# Patient Record
Sex: Male | Born: 1935 | Race: White | Hispanic: No | Marital: Married | Smoking: Former smoker
Health system: Southern US, Community
[De-identification: ages and names within clinical notes are randomized; demographics above are authoritative.]

## PROBLEM LIST (undated history)

## (undated) DIAGNOSIS — I10 Essential (primary) hypertension: Secondary | ICD-10-CM

## (undated) DIAGNOSIS — J449 Chronic obstructive pulmonary disease, unspecified: Secondary | ICD-10-CM

## (undated) DIAGNOSIS — G6181 Chronic inflammatory demyelinating polyneuritis: Secondary | ICD-10-CM

## (undated) DIAGNOSIS — N189 Chronic kidney disease, unspecified: Secondary | ICD-10-CM

## (undated) DIAGNOSIS — K219 Gastro-esophageal reflux disease without esophagitis: Secondary | ICD-10-CM

## (undated) DIAGNOSIS — N200 Calculus of kidney: Secondary | ICD-10-CM

## (undated) DIAGNOSIS — N400 Enlarged prostate without lower urinary tract symptoms: Secondary | ICD-10-CM

## (undated) DIAGNOSIS — J189 Pneumonia, unspecified organism: Secondary | ICD-10-CM

## (undated) DIAGNOSIS — M1990 Unspecified osteoarthritis, unspecified site: Secondary | ICD-10-CM

## (undated) DIAGNOSIS — Z992 Dependence on renal dialysis: Secondary | ICD-10-CM

## (undated) DIAGNOSIS — C801 Malignant (primary) neoplasm, unspecified: Secondary | ICD-10-CM

## (undated) HISTORY — PX: JOINT REPLACEMENT: SHX530

## (undated) HISTORY — PX: ANKLE FUSION: SHX881

## (undated) HISTORY — DX: Chronic kidney disease, unspecified: N18.9

## (undated) HISTORY — DX: Malignant (primary) neoplasm, unspecified: C80.1

## (undated) HISTORY — PX: HIP ARTHROPLASTY: SHX981

## (undated) HISTORY — DX: Unspecified osteoarthritis, unspecified site: M19.90

## (undated) HISTORY — DX: Dependence on renal dialysis: Z99.2

## (undated) HISTORY — DX: Gastro-esophageal reflux disease without esophagitis: K21.9

## (undated) HISTORY — DX: Chronic inflammatory demyelinating polyneuritis: G61.81

## (undated) HISTORY — DX: Chronic obstructive pulmonary disease, unspecified: J44.9

## (undated) HISTORY — DX: Essential (primary) hypertension: I10

## (undated) HISTORY — PX: CHOLECYSTECTOMY: SHX55

## (undated) HISTORY — DX: Benign prostatic hyperplasia without lower urinary tract symptoms: N40.0

---

## 1999-05-11 DIAGNOSIS — C801 Malignant (primary) neoplasm, unspecified: Secondary | ICD-10-CM

## 1999-05-11 HISTORY — PX: NEPHRECTOMY: SHX65

## 1999-05-11 HISTORY — DX: Malignant (primary) neoplasm, unspecified: C80.1

## 2000-06-01 ENCOUNTER — Ambulatory Visit: Admission: RE | Admit: 2000-06-01 | Discharge: 2000-06-01 | Payer: Self-pay | Admitting: Orthopaedic Surgery

## 2000-06-01 ENCOUNTER — Encounter: Payer: Self-pay | Admitting: Orthopaedic Surgery

## 2000-09-12 ENCOUNTER — Encounter (HOSPITAL_COMMUNITY): Admission: RE | Admit: 2000-09-12 | Discharge: 2000-10-12 | Payer: Self-pay | Admitting: Oncology

## 2000-09-12 ENCOUNTER — Encounter: Admission: RE | Admit: 2000-09-12 | Discharge: 2000-09-12 | Payer: Self-pay | Admitting: Oncology

## 2000-10-18 ENCOUNTER — Encounter (HOSPITAL_COMMUNITY): Admission: RE | Admit: 2000-10-18 | Discharge: 2000-11-17 | Payer: Self-pay | Admitting: Oncology

## 2000-10-18 ENCOUNTER — Encounter (HOSPITAL_COMMUNITY): Payer: Self-pay | Admitting: Oncology

## 2000-10-18 ENCOUNTER — Encounter: Admission: RE | Admit: 2000-10-18 | Discharge: 2000-10-18 | Payer: Self-pay | Admitting: Oncology

## 2001-02-27 ENCOUNTER — Encounter: Admission: RE | Admit: 2001-02-27 | Discharge: 2001-02-27 | Payer: Self-pay | Admitting: Oncology

## 2001-05-10 HISTORY — PX: LUNG REMOVAL, PARTIAL: SHX233

## 2001-06-22 ENCOUNTER — Ambulatory Visit (HOSPITAL_COMMUNITY): Admission: RE | Admit: 2001-06-22 | Discharge: 2001-06-22 | Payer: Self-pay | Admitting: Family Medicine

## 2001-06-22 ENCOUNTER — Encounter: Payer: Self-pay | Admitting: Family Medicine

## 2001-06-30 ENCOUNTER — Encounter: Admission: RE | Admit: 2001-06-30 | Discharge: 2001-06-30 | Payer: Self-pay | Admitting: Oncology

## 2001-06-30 ENCOUNTER — Encounter (HOSPITAL_COMMUNITY): Admission: RE | Admit: 2001-06-30 | Discharge: 2001-07-30 | Payer: Self-pay | Admitting: Oncology

## 2001-07-13 ENCOUNTER — Encounter (HOSPITAL_COMMUNITY): Payer: Self-pay | Admitting: Oncology

## 2001-08-04 ENCOUNTER — Encounter: Payer: Self-pay | Admitting: Thoracic Surgery

## 2001-08-04 ENCOUNTER — Inpatient Hospital Stay (HOSPITAL_COMMUNITY): Admission: RE | Admit: 2001-08-04 | Discharge: 2001-08-13 | Payer: Self-pay | Admitting: Thoracic Surgery

## 2001-08-04 ENCOUNTER — Encounter (INDEPENDENT_AMBULATORY_CARE_PROVIDER_SITE_OTHER): Payer: Self-pay | Admitting: *Deleted

## 2001-08-05 ENCOUNTER — Encounter: Payer: Self-pay | Admitting: Thoracic Surgery

## 2001-08-06 ENCOUNTER — Encounter: Payer: Self-pay | Admitting: Thoracic Surgery

## 2001-08-07 ENCOUNTER — Encounter: Payer: Self-pay | Admitting: Thoracic Surgery

## 2001-08-08 ENCOUNTER — Encounter: Payer: Self-pay | Admitting: Thoracic Surgery

## 2001-08-09 ENCOUNTER — Encounter: Payer: Self-pay | Admitting: Thoracic Surgery

## 2001-08-10 ENCOUNTER — Encounter: Payer: Self-pay | Admitting: Thoracic Surgery

## 2001-08-11 ENCOUNTER — Encounter: Payer: Self-pay | Admitting: Thoracic Surgery

## 2001-08-12 ENCOUNTER — Encounter: Payer: Self-pay | Admitting: Thoracic Surgery

## 2001-08-22 ENCOUNTER — Encounter: Admission: RE | Admit: 2001-08-22 | Discharge: 2001-08-22 | Payer: Self-pay | Admitting: Thoracic Surgery

## 2001-08-22 ENCOUNTER — Encounter: Payer: Self-pay | Admitting: Thoracic Surgery

## 2001-09-13 ENCOUNTER — Encounter: Admission: RE | Admit: 2001-09-13 | Discharge: 2001-09-13 | Payer: Self-pay | Admitting: Thoracic Surgery

## 2001-09-13 ENCOUNTER — Encounter: Payer: Self-pay | Admitting: Thoracic Surgery

## 2001-10-20 ENCOUNTER — Encounter: Payer: Self-pay | Admitting: Urology

## 2001-10-20 ENCOUNTER — Ambulatory Visit (HOSPITAL_COMMUNITY): Admission: RE | Admit: 2001-10-20 | Discharge: 2001-10-20 | Payer: Self-pay | Admitting: Urology

## 2001-11-14 ENCOUNTER — Encounter: Admission: RE | Admit: 2001-11-14 | Discharge: 2001-11-14 | Payer: Self-pay | Admitting: Thoracic Surgery

## 2001-11-14 ENCOUNTER — Encounter: Payer: Self-pay | Admitting: Thoracic Surgery

## 2001-11-22 ENCOUNTER — Inpatient Hospital Stay (HOSPITAL_COMMUNITY): Admission: EM | Admit: 2001-11-22 | Discharge: 2001-11-26 | Payer: Self-pay | Admitting: Emergency Medicine

## 2001-11-22 ENCOUNTER — Encounter: Payer: Self-pay | Admitting: Emergency Medicine

## 2001-11-24 ENCOUNTER — Encounter (INDEPENDENT_AMBULATORY_CARE_PROVIDER_SITE_OTHER): Payer: Self-pay | Admitting: *Deleted

## 2001-11-24 ENCOUNTER — Encounter: Payer: Self-pay | Admitting: Internal Medicine

## 2001-11-26 ENCOUNTER — Encounter: Payer: Self-pay | Admitting: Thoracic Surgery

## 2001-12-05 ENCOUNTER — Encounter: Admission: RE | Admit: 2001-12-05 | Discharge: 2001-12-05 | Payer: Self-pay | Admitting: Thoracic Surgery

## 2001-12-05 ENCOUNTER — Encounter: Payer: Self-pay | Admitting: Thoracic Surgery

## 2001-12-19 ENCOUNTER — Encounter: Admission: RE | Admit: 2001-12-19 | Discharge: 2001-12-19 | Payer: Self-pay | Admitting: Oncology

## 2001-12-19 ENCOUNTER — Encounter (HOSPITAL_COMMUNITY): Admission: RE | Admit: 2001-12-19 | Discharge: 2002-01-18 | Payer: Self-pay | Admitting: Oncology

## 2001-12-23 ENCOUNTER — Emergency Department (HOSPITAL_COMMUNITY): Admission: EM | Admit: 2001-12-23 | Discharge: 2001-12-23 | Payer: Self-pay | Admitting: Emergency Medicine

## 2001-12-23 ENCOUNTER — Encounter: Payer: Self-pay | Admitting: Emergency Medicine

## 2001-12-23 ENCOUNTER — Inpatient Hospital Stay (HOSPITAL_COMMUNITY): Admission: EM | Admit: 2001-12-23 | Discharge: 2001-12-28 | Payer: Self-pay | Admitting: *Deleted

## 2002-01-26 ENCOUNTER — Encounter: Admission: RE | Admit: 2002-01-26 | Discharge: 2002-01-26 | Payer: Self-pay | Admitting: Thoracic Surgery

## 2002-01-26 ENCOUNTER — Encounter: Payer: Self-pay | Admitting: Thoracic Surgery

## 2002-04-12 ENCOUNTER — Encounter: Admission: RE | Admit: 2002-04-12 | Discharge: 2002-04-12 | Payer: Self-pay | Admitting: Thoracic Surgery

## 2002-04-12 ENCOUNTER — Encounter: Payer: Self-pay | Admitting: Thoracic Surgery

## 2002-06-20 ENCOUNTER — Encounter (HOSPITAL_COMMUNITY): Admission: RE | Admit: 2002-06-20 | Discharge: 2002-07-20 | Payer: Self-pay | Admitting: Oncology

## 2002-06-20 ENCOUNTER — Encounter: Admission: RE | Admit: 2002-06-20 | Discharge: 2002-06-20 | Payer: Self-pay | Admitting: Oncology

## 2002-07-23 ENCOUNTER — Encounter (HOSPITAL_COMMUNITY): Payer: Self-pay | Admitting: Oncology

## 2002-07-23 ENCOUNTER — Encounter: Admission: RE | Admit: 2002-07-23 | Discharge: 2002-07-23 | Payer: Self-pay | Admitting: Oncology

## 2002-07-23 ENCOUNTER — Encounter (HOSPITAL_COMMUNITY): Admission: RE | Admit: 2002-07-23 | Discharge: 2002-08-22 | Payer: Self-pay | Admitting: Oncology

## 2002-08-10 ENCOUNTER — Encounter: Payer: Self-pay | Admitting: Thoracic Surgery

## 2002-08-10 ENCOUNTER — Encounter: Admission: RE | Admit: 2002-08-10 | Discharge: 2002-08-10 | Payer: Self-pay | Admitting: Thoracic Surgery

## 2002-11-09 ENCOUNTER — Encounter: Payer: Self-pay | Admitting: Thoracic Surgery

## 2002-11-09 ENCOUNTER — Encounter: Admission: RE | Admit: 2002-11-09 | Discharge: 2002-11-09 | Payer: Self-pay | Admitting: Thoracic Surgery

## 2003-01-09 ENCOUNTER — Encounter (HOSPITAL_COMMUNITY): Admission: RE | Admit: 2003-01-09 | Discharge: 2003-02-07 | Payer: Self-pay | Admitting: Oncology

## 2003-01-09 ENCOUNTER — Encounter: Admission: RE | Admit: 2003-01-09 | Discharge: 2003-01-09 | Payer: Self-pay | Admitting: Oncology

## 2003-05-11 HISTORY — PX: COLONOSCOPY: SHX174

## 2003-05-15 ENCOUNTER — Encounter: Admission: RE | Admit: 2003-05-15 | Discharge: 2003-05-15 | Payer: Self-pay | Admitting: Thoracic Surgery

## 2003-07-10 ENCOUNTER — Encounter (HOSPITAL_COMMUNITY): Admission: RE | Admit: 2003-07-10 | Discharge: 2003-08-09 | Payer: Self-pay | Admitting: Oncology

## 2003-07-10 ENCOUNTER — Encounter: Admission: RE | Admit: 2003-07-10 | Discharge: 2003-07-10 | Payer: Self-pay | Admitting: Oncology

## 2003-08-06 ENCOUNTER — Ambulatory Visit (HOSPITAL_COMMUNITY): Admission: RE | Admit: 2003-08-06 | Discharge: 2003-08-06 | Payer: Self-pay | Admitting: Internal Medicine

## 2003-09-17 ENCOUNTER — Encounter: Admission: RE | Admit: 2003-09-17 | Discharge: 2003-09-17 | Payer: Self-pay | Admitting: Thoracic Surgery

## 2003-12-19 ENCOUNTER — Encounter: Admission: RE | Admit: 2003-12-19 | Discharge: 2003-12-19 | Payer: Self-pay | Admitting: Thoracic Surgery

## 2004-06-04 ENCOUNTER — Ambulatory Visit (HOSPITAL_COMMUNITY): Admission: RE | Admit: 2004-06-04 | Discharge: 2004-06-04 | Payer: Self-pay | Admitting: Urology

## 2004-06-16 ENCOUNTER — Encounter: Admission: RE | Admit: 2004-06-16 | Discharge: 2004-06-16 | Payer: Self-pay | Admitting: Thoracic Surgery

## 2004-11-21 ENCOUNTER — Emergency Department (HOSPITAL_COMMUNITY): Admission: EM | Admit: 2004-11-21 | Discharge: 2004-11-21 | Payer: Self-pay | Admitting: Emergency Medicine

## 2004-12-15 ENCOUNTER — Encounter: Admission: RE | Admit: 2004-12-15 | Discharge: 2004-12-15 | Payer: Self-pay | Admitting: Thoracic Surgery

## 2004-12-29 ENCOUNTER — Ambulatory Visit (HOSPITAL_COMMUNITY): Payer: Self-pay | Admitting: Oncology

## 2004-12-29 ENCOUNTER — Encounter: Admission: RE | Admit: 2004-12-29 | Discharge: 2004-12-29 | Payer: Self-pay | Admitting: Oncology

## 2005-01-01 ENCOUNTER — Ambulatory Visit (HOSPITAL_COMMUNITY): Admission: RE | Admit: 2005-01-01 | Discharge: 2005-01-01 | Payer: Self-pay | Admitting: Family Medicine

## 2005-03-22 ENCOUNTER — Ambulatory Visit (HOSPITAL_COMMUNITY): Admission: RE | Admit: 2005-03-22 | Discharge: 2005-03-22 | Payer: Self-pay | Admitting: Urology

## 2005-07-07 ENCOUNTER — Encounter: Admission: RE | Admit: 2005-07-07 | Discharge: 2005-07-07 | Payer: Self-pay | Admitting: Thoracic Surgery

## 2005-10-28 ENCOUNTER — Ambulatory Visit (HOSPITAL_COMMUNITY): Admission: RE | Admit: 2005-10-28 | Discharge: 2005-10-28 | Payer: Self-pay | Admitting: Family Medicine

## 2005-12-29 ENCOUNTER — Encounter: Admission: RE | Admit: 2005-12-29 | Discharge: 2005-12-29 | Payer: Self-pay | Admitting: Oncology

## 2005-12-29 ENCOUNTER — Ambulatory Visit (HOSPITAL_COMMUNITY): Payer: Self-pay | Admitting: Oncology

## 2006-01-05 ENCOUNTER — Encounter: Admission: RE | Admit: 2006-01-05 | Discharge: 2006-01-05 | Payer: Self-pay | Admitting: Thoracic Surgery

## 2006-06-29 ENCOUNTER — Encounter (HOSPITAL_COMMUNITY): Admission: RE | Admit: 2006-06-29 | Discharge: 2006-07-29 | Payer: Self-pay | Admitting: Oncology

## 2006-07-19 ENCOUNTER — Ambulatory Visit: Payer: Self-pay | Admitting: Thoracic Surgery

## 2006-07-19 ENCOUNTER — Encounter: Admission: RE | Admit: 2006-07-19 | Discharge: 2006-07-19 | Payer: Self-pay | Admitting: Thoracic Surgery

## 2006-12-28 ENCOUNTER — Ambulatory Visit (HOSPITAL_COMMUNITY): Payer: Self-pay | Admitting: Oncology

## 2007-02-16 ENCOUNTER — Encounter (HOSPITAL_COMMUNITY): Admission: RE | Admit: 2007-02-16 | Discharge: 2007-03-18 | Payer: Self-pay | Admitting: Oncology

## 2007-07-18 ENCOUNTER — Ambulatory Visit (HOSPITAL_COMMUNITY): Admission: RE | Admit: 2007-07-18 | Discharge: 2007-07-18 | Payer: Self-pay | Admitting: Family Medicine

## 2008-02-23 ENCOUNTER — Ambulatory Visit (HOSPITAL_COMMUNITY): Payer: Self-pay | Admitting: Oncology

## 2008-04-15 ENCOUNTER — Ambulatory Visit (HOSPITAL_COMMUNITY): Payer: Self-pay | Admitting: Oncology

## 2008-04-15 ENCOUNTER — Encounter (HOSPITAL_COMMUNITY): Admission: RE | Admit: 2008-04-15 | Discharge: 2008-05-07 | Payer: Self-pay | Admitting: Oncology

## 2008-07-04 ENCOUNTER — Encounter: Payer: Self-pay | Admitting: Urgent Care

## 2008-11-16 ENCOUNTER — Emergency Department (HOSPITAL_COMMUNITY): Admission: EM | Admit: 2008-11-16 | Discharge: 2008-11-16 | Payer: Self-pay | Admitting: Emergency Medicine

## 2009-01-06 ENCOUNTER — Inpatient Hospital Stay (HOSPITAL_COMMUNITY): Admission: RE | Admit: 2009-01-06 | Discharge: 2009-01-09 | Payer: Self-pay | Admitting: Orthopedic Surgery

## 2009-01-28 ENCOUNTER — Encounter (HOSPITAL_COMMUNITY): Admission: RE | Admit: 2009-01-28 | Discharge: 2009-02-06 | Payer: Self-pay | Admitting: Orthopedic Surgery

## 2009-02-11 ENCOUNTER — Encounter (HOSPITAL_COMMUNITY): Admission: RE | Admit: 2009-02-11 | Discharge: 2009-03-13 | Payer: Self-pay | Admitting: Orthopedic Surgery

## 2009-03-14 ENCOUNTER — Encounter (HOSPITAL_COMMUNITY): Admission: RE | Admit: 2009-03-14 | Discharge: 2009-04-13 | Payer: Self-pay | Admitting: Orthopedic Surgery

## 2009-04-14 ENCOUNTER — Ambulatory Visit (HOSPITAL_COMMUNITY): Payer: Self-pay | Admitting: Oncology

## 2009-09-16 ENCOUNTER — Encounter (HOSPITAL_COMMUNITY): Admission: RE | Admit: 2009-09-16 | Discharge: 2009-10-16 | Payer: Self-pay | Admitting: Oncology

## 2009-09-16 ENCOUNTER — Ambulatory Visit (HOSPITAL_COMMUNITY): Payer: Self-pay | Admitting: Internal Medicine

## 2010-03-23 ENCOUNTER — Ambulatory Visit (HOSPITAL_COMMUNITY): Payer: Self-pay | Admitting: Oncology

## 2010-03-23 ENCOUNTER — Encounter (HOSPITAL_COMMUNITY)
Admission: RE | Admit: 2010-03-23 | Discharge: 2010-04-22 | Payer: Self-pay | Source: Home / Self Care | Attending: Oncology | Admitting: Oncology

## 2010-05-29 ENCOUNTER — Other Ambulatory Visit (HOSPITAL_COMMUNITY): Payer: Self-pay | Admitting: Oncology

## 2010-05-29 DIAGNOSIS — C3490 Malignant neoplasm of unspecified part of unspecified bronchus or lung: Secondary | ICD-10-CM

## 2010-05-30 ENCOUNTER — Encounter: Payer: Self-pay | Admitting: Thoracic Surgery

## 2010-07-21 LAB — PROTEIN ELECTROPHORESIS, SERUM
Gamma Globulin: 16.1 % (ref 11.1–18.8)
M-Spike, %: NOT DETECTED g/dL
Total Protein ELP: 6.4 g/dL (ref 6.0–8.3)

## 2010-07-21 LAB — IMMUNOFIXATION ELECTROPHORESIS
IgA: 250 mg/dL (ref 68–378)
IgG (Immunoglobin G), Serum: 1150 mg/dL (ref 694–1618)
Total Protein ELP: 6.6 g/dL (ref 6.0–8.3)

## 2010-07-21 LAB — PROTEIN, URINE, 24 HOUR
Collection Interval-UPROT: 24 hours
Urine Total Volume-UPROT: 1550 mL

## 2010-07-21 LAB — IMMUNOFIXATION, URINE

## 2010-07-27 LAB — UIFE/LIGHT CHAINS/TP QN, 24-HR UR
Albumin, U: DETECTED
Alpha 1, Urine: DETECTED — AB
Alpha 2, Urine: DETECTED — AB
Beta, Urine: DETECTED — AB
Free Lambda Lt Chains,Ur: 3.29 mg/dL — ABNORMAL HIGH (ref 0.08–1.01)
Total Protein, Urine: 80 mg/dL

## 2010-07-28 LAB — BETA 2 MICROGLOBULIN, SERUM: Beta-2 Microglobulin: 4.87 mg/L — ABNORMAL HIGH (ref 1.01–1.73)

## 2010-07-28 LAB — PROTEIN ELECTROPH W RFLX QUANT IMMUNOGLOBULINS
Albumin ELP: 59 % (ref 55.8–66.1)
Beta 2: 4.4 % (ref 3.2–6.5)
Beta Globulin: 6.5 % (ref 4.7–7.2)
Gamma Globulin: 15.2 % (ref 11.1–18.8)

## 2010-07-28 LAB — COMPREHENSIVE METABOLIC PANEL
AST: 17 U/L (ref 0–37)
Albumin: 3.6 g/dL (ref 3.5–5.2)
BUN: 38 mg/dL — ABNORMAL HIGH (ref 6–23)
Creatinine, Ser: 2.61 mg/dL — ABNORMAL HIGH (ref 0.4–1.5)
GFR calc Af Amer: 29 mL/min — ABNORMAL LOW (ref >60)
Potassium: 4.2 mEq/L (ref 3.5–5.1)
Total Protein: 6.9 g/dL (ref 6.0–8.3)

## 2010-07-28 LAB — IMMUNOFIXATION ELECTROPHORESIS
IgA: 240 mg/dL (ref 68–378)
IgG (Immunoglobin G), Serum: 1060 mg/dL (ref 694–1618)

## 2010-07-28 LAB — IGG, IGA, IGM
IgA: 236 mg/dL (ref 68–378)
IgA: 240 mg/dL (ref 68–378)
IgG (Immunoglobin G), Serum: 1090 mg/dL (ref 694–1618)
IgG (Immunoglobin G), Serum: 1110 mg/dL (ref 694–1618)
IgM, Serum: 85 mg/dL (ref 60–263)
IgM, Serum: 89 mg/dL (ref 60–263)

## 2010-07-28 LAB — IMMUNOFIXATION ADD-ON

## 2010-08-14 LAB — CBC
HCT: 24.4 % — ABNORMAL LOW (ref 39.0–52.0)
MCHC: 34.3 g/dL (ref 30.0–36.0)
MCHC: 34.6 g/dL (ref 30.0–36.0)
MCV: 91.5 fL (ref 78.0–100.0)
Platelets: 176 10*3/uL (ref 150–400)
Platelets: 177 10*3/uL (ref 150–400)
RBC: 2.71 MIL/uL — ABNORMAL LOW (ref 4.22–5.81)
WBC: 11 10*3/uL — ABNORMAL HIGH (ref 4.0–10.5)

## 2010-08-14 LAB — BASIC METABOLIC PANEL
BUN: 31 mg/dL — ABNORMAL HIGH (ref 6–23)
BUN: 32 mg/dL — ABNORMAL HIGH (ref 6–23)
Calcium: 8 mg/dL — ABNORMAL LOW (ref 8.4–10.5)
Creatinine, Ser: 2.77 mg/dL — ABNORMAL HIGH (ref 0.4–1.5)
Creatinine, Ser: 2.79 mg/dL — ABNORMAL HIGH (ref 0.4–1.5)
GFR calc Af Amer: 27 mL/min — ABNORMAL LOW (ref >60)
GFR calc Af Amer: 27 mL/min — ABNORMAL LOW (ref >60)
Potassium: 4.4 mEq/L (ref 3.5–5.1)
Sodium: 132 mEq/L — ABNORMAL LOW (ref 135–145)

## 2010-08-15 LAB — CROSSMATCH: Antibody Screen: NEGATIVE

## 2010-08-15 LAB — URINALYSIS, ROUTINE W REFLEX MICROSCOPIC
Bilirubin Urine: NEGATIVE
Glucose, UA: NEGATIVE mg/dL
Hgb urine dipstick: NEGATIVE
Ketones, ur: NEGATIVE mg/dL
Leukocytes, UA: NEGATIVE
Protein, ur: 100 mg/dL — AB
pH: 5 (ref 5.0–8.0)

## 2010-08-15 LAB — COMPREHENSIVE METABOLIC PANEL
AST: 27 U/L (ref 0–37)
Albumin: 3.8 g/dL (ref 3.5–5.2)
Alkaline Phosphatase: 72 U/L (ref 39–117)
CO2: 24 mEq/L (ref 19–32)
Chloride: 104 mEq/L (ref 96–112)
Creatinine, Ser: 2.31 mg/dL — ABNORMAL HIGH (ref 0.4–1.5)
GFR calc Af Amer: 33 mL/min — ABNORMAL LOW (ref >60)
GFR calc non Af Amer: 28 mL/min — ABNORMAL LOW (ref >60)
Potassium: 4.9 mEq/L (ref 3.5–5.1)
Total Bilirubin: 1 mg/dL (ref 0.3–1.2)

## 2010-08-15 LAB — URINE CULTURE
Colony Count: NO GROWTH
Culture: NO GROWTH

## 2010-08-15 LAB — URINE MICROSCOPIC-ADD ON

## 2010-08-15 LAB — DIFFERENTIAL
Basophils Absolute: 0.1 10*3/uL (ref 0.0–0.1)
Basophils Relative: 1 % (ref 0–1)
Eosinophils Absolute: 0.1 10*3/uL (ref 0.0–0.7)
Eosinophils Relative: 1 % (ref 0–5)
Lymphocytes Relative: 9 % — ABNORMAL LOW (ref 12–46)
Monocytes Absolute: 0.6 10*3/uL (ref 0.1–1.0)

## 2010-08-15 LAB — BASIC METABOLIC PANEL
Chloride: 104 mEq/L (ref 96–112)
GFR calc non Af Amer: 25 mL/min — ABNORMAL LOW (ref >60)
Glucose, Bld: 124 mg/dL — ABNORMAL HIGH (ref 70–99)
Potassium: 4.6 mEq/L (ref 3.5–5.1)
Sodium: 131 mEq/L — ABNORMAL LOW (ref 135–145)

## 2010-08-15 LAB — CBC
HCT: 28.6 % — ABNORMAL LOW (ref 39.0–52.0)
HCT: 38.8 % — ABNORMAL LOW (ref 39.0–52.0)
Hemoglobin: 9.7 g/dL — ABNORMAL LOW (ref 13.0–17.0)
MCV: 91.3 fL (ref 78.0–100.0)
MCV: 91.9 fL (ref 78.0–100.0)
RBC: 4.26 MIL/uL (ref 4.22–5.81)
WBC: 11.7 10*3/uL — ABNORMAL HIGH (ref 4.0–10.5)
WBC: 7.8 10*3/uL (ref 4.0–10.5)

## 2010-08-15 LAB — PROTIME-INR: Prothrombin Time: 13 seconds (ref 11.6–15.2)

## 2010-08-15 LAB — ABO/RH: ABO/RH(D): A NEG

## 2010-09-22 NOTE — Op Note (Signed)
[name], [name] NO.:  [id number]   MEDICAL RECORD NO.:  [id number]          PATIENT TYPE:  INP   LOCATION:  5032                         FACILITY:  [facility]   PHYSICIAN:  Estill Bamberg. Ronnie Derby, M.D. DATE OF BIRTH:  July 09, 1930   DATE OF PROCEDURE:  01/06/2009  DATE OF DISCHARGE:                               OPERATIVE REPORT   SURGEON:  Estill Bamberg. Ronnie Derby, MD   ASSISTANT:  1. Carlynn Spry, PA-C  2. Lowell Guitar. Mercie Eon.   ANESTHESIA:  General.   PREOPERATIVE DIAGNOSIS:  Left knee osteoarthritis.   POSTOPERATIVE DIAGNOSIS:  Left knee osteoarthritis.   PROCEDURE:  Left total knee arthroplasty.   INDICATIONS FOR PROCEDURE:  The patient is a 77 year old white male with  failure of conservative measures for osteoarthritis of the knee.  Informed consent was obtained.   DESCRIPTION OF PROCEDURE:  The patient was laid supine, administered  general anesthesia.  Foley catheter was placed.  The left leg was  prepped and draped in the usual sterile fashion.  A midline incision was  made with a #10 blade.  A new blade was used to make a median  parapatellar arthrotomy and performed a synovectomy.  I then everted the  patella, it measured 35-mm in width and 24-mm in thickness.  I reamed  down 9-mm, drilled through the lug holes with the prosthetic trial in  place, it recreated 25-mm thickness.  I removed the block and went into  flexion.  I then elevated the deep MCL off the medial crest of the  tibia.  I then cut the ACL and the PCL.  I then used extramedullary  alignment system on the tibia to make a perpendicular cut and 3 degrees  posterior slope.  I removed the cut surface of the bone and the guide.  I then made an intramedullary drill hole.  I used an intramedullary  guide to obtain a 6-degree valgus cut.  I then used a 4-in-1 cutting  block, measured to a size F, pinned it in place, made an anteroposterior  chamfer cut.  I then placed a lamina spreader in the knee  and removed  the ACL, PCL, medial and lateral menisci, and posterior condylar  osteophytes.  There was completely worn medial tibial plateau, I knew I  would have to cut a notch.  So, I have tried to obtain ligamentous  balance with a 10-mm spacer.  I then used the intramedullary guide to  cut a medial wedge.  I then fashioned a stubby stem on the revision  tibia with a medial wedge and then trialed that with the F femur and 10  insert, and I had a good flexion-extension gap balance, though a little  tight on the medial side.  I then released further medially, and I had  to go to a 14 insert and that solved the problem well.  I had excellent  flexion-extension gap balance.  I then removed the trial components,  copiously irrigated.  I cemented in the components removing all excess  cement and allowing the cement to harden in extension.  I then closed  the arthrotomy with figure-of-eight #1 Vicryl sutures, deep soft tissues  buried with 0 Vicryl sutures, and the subcuticular with 2-0 Vicryl  stitch.  I placed a Hemovac coming out superolaterally deep to the  arthrotomy, pain catheter coming out superomedial and superficial to the  arthrotomy.   COMPLICATIONS:  None.   DRAINS:  One Hemovac, one pain catheter.   ESTIMATED BLOOD LOSS:  300 mL.           ______________________________  Estill Bamberg. Ronnie Derby, M.D.     SDL/MEDQ  D:  01/07/2009  T:  01/08/2009  Job:  [id number]

## 2010-09-25 NOTE — Op Note (Signed)
NAME:  [name], [name]                          ACCOUNT NO.:  [id number]   MEDICAL RECORD NO.:  [id number]                   PATIENT TYPE:  AMB   LOCATION:  DAY                                  FACILITY:  [facility]   PHYSICIAN:  Hildred Laser, M.D.                 DATE OF BIRTH:  06-May-1931   DATE OF PROCEDURE:  08/06/2003  DATE OF DISCHARGE:                                 OPERATIVE REPORT   PROCEDURE:  Colonoscopy.   INDICATIONS FOR PROCEDURE:  [name] is a 77 year old Caucasian male who is  undergoing screening colonoscopy.  He does not have any GI symptoms.  Family  history is negative for colorectal carcinoma, but personal history is  significant for renal and primary lung carcinoma for which he underwent a  right nephrectomy and left pneumonectomy and remains in remission.  The  procedure and risks were reviewed with the patient, and informed consent was  obtained.   PREOPERATIVE MEDICATIONS:  Demerol 50 mg IV, Versed 3 mg IV.   FINDINGS:  The procedure was performed in the endoscopy suite.  The  patient's vital signs and O2 saturations were monitored during the procedure  and remained stable.  The patient was placed in the left lateral recumbent  position and rectal examination performed.  No abnormality noted on external  or digital exam.  The Olympus videoscope was placed into the rectum and  advanced into the region of the sigmoid colon and beyond.  The preparation  was satisfactory.  He had diverticula scattered throughout his colon, but  most of these were in the sigmoid and descending colon.  The scope was  passed to the cecum which was identified by the appendiceal orifice and  ileocecal valve.  Pictures were taken for the record.  As the scope was  withdrawn, the colonic mucosa was carefully examined.  There were two small  polyps, one at hepatic flexure which was ablated by cold biopsy.  The other  polyp was at the sigmoid colon and was also ablated by cold biopsy.   These  two polyps were submitted separately.  The rectal mucosa was normal.  The  scope was retroflexed to examine the anorectal junction, and moderate-size  hemorrhoids were noted below the dentate line.  The endoscope was  straightened and withdrawn.  The patient tolerated the procedure well.   FINAL DIAGNOSES:  1. Pancolonic diverticulosis.  2. Two small polyps which were ablated by cold biopsy, one from hepatic     flexure and another one from the proximal sigmoid colon.  3. External hemorrhoids.   RECOMMENDATIONS:  1. Standard instructions given.  2. High fiber diet.  3. Citrucel or equivalent, one tablespoon full daily.  4. I will be contacting the patient with the biopsy results and further     recommendations.      ___________________________________________  Hildred Laser, M.D.   NR/MEDQ  D:  08/06/2003  T:  08/06/2003  Job:  [id number]   cc:   Gaston Islam. Neijstrom, MD  [address]  [place]  [place] [address]  Fax: [phone]   Bonne Dolores, M.D.  [address] [place]  [place] [address]  Fax: [phone]

## 2010-09-25 NOTE — Discharge Summary (Signed)
[place]  Patient:    [name], [name] Visit Number: [id number] MRN: [id number]          Service Type: SUR Location: 3A A341 01 Attending Physician:  Glo Herring Dictated by:   Lestine Box, RNFA Admit Date:  11/22/2001 Discharge Date: 11/26/2001   CC:         [name]. [name], M.D.  Primus Bravo, M.D.   Discharge Summary  DATE OF BIRTH: 12/05/1930  ADMISSION DIAGNOSIS: Hemoptysis.  PAST MEDICAL HISTORY: 1. Left lung carcinoma, status post left lower lobe resection in March of 2003    by [name]. 2. Renal cell carcinoma of the right kidney, status post nephrectomy in April    of 2001. 3. Osteoarthritis of the knees and ankles. 4. History of inflammatory demyelinating polyneuropathy in 1992. 5. Benign prostatic hypertrophy. 6. Hypertension. 7. Gastroesophageal reflux disease. 8. History of nephrolithiasis of the left kidney.  PAST SURGICAL HISTORY: 1. Fusion of right ankle by Dr. Beckey Downing in October of 2002. 2. Right knee surgery with tendon repair in 1963.  ALLERGIES: PENICILLIN.  DISCHARGE DIAGNOSES: Hemoptysis probably related to bronchitis.  HISTORY OF PRESENT ILLNESS: [name] is a 77 year old Caucasian male who was recovering nicely from his lung surgery in March. He was seen at the [facility] ten days prior to admission and had a chest x-ray which showed only postoperative changes. He then had an episode of severe reflux and two days later, developed hemoptysis. He sought medical attention at [facility]. They admitted him and performed CT scan. The CT scan revealed a lesion which was suspicious for a lung abscess. He was transferred to [facility] for further evaluation and treatment.  HOSPITAL COURSE: On November 24, 2001 [name] was accepted in transfer from [facility] to [facility] to the care of [name]. [name]. On November 25, 2001 he underwent flexible  operative bronchoscopy. Findings of bronchoscopy were negative for any evidence of abscess or active bleeding. It was [name] impression that his hemoptysis was related to bronchitis. On November 26, 2001, hospital day two, [name] was feeling very well. His vital signs were stable and he was afebrile.  LABORATORY STUDIES: WBC 9.4, hemoglobin and hematocrit 14 and 42, platelets 250. Sodium 138, potassium 4.2, chloride 106, CO2 24, BUN 23, creatinine 2.1, glucose 101.  DIAGNOSTIC STUDIES: Chest x-ray on November 26, 2001 revealed postoperative changes of the left hemithorax as above. No active disease.  DISCHARGE DISPOSITION: [name] feels very well. Only complaint is some rib soreness from coughing. His lung sounds are clear to auscultation and percussion bilaterally.  His breathing is unlabored. [name] is ready for discharge to home. He will continue p.o. antibiotics for the next week.  DISCHARGE CONDITION: Improved.  DISCHARGE INSTRUCTIONS:  MEDICATIONS: 1. Tequin 400 mg one p.o. times seven days. 2. Prednisone 10 mg p.o. q.o.d. 3. Cardura 10 mg p.o. q.d. 4. Aciphex one p.o. q.d.  ACTIVITY: As tolerated.  DIET: Low fat, low salt.  FOLLOW-UP: With [name] in approximately one week. The office will call for a date and time for this appointment. He will be asked to get a chest x-ray one hour before this appointment. Dictated by:   Lestine Box, RNFA Attending Physician:  Glo Herring DD:  11/26/01 TD:  12/01/01 Job: [id number]

## 2010-09-25 NOTE — Discharge Summary (Signed)
[facility]  Patient:    [name], [name] Visit Number: [id number] MRN: [id number]          Service Type: MED Location: 3A A341 01 Attending Physician:  Shaune Pollack Dictated by:   Sinda Du, M.D. Admit Date:  11/22/2001                             Discharge Summary  I have discussed [name] with Dr. Orson Ape and subsequently with Dr. Arlyce Dice. [name] had an x-ray about 10 days ago which did not show changes similar to what we are seeing on the x-ray here and since he has recently had thoracic surgery, I discussed the situation with Dr. Baldemar Friday who did his surgery and Dr. Arlyce Dice agrees to take him in transfer for further evaluation. Dictated by:   Sinda Du, M.D. Attending Physician:  Shaune Pollack DD:  11/24/01 TD:  11/24/01 Job: [id number]

## 2010-09-25 NOTE — Procedures (Signed)
[place]  Patient:    [name], [name] Visit Number: [id number] MRN: [id number]          Service Type: MED Location: 2000 2006 01 Attending Physician:  Mollie Germany Dictated by:   D. Marlyn Corporal, M.D. Admit Date:  11/24/2001 Discharge Date: 11/26/2001                             Procedure Report  PREOPERATIVE DIAGNOSIS:  Hemoptysis, status post left upper lobectomy.  POSTOPERATIVE DIAGNOSIS:  Hemoptysis, status post left upper lobectomy.  OPERATION PERFORMED:  Fiberoptic bronchoscopy.  SURGEON:  D. Marlyn Corporal, M.D.  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  Did well until a couple of weeks ago when he had an episode of reflux and then developed some hemoptysis.  He was admitted.  A CT scan showed a medial questionable abscess.  He was afebrile. Because of his hemoptysis, he is brought to the operating room for fiberoptic bronchoscopy.  DESCRIPTION OF PROCEDURE:  After local anesthesia with Cetacaine, Xylocaine, and IV sedation, the fiberoptic bronchoscope was passed through the mouth, and scored through the midline.  The carina was in the midline.  The left main stem, left upper lobe, and left lower lobe orifices were normal.  On the right side, the right main stem, right upper lobe, and right lower lobe orifices were normal.  We examined on the left side with the bronchial stump of the left upper lobe was normal.  There was some mild bronchitis, but no evidence of any infection or tumor with this bronchoscopy.  The fiberoptic bronchoscope was removed after cultures and brushings for cytology were done.  The patient was returned to recovery in stable condition. Dictated by:   D. Marlyn Corporal, M.D. Attending Physician:  Mollie Germany DD:  11/25/01 TD:  11/29/01 Job: [id number]

## 2010-09-25 NOTE — Consult Note (Signed)
[facility]  Patient:    JORELL, GIBNEY Visit Number: [id number] MRN: [id number]          Service Type: MED Location: 2000 2006 01 Attending Physician:  Mollie Germany Dictated by:   Neil Crouch, P.A.-C. Proc. Date: 11/22/01 Admit Date:  11/24/2001 Discharge Date: 11/26/2001   CC:         Redmond School, M.D.  Hildred Laser, M.D.   Consultation Report  REFERRING PHYSICIAN:  Redmond School, M.D.  REASON FOR CONSULTATION:  "Reflux."  HISTORY OF PRESENT ILLNESS:  The patient is a pleasant 77 year old gentleman with past medical history significant for right renal cell carcinoma, status post right nephrectomy in April of 2001, left lung adenocarcinoma status post VATs with left upper lobectomy in March of 2003 by Dr. Pierre Bali, gastroesophageal reflux disease, inflammatory demyelinating polyneuropathy. He was admitted with diagnosis of cavitary lung mass with hemoptysis.  We were asked to see the patient with regards to reflux.  The patient tells me that he developed small volume hemoptysis on November 15, 2001.  This has progressively worsened and yesterday he coughed up a large amount of red blood mixed with clots.  Today, he continues to cough up some black material.  He was recently seen by Dr. Keturah Barre. Marlyn Corporal about a month ago and he tells me he had a clean bill of health.  He has noted some increase shortness of breath over the past couple of weeks.  He denies any chest pain, weight loss, fever, or chills.  He has a longstanding history of gastroesophageal reflux disease and has been on PPI therapy for the last two to three years.  He is on Aciphex 20 mg daily and rarely has any breakthrough heartburn symptoms.  He notes one evening last week, however, that he woke up with some heartburn and tells me that he "possibly refluxed blood as well as coughed up blood."  He denies any nausea, vomiting, abdominal pain, melena, rectal  bleeding, or weight loss.  He denies any NSAID use.  He was seen in Dr. Purcell Nails Fuscos office yesterday for this and it was arranged to have a chest CT scan done. According to his note, he apparently had a cavitary lung mass seen on CT.  At this time, I do not see a report of this CT scan of the chest in the chart.  LABORATORY DATA:  On admission, his hemoglobin was 14.8, hematocrit 42.3, WBC 8.3, with 88% neutrophils, 7% lymphocytes, platelets 265,000.  PT 12.5, INR 1, creatinine 1.9, BUN 21.  Today, his hemoglobin is 14.7, hematocrit 42.3.  MEDICATIONS: 1. Cardura 2 mg q.d. 2. Prednisone 10 mg q.o.d. 3. Aciphex 20 mg q.d.  CURRENT MEDICATIONS: 1. Cleocin 150 mg IV q.6h. 2. Levaquin 500 mg IV/PV q.d. 3. Aciphex 20 mg q.d.  ALLERGIES:  No known drug allergies.  PAST MEDICAL HISTORY:  History of right renal cell carcinoma, status post right nephrectomy in April of 2001.  Diagnosed with left lung lesion in March of 2003.  Underwent VATs with left upper lobectomy by Dr. Pierre Bali. It revealed adenocarcinoma according to his note.  History of osteoarthritis. History of inflammatory demyelinating polyneuropathy diagnosed in 1992. Benign prostatic hypertrophy.  Hypertension.  Gastroesophageal reflux disease. History of left kidney stone with hydronephrosis and acute renal failure, required stent placement.  He had a cholecystectomy at the time of right nephrectomy for cholecystitis.  Right ankle fusion by Dr. Beckey Downing at [facility] in October  2002.  Right knee surgery with tendon repair in 1963.  The patient has had previous colonoscopies by Dr. Hildred Laser. Results unavailable to me at this time.  FAMILY HISTORY:  No family history of colorectal cancer, stomach cancer, or chronic GI illnesses.  He had a cousin who died of lung cancer.  His father died of a MI at age 58.  SOCIAL HISTORY:  He is married and has no children.  He is retired.  He quit smoking in  1978 after smoking for 38 years.  No alcohol use.  REVIEW OF SYSTEMS:  Please see HPI for GI and pulmonary and general.  Also complains of occasional intermittent diarrhea.  PHYSICAL EXAMINATION:  VITAL SIGNS:  Current is 95.5 (temperature maximum 97.3), pulse 54, respirations 20, blood pressure 160/80.  Height 5 foot 7 inches.  Weight 180 pounds.  GENERAL:  A very pleasant well-developed, well-nourished Caucasian male in no acute distress.  SKIN:  Warm and dry.  No jaundice.  HEENT:  Conjunctivae are pink.  Sclerae are nonicteric.  Oropharyngeal mucosa are moist and pink.  No lesions, erythema, or exudate.  NECK:  No lymphadenopathy, thyromegaly, or carotid bruits.  CHEST:  Lungs reveal diffuse occasional rhonchi.  Good air movement.  CARDIOVASCULAR:  Regular rate and rhythm.  Normal S1 and S2.  No murmurs, rubs, or gallops.  ABDOMEN:  Positive bowel sounds.  Protuberant but symmetrical.  Soft and nontender.  No organomegaly or masses appreciated.  EXTREMITIES:  No cyanosis or edema.  LABORATORY DATA:  As mentioned in HPI.  In addition, sodium 137, potassium 4, BUN 20, creatinine 1.9, glucose 110, amylase 85, lipase 39.  H. pylori pending.  PTT 26.  IMPRESSION:  The patient is a pleasant 77 year old gentleman with past medical history significant for left lung adenocarcinoma, status post video-assisted thoracoscopy with left upper lobectomy by Dr. Pierre Bali in March 2003. He presents now with hemoptysis and reported cavitary lung lesion by CT which was done yesterday.  At this time, the patient does not appear to have any acute GI disease.  He tells me that he has well controlled gastroesophageal reflux disease on Aciphex.  Otherwise, his gastrointestinal review of systems is negative.  I do not suspect that we are dealing with a hematemesis.  RECOMMENDATIONS:  1. Further evaluation of lung lesion as per medicine, pulmonary service. 2. Continue PPI therapy. 3.  If he develops hematemesis or gross melena, would consider EGD at    that point.  DISPOSITION:  I would like to thank Dr. Redmond School for allowing [place] to take part in the care of this patient. Dictated by:   Neil Crouch, P.A.-C. Attending Physician:  Mollie Germany DD:  11/23/01 TD:  11/26/01 Job: [id number]
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
## 2010-09-25 NOTE — H&P (Signed)
[place]  Patient:    [name], [name] Visit Number: [id number] MRN: [id number]          Service Type: REC Location: [facility] Attending Physician:  Burnett Kanaris Dictated by:   Vernard Gambles, P.A. Admit Date:  06/30/2001 Discharge Date: 07/30/2001                           History and Physical  DATE OF BIRTH: 1931/02/25  CHIEF COMPLAINT: Left lung lesion.  HISTORY OF PRESENT ILLNESS: This is a 77 year old Caucasian male, who is referred by Dr. Tressie Stalker for evaluation of a left lung lesion.  He was having a routine CT scan for follow-up of renal cell carcinoma.  The CT of the chest revealed a left lower lobe lesion on July 13, 2001.  The patient is asymptomatic and was referred for surgical consultation.  He denies any cough or sputum production, any fever, chills, or unexplained weight loss.  He does have a history of gastroesophageal reflux disease.  He denies any shortness of breath or dyspnea on exertion.  He denies any paroxysmal nocturnal dyspnea, angina, arrhythmias, or chest discomfort.  He denies any peripheral edema, hemoptysis, symptoms of TIA or CVA, denies any amaurosis fugax, any history of pneumonia or history of pulmonary embolus or deep vein thrombosis.  He just got over a recent bout of bronchitis in February 2003 and now he is okay.  PAST MEDICAL HISTORY:  1. History of renal cell carcinoma of the right kidney, status post     nephrectomy in April 2001.  2. Osteoarthritis of the knees and ankles.  3. History of inflammatory demyelinating polyneuropathy in 1992.  4. Benign prostatic hypertrophy.  5. Hypertension.  6. Gastroesophageal reflux disease.  7. History of nephrolithiasis, left kidney.  PAST SURGICAL HISTORY:  1. Right nephrectomy with cholecystectomy and April 2001.  2. Fusion of right ankle by Dr. Beckey Downing at [facility] in October 2002.  3. Right knee surgery with tendon repair in  1963.  MEDICATIONS:  1. Prednisone 10 mg p.o. every other day.  2. Doxazosin 2 mg p.o. q.d.  3. Aciphex 20 mg p.o. every other day.  ALLERGIES: NKDA.  REVIEW OF SYSTEMS: Please see HPI for significant positives.  Otherwise, the patient denies any history of diabetes mellitus.  He did have a history of kidney trouble after his kidney stone of the left kidney.  Otherwise please see HPI.  FAMILY HISTORY: Mother is deceased at 34 from "old age."  Father is deceased at 21 from myocardial infarction.  Sister is alive with diet continued diabetes mellitus.  One brother deceased from motor vehicle accident.  SOCIAL HISTORY: The patient is married, has no children.  He is retired from [employer] in [place], [place].  Denies any alcohol use. Quit smoking in 1978 after smoking a pack per day for 38 years.  PHYSICAL EXAMINATION:  VITAL SIGNS: Blood pressure 138/86, pulse 88, respirations 18.  GENERAL: This is a 77 year old Caucasian male who is in no acute distress, alert and oriented x3.  HEENT: Head normocephalic, atraumatic.  PERRLA.  EOMI.  Without cataracts, glaucoma, or macular degeneration.  He does wear dentures.  NECK: Supple without JVD, bruits, or lymphadenopathy.  CHEST: Symmetrical on inspiration without wheezes, rhonchi, rales, or lymphadenopathy.  HEART: Regular rate and rhythm without murmurs, rubs, or gallops.  ABDOMEN: Soft, nontender.  Bowel sounds x4 quadrants without masses or bruits. Right  nephrectomy well-healed incision noted on his abdomen.  GU/RECTAL: Deferred.  EXTREMITIES: Without clubbing, cyanosis, or edema or ulcerations.  The right knee demonstrates a well-healed incision and the right ankle demonstrates a well-healed incision.  Temperature warm bilaterally.  He has 2+ carotid, femoral, popliteal, and pedal pulses bilaterally.  NEUROLOGIC: No focal deficits.  His gait is steady.  He walks with a cane. Deep tendon reflexes are  2+ and muscle strength is 2+ bilaterally.  ASSESSMENT: Left lung lesion.  PLAN: The patient is to undergo left VATS/thoracotomy/wedge resection and possible lobectomy at [facility] on August 04, 2001.  Dr. Arlyce Dice has seen and evaluated the patient prior to this admission and has explained the risks and benefits and the procedure, and the patient has agreed to continue.  Dictated by:   Vernard Gambles, P.A. Attending Physician:  Burnett Kanaris DD:  08/01/01 TD:  08/02/01 Job: [id number]
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
## 2010-09-25 NOTE — Discharge Summary (Signed)
. [facility]  Patient:    [name], [name] Visit Number: [id number] MRN: [id number]          Service Type: SUR Location: [id number] Attending Physician:  Mollie Germany Dictated by:   Lestine Box, RNFA Admit Date:  08/04/2001 Discharge Date: 08/13/2001   CC:         Everardo All, M.D.  Bonne Dolores, M.D.   Discharge Summary  DATE OF BIRTH: 02-Nov-1930.  ADMISSION DIAGNOSIS: Left upper lobe lung nodule.  PAST MEDICAL HISTORY: 1. Renal cell carcinoma, status post right nephrectomy, April 2001.    Cholecystectomy at this time also. 2. Demyelinating polyneuropathy, diagnosed in 1992. 3. History of left nephrolithiasis. 4. Osteoarthritis of the knees and ankles. 5. Benign prosthetic hypertrophy. 6. Hypertension. 7. Gastroesophageal reflux disease. 8. Status post right ankle fusion in October 2002 and right knee surgery    tendon repair in 1963.  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 After his nephrectomy, he developed left nephrolithiasis which left him with some renal insufficiency with a baseline creatinine of 2.2. During follow-up by Dr. Tressie Stalker, he was found to have a left upper lobe lung lesion. This was initially followed by CT scan but was found to be enlarging.  Dr. Tressie Stalker referred [name] to Dr. Arlyce Dice and he was evaluated in the [facility] office on August 01, 2001. After examination of the patient and review of the records including review of CT scans, Dr. Arlyce Dice recommended surgical resection of this lesion. The procedure was then discussed with the patient and he agreed to proceed.  PREOPERATIVE PULMONARY FUNCTION TESTS: SVC 3.67, FEV1 2.35.  HOSPITAL COURSE: On August 04, 2001 [name] was electively admitted to [facility] in the care of D. Marlyn Corporal, M.D. He underwent the following  surgical procedure. Left video assisted thoracoscopy, left upper lobectomy with multiple node biopsies.  Intraoperative frozen section pathology returned as adenocarcinoma. He tolerated this procedure well and was transferred in stable condition to the PACU. [name] has remained hemodynamically stable since surgery and his postoperatively course is remarkable for a persistent small air leak. He was switched from wall suction to pneumostat on August 09, 2001. His chest tube was able to be discontinued on August 11, 2001. Chest x-ray on the morning of August 12, 2001 was acceptable.  FINAL PATHOLOGY: Was returned as well to moderately differentiated adenocarcinoma, bronchial alveolar type. His lymph nodes were negative for tumor or metastatic changes.  DISPOSITION: [name] is making very good progress and recovering from his surgery on the morning of August 13, 2001. His vital signs are stable. He is afebrile. His labs were within normal limits. His lungs are clear. His appetite is improving as is his ambulation. His incisions are healing well and his pain is adequately controlled. He is ready for discharge today.  LABORATORY: On August 13, 2001 revealed potassium of 4.6, sodium 134, BUN 33, creatinine 1.9.  DISCHARGE CONDITION:  Improved.  ACTIVITY: He has been asked to continue his breathing exercises and daily walks. He is to refrain from driving and no heavy lifting right now.  DIET: Low fat, low salt.  WOUND CARE: He may shower at home. He has been asked to look at his incision daily. He is to call Dr. Rosaland Lao office if his incisions are red, hot, swollen, draining, or if he has a fever  over 101 degrees.  DISCHARGE MEDICATIONS: Tylox 1-2 p.o. q.4-6h. p.r.n. pain. He has been instructed to resume his home medications of prednisone 10 mg p.o. q.o.d; Doxazosin 2 mg p.o. q.d.; Aciphex 20 mg p.o. q.d.  FOLLOW-UP: He will have an appointment to see Dr. Arlyce Dice in the CVTS office  in approximately one week. The office will call with a date and time for that appointment. He will be asked to get a chest x-ray approximately one hour before that appointment. Dictated by:   Lestine Box, RNFA Attending Physician:  Mollie Germany DD:  08/13/01 TD:  08/14/01 Job: [id number]

## 2010-09-25 NOTE — Discharge Summary (Signed)
   NAME:  [name], [name]                          ACCOUNT NO.:  [id number]   MEDICAL RECORD NO.:  [id number]                   PATIENT TYPE:  INP   LOCATION:  2006                                 FACILITY:  [facility]   PHYSICIAN:  Sherrilee Gilles. Gerarda Fraction, M.D.             DATE OF BIRTH:  1930/10/21   DATE OF ADMISSION:  11/22/2001  DATE OF DISCHARGE:  11/26/2001                                 DISCHARGE SUMMARY   DISCHARGE DIAGNOSES:  1. Hemoptysis.  2. History of renal malignancy.  3. Bronchogenic malignancy.  4. Hypertension.   DISCHARGE MEDICATIONS:  1. Levaquin 500 mg p.o. q.d.  2. Aciphex 20 mg p.o. q.d.  3. Cleocin 300 mg q.i.d.  4. INH 300 mg p.o. q.d.  5. Rifampin 600 mg p.o. q.d.  6. Ethambutol 1200 mg p.o. q.d.  7. Pyrazinamide 500 mg t.i.d.  8. Pyridoxine 50 mg p.o. q.d.  9. Cardura 2 mg p.o. q.d.   SUMMARY:  The patient was admitted with hemoptysis, found to have a lung  lesion that was of concern for active tuberculosis versus a bronchogenic  carcinoma.  Pulmonary and GI consultants were consulted.  He was recently  seen by Dr. Arlyce Dice about a month prior to his admission and given a clean  bill of health status post lung adenocarcinoma status post VATS with left  upper lobectomy resection.  In any case, his acid fast studies were  unrevealing.  He was admitted to isolation.  A discussion ensued between  pulmonary and cardiothoracic surgery and the patient was agreed to be seen  in transfer from the hospital by Dr. Arlyce Dice.                                               Sherrilee Gilles. Gerarda Fraction, M.D.    LJF/MEDQ  D:  01/04/2002  T:  01/05/2002  Job:  [id number]
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
## 2010-09-25 NOTE — Op Note (Signed)
[place]  Patient:    [name], [name] Visit Number: [id number] MRN: [id number]          Service Type: SUR Location: MICU 2112 01 Attending Physician:  Mollie Germany Dictated by:   D. Marlyn Corporal, M.D. Proc. Date: 08/04/01 Admit Date:  08/04/2001   CC:         Gaston Islam. Tressie Stalker, M.D.   Operative Report  PREOPERATIVE DIAGNOSIS: Left upper lobe lesion.  POSTOPERATIVE DIAGNOSIS: Adenocarcinoma of left upper lobe.  OPERATION/PROCEDURE: Left video assisted thoracoscopy with left upper lobectomy.  SURGEON: D. Marlyn Corporal, M.D.  ANESTHESIA: General.  INDICATIONS FOR PROCEDURE: This patient had a previous nephrectomy for renal cell cancer and was followed and found to have a left upper lobe lesion that was slightly increased in size.  It was a highly irregular lesion.  He is brought to the operating room because of the possibility this was lung cancer.  DESCRIPTION OF PROCEDURE: He was turned to the left lateral thoracotomy position and prepped and draped in the usual sterile manner.  A dual lumen tube was inserted.  Two trocar sites were inserted in the anterior and posterior axillary line at the seventh intercostal space.  Two trocars were inserted and a 30 degree scope inserted.  Exploration was carried out and the lesions could be seen in the lingular segment of the left upper lobe, and there was another lesion in the apical segment of the left upper lobe.  A 4 cm incision was made over the fifth intercostal space and dissection was carried down, partially dividing latissimus and reflecting serratus anteriorly, and then in the fifth intercostal space a small TPA was placed.  Dissection was started in the fissure, dissecting down the fissure to expose the lingular branches.  These were doubly ligated with 2-0 silks, clipped and divided, then using the TLC-75 and the EZ-45 stapler the lingula was resected.  Frozen section  revealed adenocarcinoma.  The soft tissue margins were very close, so it was decided to convert to lobectomy.  The posterolateral thoracotomy incision was enlarged 3 cm more and the latissimus was completely divided and serratus reflected anteriorly.  Two TPAs were placed at right angles.  The rest of the major fissure was divided with the TLC-75, exposing a small anterior branch.  This was doubly ligated with 2-0 silk, clipped and divided, and the apical posterior branch was dissected out and stapled with the Autosuture stapler.  Then the superior pulmonary vein branch was dissected out and stapled with the Autosuture stapler.  There were some 10L and 11L nodes which were dissected free from the bronchus and the bronchus was stapled with the TL-30 stapler and divided distally.  The left upper lobe was removed. FocalSeal was applied to the staple lines and the inferior pulmonary ligament was taken down to free up the left lower lobe.  The inferior pulmonary ligament was taken down with electrocautery.  Two chest tubes were placed through the trocar sites and tied in place with 0 silk.  The chest was closed with three pericostals of #1 Vicryl in the muscle layer, 2-0 Vicryl in the subcutaneous tissue, and 3-0 Vicryl as a subcuticular stitch.  The patient was returned to the recovery room in stable condition.  Dictated by:   D. Marlyn Corporal, M.D. Attending Physician:  Mollie Germany DD:  08/04/01 TD:  08/05/01 Job: [id number]

## 2010-09-25 NOTE — H&P (Signed)
[facility]  Patient:    [name], [name] Visit Number: [id number] MRN: [id number]          Service Type: MED Location: 3A [id number] 01 Attending Physician:  Shaune Pollack Dictated by:   Redmond School, M.D. Admit Date:  11/22/2001                           History and Physical  CHIEF COMPLAINT:  Hemoptysis.  HISTORY OF PRESENT ILLNESS:  The patient began having several days November 15, 2001 onset of scant hemoptysis associated with nonpurulent sputum which progressively got worse until the day of office visitation when he was coughing up quarter sized clots.  He denies shortness of breath or chest pain. He has had no fever, rigors, or chills.  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  Maintained on Cardura for hypertension and had marked reflux symptomatology, apparently improved on Aciphex prescribed by Dr. Laural Golden.  He has had prostatic hypertrophy, hiatal hernia.  He is status post lung carcinoma resection March 2003 in [place].  He is also status post right nephrectomy for a renal cell carcinoma in September 01, 1999.  SOCIAL HISTORY:  He discontinued cigarettes in the 1970s.  He denies any alcohol or other use.  FAMILY HISTORY:  Noncontributory.  REVIEW OF SYSTEMS:  As under HPI.  PHYSICAL EXAMINATION  SKIN:  Unremarkable.  NECK:  No JVD or adenopathy.  Supple.  CHEST:  Scattered rhonchi with diminished breath sounds bilaterally.  CARDIAC:  Regular rate and rhythm without murmur, rub, or gallop.  ABDOMEN:  Soft.  No organomegaly or masses.  EXTREMITIES:  Without clubbing, cyanosis, edema.  NEUROLOGIC:  Nonfocal.  LABORATORIES:  Chest x-ray was deferred in lieu of doing a CT of the chest to evaluate source of his hemoptysis in light of his recent lung resection.  A cavitary perihilar left lesion was noted.  The remainder of his laboratories were unrevealing.  ASSESSMENT AND PLAN:  The patient is admitted for  intravenous antibiotics and presumed lung abscess treatment, especially in light of his recent complaints of reflux.  Cavitary carcinoma in less likely in light of the fact that he just had a resection and had an apparently unremarkable chest x-ray about a week ago, according to the patient.  This is not available for review at present.  An acid-fast bacillus must also be considered and cultures will be obtained as well as sputum stains for same in addition to precautionary respiratory isolation.  He is currently stable.  Consultation will be sought with gastrointestinal as well as pulmonary medicine. Dictated by:   Redmond School, M.D. Attending Physician:  Shaune Pollack DD:  11/22/01 TD:  11/24/01 Job: [id number]

## 2010-09-25 NOTE — Consult Note (Signed)
[facility]  Patient:    [name], [name] Visit Number: [id number] MRN: [id number]          Service Type: MED Location: 2000 2006 01 Attending Physician:  Mollie Germany Dictated by:   Sinda Du, M.D. Proc. Date: 11/22/01 Admit Date:  11/24/2001 Discharge Date: 11/26/2001                            Consultation Report  REFERRING PHYSICIAN:  Redmond School, M.D.  REASON FOR CONSULTATION:  Abnormal chest x-ray.  HISTORY OF PRESENT ILLNESS:  This is a 77 year old who had been followed with a left lung lesion.  He apparently had a renal cell carcinoma, ended up with a CT of the chest which showed an abnormal area in his left chest.  He underwent a VATs lobectomy by Dr. Pierre Bali in March of this year.  Since then, he came to the emergency room after having hemoptysis.  He had a CT scan done, which showed a lesion which is cavitary in the left lung now. He denies any particular cough.  Denies any fever or chills and denies any weight loss.  He has multiple risk factors for tuberculosis, however, including his renal carcinoma, the renal cell carcinoma, episode of being on prednisone on multiple occasions.  He does not know of any exposure to TB.  He does also have, however, bouts of what are clearly reflux with regurgitation of gastric contents into his mouth.  He said he woke up several times "choking."  PAST MEDICAL HISTORY:  Positive for the lung resection.  History of a renal cell carcinoma with nephrectomy in April of 2001.  He had a chronic inflammatory demyelinating polyneuropathy, BPH, hypertension, GERD.  He has been on prednisone, Cardura, Aciphex.  FAMILY HISTORY:  His father died in his 7s of a heart attack.  Mother died in her 77s of multiple problems and there is diabetes mellitus in the family.  SOCIAL HISTORY:  He has about a 40-pack-year smoking history and does not use any alcohol.  PHYSICAL  EXAMINATION:  GENERAL:  Well-developed, well-nourished male who is not in any acute distress now.  HEENT:  Pupils are reactive to light and accommodation.  Nose and throat are clear.  Mucus membranes are moist.  CHEST:  Minimal rhonchi.  He does have a chest surgical scar.  HEART:  Regular.  ABDOMEN:  Soft.  EXTREMITIES:  No edema.  ASSESSMENT:  He has an abnormal chest x-ray with a cavitary lesion. Certainly, this is going to need to be treated for tuberculosis and have tuberculosis ruled out.  PLAN:  My plan then is to try to get sputum for TB as mentioned.  I am going to go ahead and start him on four-drug therapy while we are awaiting sputums. I agree with the use of Cleocin.  My inclination is that this is actually a lung abscess rather than TB, however.  Thanks for allowing me to see him with you. Dictated by:   Sinda Du, M.D. Attending Physician:  Mollie Germany DD:  11/23/01 TD:  11/26/01 Job: [id number]

## 2010-10-16 ENCOUNTER — Other Ambulatory Visit (HOSPITAL_COMMUNITY): Payer: Self-pay | Admitting: Oncology

## 2010-10-16 ENCOUNTER — Encounter (HOSPITAL_COMMUNITY): Payer: Medicare Other | Attending: Oncology

## 2010-10-16 DIAGNOSIS — G6181 Chronic inflammatory demyelinating polyneuritis: Secondary | ICD-10-CM | POA: Insufficient documentation

## 2010-10-16 DIAGNOSIS — Z09 Encounter for follow-up examination after completed treatment for conditions other than malignant neoplasm: Secondary | ICD-10-CM | POA: Insufficient documentation

## 2010-10-16 DIAGNOSIS — C649 Malignant neoplasm of unspecified kidney, except renal pelvis: Secondary | ICD-10-CM

## 2010-10-16 DIAGNOSIS — I10 Essential (primary) hypertension: Secondary | ICD-10-CM | POA: Insufficient documentation

## 2010-10-16 DIAGNOSIS — Z85528 Personal history of other malignant neoplasm of kidney: Secondary | ICD-10-CM | POA: Insufficient documentation

## 2010-10-16 DIAGNOSIS — Z79899 Other long term (current) drug therapy: Secondary | ICD-10-CM | POA: Insufficient documentation

## 2010-10-16 DIAGNOSIS — Z85118 Personal history of other malignant neoplasm of bronchus and lung: Secondary | ICD-10-CM | POA: Insufficient documentation

## 2010-10-16 DIAGNOSIS — D472 Monoclonal gammopathy: Secondary | ICD-10-CM | POA: Insufficient documentation

## 2010-10-16 DIAGNOSIS — C3490 Malignant neoplasm of unspecified part of unspecified bronchus or lung: Secondary | ICD-10-CM

## 2010-10-16 LAB — DIFFERENTIAL
Basophils Relative: 0 % (ref 0–1)
Eosinophils Absolute: 0.1 10*3/uL (ref 0.0–0.7)
Eosinophils Relative: 2 % (ref 0–5)
Lymphs Abs: 1 10*3/uL (ref 0.7–4.0)
Monocytes Absolute: 0.6 10*3/uL (ref 0.1–1.0)
Monocytes Relative: 9 % (ref 3–12)
Neutro Abs: 5.5 10*3/uL (ref 1.7–7.7)

## 2010-10-16 LAB — COMPREHENSIVE METABOLIC PANEL
ALT: 11 U/L (ref 0–53)
Albumin: 3.5 g/dL (ref 3.5–5.2)
Alkaline Phosphatase: 95 U/L (ref 39–117)
BUN: 48 mg/dL — ABNORMAL HIGH (ref 6–23)
Chloride: 105 mEq/L (ref 96–112)
GFR calc Af Amer: 23 mL/min — ABNORMAL LOW (ref >60)
Glucose, Bld: 96 mg/dL (ref 70–99)
Potassium: 4.9 mEq/L (ref 3.5–5.1)
Sodium: 137 mEq/L (ref 135–145)
Total Bilirubin: 0.2 mg/dL — ABNORMAL LOW (ref 0.3–1.2)

## 2010-10-16 LAB — CBC
HCT: 34.6 % — ABNORMAL LOW (ref 39.0–52.0)
Hemoglobin: 11.6 g/dL — ABNORMAL LOW (ref 13.0–17.0)
MCH: 30.2 pg (ref 26.0–34.0)
MCHC: 33.5 g/dL (ref 30.0–36.0)
RDW: 13.8 % (ref 11.5–15.5)

## 2010-10-16 LAB — PROTEIN, URINE, 24 HOUR
Protein, 24H Urine: 1751 mg/d — ABNORMAL HIGH (ref 50–100)
Urine Total Volume-UPROT: 1700 mL

## 2010-10-19 LAB — KAPPA/LAMBDA LIGHT CHAINS: Lambda free light chains: 4.5 mg/dL — ABNORMAL HIGH (ref 0.57–2.63)

## 2010-10-20 LAB — MULTIPLE MYELOMA PANEL, SERUM
Albumin ELP: 56 % (ref 55.8–66.1)
Beta 2: 5.2 % (ref 3.2–6.5)
Beta Globulin: 6.1 % (ref 4.7–7.2)
Gamma Globulin: 16.7 % (ref 11.1–18.8)
IgM, Serum: 86 mg/dL (ref 41–251)
M-Spike, %: NOT DETECTED g/dL

## 2010-10-21 ENCOUNTER — Ambulatory Visit (HOSPITAL_COMMUNITY)
Admission: RE | Admit: 2010-10-21 | Discharge: 2010-10-21 | Disposition: A | Discharge status: Home / Self Care | Payer: Medicare Other | Source: Ambulatory Visit | Attending: Oncology | Admitting: Oncology

## 2010-10-21 DIAGNOSIS — Z902 Acquired absence of lung [part of]: Secondary | ICD-10-CM | POA: Insufficient documentation

## 2010-10-21 DIAGNOSIS — C3490 Malignant neoplasm of unspecified part of unspecified bronchus or lung: Secondary | ICD-10-CM | POA: Insufficient documentation

## 2010-10-27 ENCOUNTER — Other Ambulatory Visit (HOSPITAL_COMMUNITY): Payer: Self-pay | Admitting: Oncology

## 2010-10-27 ENCOUNTER — Encounter (HOSPITAL_COMMUNITY): Payer: Medicare Other | Admitting: Oncology

## 2010-10-27 DIAGNOSIS — C3490 Malignant neoplasm of unspecified part of unspecified bronchus or lung: Secondary | ICD-10-CM

## 2010-10-27 DIAGNOSIS — C649 Malignant neoplasm of unspecified kidney, except renal pelvis: Secondary | ICD-10-CM

## 2010-10-27 DIAGNOSIS — D472 Monoclonal gammopathy: Secondary | ICD-10-CM

## 2011-02-11 LAB — COMPREHENSIVE METABOLIC PANEL
ALT: 22 U/L (ref 0–53)
Alkaline Phosphatase: 67 U/L (ref 39–117)
BUN: 35 mg/dL — ABNORMAL HIGH (ref 6–23)
CO2: 25 mEq/L (ref 19–32)
Calcium: 8.8 mg/dL (ref 8.4–10.5)
GFR calc non Af Amer: 25 mL/min — ABNORMAL LOW (ref >60)
Glucose, Bld: 134 mg/dL — ABNORMAL HIGH (ref 70–99)
Sodium: 137 mEq/L (ref 135–145)

## 2011-02-11 LAB — CBC
HCT: 40.2 % (ref 39.0–52.0)
Hemoglobin: 13.6 g/dL (ref 13.0–17.0)
MCHC: 33.8 g/dL (ref 30.0–36.0)
RBC: 4.38 MIL/uL (ref 4.22–5.81)

## 2011-02-11 LAB — DIFFERENTIAL
Basophils Relative: 1 % (ref 0–1)
Eosinophils Absolute: 0.1 10*3/uL (ref 0.0–0.7)
Lymphs Abs: 0.6 10*3/uL — ABNORMAL LOW (ref 0.7–4.0)
Neutrophils Relative %: 83 % — ABNORMAL HIGH (ref 43–77)

## 2011-03-25 ENCOUNTER — Other Ambulatory Visit: Payer: Self-pay

## 2011-03-25 DIAGNOSIS — N184 Chronic kidney disease, stage 4 (severe): Secondary | ICD-10-CM

## 2011-03-25 DIAGNOSIS — Z01810 Encounter for preprocedural cardiovascular examination: Secondary | ICD-10-CM

## 2011-03-26 ENCOUNTER — Encounter: Payer: Self-pay | Admitting: Vascular Surgery

## 2011-04-06 ENCOUNTER — Encounter: Payer: Self-pay | Admitting: Vascular Surgery

## 2011-04-07 ENCOUNTER — Ambulatory Visit (INDEPENDENT_AMBULATORY_CARE_PROVIDER_SITE_OTHER): Payer: Medicare Other | Admitting: Vascular Surgery

## 2011-04-07 ENCOUNTER — Encounter: Payer: Self-pay | Admitting: Vascular Surgery

## 2011-04-07 VITALS — BP 156/83 | HR 76 | Resp 16 | Ht 67.0 in | Wt 168.0 lb

## 2011-04-07 DIAGNOSIS — N184 Chronic kidney disease, stage 4 (severe): Secondary | ICD-10-CM

## 2011-04-07 DIAGNOSIS — G6181 Chronic inflammatory demyelinating polyneuritis: Secondary | ICD-10-CM

## 2011-04-07 DIAGNOSIS — Z01810 Encounter for preprocedural cardiovascular examination: Secondary | ICD-10-CM | POA: Insufficient documentation

## 2011-04-07 HISTORY — DX: Chronic inflammatory demyelinating polyneuritis: G61.81

## 2011-04-07 NOTE — Progress Notes (Signed)
Bilateral UE vein mapping performed @ [facility] 04/07/2011

## 2011-04-07 NOTE — Progress Notes (Signed)
[facility] [place]  Patient name: [name] MRN: [id number] DOB: 1930/07/18 Sex: male  CC: Evaluate for hemodialysis access. Deferred by Dr. Lowanda Foster.  HPI: [name] is a 77 y.o. 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 He also has a history of renal cancer and underwent a nephrectomy in 2001. Patient was referred for evaluation for hemodialysis access. Of note he is right-handed. He has had no recent uremic symptoms. Specifically he denies nausea, vomiting, fatigue, anorexia, palpitations, or shortness of breath.  Patient also has a history of lung cancer and is undergone previous partial lung resection and has some underlying COPD also.  Past Medical History  Diagnosis Date  . Cancer     lung  . Cancer     renal  . BPH (benign prostatic hypertrophy)   . Chronic kidney disease   . Hypertension   . GERD (gastroesophageal reflux disease)   . Arthritis   . COPD (chronic obstructive pulmonary disease)   . Chronic inflammatory demyelinating polyneuropathy 04/07/11    Dx in 1992    History reviewed. No pertinent family history.  SOCIAL HISTORY: History  Substance Use Topics  . Smoking status: Former Smoker    Quit date: 02/04/1977  . Smokeless tobacco: Not on file  . Alcohol Use: Not on file    No Known Allergies  Current Outpatient Prescriptions  Medication Sig Dispense Refill  . alfuzosin (UROXATRAL) 10 MG 24 hr tablet Take 10 mg by mouth daily.        [name] Kitchen amLODipine (NORVASC) 5 MG tablet Take 5 mg by mouth daily.        . calcitRIOL (ROCALTROL) 0.5 MCG capsule Take 0.5 mcg by mouth daily.        . cyclobenzaprine (FLEXERIL) 10 MG tablet Take 10 mg by mouth daily.        [name] Kitchen doxazosin (CARDURA) 4 MG tablet Take 4 mg by mouth at bedtime.        [name] Kitchen lisinopril (PRINIVIL,ZESTRIL) 10 MG tablet Take 10 mg by mouth daily.          REVIEW OF SYSTEMS: Valu.Nieves ] denotes positive finding; [  ] denotes negative  finding CARDIOVASCULAR:  [ ]  chest pain   [ ]  chest pressure   [ ]  palpitations   [ ]  orthopnea   Valu.Nieves ] dyspnea on exertion   [ ]  claudication   [ ]  rest pain   [ ]  DVT   [ ]  phlebitis PULMONARY:   [ ]  productive cough   [ ]  asthma   [ ]  wheezing NEUROLOGIC:   [ ]  weakness  [ ]  paresthesias  [ ]  aphasia  [ ]  amaurosis  [ ]  dizziness HEMATOLOGIC:   [ ]  bleeding problems   [ ]  clotting disorders MUSCULOSKELETAL:  [ ]  joint pain   [ ]  joint swelling [ ]  leg swelling GASTROINTESTINAL: [ ]   blood in stool  [ ]   hematemesis GENITOURINARY:  [ ]   dysuria  [ ]   hematuria PSYCHIATRIC:  [ ]  history of major depression INTEGUMENTARY:  [ ]  rashes  [ ]  ulcers CONSTITUTIONAL:  [ ]  fever   [ ]  chills  PHYSICAL EXAM: Filed Vitals:   04/07/11 1515  BP: 156/83  Pulse: 76  Resp: 16  Height: 5\' 7"  (1.702 m)  Weight: 168 lb (76.204 kg)  SpO2: 94%   Body mass index is 26.31 kg/(m^2). GENERAL: The patient is a well-nourished male,  in no acute distress. The vital signs are documented above. CARDIOVASCULAR: There is a regular rate and rhythm without significant murmur appreciated. Do not detect any carotid bruits. He has palpable femoral pulses and warm well-perfused feet. He has no significant lower extremity swelling. PULMONARY: There is good air exchange bilaterally without wheezing or rales. ABDOMEN: Soft and non-tender with normal pitched bowel sounds. Do not palpate an abdominal aortic aneurysm. MUSCULOSKELETAL: There are no major deformities or cyanosis. NEUROLOGIC: No focal weakness or paresthesias are detected. SKIN: There are no ulcers or rashes noted. PSYCHIATRIC: The patient has a normal affect. Extremity: In the left arm the cephalic vein. Into the into the basilic vein in the upper arm.  DATA:  I have independently interpreted his vein mapping area and this shows it in the left arm the cephalic vein is thrombosed above the antecubital level. However it appears that the cephalic vein empties  into the basilic system also on duplex. The forearm cephalic vein and upper arm basilic vein appear reasonable in size. On the right side the cephalic and basilic veins appear reasonable in size also.  Have also reviewed her records from Dr. Florentina Addison office. The patient recently had problems with hyperkalemia. His potassium was 6.0 on 03/15/2011. He is due for a follow up visit and follow up labs in the near future.  MEDICAL ISSUES: I have recommended placement of a left radiocephalic AV fistula. If this vein were not adequate he could potentially have a basilic vein transposition on the left.I have explained the indications for placement of an AV fistula or AV graft. I've explained that if at all possible we will place an AV fistula.  I have reviewed the risks of placement of an AV fistula including but not limited to: failure of the fistula to mature, need for subsequent interventions, and thrombosis. In addition I have reviewed the potential complications of placement of an AV graft. These risks include, but are not limited to, graft thrombosis, graft infection, wound healing problems, bleeding, arm swelling, and steal syndrome. All the patient's questions were answered and they are agreeable to proceed with surgery.  The patient would like to keep his appointment with Dr. Lowanda Foster in the near future before scheduling surgery. He can then simply call us to schedule his surgery.  [place] [facility] [place] Office: [phone]

## 2011-04-14 ENCOUNTER — Telehealth: Payer: Self-pay | Admitting: *Deleted

## 2011-04-14 NOTE — Procedures (Unsigned)
CEPHALIC VEIN MAPPING  INDICATION:  Chronic kidney disease, stage IV.  HISTORY: Hypertension.  EXAM: The right cephalic vein is compressible.  Diameter measurements range from 0.17 to 0.50 cm.  The right basilic vein is compressible.  Diameter measurements range from 0.52 to 1.1 cm.  The left cephalic vein is not compressible.  Diameter measurements range from 0.19 to 0.44 cm in the compressible segments.  The left basilic vein is compressible.  Diameter measurements range from 0.60 to 1.10 cm.  See attached worksheet for all measurements.  IMPRESSION: 1. Patent right cephalic vein with diameter measurements as described     above. 2. Patent bilateral basilic veins with diameter measurements as     described above. 3. Occlusive thrombus present involving the left mid upper arm to     antecubital fossa segment of the cephalic vein. 4. The remainder of the left cephalic vein appears patent with     diameters as noted on the worksheet.  ___________________________________________ Judeth Cornfield. Scot Dock, M.D.  SH/MEDQ  D:  04/07/2011  T:  04/07/2011  Job:  [id number]

## 2011-04-14 NOTE — Telephone Encounter (Signed)
Spoke with pt to schedule Left AVF for Dr Scot Dock. He said he is seeing Dr Hinda Lenis on 04/30/11 and would schedule surgery after that is he needed to

## 2011-05-12 ENCOUNTER — Other Ambulatory Visit: Payer: Self-pay | Admitting: *Deleted

## 2011-05-18 ENCOUNTER — Encounter (HOSPITAL_COMMUNITY): Payer: Self-pay | Admitting: Pharmacy Technician

## 2011-05-25 ENCOUNTER — Other Ambulatory Visit: Payer: Self-pay

## 2011-05-25 ENCOUNTER — Encounter (HOSPITAL_COMMUNITY): Payer: Self-pay

## 2011-05-25 ENCOUNTER — Encounter (HOSPITAL_COMMUNITY): Payer: Self-pay | Admitting: Pharmacy Technician

## 2011-05-25 ENCOUNTER — Encounter (HOSPITAL_COMMUNITY)
Admission: RE | Admit: 2011-05-25 | Discharge: 2011-05-25 | Disposition: A | Discharge status: Home / Self Care | Payer: Medicare Other | Source: Ambulatory Visit | Attending: Vascular Surgery | Admitting: Vascular Surgery

## 2011-05-25 ENCOUNTER — Ambulatory Visit (HOSPITAL_COMMUNITY)
Admission: RE | Admit: 2011-05-25 | Discharge: 2011-05-25 | Disposition: A | Discharge status: Home / Self Care | Payer: Medicare Other | Source: Ambulatory Visit | Attending: Anesthesiology | Admitting: Anesthesiology

## 2011-05-25 DIAGNOSIS — Z01810 Encounter for preprocedural cardiovascular examination: Secondary | ICD-10-CM | POA: Insufficient documentation

## 2011-05-25 DIAGNOSIS — Z01812 Encounter for preprocedural laboratory examination: Secondary | ICD-10-CM | POA: Insufficient documentation

## 2011-05-25 DIAGNOSIS — Z01818 Encounter for other preprocedural examination: Secondary | ICD-10-CM | POA: Insufficient documentation

## 2011-05-25 LAB — BASIC METABOLIC PANEL
BUN: 48 mg/dL — ABNORMAL HIGH (ref 6–23)
Chloride: 105 mEq/L (ref 96–112)
Creatinine, Ser: 3.1 mg/dL — ABNORMAL HIGH (ref 0.50–1.35)
GFR calc non Af Amer: 18 mL/min — ABNORMAL LOW (ref >90)
Glucose, Bld: 95 mg/dL (ref 70–99)
Sodium: 141 mEq/L (ref 135–145)

## 2011-05-25 LAB — SURGICAL PCR SCREEN
MRSA, PCR: NEGATIVE
Staphylococcus aureus: NEGATIVE

## 2011-05-25 LAB — CBC
Hemoglobin: 11 g/dL — ABNORMAL LOW (ref 13.0–17.0)
MCH: 29.7 pg (ref 26.0–34.0)
MCHC: 32.5 g/dL (ref 30.0–36.0)
RDW: 13.6 % (ref 11.5–15.5)

## 2011-05-25 NOTE — Pre-Procedure Instructions (Signed)
[place]  05/25/2011   Your procedure is scheduled on:  06/01/2011  Report to [place] at 7:30 AM.  Call this number if you have problems the morning of surgery: [phone]   Remember:   Do not eat food:After Midnight.  May have clear liquids: up to 4 Hours before arrival.  Clear liquids include soda, tea, black coffee, apple or grape juice, broth.  Take these medicines the morning of surgery with A SIP OF WATER: amlodipine    Do not wear jewelry, make-up or nail polish.  Do not wear lotions, powders, or perfumes. You may wear deodorant.  Do not shave 48 hours prior to surgery.  Do not bring valuables to the hospital.  Contacts, dentures or bridgework may not be worn into surgery.  Leave suitcase in the car. After surgery it may be brought to your room.  For patients admitted to the hospital, checkout time is 11:00 AM the day of discharge.   Patients discharged the day of surgery will not be allowed to drive home.  Name and phone number of your driver: /w wife  Special Instructions: CHG Shower Use Special Wash: 1/2 bottle night before surgery and 1/2 bottle morning of surgery.   Please read over the following fact sheets that you were given: Pain Booklet, Coughing and Deep Breathing, MRSA Information and Surgical Site Infection Prevention

## 2011-05-31 MED ORDER — DEXTROSE 5 % IV SOLN
1.5000 g | INTRAVENOUS | Status: AC
  Administered 2011-06-01: 1.5 g via INTRAVENOUS
  Filled 2011-05-31: qty 1.5

## 2011-06-01 ENCOUNTER — Encounter (HOSPITAL_COMMUNITY): Payer: Self-pay | Admitting: *Deleted

## 2011-06-01 ENCOUNTER — Ambulatory Visit (HOSPITAL_COMMUNITY): Payer: Medicare Other | Admitting: *Deleted

## 2011-06-01 ENCOUNTER — Other Ambulatory Visit: Payer: Self-pay | Admitting: *Deleted

## 2011-06-01 ENCOUNTER — Encounter (HOSPITAL_COMMUNITY)
Admission: RE | Disposition: A | Discharge status: Home / Self Care | Payer: Self-pay | Source: Ambulatory Visit | Attending: Vascular Surgery

## 2011-06-01 ENCOUNTER — Ambulatory Visit (HOSPITAL_COMMUNITY)
Admission: RE | Admit: 2011-06-01 | Discharge: 2011-06-01 | Disposition: A | Discharge status: Home / Self Care | Payer: Medicare Other | Source: Ambulatory Visit | Attending: Vascular Surgery | Admitting: Vascular Surgery

## 2011-06-01 DIAGNOSIS — I129 Hypertensive chronic kidney disease with stage 1 through stage 4 chronic kidney disease, or unspecified chronic kidney disease: Secondary | ICD-10-CM | POA: Insufficient documentation

## 2011-06-01 DIAGNOSIS — N186 End stage renal disease: Secondary | ICD-10-CM

## 2011-06-01 DIAGNOSIS — N189 Chronic kidney disease, unspecified: Secondary | ICD-10-CM | POA: Insufficient documentation

## 2011-06-01 DIAGNOSIS — Z905 Acquired absence of kidney: Secondary | ICD-10-CM | POA: Insufficient documentation

## 2011-06-01 DIAGNOSIS — J4489 Other specified chronic obstructive pulmonary disease: Secondary | ICD-10-CM | POA: Insufficient documentation

## 2011-06-01 DIAGNOSIS — Z8553 Personal history of malignant neoplasm of renal pelvis: Secondary | ICD-10-CM | POA: Insufficient documentation

## 2011-06-01 DIAGNOSIS — K219 Gastro-esophageal reflux disease without esophagitis: Secondary | ICD-10-CM | POA: Insufficient documentation

## 2011-06-01 DIAGNOSIS — J449 Chronic obstructive pulmonary disease, unspecified: Secondary | ICD-10-CM | POA: Insufficient documentation

## 2011-06-01 DIAGNOSIS — N184 Chronic kidney disease, stage 4 (severe): Secondary | ICD-10-CM

## 2011-06-01 HISTORY — PX: AV FISTULA PLACEMENT: SHX1204

## 2011-06-01 LAB — POCT I-STAT 4, (NA,K, GLUC, HGB,HCT)
Glucose, Bld: 111 mg/dL — ABNORMAL HIGH (ref 70–99)
HCT: 35 % — ABNORMAL LOW (ref 39.0–52.0)
Potassium: 4.2 mEq/L (ref 3.5–5.1)
Sodium: 141 mEq/L (ref 135–145)

## 2011-06-01 SURGERY — ARTERIOVENOUS (AV) FISTULA CREATION
Anesthesia: Monitor Anesthesia Care | Site: Arm Lower | Laterality: Left | Wound class: Clean

## 2011-06-01 MED ORDER — LIDOCAINE HCL (PF) 1 % IJ SOLN
INTRAMUSCULAR | Status: DC | PRN
  Administered 2011-06-01: 30 mL

## 2011-06-01 MED ORDER — HEPARIN SODIUM (PORCINE) 5000 UNIT/ML IJ SOLN
INTRAMUSCULAR | Status: DC | PRN
  Administered 2011-06-01: 08:00:00

## 2011-06-01 MED ORDER — ONDANSETRON HCL 4 MG/2ML IJ SOLN: 4.0000 mg | Freq: Once | INTRAMUSCULAR | Status: DC | PRN

## 2011-06-01 MED ORDER — PROTAMINE SULFATE 10 MG/ML IV SOLN
INTRAVENOUS | Status: DC | PRN
  Administered 2011-06-01 (×2): 15 mg via INTRAVENOUS

## 2011-06-01 MED ORDER — 0.9 % SODIUM CHLORIDE (POUR BTL) OPTIME
TOPICAL | Status: DC | PRN
  Administered 2011-06-01: 1000 mL

## 2011-06-01 MED ORDER — MORPHINE SULFATE 2 MG/ML IJ SOLN: 0.0500 mg/kg | INTRAMUSCULAR | Status: DC | PRN

## 2011-06-01 MED ORDER — SODIUM CHLORIDE 0.9 % IV SOLN: INTRAVENOUS | Status: DC

## 2011-06-01 MED ORDER — HYDROMORPHONE HCL PF 1 MG/ML IJ SOLN: 0.2500 mg | INTRAMUSCULAR | Status: DC | PRN

## 2011-06-01 MED ORDER — FENTANYL CITRATE 0.05 MG/ML IJ SOLN
INTRAMUSCULAR | Status: DC | PRN
  Administered 2011-06-01 (×8): 25 ug via INTRAVENOUS

## 2011-06-01 MED ORDER — PROPOFOL 10 MG/ML IV EMUL
INTRAVENOUS | Status: DC | PRN
  Administered 2011-06-01: 50 ug/kg/min via INTRAVENOUS

## 2011-06-01 MED ORDER — SODIUM CHLORIDE 0.9 % IV SOLN
INTRAVENOUS | Status: DC | PRN
  Administered 2011-06-01: 08:00:00 via INTRAVENOUS

## 2011-06-01 MED ORDER — HEPARIN SODIUM (PORCINE) 1000 UNIT/ML IJ SOLN
INTRAMUSCULAR | Status: DC | PRN
  Administered 2011-06-01: 6000 [IU] via INTRAVENOUS

## 2011-06-01 MED ORDER — OXYCODONE-ACETAMINOPHEN 5-325 MG PO TABS: 1.0000 | ORAL_TABLET | ORAL | Status: AC | PRN

## 2011-06-01 MED ORDER — MIDAZOLAM HCL 5 MG/5ML IJ SOLN
INTRAMUSCULAR | Status: DC | PRN
  Administered 2011-06-01: 1 mg via INTRAVENOUS

## 2011-06-01 MED ORDER — MEPERIDINE HCL 25 MG/ML IJ SOLN: 6.2500 mg | INTRAMUSCULAR | Status: DC | PRN

## 2011-06-01 SURGICAL SUPPLY — 35 items
ADH SKN CLS APL DERMABOND .7 (GAUZE/BANDAGES/DRESSINGS) ×1
CANISTER SUCTION 2500CC (MISCELLANEOUS) ×2 IMPLANT
CLIP TI MEDIUM 6 (CLIP) ×2 IMPLANT
CLIP TI WIDE RED SMALL 6 (CLIP) ×3 IMPLANT
CLOTH BEACON ORANGE TIMEOUT ST (SAFETY) ×2 IMPLANT
COVER PROBE W GEL 5X96 (DRAPES) ×2 IMPLANT
COVER SURGICAL LIGHT HANDLE (MISCELLANEOUS) ×4 IMPLANT
DECANTER SPIKE VIAL GLASS SM (MISCELLANEOUS) ×2 IMPLANT
DERMABOND ADVANCED (GAUZE/BANDAGES/DRESSINGS) ×1
DERMABOND ADVANCED .7 DNX12 (GAUZE/BANDAGES/DRESSINGS) ×1 IMPLANT
DRAIN PENROSE 1/2X12 LTX STRL (WOUND CARE) IMPLANT
ELECT REM PT RETURN 9FT ADLT (ELECTROSURGICAL) ×2
ELECTRODE REM PT RTRN 9FT ADLT (ELECTROSURGICAL) ×1 IMPLANT
GLOVE BIO SURGEON STRL SZ7.5 (GLOVE) ×2 IMPLANT
GLOVE BIOGEL PI IND STRL 7.0 (GLOVE) IMPLANT
GLOVE BIOGEL PI IND STRL 7.5 (GLOVE) ×1 IMPLANT
GLOVE BIOGEL PI INDICATOR 7.0 (GLOVE) ×1
GLOVE BIOGEL PI INDICATOR 7.5 (GLOVE) ×1
GOWN PREVENTION PLUS XLARGE (GOWN DISPOSABLE) ×2 IMPLANT
GOWN STRL NON-REIN LRG LVL3 (GOWN DISPOSABLE) ×4 IMPLANT
KIT BASIN OR (CUSTOM PROCEDURE TRAY) ×2 IMPLANT
KIT ROOM TURNOVER OR (KITS) ×2 IMPLANT
NS IRRIG 1000ML POUR BTL (IV SOLUTION) ×2 IMPLANT
PACK CV ACCESS (CUSTOM PROCEDURE TRAY) ×2 IMPLANT
PAD ARMBOARD 7.5X6 YLW CONV (MISCELLANEOUS) ×4 IMPLANT
SPONGE GAUZE 4X4 12PLY (GAUZE/BANDAGES/DRESSINGS) ×2 IMPLANT
SPONGE SURGIFOAM ABS GEL 100 (HEMOSTASIS) IMPLANT
SUT PROLENE 6 0 BV (SUTURE) ×2 IMPLANT
SUT VIC AB 3-0 SH 27 (SUTURE) ×2
SUT VIC AB 3-0 SH 27X BRD (SUTURE) ×1 IMPLANT
SUT VICRYL 4-0 PS2 18IN ABS (SUTURE) ×2 IMPLANT
TOWEL OR 17X24 6PK STRL BLUE (TOWEL DISPOSABLE) ×2 IMPLANT
TOWEL OR 17X26 10 PK STRL BLUE (TOWEL DISPOSABLE) ×2 IMPLANT
UNDERPAD 30X30 INCONTINENT (UNDERPADS AND DIAPERS) ×2 IMPLANT
WATER STERILE IRR 1000ML POUR (IV SOLUTION) ×2 IMPLANT

## 2011-06-01 NOTE — Anesthesia Postprocedure Evaluation (Signed)
  Anesthesia Post-op Note  Patient: [name]  Procedure(s) Performed:  ARTERIOVENOUS (AV) FISTULA CREATION - Creation of Left Arteriovenous fistula  Patient Location: PACU  Anesthesia Type: MAC  Level of Consciousness: awake, alert  and oriented  Airway and Oxygen Therapy: Patient Spontanous Breathing  Post-op Pain: mild  Post-op Assessment: Post-op Vital signs reviewed, Patient's Cardiovascular Status Stable, Respiratory Function Stable, Patent Airway, No signs of Nausea or vomiting and Pain level controlled  Post-op Vital Signs: Reviewed and stable  Complications: No apparent anesthesia complications

## 2011-06-01 NOTE — Preoperative (Signed)
Beta Blockers   Reason not to administer Beta Blockers:Not Applicable 

## 2011-06-01 NOTE — H&P (Signed)
Patient name: [name] MRN: [id number] DOB: Jan 14, 1931 Sex: male  CC: Evaluate for hemodialysis access. Deferred by Dr. Lowanda Foster.  HPI:  [name] is a 78 y.o. 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 He also has a history of renal cancer and underwent a nephrectomy in 2001. Patient was referred for evaluation for hemodialysis access. Of note he is right-handed. He has had no recent uremic symptoms. Specifically he denies nausea, vomiting, fatigue, anorexia, palpitations, or shortness of breath.  Patient also has a history of lung cancer and is undergone previous partial lung resection and has some underlying COPD also.  Past Medical History   Diagnosis  Date   .  Cancer      lung   .  Cancer      renal   .  BPH (benign prostatic hypertrophy)    .  Chronic kidney disease    .  Hypertension    .  GERD (gastroesophageal reflux disease)    .  Arthritis    .  COPD (chronic obstructive pulmonary disease)    .  Chronic inflammatory demyelinating polyneuropathy  04/07/11     Dx in 1992    History reviewed. No pertinent family history.  SOCIAL HISTORY:  History   Substance Use Topics   .  Smoking status:  Former Smoker     Quit date:  02/04/1977   .  Smokeless tobacco:  Not on file   .  Alcohol Use:  Not on file    No Known Allergies  Current Outpatient Prescriptions   Medication  Sig  Dispense  Refill   .  alfuzosin (UROXATRAL) 10 MG 24 hr tablet  Take 10 mg by mouth daily.     [name] Kitchen  amLODipine (NORVASC) 5 MG tablet  Take 5 mg by mouth daily.     .  calcitRIOL (ROCALTROL) 0.5 MCG capsule  Take 0.5 mcg by mouth daily.     .  cyclobenzaprine (FLEXERIL) 10 MG tablet  Take 10 mg by mouth daily.     [name] Kitchen  doxazosin (CARDURA) 4 MG tablet  Take 4 mg by mouth at bedtime.     [name] Kitchen  lisinopril (PRINIVIL,ZESTRIL) 10 MG tablet  Take 10 mg by mouth daily.      REVIEW OF SYSTEMS: Valu.Nieves ] denotes positive finding; [ ]  denotes negative finding    CARDIOVASCULAR: [ ]  chest pain [ ]  chest pressure [ ]  palpitations [ ]  orthopnea  Valu.Nieves ] dyspnea on exertion [ ]  claudication [ ]  rest pain [ ]  DVT [ ]  phlebitis  PULMONARY: [ ]  productive cough [ ]  asthma [ ]  wheezing  NEUROLOGIC: [ ]  weakness [ ]  paresthesias [ ]  aphasia [ ]  amaurosis [ ]  dizziness  HEMATOLOGIC: [ ]  bleeding problems [ ]  clotting disorders  MUSCULOSKELETAL: [ ]  joint pain [ ]  joint swelling [ ]  leg swelling  GASTROINTESTINAL: [ ]  blood in stool [ ]  hematemesis  GENITOURINARY: [ ]  dysuria [ ]  hematuria  PSYCHIATRIC: [ ]  history of major depression  INTEGUMENTARY: [ ]  rashes [ ]  ulcers  CONSTITUTIONAL: [ ]  fever [ ]  chills  PHYSICAL EXAM:  Filed Vitals:    04/07/11 1515   BP:  156/83   Pulse:  76   Resp:  16   Height:  5\' 7"  (1.702 m)   Weight:  168 lb (76.204 kg)   SpO2:  94%    Body mass index is 26.31 kg/(m^2).  GENERAL: The patient is a well-nourished male, in no acute distress. The vital signs are documented above.  CARDIOVASCULAR: There is a regular rate and rhythm without significant murmur appreciated. Do not detect any carotid bruits. He has palpable femoral pulses and warm well-perfused feet. He has no significant lower extremity swelling.  PULMONARY: There is good air exchange bilaterally without wheezing or rales.  ABDOMEN: Soft and non-tender with normal pitched bowel sounds. Do not palpate an abdominal aortic aneurysm.  MUSCULOSKELETAL: There are no major deformities or cyanosis.  NEUROLOGIC: No focal weakness or paresthesias are detected.  SKIN: There are no ulcers or rashes noted.  PSYCHIATRIC: The patient has a normal affect.  Extremity: In the left arm the cephalic vein. Into the into the basilic vein in the upper arm.   DATA:  I have independently interpreted his vein mapping area and this shows it in the left arm the cephalic vein is thrombosed above the antecubital level. However it appears that the cephalic vein empties into the basilic system  also on duplex. The forearm cephalic vein and upper arm basilic vein appear reasonable in size. On the right side the cephalic and basilic veins appear reasonable in size also.  Have also reviewed her records from Dr. Florentina Addison office. The patient recently had problems with hyperkalemia. His potassium was 6.0 on 03/15/2011. He is due for a follow up visit and follow up labs in the near future.   MEDICAL ISSUES:   I have recommended placement of a left radiocephalic AV fistula. If this vein were not adequate he could potentially have a basilic vein transposition on the left.I have explained the indications for placement of an AV fistula or AV graft. I've explained that if at all possible we will place an AV fistula. I have reviewed the risks of placement of an AV fistula including but not limited to: failure of the fistula to mature, need for subsequent interventions, and thrombosis. In addition I have reviewed the potential complications of placement of an AV graft. These risks include, but are not limited to, graft thrombosis, graft infection, wound healing problems, bleeding, arm swelling, and steal syndrome. All the patient's questions were answered and they are agreeable to proceed with surgery.  The patient would like to keep his appointment with Dr. Lowanda Foster in the near future before scheduling surgery. He can then simply call us to schedule his surgery.   [place]  [facility] [place]  Office: [phone]

## 2011-06-01 NOTE — Op Note (Signed)
NAME: [name]   MRN: [id number] DOB: [birth year]    DATE OF OPERATION: 06/01/2011  PREOP DIAGNOSIS: chronic kidney disease  POSTOP DIAGNOSIS: same  PROCEDURE: left radial cephalic AV fistula  SURGEON: Judeth Cornfield. Scot Dock, MD, FACS  ASSIST: none  ANESTHESIA: local with sedation   EBL: minimal  INDICATIONS: [name] is a 78 y.o. male who is not yet on dialysis. We were asked to place an access.  FINDINGS: the radial artery had plaque but was patent. The vein was approximately 3-1/2 mm in size.  TECHNIQUE: Patient was brought to the operating room and sedated by anesthesia. The left upper extremity was prepped and draped in the usual sterile fashion. An incision was made at the wrist encompassing this cephalic vein. The vein was dissected free and ligated distally. It irrigated out with heparinized saline it was about a 3.5 mm vein. The artery was dissected free beneath the fascia. It was thickened but patent. Given the disease within the artery elected to make a long arteriotomy. Patient was not a candidate for a brachiocephalic AV fistula as the upper arm cephalic vein was occluded and the forearm cephalic vein emptied into the basilic system. The patient was heparinized. The radial artery was clamped proximally and distally and a longitudinal arteriotomy was made. The vein was spatulated and sewn end to side to the artery using continuous 6-0 Prolene suture. At the completion there was a good thrill in the fistula and a radial and ulnar signal with the Doppler. The heparin was partially reversed with protamine. The wounds closed the deep layer 3-0 Vicryl and the skin closed with 4-0 Vicryl. Dermabond was applied. The patient tolerated the procedure well and was transferred to the recovery room in stable condition. All needle and sponge counts were correct.  Deitra Mayo, MD, FACS [facility]  DATE OF DICTATION:   06/01/2011

## 2011-06-01 NOTE — Transfer of Care (Signed)
Immediate Anesthesia Transfer of Care Note  Patient: [name]  Procedure(s) Performed:  ARTERIOVENOUS (AV) FISTULA CREATION - Creation of Left Arteriovenous fistula  Patient Location: PACU  Anesthesia Type: MAC  Level of Consciousness: awake, oriented and patient cooperative  Airway & Oxygen Therapy: Patient Spontanous Breathing and Patient connected to nasal cannula oxygen  Post-op Assessment: Report given to PACU RN and Post -op Vital signs reviewed and stable  Post vital signs: Reviewed  Complications: No apparent anesthesia complications

## 2011-06-01 NOTE — Anesthesia Preprocedure Evaluation (Signed)
Anesthesia Evaluation  Patient identified by MRN, date of birth, ID band Patient awake    Reviewed: Allergy & Precautions, H&P , NPO status , Patient's Chart, lab work & pertinent test results  Airway Mallampati: I TM Distance: >3 FB Neck ROM: Full    Dental No notable dental hx. (+) Partial Upper and Dental Advisory Given   Pulmonary  clear to auscultation        Cardiovascular Regular     Neuro/Psych    GI/Hepatic   Endo/Other    Renal/GU Obstructive uropathy. Stage IV renal failure.     Musculoskeletal   Abdominal   Peds  Hematology   Anesthesia Other Findings   Reproductive/Obstetrics                           Anesthesia Physical Anesthesia Plan  ASA: III  Anesthesia Plan: MAC   Post-op Pain Management:    Induction: Intravenous  Airway Management Planned: Simple Face Mask  Additional Equipment:   Intra-op Plan:   Post-operative Plan:   Informed Consent: I have reviewed the patients History and Physical, chart, labs and discussed the procedure including the risks, benefits and alternatives for the proposed anesthesia with the patient or authorized representative who has indicated his/her understanding and acceptance.   Dental advisory given  Plan Discussed with: CRNA and Surgeon  Anesthesia Plan Comments:         Anesthesia Quick Evaluation

## 2011-06-02 ENCOUNTER — Encounter (HOSPITAL_COMMUNITY): Payer: Self-pay | Admitting: Vascular Surgery

## 2011-07-13 ENCOUNTER — Encounter: Payer: Self-pay | Admitting: Vascular Surgery

## 2011-07-14 ENCOUNTER — Ambulatory Visit: Payer: Medicare Other | Admitting: Vascular Surgery

## 2011-07-14 ENCOUNTER — Encounter (INDEPENDENT_AMBULATORY_CARE_PROVIDER_SITE_OTHER): Payer: Medicare Other | Admitting: *Deleted

## 2011-07-14 ENCOUNTER — Encounter: Payer: Self-pay | Admitting: Physician Assistant

## 2011-07-14 ENCOUNTER — Ambulatory Visit (INDEPENDENT_AMBULATORY_CARE_PROVIDER_SITE_OTHER): Payer: Medicare Other | Admitting: Physician Assistant

## 2011-07-14 VITALS — BP 167/74 | HR 85 | Resp 20 | Ht 67.0 in | Wt 174.0 lb

## 2011-07-14 DIAGNOSIS — T82898A Other specified complication of vascular prosthetic devices, implants and grafts, initial encounter: Secondary | ICD-10-CM

## 2011-07-14 DIAGNOSIS — N186 End stage renal disease: Secondary | ICD-10-CM

## 2011-07-14 NOTE — Progress Notes (Signed)
[facility] [place] Postoperative Visit hemodialysis access   Date of Surgery:  06/01/11 Surgeon: Scot Dock HD:  no HD:  Days:    CC: f/u for left radiocephalic AVF placement  HPI:  This is a 78 y.o. male who returns today s/p  left radiocephalic AVF placement By Dr. Scot Dock on 06/01/11.  He denies any steal symptoms with the exception of occasional numbness in his fingers if he puts his hand a certain way.  PHYSICAL EXAMINATION:  Filed Vitals:   07/14/11 1607  BP: 167/74  Pulse: 85  Resp: 20     Incision is well healed Hand grip is equal bilaterally and sensation in digits is intact; There is  Thrill; there is bruit. The graft/fistula is  Palpable but not maturing.  Pulse:  1+ radial pulse left  Duplex of AVF 07/14/2011  1.  Arterial inflow disease 50-75% stenosis 2.  Retro radial flow distal to anastomosis 3.  Mid proximal FA venous irregularity:  Web like thrombus: 4.  Elevated velocities in distal FA at valve/anatomically narrow segment   ASSESSMENT/PLAN:  [name] is a 78 y.o. year old male who presents s/p  left radiocephalic AVF placement -Dr. Scot Dock also evaluated the pt.  He has a 6.4 cm area of gross irregularity proximal to the incision. -Dr. Scot Dock wants to do a fisutlogram in 4 weeks to evaluate fistula. -pt is not yet on HD.   [name] Gong, PA-C [facility] [phone]  Clinic MD:   Scot Dock

## 2011-07-19 DIAGNOSIS — I10 Essential (primary) hypertension: Secondary | ICD-10-CM | POA: Diagnosis not present

## 2011-07-19 DIAGNOSIS — R809 Proteinuria, unspecified: Secondary | ICD-10-CM | POA: Diagnosis not present

## 2011-07-19 DIAGNOSIS — E559 Vitamin D deficiency, unspecified: Secondary | ICD-10-CM | POA: Diagnosis not present

## 2011-07-19 DIAGNOSIS — Z79899 Other long term (current) drug therapy: Secondary | ICD-10-CM | POA: Diagnosis not present

## 2011-07-19 DIAGNOSIS — D649 Anemia, unspecified: Secondary | ICD-10-CM | POA: Diagnosis not present

## 2011-07-19 DIAGNOSIS — N189 Chronic kidney disease, unspecified: Secondary | ICD-10-CM | POA: Diagnosis not present

## 2011-07-23 ENCOUNTER — Other Ambulatory Visit: Payer: Self-pay

## 2011-07-28 NOTE — Procedures (Unsigned)
VASCULAR LAB EXAM  INDICATION:  Followup left radiocephalic fistula placed [id number].  HISTORY: Diabetes: Cardiac: Hypertension:  EXAM:  The left radiocephalic fistula was examined and found patent. There is an arterial stenosis proximal to the anastomosis with velocities of 425 cm/sec and a velocity ratio of 2.7 which corresponds to approximately 50% to 75% stenosis.  The radial artery distal to the anastomosis is retrograde.  In the prox to mid forearm there is a 6.4 cm area of gross irregularity which appears to be a web-like thrombus just distal to a valve remnant which also has elevated velocities at 415 cm/sec.  In the proximal segment of the vein near the wrist, there are elevated velocities which appear to be due to anatomic narrowing.  Velocities are 543 cm/sec with a diameter of 0.21 cm.  The remainder of the fistula appears unremarkable, however, the cephalic vein does appear to empty into the basilic system.  The cephalic vein in the upper arm presents with normal venous flow and venous outflow for the fistula appears to be the basilic vein into the brachial artery.  IMPRESSION:  Patent left radiocephalic fistula which empties into the basilic system.  An arterial stenosis proximal to the anastomosis approximately 50% to 75%.  Venous irregularity in the prox to mid forearm which may be web-like thrombus.  Elevated velocities observed at an anatomically narrowed segment as described above.  ___________________________________________ Judeth Cornfield. Scot Dock, M.D.  LT/MEDQ  D:  07/15/2011  T:  07/15/2011  Job:  [id number]

## 2011-07-30 ENCOUNTER — Encounter (HOSPITAL_COMMUNITY): Payer: Self-pay | Admitting: Pharmacy Technician

## 2011-08-16 ENCOUNTER — Ambulatory Visit (HOSPITAL_COMMUNITY)
Admission: RE | Admit: 2011-08-16 | Discharge: 2011-08-16 | Disposition: A | Discharge status: Home / Self Care | Payer: Medicare Other | Source: Ambulatory Visit | Attending: Vascular Surgery | Admitting: Vascular Surgery

## 2011-08-16 ENCOUNTER — Encounter (HOSPITAL_COMMUNITY)
Admission: RE | Disposition: A | Discharge status: Home / Self Care | Payer: Self-pay | Source: Ambulatory Visit | Attending: Vascular Surgery

## 2011-08-16 DIAGNOSIS — G6181 Chronic inflammatory demyelinating polyneuritis: Secondary | ICD-10-CM | POA: Insufficient documentation

## 2011-08-16 DIAGNOSIS — Z79899 Other long term (current) drug therapy: Secondary | ICD-10-CM | POA: Insufficient documentation

## 2011-08-16 DIAGNOSIS — J4489 Other specified chronic obstructive pulmonary disease: Secondary | ICD-10-CM | POA: Insufficient documentation

## 2011-08-16 DIAGNOSIS — T82598A Other mechanical complication of other cardiac and vascular devices and implants, initial encounter: Secondary | ICD-10-CM | POA: Insufficient documentation

## 2011-08-16 DIAGNOSIS — N184 Chronic kidney disease, stage 4 (severe): Secondary | ICD-10-CM | POA: Insufficient documentation

## 2011-08-16 DIAGNOSIS — Z905 Acquired absence of kidney: Secondary | ICD-10-CM | POA: Insufficient documentation

## 2011-08-16 DIAGNOSIS — K219 Gastro-esophageal reflux disease without esophagitis: Secondary | ICD-10-CM | POA: Insufficient documentation

## 2011-08-16 DIAGNOSIS — I129 Hypertensive chronic kidney disease with stage 1 through stage 4 chronic kidney disease, or unspecified chronic kidney disease: Secondary | ICD-10-CM | POA: Insufficient documentation

## 2011-08-16 DIAGNOSIS — N186 End stage renal disease: Secondary | ICD-10-CM

## 2011-08-16 DIAGNOSIS — Y832 Surgical operation with anastomosis, bypass or graft as the cause of abnormal reaction of the patient, or of later complication, without mention of misadventure at the time of the procedure: Secondary | ICD-10-CM | POA: Insufficient documentation

## 2011-08-16 DIAGNOSIS — Z87891 Personal history of nicotine dependence: Secondary | ICD-10-CM | POA: Insufficient documentation

## 2011-08-16 DIAGNOSIS — J449 Chronic obstructive pulmonary disease, unspecified: Secondary | ICD-10-CM | POA: Insufficient documentation

## 2011-08-16 DIAGNOSIS — Z85528 Personal history of other malignant neoplasm of kidney: Secondary | ICD-10-CM | POA: Insufficient documentation

## 2011-08-16 DIAGNOSIS — M129 Arthropathy, unspecified: Secondary | ICD-10-CM | POA: Insufficient documentation

## 2011-08-16 DIAGNOSIS — T82898A Other specified complication of vascular prosthetic devices, implants and grafts, initial encounter: Secondary | ICD-10-CM

## 2011-08-16 DIAGNOSIS — Z85118 Personal history of other malignant neoplasm of bronchus and lung: Secondary | ICD-10-CM | POA: Insufficient documentation

## 2011-08-16 HISTORY — PX: OTHER SURGICAL HISTORY: SHX169

## 2011-08-16 HISTORY — PX: FISTULOGRAM: SHX5832

## 2011-08-16 SURGERY — FISTULOGRAM
Anesthesia: LOCAL

## 2011-08-16 MED ORDER — LIDOCAINE HCL (PF) 1 % IJ SOLN
INTRAMUSCULAR | Status: AC
  Filled 2011-08-16: qty 30

## 2011-08-16 MED ORDER — HEPARIN (PORCINE) IN NACL 2-0.9 UNIT/ML-% IJ SOLN
INTRAMUSCULAR | Status: AC
  Filled 2011-08-16: qty 1000

## 2011-08-16 MED ORDER — SODIUM CHLORIDE 0.9 % IJ SOLN: 3.0000 mL | INTRAMUSCULAR | Status: DC | PRN

## 2011-08-16 NOTE — Discharge Instructions (Signed)
AV Fistula Care After Refer to this sheet in the next few weeks. These instructions provide you with information on caring for yourself after your procedure. Your caregiver may also give you more specific instructions. Your treatment has been planned according to current medical practices, but problems sometimes occur. Call your caregiver if you have any problems or questions after your procedure. HOME CARE INSTRUCTIONS   Do not drive a car or take public transportation alone.   Do not drink alcohol.   Only take medicine that has been prescribed by your caregiver.   Do not sign important papers or make important decisions.   Have a responsible person with you.   Ask your caregiver to show you how to check your access at home for a vibration (called a "thrill") or for a sound (called a "bruit" pronounced brew-ee).   Your vein will need time to enlarge and mature so needles can be inserted for dialysis. Follow your caregiver's instructions about what you need to do to make this happen.   Keep dressings clean and dry.   Keep the arm elevated above your heart. Use a pillow.   Rest.   Use the arm as usual for all activities.   Have the stitches or tape closures removed in 10 to 14 days, or as directed by your caregiver.   Do not sleep or lie on the area of the fistula or that arm. This may decrease or stop the blood flow through your fistula.   Do not allow blood pressures to be taken on this arm.   Do not allow blood drawing to be done from the graft.   Do not wear tight clothing around the access site or on the arm.   Avoid lifting heavy objects with the arm that has the fistula.   Do not use creams or lotions over the access site.  SEEK MEDICAL CARE IF:   You have a fever.   You have swelling around the fistula that gets worse, or you have new pain.   You have unusual bleeding at the fistula site or from any other area.   You have pus or other drainage at the fistula  site.   You have skin redness or red streaking on the skin around, above, or below the fistula site.   Your access site feels warm.   You have any flu-like symptoms.  SEEK IMMEDIATE MEDICAL CARE IF:   You have pain, numbness, or an unusual pale skin on the hand or on the side of your fistula.   You have dizziness or weakness that you have not had before.   You have shortness of breath.   You have chest pain.   Your fistula disconnects or breaks, and there is bleeding that cannot be easily controlled.  Call for local emergency medical help. Do not try to drive yourself to the hospital. MAKE SURE YOU  Understand these instructions.   Will watch your condition.   Will get help right away if you are not doing well or get worse.  Document Released: 04/26/2005 Document Revised: 04/15/2011 Document Reviewed: 10/14/2010 [employer] Patient Information 2012 [employer]. 

## 2011-08-16 NOTE — Progress Notes (Signed)
D/C instructions given, pt and family verbalized understanding.

## 2011-08-16 NOTE — Op Note (Signed)
NAME: [name]   MRN: [id number] DOB: [birth year]    DATE OF OPERATION: 08/16/2011  PREOP DIAGNOSIS: Chronic kidney disease  POSTOP DIAGNOSIS: Same  PROCEDURE:  1. Ultrasound-guided access to left radial cephalic AV 2. fistulogram left radial cephalic AV fistula  SURGEON: Judeth Cornfield. Scot Dock, MD, FACS  ANESTHESIA: local   EBL: minimal  INDICATIONS: [name] is a 78 y.o. male who had a left radial cephalic fistula placed in January of this year. He is not yet on dialysis. Duplex showed 2 areas of stenosis within his graft. He is brought in for a fistulogram.  TECHNIQUE: The patient was brought to the [facility]  Lab and the left upper extremity was prepped and draped in the usual sterile fashion. Under ultrasound guidance, after the skin was anesthetized, the left radial cephalic fistula was cannulated and a guidewire introduced under fluoroscopic control. The micropuncture sheath was introduced over the wire. A fistulogram was then obtained using half-strength contrast. A blood pressure cuff was inflated to suprasystolic pressure and a fistulogram was repeated to allow reflux and demonstrate the arterial anastomosis.  FINDINGS: There are 4 competing branches noted in his left forearm AV fistula. There is an area of irregularity in the proximal fistula over a length of approximately 2-1/2 cm. There is an additional area of irregularity in the fistula just below the antecubital level over a distance of approximately 4 cm. There were no problems verified the arterial anastomosis.  Deitra Mayo, MD, FACS [facility]  DATE OF DICTATION:   08/16/2011

## 2011-08-16 NOTE — H&P (Signed)
[facility]   Patient name: [name]  MRN: [id number]  DOB: 1930-07-31    CC: poorly maturing left radiocephalic AV fistula HPI:  [name] is a 77 y.o. 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 He also has a history of renal cancer and underwent a nephrectomy in 2001. Patient was referred for evaluation for hemodialysis access. He had a left radiocephalic AV fistula placed in January. A follow up duplex scan suggested some narrowing in the midportion of his fistula. He is brought in for a fistulogram.  He has had no recent uremic symptoms. Specifically he denies nausea, vomiting, fatigue, anorexia, palpitations, or shortness of breath.   Patient also has a history of lung cancer and is undergone previous partial lung resection and has some underlying COPD also.   Past Medical History   Diagnosis  Date   .  Cancer      lung   .  Cancer      renal   .  BPH (benign prostatic hypertrophy)    .  Chronic kidney disease    .  Hypertension    .  GERD (gastroesophageal reflux disease)    .  Arthritis    .  COPD (chronic obstructive pulmonary disease)    .  Chronic inflammatory demyelinating polyneuropathy  04/07/11     Dx in 1992    History reviewed. No pertinent family history.   SOCIAL HISTORY:  History   Substance Use Topics   .  Smoking status:  Former Smoker     Quit date:  02/04/1977   .  Smokeless tobacco:  Not on file   .  Alcohol Use:  Not on file    No Known Allergies  Current Outpatient Prescriptions   Medication  Sig  Dispense  Refill   .  alfuzosin (UROXATRAL) 10 MG 24 hr tablet  Take 10 mg by mouth daily.     [name] Kitchen  amLODipine (NORVASC) 5 MG tablet  Take 5 mg by mouth daily.     .  calcitRIOL (ROCALTROL) 0.5 MCG capsule  Take 0.5 mcg by mouth daily.     .  cyclobenzaprine (FLEXERIL) 10 MG tablet  Take 10 mg by mouth daily.     [name] Kitchen  doxazosin (CARDURA) 4 MG tablet  Take 4 mg by mouth at  bedtime.     [name] Kitchen  lisinopril (PRINIVIL,ZESTRIL) 10 MG tablet  Take 10 mg by mouth daily.      REVIEW OF SYSTEMS: Valu.Nieves ] denotes positive finding; [ ]  denotes negative finding  CARDIOVASCULAR: [ ]  chest pain [ ]  chest pressure [ ]  palpitations [ ]  orthopnea  Valu.Nieves ] dyspnea on exertion [ ]  claudication [ ]  rest pain [ ]  DVT [ ]  phlebitis  PULMONARY: [ ]  productive cough [ ]  asthma [ ]  wheezing  NEUROLOGIC: [ ]  weakness [ ]  paresthesias [ ]  aphasia [ ]  amaurosis [ ]  dizziness  HEMATOLOGIC: [ ]  bleeding problems [ ]  clotting disorders  MUSCULOSKELETAL: [ ]  joint pain [ ]  joint swelling [ ]  leg swelling  GASTROINTESTINAL: [ ]  blood in stool [ ]  hematemesis  GENITOURINARY: [ ]  dysuria [ ]  hematuria  PSYCHIATRIC: [ ]  history of major depression  INTEGUMENTARY: [ ]  rashes [ ]  ulcers  CONSTITUTIONAL: [ ]  fever [ ]  chills  PHYSICAL EXAM:  Filed Vitals:    04/07/11 1515   BP:  156/83   Pulse:  76  Resp:  16   Height:  5\' 7"  (1.702 m)   Weight:  168 lb (76.204 kg)   SpO2:  94%    Body mass index is 26.31 kg/(m^2).  GENERAL: The patient is a well-nourished male, in no acute distress. The vital signs are documented above.  CARDIOVASCULAR: There is a regular rate and rhythm without significant murmur appreciated. Do not detect any carotid bruits. He has palpable femoral pulses and warm well-perfused feet. He has no significant lower extremity swelling.  PULMONARY: There is good air exchange bilaterally without wheezing or rales.  ABDOMEN: Soft and non-tender with normal pitched bowel sounds. Do not palpate an abdominal aortic aneurysm.  MUSCULOSKELETAL: There are no major deformities or cyanosis.  NEUROLOGIC: No focal weakness or paresthesias are detected.  SKIN: There are no ulcers or rashes noted.  PSYCHIATRIC: The patient has a normal affect.  Extremity: His left forearm AV fistula has a good thrill. Incision is healed per   DATA:  I reviewed his duplex of his AV fistula which shows a segment of  narrowing in the midportion the fistula and also some increased velocities proximally.  MEDICAL ISSUES:  This patient is not yet on dialysis. He presents for a fistulogram. There are 2 areas of narrowing one proximally and a more significant one in the midportion of his fistula. I do not think that it would not be technically possible to to access the fistula just above the anastomosis and still have enough working room to address both areas of stenosis. This reason of cannulate below the more significant area of stenosis and try to address this if it appears to be amenable to angioplasty. We will use minimal diet is not yet on dialysis.  [place]  [facility] [place]  Office: [phone]

## 2011-08-17 LAB — POCT I-STAT, CHEM 8
Creatinine, Ser: 3.4 mg/dL — ABNORMAL HIGH (ref 0.50–1.35)
Glucose, Bld: 110 mg/dL — ABNORMAL HIGH (ref 70–99)
HCT: 35 % — ABNORMAL LOW (ref 39.0–52.0)
Hemoglobin: 11.9 g/dL — ABNORMAL LOW (ref 13.0–17.0)
Sodium: 141 mEq/L (ref 135–145)
TCO2: 21 mmol/L (ref 0–100)

## 2011-09-15 ENCOUNTER — Ambulatory Visit (INDEPENDENT_AMBULATORY_CARE_PROVIDER_SITE_OTHER): Payer: Medicare Other | Admitting: Vascular Surgery

## 2011-09-15 ENCOUNTER — Encounter: Payer: Self-pay | Admitting: Vascular Surgery

## 2011-09-15 VITALS — BP 162/78 | HR 62 | Resp 18 | Ht 67.0 in | Wt 171.3 lb

## 2011-09-15 DIAGNOSIS — N186 End stage renal disease: Secondary | ICD-10-CM

## 2011-09-15 NOTE — Progress Notes (Signed)
[facility]  Patient name: [name] MRN: [id number] DOB: 1930-06-27 Sex: male  REASON FOR VISIT: follow up of left radial cephalic AV fistula.  HPI: [name] is a 78 y.o. male who had a left radial cephalic AV fistula placed on 06/01/11. He was noted to have a 3.5 mm cephalic vein. He underwent a fistulogram on for 813 which showed 4 competing branches in the left forearm AV fistula. It was also some irregularity in the proximal fistula over a length of approximately 2-1/2 cm. There was also some irregularity in the fistula below the antecubital level. We have discussed potentially ligating these branches however he was hesitant to consider further surgery. He comes in for a routine follow up visit. His had no specific complaints referrable to the fistula.   REVIEW OF SYSTEMS: Valu.Nieves ] denotes positive finding; [  ] denotes negative finding  CARDIOVASCULAR:  [ ]  chest pain   [ ]  dyspnea on exertion    CONSTITUTIONAL:  [ ]  fever   [ ]  chills  PHYSICAL EXAM: Filed Vitals:   09/15/11 1327  BP: 162/78  Pulse: 62  Resp: 18  Height: 5\' 7"  (1.702 m)  Weight: 171 lb 4.8 oz (77.701 kg)   Body mass index is 26.83 kg/(m^2). GENERAL: The patient is a well-nourished male, in no acute distress. The vital signs are documented above. CARDIOVASCULAR: There is a regular rate and rhythm  PULMONARY: There is good air exchange bilaterally without wheezing or rales. The fistula has a good thrill proximally. It is more difficult to follow in the upper forearm. There are prominent branches noted.  MEDICAL ISSUES: I have discussed with him the option of ligating the competing branches in potentially patching the area of narrowing in the proximal fistula. Again he is quite hesitant to consider further surgery. He has a follow up appointment with his nephrologist next month and would like to determine how his kidney function is before proceeding with any further surgery. If he  changes his mind or if his kidney function is worsening I think the best option to try to salvage the forearm fistula would be to ligate the competing branches and patch the proximal fistula. The other option would be to perform a basilic vein transposition on the left. The forearm cephalic vein empties into the basilic system on the left.  He is 36 and understandably is not anxious for any further surgery although ultimately I think he will require further work to try to salvage his radial cephalic fistula.  [place] [facility] [place] Beeper: [phone]

## 2011-09-30 DIAGNOSIS — R809 Proteinuria, unspecified: Secondary | ICD-10-CM | POA: Diagnosis not present

## 2011-09-30 DIAGNOSIS — E559 Vitamin D deficiency, unspecified: Secondary | ICD-10-CM | POA: Diagnosis not present

## 2011-09-30 DIAGNOSIS — Z79899 Other long term (current) drug therapy: Secondary | ICD-10-CM | POA: Diagnosis not present

## 2011-09-30 DIAGNOSIS — I10 Essential (primary) hypertension: Secondary | ICD-10-CM | POA: Diagnosis not present

## 2011-09-30 DIAGNOSIS — N189 Chronic kidney disease, unspecified: Secondary | ICD-10-CM | POA: Diagnosis not present

## 2011-10-19 ENCOUNTER — Encounter (HOSPITAL_COMMUNITY): Payer: Medicare Other | Attending: Oncology

## 2011-10-19 ENCOUNTER — Other Ambulatory Visit (HOSPITAL_COMMUNITY): Payer: Self-pay

## 2011-10-19 DIAGNOSIS — Z09 Encounter for follow-up examination after completed treatment for conditions other than malignant neoplasm: Secondary | ICD-10-CM | POA: Insufficient documentation

## 2011-10-19 DIAGNOSIS — D472 Monoclonal gammopathy: Secondary | ICD-10-CM | POA: Insufficient documentation

## 2011-10-19 DIAGNOSIS — G6181 Chronic inflammatory demyelinating polyneuritis: Secondary | ICD-10-CM | POA: Insufficient documentation

## 2011-10-19 DIAGNOSIS — Z85528 Personal history of other malignant neoplasm of kidney: Secondary | ICD-10-CM | POA: Insufficient documentation

## 2011-10-19 DIAGNOSIS — Z85118 Personal history of other malignant neoplasm of bronchus and lung: Secondary | ICD-10-CM | POA: Insufficient documentation

## 2011-10-19 DIAGNOSIS — C649 Malignant neoplasm of unspecified kidney, except renal pelvis: Secondary | ICD-10-CM

## 2011-10-19 DIAGNOSIS — N049 Nephrotic syndrome with unspecified morphologic changes: Secondary | ICD-10-CM | POA: Insufficient documentation

## 2011-10-19 LAB — DIFFERENTIAL
Basophils Relative: 0 % (ref 0–1)
Eosinophils Absolute: 0.1 10*3/uL (ref 0.0–0.7)
Neutrophils Relative %: 78 % — ABNORMAL HIGH (ref 43–77)

## 2011-10-19 LAB — COMPREHENSIVE METABOLIC PANEL
AST: 14 U/L (ref 0–37)
Albumin: 3.3 g/dL — ABNORMAL LOW (ref 3.5–5.2)
CO2: 24 mEq/L (ref 19–32)
Calcium: 10 mg/dL (ref 8.4–10.5)
Creatinine, Ser: 3.85 mg/dL — ABNORMAL HIGH (ref 0.50–1.35)
GFR calc non Af Amer: 14 mL/min — ABNORMAL LOW (ref >90)

## 2011-10-19 LAB — CBC
MCH: 30.1 pg (ref 26.0–34.0)
MCHC: 33.2 g/dL (ref 30.0–36.0)
Platelets: 211 10*3/uL (ref 150–400)
RBC: 3.76 MIL/uL — ABNORMAL LOW (ref 4.22–5.81)

## 2011-10-19 LAB — PROTEIN, URINE, 24 HOUR: Collection Interval-UPROT: 24 hours

## 2011-10-19 LAB — CREATININE CLEARANCE, URINE, 24 HOUR
Creatinine, 24H Ur: 1362 mg/d (ref 800–2000)
Urine Total Volume-CRCL: 1650 mL

## 2011-10-19 NOTE — Progress Notes (Signed)
Lab draw

## 2011-10-20 LAB — KAPPA/LAMBDA LIGHT CHAINS
Kappa free light chain: 17.6 mg/dL — ABNORMAL HIGH (ref 0.33–1.94)
Kappa, lambda light chain ratio: 2.48 — ABNORMAL HIGH (ref 0.26–1.65)
Lambda free light chains: 7.11 mg/dL — ABNORMAL HIGH (ref 0.57–2.63)

## 2011-10-21 LAB — UIFE/LIGHT CHAINS/TP QN, 24-HR UR
Alpha 1, Urine: DETECTED — AB
Free Kappa Lt Chains,Ur: 35.2 mg/dL — ABNORMAL HIGH (ref 0.14–2.42)
Free Kappa/Lambda Ratio: 10.6 ratio — ABNORMAL HIGH (ref 2.04–10.37)
Free Lambda Excretion/Day: 54.78 mg/d
Free Lambda Lt Chains,Ur: 3.32 mg/dL — ABNORMAL HIGH (ref 0.02–0.67)
Free Lt Chn Excr Rate: 580.8 mg/d
Total Protein, Urine: 133.1 mg/dL
Volume, Urine: 1650 mL

## 2011-10-21 LAB — IMMUNOFIXATION ELECTROPHORESIS
IgA: 280 mg/dL (ref 68–379)
IgG (Immunoglobin G), Serum: 1080 mg/dL (ref 650–1600)

## 2011-10-21 LAB — PROTEIN ELECTROPHORESIS, SERUM
Albumin ELP: 56 % (ref 55.8–66.1)
Total Protein ELP: 6.2 g/dL (ref 6.0–8.3)

## 2011-10-22 ENCOUNTER — Ambulatory Visit (HOSPITAL_COMMUNITY)
Admission: RE | Admit: 2011-10-22 | Discharge: 2011-10-22 | Disposition: A | Discharge status: Home / Self Care | Payer: Medicare Other | Source: Ambulatory Visit | Attending: Oncology | Admitting: Oncology

## 2011-10-22 DIAGNOSIS — C3490 Malignant neoplasm of unspecified part of unspecified bronchus or lung: Secondary | ICD-10-CM | POA: Insufficient documentation

## 2011-10-26 ENCOUNTER — Encounter (HOSPITAL_BASED_OUTPATIENT_CLINIC_OR_DEPARTMENT_OTHER): Payer: Medicare Other | Admitting: Oncology

## 2011-10-26 ENCOUNTER — Encounter (HOSPITAL_COMMUNITY): Payer: Self-pay | Admitting: Oncology

## 2011-10-26 VITALS — BP 156/78 | HR 73 | Temp 98.0°F | Ht 67.0 in | Wt 169.0 lb

## 2011-10-26 DIAGNOSIS — J449 Chronic obstructive pulmonary disease, unspecified: Secondary | ICD-10-CM

## 2011-10-26 DIAGNOSIS — C649 Malignant neoplasm of unspecified kidney, except renal pelvis: Secondary | ICD-10-CM

## 2011-10-26 DIAGNOSIS — N189 Chronic kidney disease, unspecified: Secondary | ICD-10-CM

## 2011-10-26 DIAGNOSIS — C3410 Malignant neoplasm of upper lobe, unspecified bronchus or lung: Secondary | ICD-10-CM

## 2011-10-26 DIAGNOSIS — C3490 Malignant neoplasm of unspecified part of unspecified bronchus or lung: Secondary | ICD-10-CM

## 2011-10-26 NOTE — Progress Notes (Signed)
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  Problem #2 COPD secondary to long-standing smoking history  Problem #3 renal cell carcinoma (T2 N0) status post right nephrectomy on 08/31/1999 by Dr. [place] Pink thus far without recurrent disease  Problem #4 renal failure with nephrotic syndrome seeing Dr. Lowanda Foster  Problem #5 minimal MGUS with no evidence of M spike at this time nor does he have Bence-Jones proteinuria.  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  Problem #7 right hand tendon issues with inability to extend his fourth and fifth fingers. He has been told this is due to tendon rupture by his hand surgeon Dr. Wray Kearns today for followup. We went over the results of his laboratory work and a CT scan. There is no evidence recurrence of his lung cancer nor is there a new cancer and his protein studies are very stable.  His physical exam shows stable vital signs definite weakness of the right hand with poor no extension of the fourth and fifth fingers. He has no adenopathy in the cervical supraclavicular infraclavicular axillary or inguinal areas. His abdomen is soft and nontender without organomegaly his lungs are clear but with diminished breath sounds. Heart shows a regular rhythm and rate without obvious murmur or gallop today.  I will see him back in one year with a CT scan of the chest without contrast. I will try check his immunoglobulins either in 12 months a 24 months.

## 2011-10-26 NOTE — Patient Instructions (Signed)
[facility]  Discharge Instructions  RECOMMENDATIONS MADE BY THE CONSULTANT AND ANY TEST RESULTS WILL BE SENT TO YOUR REFERRING DOCTOR.   Return to clinic in 1 year for CT Scan of the chest and the to see the doctor shortly after that.   I acknowledge that I have been informed and understand all the instructions given to me and received a copy. I do not have any more questions at this time, but understand that I may call the Specialty [facility] at [facility] at [phone] during business hours should I have any further questions or need assistance in obtaining follow-up care.    __________________________________________  _____________  __________ Signature of Patient or Authorized Representative            Date                   Time    __________________________________________ Nurse's Signature

## 2011-11-15 ENCOUNTER — Encounter: Payer: Self-pay | Admitting: Oncology

## 2011-11-30 ENCOUNTER — Other Ambulatory Visit (HOSPITAL_COMMUNITY): Payer: Self-pay | Admitting: Urology

## 2011-11-30 DIAGNOSIS — N19 Unspecified kidney failure: Secondary | ICD-10-CM

## 2011-11-30 DIAGNOSIS — N400 Enlarged prostate without lower urinary tract symptoms: Secondary | ICD-10-CM

## 2011-12-06 ENCOUNTER — Ambulatory Visit (HOSPITAL_COMMUNITY)
Admission: RE | Admit: 2011-12-06 | Discharge: 2011-12-06 | Disposition: A | Discharge status: Home / Self Care | Payer: Medicare Other | Source: Ambulatory Visit | Attending: Urology | Admitting: Urology

## 2011-12-06 DIAGNOSIS — J984 Other disorders of lung: Secondary | ICD-10-CM | POA: Insufficient documentation

## 2011-12-06 DIAGNOSIS — Z905 Acquired absence of kidney: Secondary | ICD-10-CM | POA: Insufficient documentation

## 2011-12-06 DIAGNOSIS — N200 Calculus of kidney: Secondary | ICD-10-CM | POA: Insufficient documentation

## 2011-12-06 DIAGNOSIS — K5730 Diverticulosis of large intestine without perforation or abscess without bleeding: Secondary | ICD-10-CM | POA: Insufficient documentation

## 2011-12-06 DIAGNOSIS — N19 Unspecified kidney failure: Secondary | ICD-10-CM

## 2011-12-06 DIAGNOSIS — N400 Enlarged prostate without lower urinary tract symptoms: Secondary | ICD-10-CM | POA: Insufficient documentation

## 2011-12-27 DIAGNOSIS — Z79899 Other long term (current) drug therapy: Secondary | ICD-10-CM | POA: Diagnosis not present

## 2011-12-27 DIAGNOSIS — D649 Anemia, unspecified: Secondary | ICD-10-CM | POA: Diagnosis not present

## 2011-12-27 DIAGNOSIS — N184 Chronic kidney disease, stage 4 (severe): Secondary | ICD-10-CM | POA: Diagnosis not present

## 2011-12-27 DIAGNOSIS — E559 Vitamin D deficiency, unspecified: Secondary | ICD-10-CM | POA: Diagnosis not present

## 2012-03-27 DIAGNOSIS — D649 Anemia, unspecified: Secondary | ICD-10-CM | POA: Diagnosis not present

## 2012-03-27 DIAGNOSIS — R809 Proteinuria, unspecified: Secondary | ICD-10-CM | POA: Diagnosis not present

## 2012-03-27 DIAGNOSIS — Z79899 Other long term (current) drug therapy: Secondary | ICD-10-CM | POA: Diagnosis not present

## 2012-03-27 DIAGNOSIS — E559 Vitamin D deficiency, unspecified: Secondary | ICD-10-CM | POA: Diagnosis not present

## 2012-04-13 ENCOUNTER — Other Ambulatory Visit: Payer: Self-pay | Admitting: *Deleted

## 2012-04-13 DIAGNOSIS — T82598A Other mechanical complication of other cardiac and vascular devices and implants, initial encounter: Secondary | ICD-10-CM

## 2012-04-18 ENCOUNTER — Encounter: Payer: Self-pay | Admitting: Vascular Surgery

## 2012-04-19 ENCOUNTER — Ambulatory Visit (INDEPENDENT_AMBULATORY_CARE_PROVIDER_SITE_OTHER): Payer: Medicare Other | Admitting: Vascular Surgery

## 2012-04-19 ENCOUNTER — Encounter: Payer: Self-pay | Admitting: Vascular Surgery

## 2012-04-19 ENCOUNTER — Encounter (INDEPENDENT_AMBULATORY_CARE_PROVIDER_SITE_OTHER): Payer: Medicare Other | Admitting: *Deleted

## 2012-04-19 VITALS — BP 173/74 | HR 40 | Resp 16 | Ht 67.0 in | Wt 176.0 lb

## 2012-04-19 DIAGNOSIS — T82898A Other specified complication of vascular prosthetic devices, implants and grafts, initial encounter: Secondary | ICD-10-CM

## 2012-04-19 DIAGNOSIS — T82598A Other mechanical complication of other cardiac and vascular devices and implants, initial encounter: Secondary | ICD-10-CM

## 2012-04-19 DIAGNOSIS — N186 End stage renal disease: Secondary | ICD-10-CM

## 2012-04-19 NOTE — Progress Notes (Signed)
[facility] of [place]  Patient name: [name] MRN: [id number] DOB: August 19, 1930 Sex: male  REASON FOR VISIT: follow up of left radiocephalic AV fistula.  HPI: [name] is a 78 y.o. male who had a left radiocephalic AV fistula placed on 06/01/11. He had a fistulogram on 08/16/11. This demonstrated 4 competing branches in the fistula and also some irregularity in the proximal fistula over a length of 2 half centimeters. It was also some narrowing below the antecubital level. We have discussed possible surgical revision however he was reluctant to proceed. Comes in for follow up visit. He is not yet on dialysis. He has had some generalized fatigue but no other uremic symptoms.  REVIEW OF SYSTEMS: Valu.Nieves ] denotes positive finding; [  ] denotes negative finding  CARDIOVASCULAR:  [ ]  chest pain   [ ]  dyspnea on exertion    CONSTITUTIONAL:  [ ]  fever   [ ]  chills  PHYSICAL EXAM: Filed Vitals:   04/19/12 1640  BP: 173/74  Pulse: 40  Resp: 16  Height: 5\' 7"  (1.702 m)  Weight: 176 lb (79.833 kg)  SpO2: 98%   Body mass index is 27.57 kg/(m^2). GENERAL: The patient is a well-nourished male, in no acute distress. The vital signs are documented above. CARDIOVASCULAR: There is a regular rate and rhythm  PULMONARY: There is good air exchange bilaterally without wheezing or rales. The fistula has a good thrill proximally but then becomes pulsatile it appears to develop progressive narrowing below the antecubital level.  Duplex of his fistula today shows area in the vein proximal to the antecubital level with some possible partially occlusive thrombus. 3 competing branches are noted.  The arterial anastomosis appears patent.  MEDICAL ISSUES: I have recommended that we proceed with a fistulogram in order to evaluate him for possible venoplasty of progressive stenosis within the fistula below the antecubital level. If his branches have enlarged would also have to consider ligation  of competing branches. If the fistula could not be salvaged this would have to be converted to a basilic vein transposition. The forearm cephalic vein empties into the basilic system on the left. We will make further recommendations pending the results of his fistulogram which is scheduled for 12/31/2011.  [place] [facility] [place] Beeper: [phone]

## 2012-04-28 ENCOUNTER — Encounter (HOSPITAL_COMMUNITY): Payer: Self-pay | Admitting: Pharmacy Technician

## 2012-05-01 ENCOUNTER — Encounter (HOSPITAL_COMMUNITY): Payer: Self-pay

## 2012-05-08 ENCOUNTER — Other Ambulatory Visit: Payer: Self-pay

## 2012-05-15 ENCOUNTER — Ambulatory Visit (HOSPITAL_COMMUNITY)
Admission: RE | Admit: 2012-05-15 | Discharge: 2012-05-15 | Disposition: A | Discharge status: Home / Self Care | Payer: Medicare Other | Source: Ambulatory Visit | Attending: Vascular Surgery | Admitting: Vascular Surgery

## 2012-05-15 ENCOUNTER — Telehealth: Payer: Self-pay | Admitting: Vascular Surgery

## 2012-05-15 ENCOUNTER — Encounter (HOSPITAL_COMMUNITY)
Admission: RE | Disposition: A | Discharge status: Home / Self Care | Payer: Self-pay | Source: Ambulatory Visit | Attending: Vascular Surgery

## 2012-05-15 DIAGNOSIS — Y832 Surgical operation with anastomosis, bypass or graft as the cause of abnormal reaction of the patient, or of later complication, without mention of misadventure at the time of the procedure: Secondary | ICD-10-CM | POA: Insufficient documentation

## 2012-05-15 DIAGNOSIS — T82598A Other mechanical complication of other cardiac and vascular devices and implants, initial encounter: Secondary | ICD-10-CM | POA: Insufficient documentation

## 2012-05-15 DIAGNOSIS — N186 End stage renal disease: Secondary | ICD-10-CM | POA: Insufficient documentation

## 2012-05-15 DIAGNOSIS — T82898A Other specified complication of vascular prosthetic devices, implants and grafts, initial encounter: Secondary | ICD-10-CM

## 2012-05-15 DIAGNOSIS — I871 Compression of vein: Secondary | ICD-10-CM | POA: Insufficient documentation

## 2012-05-15 DIAGNOSIS — J449 Chronic obstructive pulmonary disease, unspecified: Secondary | ICD-10-CM | POA: Insufficient documentation

## 2012-05-15 DIAGNOSIS — J4489 Other specified chronic obstructive pulmonary disease: Secondary | ICD-10-CM | POA: Insufficient documentation

## 2012-05-15 DIAGNOSIS — I120 Hypertensive chronic kidney disease with stage 5 chronic kidney disease or end stage renal disease: Secondary | ICD-10-CM | POA: Insufficient documentation

## 2012-05-15 HISTORY — PX: SHUNTOGRAM: SHX5491

## 2012-05-15 LAB — POCT I-STAT, CHEM 8
Creatinine, Ser: 4.2 mg/dL — ABNORMAL HIGH (ref 0.50–1.35)
Glucose, Bld: 110 mg/dL — ABNORMAL HIGH (ref 70–99)
Hemoglobin: 11.2 g/dL — ABNORMAL LOW (ref 13.0–17.0)
Potassium: 4.9 mEq/L (ref 3.5–5.1)

## 2012-05-15 SURGERY — ASSESSMENT, SHUNT FUNCTION, WITH CONTRAST RADIOGRAPHIC STUDY
Anesthesia: LOCAL

## 2012-05-15 MED ORDER — SODIUM CHLORIDE 0.9 % IJ SOLN: 3.0000 mL | INTRAMUSCULAR | Status: DC | PRN

## 2012-05-15 MED ORDER — LIDOCAINE HCL (PF) 1 % IJ SOLN
INTRAMUSCULAR | Status: AC
  Filled 2012-05-15: qty 30

## 2012-05-15 MED ORDER — HEPARIN (PORCINE) IN NACL 2-0.9 UNIT/ML-% IJ SOLN
INTRAMUSCULAR | Status: AC
  Filled 2012-05-15: qty 500

## 2012-05-15 NOTE — Op Note (Addendum)
OPERATIVE NOTE   PROCEDURE: 1. left radiocephalic arteriovenous fistula cannulation under ultrasound guidance 2. left arm fistulogram  PRE-OPERATIVE DIAGNOSIS: Malfunctioning left arteriovenous fistula  POST-OPERATIVE DIAGNOSIS: same as above   SURGEON: Adele Barthel, MD  ANESTHESIA: local  ESTIMATED BLOOD LOSS: 5 cc  FINDING(S): Multiple >90% stenoses in mid-segment of left radiocephalic arteriovenous fistula  No upper arm cephalic vein visualized.  Few collateral veins drain into brachial vein. Primary drainage of left radiocephalic arteriovenous fistula via brachial vein system Short basilic vein with entry into brachial vein Patent brachial, axillary, and subclavian vein Poor visualization of left innominate vein without evidence of chest or neck collateral filling  SPECIMEN(S):  None  CONTRAST: 50 cc  INDICATIONS: [name] is a 79 y.o. male who  presents with malfunctioning left radiocephalic arteriovenous fistula.  The patient is scheduled for left arm fistulogram.  The patient is aware the risks include but are not limited to: bleeding, infection, thrombosis of the cannulated access, and possible anaphylactic reaction to the contrast.  The patient is aware of the risks of the procedure and elects to proceed forward.  DESCRIPTION: After full informed written consent was obtained, the patient was brought back to the angiography suite and placed supine upon the angiography table.  The patient was connected to monitoring equipment.  The left forearm was prepped and draped in the standard fashion for a left arm fistulogram.  Under ultrasound guidance, the left radiocephalic arteriovenous fistula was cannulated with a micropuncture needle.  The microwire was advanced into the fistula and the needle was exchanged for the a microsheath, which was lodged 2 cm into the access.  The wire was removed and the sheath was connected to the IV extension tubing.  Hand injections were  completed to image the access from the forearm up to the axilla.  The central venous structures were also imaged by hand injections.  Based on the images, this patient will need: possible ligation of left radiocephalic arteriovenous fistula and placement of left basilic vein transposition.  A 4-0 Monocryl purse-string suture was sewn around the sheath.  The sheath was removed while tying down the suture.  A sterile bandage was applied to the puncture site.  COMPLICATIONS: small hematoma at cannulation site  CONDITION: stable  Adele Barthel, MD [facility] [place] Office: [phone] Pager: [phone]  05/15/2012 12:41 PM

## 2012-05-15 NOTE — Telephone Encounter (Addendum)
Message copied by Lujean Amel on Mon May 15, 2012  2:10 PM ------      Message from: Alfonso Patten      Created: Mon May 15, 2012  1:41 PM                   ----- Message -----         From: Conrad Fromberg, MD         Sent: 05/15/2012  12:46 PM           To: Patrici Ranks, Alfonso Patten, RN            [name]      [id number]      1930-08-12            PROCEDURE:      1. left radiocephalic arteriovenous fistula cannulation under ultrasound guidance      2.  left arm fistulogram            Follow-up: 2-4 weeks w/ Dr. Scot Dock  I scheduled an appt for the above pt on 06/07/12 at 11:30am w/ [facility]. I left a message for the pt and also mailed a letter.awt

## 2012-05-15 NOTE — H&P (Signed)
[facility] [place]  Brief History and Physical  History of Present Illness  [name] is a 79 y.o. male who presents with chief complaint: malfunctioning left radiocephalic arteriovenous fistula .  The patient presents today for repeat L arm fistulogram, possible intervention.    Past Medical History  Diagnosis Date  . Cancer     lung  . Cancer     renal  . BPH (benign prostatic hypertrophy)   . GERD (gastroesophageal reflux disease)   . Hypertension     stress test done - many years ago  . Arthritis     ankle , hip, knees , low back   . 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 /w prednisone x 17 yrs. has been d/c for 3 yrs.   [name] Kitchen COPD (chronic obstructive pulmonary disease)     told that he has "a bit of emphysema"  . Chronic kidney disease     ESRD    Past Surgical History  Procedure Date  . Nephrectomy 2001  . Lung removal, partial     partial  . Ankle fusion     R ankle  . Cholecystectomy   . Joint replacement     L knee- 2010- [facility], L hip partial replacement   . Av fistula placement 06/01/2011    Procedure: ARTERIOVENOUS (AV) FISTULA CREATION;  Surgeon: Angelia Mould, MD;  Location: [facility] OR;  Service: Vascular;  Laterality: Left;  Creation of Left Arteriovenous fistula  . Fistulogram left radial cephalic av fistula 0000000      Dr. Scot Dock    History   Social History  . Marital Status: Married    Spouse Name: N/A    Number of Children: N/A  . Years of Education: N/A   Occupational History  . Not on file.   Social History Main Topics  . Smoking status: Former Smoker -- 35 years    Types: Cigarettes    Quit date: 02/04/1977  . Smokeless tobacco: Current User  . Alcohol Use: Yes     Comment: beer- x2/ mth  . Drug Use: No  . Sexually Active: Not on file   Other Topics Concern  . Not on file   Social History Narrative  . No narrative on file    Family History  Problem  Relation Age of Onset  . Anesthesia problems Neg Hx   . Hypotension Neg Hx   . Malignant hyperthermia Neg Hx   . Pseudochol deficiency Neg Hx   . Heart disease Mother   . Heart disease Father     No current facility-administered medications on file prior to encounter.   Current Outpatient Prescriptions on File Prior to Encounter  Medication Sig Dispense Refill  . amLODipine (NORVASC) 5 MG tablet Take 5 mg by mouth daily.        . calcitRIOL (ROCALTROL) 0.5 MCG capsule Take 0.5 mcg by mouth every Monday, Wednesday, and Friday.       . Cholecalciferol (VITAMIN D PO) Take 1 capsule by mouth daily.      . cyclobenzaprine (FLEXERIL) 10 MG tablet Take 10 mg by mouth daily as needed. For nasal drainage.      . doxazosin (CARDURA) 4 MG tablet Take 4 mg by mouth at bedtime.        . metoprolol succinate (TOPROL-XL) 25 MG 24 hr tablet Take 25 mg by mouth daily.        No Known Allergies  Review  of Systems: As listed above, otherwise negative.  Physical Examination  Filed Vitals:   05/15/12 0953 05/15/12 0956 05/15/12 0957  BP:   158/80  Pulse:   74  Temp:  98.1 F (36.7 C)   TempSrc:   Oral  Resp:   18  Height: 5\' 7"  (1.702 m)    Weight: 170 lb (77.111 kg)    SpO2:   99%    General: A&O x 3, WDWN  Pulmonary: Sym exp, good air movt, CTAB, no rales, rhonchi, & wheezing  Cardiac: RRR, Nl S1, S2, no Murmurs, rubs or gallops  Gastrointestinal: soft, NTND, -G/R, - HSM, - masses, - CVAT B  Musculoskeletal: M/S 5/5 throughout , Extremities without ischemic changes , pulsatile left radiocephalic arteriovenous fistula , palpable thrill   Laboratory See iStat  Medical Decision Making  [name] is a 79 y.o. male who presents with: malfunctioning left radiocephalic arteriovenous fistula     The patient is scheduled for: left arm fistulogram, possible intervention. I discussed with the patient the nature of angiographic procedures, especially the limited patencies of any  endovascular intervention.  The patient is aware of that the risks of an angiographic procedure include but are not limited to: bleeding, infection, access site complications, renal failure, embolization, rupture of vessel, dissection, possible need for emergent surgical intervention, possible need for surgical procedures to treat the patient's pathology, and stroke and death.    The patient is aware of the risks and agrees to proceed.  Adele Barthel, MD [facility] [place] Office: [phone] Pager: [phone]  05/15/2012, 11:36 AM

## 2012-06-06 ENCOUNTER — Encounter: Payer: Self-pay | Admitting: Vascular Surgery

## 2012-06-07 ENCOUNTER — Ambulatory Visit: Payer: Medicare Other | Admitting: Vascular Surgery

## 2012-06-12 DIAGNOSIS — Z79899 Other long term (current) drug therapy: Secondary | ICD-10-CM | POA: Diagnosis not present

## 2012-06-12 DIAGNOSIS — I10 Essential (primary) hypertension: Secondary | ICD-10-CM | POA: Diagnosis not present

## 2012-06-12 DIAGNOSIS — E559 Vitamin D deficiency, unspecified: Secondary | ICD-10-CM | POA: Diagnosis not present

## 2012-06-12 DIAGNOSIS — N400 Enlarged prostate without lower urinary tract symptoms: Secondary | ICD-10-CM | POA: Diagnosis not present

## 2012-06-12 DIAGNOSIS — D649 Anemia, unspecified: Secondary | ICD-10-CM | POA: Diagnosis not present

## 2012-06-12 DIAGNOSIS — N189 Chronic kidney disease, unspecified: Secondary | ICD-10-CM | POA: Diagnosis not present

## 2012-06-12 DIAGNOSIS — R809 Proteinuria, unspecified: Secondary | ICD-10-CM | POA: Diagnosis not present

## 2012-06-27 ENCOUNTER — Encounter: Payer: Self-pay | Admitting: Vascular Surgery

## 2012-06-28 ENCOUNTER — Other Ambulatory Visit: Payer: Self-pay

## 2012-06-28 ENCOUNTER — Ambulatory Visit (INDEPENDENT_AMBULATORY_CARE_PROVIDER_SITE_OTHER): Payer: Medicare Other | Admitting: Vascular Surgery

## 2012-06-28 ENCOUNTER — Encounter: Payer: Self-pay | Admitting: Vascular Surgery

## 2012-06-28 ENCOUNTER — Encounter (HOSPITAL_COMMUNITY): Payer: Self-pay | Admitting: Pharmacist

## 2012-06-28 VITALS — BP 166/72 | HR 42 | Ht 67.0 in | Wt 169.7 lb

## 2012-06-28 DIAGNOSIS — N186 End stage renal disease: Secondary | ICD-10-CM

## 2012-06-28 NOTE — Assessment & Plan Note (Signed)
His left radiocephalic fistula has diffuse narrowing and cannot be salvaged. Dr. Bridgett Larsson and I both agree that the next best option would be a basilic vein transposition on the left. He is agreeable to proceed with surgery and has requested that Dr. Bridgett Larsson do his surgery. We will schedule this at the earliest convenience.

## 2012-06-28 NOTE — Progress Notes (Signed)
[facility] [place]  Patient name: [name] MRN: [id number] DOB: Jun 12, 1930 Sex: male  REASON FOR VISIT: follow up after fistulogram.  HPI: [name] is a 79 y.o. male who had a left radiocephalic fistula placed on 06/01/11. He is not yet on dialysis. Postoperative duplex showed 2 areas of stenosis within the fistula and he underwent a fistulogram on 08/16/11. At that time he was noted to have 4 competing branches in his left forearm fistula and an area of stenosis over the proximal 2-1/2 cm of the fistula. There was also irregularity below the antecubital level over a distance of approximately 4 cm. There were no problems with the arterial anastomosis. I have discussed revision of his fistula however at that time he was not agreeable to proceed with surgery. I also explained that the other option would be to proceed with placement of a basilic vein transposition on the left. He was seen again in December of 2013 with a poorly maturing fistula. He was agreeable to proceed with a fistulogram which was performed by Dr. Adele Barthel on 05/15/2012. He had multiple areas of stenosis in the fistula was not a candidate for venoplasty. It was felt that he would likely require a basilic vein transposition on the left based on his fistulogram results. He comes in today to discuss those results. He has had no pain or paresthesias in the left arm. He is not yet on dialysis.  Past Medical History  Diagnosis Date  . Cancer     lung  . Cancer     renal  . BPH (benign prostatic hypertrophy)   . GERD (gastroesophageal reflux disease)   . Hypertension     stress test done - many years ago  . Arthritis     ankle , hip, knees , low back   . 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 /w prednisone x 17 yrs. has been d/c for 3 yrs.   [name] Kitchen COPD (chronic obstructive pulmonary disease)     told that he has "a bit of emphysema"  . Chronic kidney disease      ESRD    Family History  Problem Relation Age of Onset  . Anesthesia problems Neg Hx   . Hypotension Neg Hx   . Malignant hyperthermia Neg Hx   . Pseudochol deficiency Neg Hx   . Heart disease Mother   . Heart disease Father     SOCIAL HISTORY: History  Substance Use Topics  . Smoking status: Former Smoker -- 35 years    Types: Cigarettes    Quit date: 02/04/1977  . Smokeless tobacco: Current User    Types: Chew  . Alcohol Use: Yes     Comment: beer- x2/ mth    No Known Allergies  Current Outpatient Prescriptions  Medication Sig Dispense Refill  . alfuzosin (UROXATRAL) 10 MG 24 hr tablet Take 10 mg by mouth daily.      [name] Kitchen amLODipine (NORVASC) 5 MG tablet Take 5 mg by mouth daily.        . calcitRIOL (ROCALTROL) 0.5 MCG capsule Take 0.5 mcg by mouth every Monday, Wednesday, and Friday.       . Cholecalciferol (VITAMIN D PO) Take 1 capsule by mouth daily.      . cyclobenzaprine (FLEXERIL) 10 MG tablet Take 10 mg by mouth daily as needed. For nasal drainage.      . doxazosin (CARDURA) 4 MG tablet Take 4 mg by mouth  at bedtime.        . metoprolol succinate (TOPROL-XL) 25 MG 24 hr tablet Take 25 mg by mouth daily.      . sodium bicarbonate 325 MG tablet Take 650 mg by mouth 3 (three) times daily.       No current facility-administered medications for this visit.    REVIEW OF SYSTEMS: Valu.Nieves ] denotes positive finding; [  ] denotes negative finding  CARDIOVASCULAR:  [ ]  chest pain   [ ]  chest pressure   [ ]  palpitations   [ ]  orthopnea   [ ]  dyspnea on exertion   [ ]  claudication   [ ]  rest pain   [ ]  DVT   [ ]  phlebitis PULMONARY:   [ ]  productive cough   [ ]  asthma   [ ]  wheezing NEUROLOGIC:   [ ]  weakness  [ ]  paresthesias  [ ]  aphasia  [ ]  amaurosis  [ ]  dizziness HEMATOLOGIC:   [ ]  bleeding problems   [ ]  clotting disorders MUSCULOSKELETAL:  [ ]  joint pain   [ ]  joint swelling [ ]  leg swelling GASTROINTESTINAL: [ ]   blood in stool  [ ]   hematemesis GENITOURINARY:  [ ]    dysuria  [ ]   hematuria PSYCHIATRIC:  [ ]  history of major depression INTEGUMENTARY:  [ ]  rashes  [ ]  ulcers CONSTITUTIONAL:  [ ]  fever   [ ]  chills  PHYSICAL EXAM: Filed Vitals:   06/28/12 1059  BP: 166/72  Pulse: 42  Height: 5\' 7"  (1.702 m)  Weight: 169 lb 11.2 oz (76.975 kg)  SpO2: 99%   Body mass index is 26.57 kg/(m^2). GENERAL: The patient is a well-nourished male, in no acute distress. The vital signs are documented above. CARDIOVASCULAR: There is a regular rate and rhythm.  PULMONARY: There is good air exchange bilaterally without wheezing or rales. NEUROLOGIC: No focal weakness or paresthesias are detected. SKIN: There are no ulcers or rashes noted. PSYCHIATRIC: The patient has a normal affect. His left forearm fistula has a weak thrill area is pulsatile proximally. The cephalic vein empties into the basilic system.  DATA:  I have reviewed his fistulogram from 05/15/2012. This shows multiple areas of stenosis within his fistula with diffuse disease. The cephalic vein empties into the basilic system on the left. The basilic vein was fairly short and empties into the brachial vein.  MEDICAL ISSUES:  End stage renal disease His left radiocephalic fistula has diffuse narrowing and cannot be salvaged. Dr. Bridgett Larsson and I both agree that the next best option would be a basilic vein transposition on the left. He is agreeable to proceed with surgery and has requested that Dr. Bridgett Larsson do his surgery. We will schedule this at the earliest convenience.   [place] [facility] [place] Beeper: [phone]

## 2012-07-04 MED ORDER — DEXTROSE 5 % IV SOLN
1.5000 g | INTRAVENOUS | Status: DC
  Filled 2012-07-04: qty 1.5

## 2012-07-04 NOTE — Progress Notes (Signed)
Pt said that he is not having surgery in AM, he has a cold.  I called the hospital and the Dr's office  I notified Dr Scot Dock and OR.

## 2012-07-05 ENCOUNTER — Ambulatory Visit (HOSPITAL_COMMUNITY): Admission: RE | Admit: 2012-07-05 | Payer: Medicare Other | Source: Ambulatory Visit | Admitting: Vascular Surgery

## 2012-07-05 ENCOUNTER — Encounter (HOSPITAL_COMMUNITY): Admission: RE | Payer: Self-pay | Source: Ambulatory Visit

## 2012-07-05 SURGERY — TRANSPOSITION, VEIN, BASILIC
Anesthesia: General | Site: Arm Upper | Laterality: Left

## 2012-07-07 ENCOUNTER — Other Ambulatory Visit: Payer: Self-pay | Admitting: *Deleted

## 2012-07-10 ENCOUNTER — Encounter (HOSPITAL_COMMUNITY): Payer: Self-pay | Admitting: *Deleted

## 2012-07-12 ENCOUNTER — Encounter (HOSPITAL_COMMUNITY): Payer: Self-pay | Admitting: *Deleted

## 2012-07-12 ENCOUNTER — Telehealth: Payer: Self-pay | Admitting: Vascular Surgery

## 2012-07-12 ENCOUNTER — Ambulatory Visit (HOSPITAL_COMMUNITY): Payer: Medicare Other | Admitting: Anesthesiology

## 2012-07-12 ENCOUNTER — Ambulatory Visit (HOSPITAL_COMMUNITY)
Admission: RE | Admit: 2012-07-12 | Discharge: 2012-07-12 | Disposition: A | Discharge status: Home / Self Care | Payer: Medicare Other | Source: Ambulatory Visit | Attending: Vascular Surgery | Admitting: Vascular Surgery

## 2012-07-12 ENCOUNTER — Ambulatory Visit (HOSPITAL_COMMUNITY): Payer: Medicare Other

## 2012-07-12 ENCOUNTER — Encounter (HOSPITAL_COMMUNITY): Payer: Self-pay | Admitting: Anesthesiology

## 2012-07-12 ENCOUNTER — Encounter (HOSPITAL_COMMUNITY)
Admission: RE | Disposition: A | Discharge status: Home / Self Care | Payer: Self-pay | Source: Ambulatory Visit | Attending: Vascular Surgery

## 2012-07-12 DIAGNOSIS — I871 Compression of vein: Secondary | ICD-10-CM | POA: Insufficient documentation

## 2012-07-12 DIAGNOSIS — Z79899 Other long term (current) drug therapy: Secondary | ICD-10-CM | POA: Insufficient documentation

## 2012-07-12 DIAGNOSIS — N186 End stage renal disease: Secondary | ICD-10-CM | POA: Insufficient documentation

## 2012-07-12 DIAGNOSIS — Z85528 Personal history of other malignant neoplasm of kidney: Secondary | ICD-10-CM | POA: Insufficient documentation

## 2012-07-12 DIAGNOSIS — T82898A Other specified complication of vascular prosthetic devices, implants and grafts, initial encounter: Secondary | ICD-10-CM | POA: Insufficient documentation

## 2012-07-12 DIAGNOSIS — G6181 Chronic inflammatory demyelinating polyneuritis: Secondary | ICD-10-CM | POA: Insufficient documentation

## 2012-07-12 DIAGNOSIS — G709 Myoneural disorder, unspecified: Secondary | ICD-10-CM | POA: Insufficient documentation

## 2012-07-12 DIAGNOSIS — F17200 Nicotine dependence, unspecified, uncomplicated: Secondary | ICD-10-CM | POA: Insufficient documentation

## 2012-07-12 DIAGNOSIS — Y832 Surgical operation with anastomosis, bypass or graft as the cause of abnormal reaction of the patient, or of later complication, without mention of misadventure at the time of the procedure: Secondary | ICD-10-CM | POA: Insufficient documentation

## 2012-07-12 DIAGNOSIS — K219 Gastro-esophageal reflux disease without esophagitis: Secondary | ICD-10-CM | POA: Insufficient documentation

## 2012-07-12 DIAGNOSIS — N185 Chronic kidney disease, stage 5: Secondary | ICD-10-CM

## 2012-07-12 DIAGNOSIS — I120 Hypertensive chronic kidney disease with stage 5 chronic kidney disease or end stage renal disease: Secondary | ICD-10-CM | POA: Insufficient documentation

## 2012-07-12 DIAGNOSIS — J449 Chronic obstructive pulmonary disease, unspecified: Secondary | ICD-10-CM | POA: Insufficient documentation

## 2012-07-12 DIAGNOSIS — Z85118 Personal history of other malignant neoplasm of bronchus and lung: Secondary | ICD-10-CM | POA: Insufficient documentation

## 2012-07-12 DIAGNOSIS — J4489 Other specified chronic obstructive pulmonary disease: Secondary | ICD-10-CM | POA: Insufficient documentation

## 2012-07-12 DIAGNOSIS — N400 Enlarged prostate without lower urinary tract symptoms: Secondary | ICD-10-CM | POA: Insufficient documentation

## 2012-07-12 HISTORY — PX: AV FISTULA PLACEMENT: SHX1204

## 2012-07-12 HISTORY — DX: Calculus of kidney: N20.0

## 2012-07-12 LAB — POCT I-STAT 4, (NA,K, GLUC, HGB,HCT)
Glucose, Bld: 103 mg/dL — ABNORMAL HIGH (ref 70–99)
HCT: 34 % — ABNORMAL LOW (ref 39.0–52.0)
Potassium: 4.4 mEq/L (ref 3.5–5.1)

## 2012-07-12 LAB — SURGICAL PCR SCREEN
MRSA, PCR: NEGATIVE
Staphylococcus aureus: NEGATIVE

## 2012-07-12 SURGERY — ARTERIOVENOUS (AV) FISTULA CREATION
Anesthesia: Monitor Anesthesia Care | Site: Arm Lower | Laterality: Left | Wound class: Clean

## 2012-07-12 MED ORDER — MUPIROCIN 2 % EX OINT
TOPICAL_OINTMENT | Freq: Two times a day (BID) | CUTANEOUS | Status: DC
  Filled 2012-07-12 (×2): qty 22

## 2012-07-12 MED ORDER — SODIUM CHLORIDE 0.9 % IR SOLN
Status: DC | PRN
  Administered 2012-07-12: 12:00:00

## 2012-07-12 MED ORDER — SODIUM CHLORIDE 0.9 % IV SOLN
INTRAVENOUS | Status: DC | PRN
  Administered 2012-07-12: 11:00:00 via INTRAVENOUS

## 2012-07-12 MED ORDER — SODIUM CHLORIDE 0.9 % IV SOLN
INTRAVENOUS | Status: DC
  Administered 2012-07-12: 10:00:00 via INTRAVENOUS

## 2012-07-12 MED ORDER — PROPOFOL 10 MG/ML IV BOLUS
INTRAVENOUS | Status: DC | PRN
  Administered 2012-07-12 (×2): 8 mg via INTRAVENOUS
  Administered 2012-07-12: 20 mg via INTRAVENOUS

## 2012-07-12 MED ORDER — PROMETHAZINE HCL 25 MG/ML IJ SOLN: 6.2500 mg | INTRAMUSCULAR | Status: DC | PRN

## 2012-07-12 MED ORDER — LIDOCAINE-EPINEPHRINE (PF) 1 %-1:200000 IJ SOLN
INTRAMUSCULAR | Status: DC | PRN
  Administered 2012-07-12: 4 mL

## 2012-07-12 MED ORDER — OXYCODONE HCL 5 MG/5ML PO SOLN: 5.0000 mg | Freq: Once | ORAL | Status: DC | PRN

## 2012-07-12 MED ORDER — PROPOFOL INFUSION 10 MG/ML OPTIME
INTRAVENOUS | Status: DC | PRN
  Administered 2012-07-12: 100 ug/kg/min via INTRAVENOUS

## 2012-07-12 MED ORDER — FENTANYL CITRATE 0.05 MG/ML IJ SOLN
INTRAMUSCULAR | Status: DC | PRN
  Administered 2012-07-12: 100 ug via INTRAVENOUS

## 2012-07-12 MED ORDER — LIDOCAINE-EPINEPHRINE (PF) 1 %-1:200000 IJ SOLN
INTRAMUSCULAR | Status: AC
  Filled 2012-07-12: qty 10

## 2012-07-12 MED ORDER — OXYCODONE HCL 5 MG PO TABS: 5.0000 mg | ORAL_TABLET | ORAL | Status: DC | PRN

## 2012-07-12 MED ORDER — CEFAZOLIN SODIUM-DEXTROSE 2-3 GM-% IV SOLR
INTRAVENOUS | Status: AC
  Administered 2012-07-12: 2 g via INTRAVENOUS
  Filled 2012-07-12: qty 50

## 2012-07-12 MED ORDER — OXYCODONE HCL 5 MG PO TABS: 5.0000 mg | ORAL_TABLET | Freq: Once | ORAL | Status: DC | PRN

## 2012-07-12 MED ORDER — EPHEDRINE SULFATE 50 MG/ML IJ SOLN
INTRAMUSCULAR | Status: DC | PRN
  Administered 2012-07-12: 10 mg via INTRAVENOUS

## 2012-07-12 MED ORDER — 0.9 % SODIUM CHLORIDE (POUR BTL) OPTIME
TOPICAL | Status: DC | PRN
  Administered 2012-07-12: 1000 mL

## 2012-07-12 MED ORDER — HYDROMORPHONE HCL PF 1 MG/ML IJ SOLN: 0.2500 mg | INTRAMUSCULAR | Status: DC | PRN

## 2012-07-12 MED ORDER — BUPIVACAINE HCL (PF) 0.5 % IJ SOLN
INTRAMUSCULAR | Status: DC | PRN
  Administered 2012-07-12: 4 mL

## 2012-07-12 MED ORDER — BUPIVACAINE HCL (PF) 0.5 % IJ SOLN
INTRAMUSCULAR | Status: AC
  Filled 2012-07-12: qty 30

## 2012-07-12 MED ORDER — SODIUM CHLORIDE 0.9 % IV SOLN: INTRAVENOUS | Status: DC

## 2012-07-12 SURGICAL SUPPLY — 43 items
ADH SKN CLS APL DERMABOND .7 (GAUZE/BANDAGES/DRESSINGS) ×2
ADH SKN CLS LQ APL DERMABOND (GAUZE/BANDAGES/DRESSINGS) ×2
CANISTER SUCTION 2500CC (MISCELLANEOUS) ×3 IMPLANT
CLIP TI MEDIUM 24 (CLIP) ×3 IMPLANT
CLIP TI WIDE RED SMALL 24 (CLIP) ×3 IMPLANT
CLOTH BEACON ORANGE TIMEOUT ST (SAFETY) ×3 IMPLANT
CORDS BIPOLAR (ELECTRODE) IMPLANT
COVER PROBE W GEL 5X96 (DRAPES) IMPLANT
COVER SURGICAL LIGHT HANDLE (MISCELLANEOUS) ×3 IMPLANT
DECANTER SPIKE VIAL GLASS SM (MISCELLANEOUS) ×3 IMPLANT
DERMABOND ADHESIVE PROPEN (GAUZE/BANDAGES/DRESSINGS) ×1
DERMABOND ADVANCED (GAUZE/BANDAGES/DRESSINGS) ×1
DERMABOND ADVANCED .7 DNX12 (GAUZE/BANDAGES/DRESSINGS) ×2 IMPLANT
DERMABOND ADVANCED .7 DNX6 (GAUZE/BANDAGES/DRESSINGS) ×1 IMPLANT
ELECT REM PT RETURN 9FT ADLT (ELECTROSURGICAL) ×3
ELECTRODE REM PT RTRN 9FT ADLT (ELECTROSURGICAL) ×2 IMPLANT
GLOVE BIO SURGEON STRL SZ7 (GLOVE) ×3 IMPLANT
GLOVE BIOGEL PI IND STRL 6.5 (GLOVE) ×1 IMPLANT
GLOVE BIOGEL PI IND STRL 7.5 (GLOVE) ×2 IMPLANT
GLOVE BIOGEL PI INDICATOR 6.5 (GLOVE) ×1
GLOVE BIOGEL PI INDICATOR 7.5 (GLOVE) ×1
GLOVE ECLIPSE 6.5 STRL STRAW (GLOVE) ×2 IMPLANT
GLOVE SS BIOGEL STRL SZ 7.5 (GLOVE) ×1 IMPLANT
GLOVE SS N UNI LF 7.0 STRL (GLOVE) ×2 IMPLANT
GLOVE SUPERSENSE BIOGEL SZ 7.5 (GLOVE) ×1
GOWN STRL NON-REIN LRG LVL3 (GOWN DISPOSABLE) ×6 IMPLANT
GOWN STRL REIN XL XLG (GOWN DISPOSABLE) ×2 IMPLANT
KIT BASIN OR (CUSTOM PROCEDURE TRAY) ×3 IMPLANT
KIT ROOM TURNOVER OR (KITS) ×3 IMPLANT
NS IRRIG 1000ML POUR BTL (IV SOLUTION) ×3 IMPLANT
PACK CV ACCESS (CUSTOM PROCEDURE TRAY) ×3 IMPLANT
PAD ARMBOARD 7.5X6 YLW CONV (MISCELLANEOUS) ×6 IMPLANT
SPONGE SURGIFOAM ABS GEL 100 (HEMOSTASIS) IMPLANT
SUT MNCRL AB 4-0 PS2 18 (SUTURE) ×3 IMPLANT
SUT PROLENE 6 0 BV (SUTURE) IMPLANT
SUT PROLENE 7 0 BV 1 (SUTURE) ×3 IMPLANT
SUT SILK 2 0 SH (SUTURE) ×3 IMPLANT
SUT VIC AB 3-0 SH 27 (SUTURE) ×3
SUT VIC AB 3-0 SH 27X BRD (SUTURE) ×2 IMPLANT
TOWEL OR 17X24 6PK STRL BLUE (TOWEL DISPOSABLE) ×3 IMPLANT
TOWEL OR 17X26 10 PK STRL BLUE (TOWEL DISPOSABLE) ×3 IMPLANT
UNDERPAD 30X30 INCONTINENT (UNDERPADS AND DIAPERS) ×3 IMPLANT
WATER STERILE IRR 1000ML POUR (IV SOLUTION) ×3 IMPLANT

## 2012-07-12 NOTE — Transfer of Care (Addendum)
Immediate Anesthesia Transfer of Care Note  Patient: [name]  Procedure(s) Performed: Procedure(s): ARTERIOVENOUS (AV) FISTULA CREATION (Left)  Patient Location: PACU  Anesthesia Type:General  Level of Consciousness: awake, alert  and oriented  Airway & Oxygen Therapy: Patient Spontanous Breathing and Patient connected to face mask oxygen  Post-op Assessment: Report given to PACU RN, Post -op Vital signs reviewed and stable and Patient moving all extremities  Post vital signs: Reviewed and stable  Complications: No apparent anesthesia complications

## 2012-07-12 NOTE — Anesthesia Postprocedure Evaluation (Deleted)
Anesthesia Post Note  Patient: [name]  Procedure(s) Performed: Procedure(s) (LRB): BASCILIC VEIN TRANSPOSITION (Left)  Anesthesia type: MAC  Patient location: PACU  Post pain: Pain level controlled  Post assessment: Patient's Cardiovascular Status Stable  Last Vitals:  Filed Vitals:   07/12/12 0805  BP: 168/82  Pulse: 57  Temp: 36.1 C  Resp: 18    Post vital signs: Reviewed and stable  Level of consciousness: sedated  Complications: No apparent anesthesia complications

## 2012-07-12 NOTE — Telephone Encounter (Addendum)
Message copied by Lujean Amel on Wed Jul 12, 2012  3:39 PM ------      Message from: Alfonso Patten      Created: Wed Jul 12, 2012  1:23 PM                   ----- Message -----         From: Conrad [place], MD         Sent: 07/12/2012  12:27 PM           To: Patrici Ranks, Alfonso Patten, RN            [name]      [id number]      February 13, 1931            PROCEDURE:      left first stage basilic vein transposition (brachiobasilic arteriovenous fistula) placement            Asst: Gerri Lins, [facility]             Follow-up: 4-6 weeks ------ I scheduled an appt for the above pt on 08/18/12 at 11:15am w/ [facility]. I left a message for the pt [id number] and I also mailed an appt letter/awt

## 2012-07-12 NOTE — Preoperative (Signed)
Beta Blockers   Reason not to administer Beta Blockers:Not Applicable 

## 2012-07-12 NOTE — Op Note (Signed)
OPERATIVE NOTE   PROCEDURE: 1. left first stage basilic vein transposition (brachiobasilic arteriovenous fistula) placement  PRE-OPERATIVE DIAGNOSIS: chronic kidney disease stage V   POST-OPERATIVE DIAGNOSIS: same as above   SURGEON: Adele Barthel, MD  ASSISTANT(S): Gerri Lins, PAC   ANESTHESIA: local and MAC  ESTIMATED BLOOD LOSS: 30 cc  FINDING(S): 1. Palpable thrill and dopplerable radial signal at end of case  SPECIMEN(S):  none  INDICATIONS:   [name] is a 79 y.o. male who presents with chronic kidney disease stage V.  The patient previous left radiocephalic arteriovenous fistula had thrombosed and on venogram it appeared he was a good candidate for a stage basilic vein transposition.  The patient is scheduled for left first stage basilic vein transposition.  The patient is aware the risks include but are not limited to: bleeding, infection, steal syndrome, nerve damage, ischemic monomelic neuropathy, failure to mature, and need for additional procedures.  The patient is aware of the risks of the procedure and elects to proceed forward.  DESCRIPTION: After full informed written consent was obtained from the patient, the patient was brought back to the operating room and placed supine upon the operating table.  Prior to induction, the patient received IV antibiotics.   After obtaining adequate anesthesia, the patient was then prepped and draped in the standard fashion for a left arm access procedure.  I turned my attention first to identifying the patient's basilic vein and brachial artery.  Using SonoSite guidance, the location of these vessels were marked out on the skin.   At this point, I injected local anesthetic to obtain a field block of the antecubitum.  In total, I injected about 5 mL of a 1:1 mixture of 0.5% Marcaine without epinephrine and 1% lidocaine with epinephrine.  I made an oblique incision at the level of the antecubitum and dissected through the  subcutaneous tissue and fascia to gain exposure of the brachial artery.  This was noted to be 4-5 mm in diameter externally.  This was dissected out proximally and distally and controlled with vessel loops .  I then dissected out the basilic vein.  This was noted to be 4-5 mm in diameter externally.  The distal segment of the vein was ligated with a  2-0 silk, and the vein was transected.  The proximal segment was interrogated with serial dilators.  The vein accepted up to a 5 mm dilator without any difficulty.  I then instilled the heparinized saline into the vein and clamped it.  At this point, I reset my exposure of the brachial artery and placed the artery under tension proximally and distally.  I made an arteriotomy with a #11 blade, and then I extended the arteriotomy with a Potts scissor.  I injected heparinized saline proximal and distal to this arteriotomy.  The vein was then sewn to the artery in an end-to-side configuration with a running stitch of 7-0 Prolene.  Prior to completing this anastomosis, I allowed the vein and artery to backbleed.  There was no evidence of clot from any vessels.  I completed the anastomosis in the usual fashion and then released all vessel loops and clamps.  There was a palpable thrill in the venous outflow, and there was a dopplerable radial pulse.  At this point, I irrigated out the surgical wound.  There was no further active bleeding.  The subcutaneous tissue was reapproximated with a running stitch of 3-0 Vicryl.  The skin was then reapproximated with a running subcuticular stitch  of 4-0 Vicryl.  The skin was then cleaned, dried, and reinforced with Dermabond.  The patient tolerated this procedure well.   COMPLICATIONS: none  CONDITION: stable  Adele Barthel, MD [facility] [place] Office: [phone] Pager: [phone]  07/12/2012, 12:25 PM

## 2012-07-12 NOTE — H&P (View-Only) (Signed)
[facility] [place]  Patient name: [name] MRN: [id number] DOB: 1931-02-24 Sex: male  REASON FOR VISIT: follow up after fistulogram.  HPI: [name] is a 79 y.o. male who had a left radiocephalic fistula placed on 06/01/11. He is not yet on dialysis. Postoperative duplex showed 2 areas of stenosis within the fistula and he underwent a fistulogram on 08/16/11. At that time he was noted to have 4 competing branches in his left forearm fistula and an area of stenosis over the proximal 2-1/2 cm of the fistula. There was also irregularity below the antecubital level over a distance of approximately 4 cm. There were no problems with the arterial anastomosis. I have discussed revision of his fistula however at that time he was not agreeable to proceed with surgery. I also explained that the other option would be to proceed with placement of a basilic vein transposition on the left. He was seen again in December of 2013 with a poorly maturing fistula. He was agreeable to proceed with a fistulogram which was performed by Dr. Adele Barthel on 05/15/2012. He had multiple areas of stenosis in the fistula was not a candidate for venoplasty. It was felt that he would likely require a basilic vein transposition on the left based on his fistulogram results. He comes in today to discuss those results. He has had no pain or paresthesias in the left arm. He is not yet on dialysis.  Past Medical History  Diagnosis Date  . Cancer     lung  . Cancer     renal  . BPH (benign prostatic hypertrophy)   . GERD (gastroesophageal reflux disease)   . Hypertension     stress test done - many years ago  . Arthritis     ankle , hip, knees , low back   . 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 /w prednisone x 17 yrs. has been d/c for 3 yrs.   [name] Kitchen COPD (chronic obstructive pulmonary disease)     told that he has "a bit of emphysema"  . Chronic kidney disease      ESRD    Family History  Problem Relation Age of Onset  . Anesthesia problems Neg Hx   . Hypotension Neg Hx   . Malignant hyperthermia Neg Hx   . Pseudochol deficiency Neg Hx   . Heart disease Mother   . Heart disease Father     SOCIAL HISTORY: History  Substance Use Topics  . Smoking status: Former Smoker -- 35 years    Types: Cigarettes    Quit date: 02/04/1977  . Smokeless tobacco: Current User    Types: Chew  . Alcohol Use: Yes     Comment: beer- x2/ mth    No Known Allergies  Current Outpatient Prescriptions  Medication Sig Dispense Refill  . alfuzosin (UROXATRAL) 10 MG 24 hr tablet Take 10 mg by mouth daily.      [name] Kitchen amLODipine (NORVASC) 5 MG tablet Take 5 mg by mouth daily.        . calcitRIOL (ROCALTROL) 0.5 MCG capsule Take 0.5 mcg by mouth every Monday, Wednesday, and Friday.       . Cholecalciferol (VITAMIN D PO) Take 1 capsule by mouth daily.      . cyclobenzaprine (FLEXERIL) 10 MG tablet Take 10 mg by mouth daily as needed. For nasal drainage.      . doxazosin (CARDURA) 4 MG tablet Take 4 mg by mouth  at bedtime.        . metoprolol succinate (TOPROL-XL) 25 MG 24 hr tablet Take 25 mg by mouth daily.      . sodium bicarbonate 325 MG tablet Take 650 mg by mouth 3 (three) times daily.       No current facility-administered medications for this visit.    REVIEW OF SYSTEMS: Valu.Nieves ] denotes positive finding; [  ] denotes negative finding  CARDIOVASCULAR:  [ ]  chest pain   [ ]  chest pressure   [ ]  palpitations   [ ]  orthopnea   [ ]  dyspnea on exertion   [ ]  claudication   [ ]  rest pain   [ ]  DVT   [ ]  phlebitis PULMONARY:   [ ]  productive cough   [ ]  asthma   [ ]  wheezing NEUROLOGIC:   [ ]  weakness  [ ]  paresthesias  [ ]  aphasia  [ ]  amaurosis  [ ]  dizziness HEMATOLOGIC:   [ ]  bleeding problems   [ ]  clotting disorders MUSCULOSKELETAL:  [ ]  joint pain   [ ]  joint swelling [ ]  leg swelling GASTROINTESTINAL: [ ]   blood in stool  [ ]   hematemesis GENITOURINARY:  [ ]    dysuria  [ ]   hematuria PSYCHIATRIC:  [ ]  history of major depression INTEGUMENTARY:  [ ]  rashes  [ ]  ulcers CONSTITUTIONAL:  [ ]  fever   [ ]  chills  PHYSICAL EXAM: Filed Vitals:   06/28/12 1059  BP: 166/72  Pulse: 42  Height: 5\' 7"  (1.702 m)  Weight: 169 lb 11.2 oz (76.975 kg)  SpO2: 99%   Body mass index is 26.57 kg/(m^2). GENERAL: The patient is a well-nourished male, in no acute distress. The vital signs are documented above. CARDIOVASCULAR: There is a regular rate and rhythm.  PULMONARY: There is good air exchange bilaterally without wheezing or rales. NEUROLOGIC: No focal weakness or paresthesias are detected. SKIN: There are no ulcers or rashes noted. PSYCHIATRIC: The patient has a normal affect. His left forearm fistula has a weak thrill area is pulsatile proximally. The cephalic vein empties into the basilic system.  DATA:  I have reviewed his fistulogram from 05/15/2012. This shows multiple areas of stenosis within his fistula with diffuse disease. The cephalic vein empties into the basilic system on the left. The basilic vein was fairly short and empties into the brachial vein.  MEDICAL ISSUES:  End stage renal disease His left radiocephalic fistula has diffuse narrowing and cannot be salvaged. Dr. Bridgett Larsson and I both agree that the next best option would be a basilic vein transposition on the left. He is agreeable to proceed with surgery and has requested that Dr. Bridgett Larsson do his surgery. We will schedule this at the earliest convenience.   [place] [facility] [place] Beeper: [phone]

## 2012-07-12 NOTE — Interval H&P Note (Signed)
[facility] [place]  History and Physical Update  The patient was interviewed and re-examined.  The patient's previous History and Physical has been reviewed and is unchanged from Dr. Nicole Cella consult on: 06/28/12.  There is no change in the plan of care: ligation of left radiocephalic arteriovenous fistula and placement of basilic vein transposition.  Adele Barthel, MD [facility] [place] Office: [phone] Pager: [phone]  07/12/2012, 8:16 AM

## 2012-07-12 NOTE — Anesthesia Preprocedure Evaluation (Addendum)
Anesthesia Evaluation  Patient identified by MRN, date of birth, ID band Patient awake    Reviewed: Allergy & Precautions, H&P , NPO status , Patient's Chart, lab work & pertinent test results  History of Anesthesia Complications Negative for: history of anesthetic complications  Airway Mallampati: II TM Distance: >3 FB Neck ROM: Full    Dental  (+) Poor Dentition and Dental Advisory Given   Pulmonary COPD COPD inhaler,    Pulmonary exam normal       Cardiovascular hypertension,     Neuro/Psych  Neuromuscular disease    GI/Hepatic Neg liver ROS, GERD-  Medicated,  Endo/Other  negative endocrine ROS  Renal/GU ESRFRenal disease     Musculoskeletal   Abdominal   Peds  Hematology   Anesthesia Other Findings   Reproductive/Obstetrics                          Anesthesia Physical Anesthesia Plan  ASA: III  Anesthesia Plan: MAC   Post-op Pain Management:    Induction: Intravenous  Airway Management Planned: Simple Face Mask  Additional Equipment:   Intra-op Plan:   Post-operative Plan:   Informed Consent: I have reviewed the patients History and Physical, chart, labs and discussed the procedure including the risks, benefits and alternatives for the proposed anesthesia with the patient or authorized representative who has indicated his/her understanding and acceptance.   Dental advisory given  Plan Discussed with: CRNA, Anesthesiologist and Surgeon  Anesthesia Plan Comments:        Anesthesia Quick Evaluation

## 2012-07-13 ENCOUNTER — Encounter (HOSPITAL_COMMUNITY): Payer: Self-pay | Admitting: Vascular Surgery

## 2012-07-13 NOTE — Anesthesia Postprocedure Evaluation (Signed)
Anesthesia Post Note  Patient: [name]  Procedure(s) Performed: Procedure(s) (LRB): ARTERIOVENOUS (AV) FISTULA CREATION (Left)  Anesthesia type: MAC  Patient location: PACU  Post pain: Pain level controlled  Post assessment: Patient's Cardiovascular Status Stable  Last Vitals:  Filed Vitals:   07/12/12 1323  BP: 136/71  Pulse: 61  Temp: 36.3 C  Resp: 16    Post vital signs: Reviewed and stable  Level of consciousness: sedated  Complications: No apparent anesthesia complications

## 2012-07-27 DIAGNOSIS — Z79899 Other long term (current) drug therapy: Secondary | ICD-10-CM | POA: Diagnosis not present

## 2012-07-27 DIAGNOSIS — R809 Proteinuria, unspecified: Secondary | ICD-10-CM | POA: Diagnosis not present

## 2012-07-27 DIAGNOSIS — E559 Vitamin D deficiency, unspecified: Secondary | ICD-10-CM | POA: Diagnosis not present

## 2012-07-27 DIAGNOSIS — N184 Chronic kidney disease, stage 4 (severe): Secondary | ICD-10-CM | POA: Diagnosis not present

## 2012-07-27 DIAGNOSIS — D649 Anemia, unspecified: Secondary | ICD-10-CM | POA: Diagnosis not present

## 2012-08-17 ENCOUNTER — Encounter: Payer: Self-pay | Admitting: Vascular Surgery

## 2012-08-18 ENCOUNTER — Ambulatory Visit (INDEPENDENT_AMBULATORY_CARE_PROVIDER_SITE_OTHER): Payer: Medicare Other | Admitting: Vascular Surgery

## 2012-08-18 ENCOUNTER — Other Ambulatory Visit: Payer: Self-pay

## 2012-08-18 ENCOUNTER — Encounter: Payer: Self-pay | Admitting: Vascular Surgery

## 2012-08-18 VITALS — BP 158/79 | HR 54 | Resp 16 | Ht 67.0 in | Wt 169.0 lb

## 2012-08-18 DIAGNOSIS — N186 End stage renal disease: Secondary | ICD-10-CM

## 2012-08-18 DIAGNOSIS — Z48812 Encounter for surgical aftercare following surgery on the circulatory system: Secondary | ICD-10-CM

## 2012-08-18 HISTORY — PX: AV FISTULA PLACEMENT: SHX1204

## 2012-08-18 NOTE — Progress Notes (Signed)
[facility] [place]  Postoperative Access Visit  History of Present Illness  [name] is a 79 y.o. year old male who presents for postoperative follow-up for: L 1st BVT (Date: 07/12/12).  The patient's wounds are healed.  The patient notes mild steal symptoms.  The patient is able to complete their activities of daily living.  The patient's current symptoms are: mild finger numbness.  These sx are already improving.  Physical Examination  Filed Vitals:   08/18/12 1138  BP: 158/79  Pulse: 54  Resp: 16   LUE: Incision is healed, skin feels warm, hand grip is 5/5, sensation in digits is intact, palpable thrill, bruit can be auscultated   Medical Decision Making  [name] is a 79 y.o. year old male who presents s/p L 1st BVT.  The patient will follow up in one month for formal access duplex on the L 1st BVT to determine if its ready for transposition.  Thank you for allowing [place] to participate in this patient's care.  Adele Barthel, MD [facility] [place] Office: [phone] Pager: [phone]

## 2012-08-24 ENCOUNTER — Encounter (HOSPITAL_COMMUNITY): Payer: Self-pay

## 2012-08-24 ENCOUNTER — Encounter (HOSPITAL_COMMUNITY)
Admission: RE | Admit: 2012-08-24 | Discharge: 2012-08-24 | Disposition: A | Discharge status: Home / Self Care | Payer: Medicare Other | Source: Ambulatory Visit | Attending: Vascular Surgery | Admitting: Vascular Surgery

## 2012-08-24 LAB — SURGICAL PCR SCREEN
MRSA, PCR: NEGATIVE
Staphylococcus aureus: NEGATIVE

## 2012-08-24 NOTE — Progress Notes (Signed)
Ekg,cxr 3/14 in epic

## 2012-08-24 NOTE — Pre-Procedure Instructions (Addendum)
[name]  08/24/2012   Your procedure is scheduled on: 08/28/12  Report to [facility] at 530 AM.  Call this number if you have problems the morning of surgery: [phone]   Remember:   Do not eat food or drink liquids after midnight.   Take these medicines the morning of surgery with A SIP OF WATER: pain med,  amlodipine, doxazosin, metoprolol   Do not wear jewelry, make-up or nail polish.  Do not wear lotions, powders, or perfumes. You may wear deodorant.  Do not shave 48 hours prior to surgery. Men may shave face and neck.  Do not bring valuables to the hospital.  Contacts, dentures or bridgework may not be worn into surgery.  Leave suitcase in the car. After surgery it may be brought to your room.  For patients admitted to the hospital, checkout time is 11:00 AM the day of  discharge.   Patients discharged the day of surgery will not be allowed to drive  home.  Name and phone number of your driver:   Special Instructions: Shower using CHG 2 nights before surgery and the night before surgery.  If you shower the day of surgery use CHG.  Use special wash - you have one bottle of CHG for all showers.  You should use approximately 1/3 of the bottle for each shower.   Please read over the following fact sheets that you were given: Pain Booklet, Coughing and Deep Breathing, MRSA Information and Surgical Site Infection Prevention

## 2012-08-27 MED ORDER — DEXTROSE 5 % IV SOLN
1.5000 g | INTRAVENOUS | Status: AC
  Administered 2012-08-28: 1.5 g via INTRAVENOUS
  Filled 2012-08-27: qty 1.5

## 2012-08-28 ENCOUNTER — Ambulatory Visit (HOSPITAL_COMMUNITY)
Admission: RE | Admit: 2012-08-28 | Discharge: 2012-08-28 | Disposition: A | Discharge status: Home / Self Care | Payer: Medicare Other | Source: Ambulatory Visit | Attending: Vascular Surgery | Admitting: Vascular Surgery

## 2012-08-28 ENCOUNTER — Encounter (HOSPITAL_COMMUNITY)
Admission: RE | Disposition: A | Discharge status: Home / Self Care | Payer: Self-pay | Source: Ambulatory Visit | Attending: Vascular Surgery

## 2012-08-28 ENCOUNTER — Encounter: Payer: Self-pay | Admitting: Vascular Surgery

## 2012-08-28 ENCOUNTER — Ambulatory Visit (HOSPITAL_COMMUNITY): Payer: Medicare Other | Admitting: Certified Registered"

## 2012-08-28 ENCOUNTER — Telehealth: Payer: Self-pay | Admitting: Vascular Surgery

## 2012-08-28 ENCOUNTER — Encounter (HOSPITAL_COMMUNITY): Payer: Self-pay | Admitting: Certified Registered"

## 2012-08-28 ENCOUNTER — Encounter (HOSPITAL_COMMUNITY): Payer: Self-pay | Admitting: *Deleted

## 2012-08-28 DIAGNOSIS — Y832 Surgical operation with anastomosis, bypass or graft as the cause of abnormal reaction of the patient, or of later complication, without mention of misadventure at the time of the procedure: Secondary | ICD-10-CM | POA: Insufficient documentation

## 2012-08-28 DIAGNOSIS — J449 Chronic obstructive pulmonary disease, unspecified: Secondary | ICD-10-CM | POA: Insufficient documentation

## 2012-08-28 DIAGNOSIS — R209 Unspecified disturbances of skin sensation: Secondary | ICD-10-CM | POA: Insufficient documentation

## 2012-08-28 DIAGNOSIS — J4489 Other specified chronic obstructive pulmonary disease: Secondary | ICD-10-CM | POA: Insufficient documentation

## 2012-08-28 DIAGNOSIS — Z902 Acquired absence of lung [part of]: Secondary | ICD-10-CM | POA: Insufficient documentation

## 2012-08-28 DIAGNOSIS — N184 Chronic kidney disease, stage 4 (severe): Secondary | ICD-10-CM

## 2012-08-28 DIAGNOSIS — Z85528 Personal history of other malignant neoplasm of kidney: Secondary | ICD-10-CM | POA: Insufficient documentation

## 2012-08-28 DIAGNOSIS — N186 End stage renal disease: Secondary | ICD-10-CM | POA: Insufficient documentation

## 2012-08-28 DIAGNOSIS — I120 Hypertensive chronic kidney disease with stage 5 chronic kidney disease or end stage renal disease: Secondary | ICD-10-CM | POA: Insufficient documentation

## 2012-08-28 DIAGNOSIS — N185 Chronic kidney disease, stage 5: Secondary | ICD-10-CM

## 2012-08-28 DIAGNOSIS — Z85118 Personal history of other malignant neoplasm of bronchus and lung: Secondary | ICD-10-CM | POA: Insufficient documentation

## 2012-08-28 DIAGNOSIS — Z905 Acquired absence of kidney: Secondary | ICD-10-CM | POA: Insufficient documentation

## 2012-08-28 DIAGNOSIS — Z79899 Other long term (current) drug therapy: Secondary | ICD-10-CM | POA: Insufficient documentation

## 2012-08-28 DIAGNOSIS — G6181 Chronic inflammatory demyelinating polyneuritis: Secondary | ICD-10-CM | POA: Insufficient documentation

## 2012-08-28 DIAGNOSIS — T82898A Other specified complication of vascular prosthetic devices, implants and grafts, initial encounter: Secondary | ICD-10-CM | POA: Insufficient documentation

## 2012-08-28 DIAGNOSIS — K219 Gastro-esophageal reflux disease without esophagitis: Secondary | ICD-10-CM | POA: Insufficient documentation

## 2012-08-28 DIAGNOSIS — Z87891 Personal history of nicotine dependence: Secondary | ICD-10-CM | POA: Insufficient documentation

## 2012-08-28 DIAGNOSIS — N400 Enlarged prostate without lower urinary tract symptoms: Secondary | ICD-10-CM | POA: Insufficient documentation

## 2012-08-28 HISTORY — PX: BASCILIC VEIN TRANSPOSITION: SHX5742

## 2012-08-28 LAB — POCT I-STAT 4, (NA,K, GLUC, HGB,HCT)
HCT: 28 % — ABNORMAL LOW (ref 39.0–52.0)
Hemoglobin: 9.5 g/dL — ABNORMAL LOW (ref 13.0–17.0)
Potassium: 4.9 mEq/L (ref 3.5–5.1)
Sodium: 139 mEq/L (ref 135–145)

## 2012-08-28 SURGERY — TRANSPOSITION, VEIN, BASILIC
Anesthesia: General | Site: Arm Upper | Laterality: Left | Wound class: Clean

## 2012-08-28 MED ORDER — OXYCODONE HCL 5 MG/5ML PO SOLN: 5.0000 mg | Freq: Once | ORAL | Status: DC | PRN

## 2012-08-28 MED ORDER — GLYCOPYRROLATE 0.2 MG/ML IJ SOLN
INTRAMUSCULAR | Status: DC | PRN
  Administered 2012-08-28: 0.2 mg via INTRAVENOUS

## 2012-08-28 MED ORDER — 0.9 % SODIUM CHLORIDE (POUR BTL) OPTIME
TOPICAL | Status: DC | PRN
  Administered 2012-08-28: 1000 mL

## 2012-08-28 MED ORDER — THROMBIN 20000 UNITS EX SOLR
OROMUCOSAL | Status: DC | PRN
  Administered 2012-08-28: 09:00:00 via TOPICAL

## 2012-08-28 MED ORDER — SODIUM CHLORIDE 0.9 % IR SOLN
Status: DC | PRN
  Administered 2012-08-28: 07:00:00

## 2012-08-28 MED ORDER — ONDANSETRON HCL 4 MG/2ML IJ SOLN: 4.0000 mg | Freq: Once | INTRAMUSCULAR | Status: DC | PRN

## 2012-08-28 MED ORDER — SODIUM CHLORIDE 0.9 % IV SOLN
INTRAVENOUS | Status: DC | PRN
  Administered 2012-08-28 (×2): via INTRAVENOUS

## 2012-08-28 MED ORDER — SODIUM CHLORIDE 0.9 % IV SOLN: INTRAVENOUS | Status: DC

## 2012-08-28 MED ORDER — FENTANYL CITRATE 0.05 MG/ML IJ SOLN
INTRAMUSCULAR | Status: DC | PRN
  Administered 2012-08-28 (×2): 25 ug via INTRAVENOUS
  Administered 2012-08-28: 50 ug via INTRAVENOUS
  Administered 2012-08-28 (×4): 25 ug via INTRAVENOUS

## 2012-08-28 MED ORDER — THROMBIN 20000 UNITS EX SOLR
CUTANEOUS | Status: AC
  Filled 2012-08-28: qty 20000

## 2012-08-28 MED ORDER — LIDOCAINE HCL (CARDIAC) 20 MG/ML IV SOLN
INTRAVENOUS | Status: DC | PRN
  Administered 2012-08-28: 80 mg via INTRAVENOUS

## 2012-08-28 MED ORDER — PROPOFOL 10 MG/ML IV BOLUS
INTRAVENOUS | Status: DC | PRN
  Administered 2012-08-28: 130 mg via INTRAVENOUS

## 2012-08-28 MED ORDER — PHENYLEPHRINE HCL 10 MG/ML IJ SOLN
10.0000 mg | INTRAVENOUS | Status: DC | PRN
  Administered 2012-08-28: 09:00:00 via INTRAVENOUS
  Administered 2012-08-28: 10 ug/min via INTRAVENOUS

## 2012-08-28 MED ORDER — MIDAZOLAM HCL 5 MG/5ML IJ SOLN
INTRAMUSCULAR | Status: DC | PRN
  Administered 2012-08-28 (×2): 1 mg via INTRAVENOUS

## 2012-08-28 MED ORDER — ONDANSETRON HCL 4 MG/2ML IJ SOLN
INTRAMUSCULAR | Status: DC | PRN
  Administered 2012-08-28: 4 mg via INTRAVENOUS

## 2012-08-28 MED ORDER — OXYCODONE HCL 5 MG PO TABS: 5.0000 mg | ORAL_TABLET | ORAL | Status: DC | PRN

## 2012-08-28 MED ORDER — EPHEDRINE SULFATE 50 MG/ML IJ SOLN
INTRAMUSCULAR | Status: DC | PRN
  Administered 2012-08-28 (×5): 5 mg via INTRAVENOUS

## 2012-08-28 MED ORDER — HEPARIN SODIUM (PORCINE) 1000 UNIT/ML IJ SOLN
INTRAMUSCULAR | Status: DC | PRN
  Administered 2012-08-28: 3000 [IU] via INTRAVENOUS

## 2012-08-28 MED ORDER — OXYCODONE HCL 5 MG PO TABS: 5.0000 mg | ORAL_TABLET | Freq: Once | ORAL | Status: DC | PRN

## 2012-08-28 MED ORDER — HYDROMORPHONE HCL PF 1 MG/ML IJ SOLN: 0.2500 mg | INTRAMUSCULAR | Status: DC | PRN

## 2012-08-28 SURGICAL SUPPLY — 40 items
ADH SKN CLS APL DERMABOND .7 (GAUZE/BANDAGES/DRESSINGS) ×2
CANISTER SUCTION 2500CC (MISCELLANEOUS) ×2 IMPLANT
CLIP TI MEDIUM 24 (CLIP) ×2 IMPLANT
CLIP TI WIDE RED SMALL 24 (CLIP) ×2 IMPLANT
CLOTH BEACON ORANGE TIMEOUT ST (SAFETY) ×2 IMPLANT
CORDS BIPOLAR (ELECTRODE) IMPLANT
COVER PROBE W GEL 5X96 (DRAPES) ×1 IMPLANT
COVER SURGICAL LIGHT HANDLE (MISCELLANEOUS) ×2 IMPLANT
DECANTER SPIKE VIAL GLASS SM (MISCELLANEOUS) ×1 IMPLANT
DERMABOND ADVANCED (GAUZE/BANDAGES/DRESSINGS) ×2
DERMABOND ADVANCED .7 DNX12 (GAUZE/BANDAGES/DRESSINGS) ×1 IMPLANT
ELECT REM PT RETURN 9FT ADLT (ELECTROSURGICAL) ×2
ELECTRODE REM PT RTRN 9FT ADLT (ELECTROSURGICAL) ×1 IMPLANT
GLOVE BIO SURGEON STRL SZ7 (GLOVE) ×2 IMPLANT
GLOVE BIOGEL PI IND STRL 6.5 (GLOVE) IMPLANT
GLOVE BIOGEL PI IND STRL 7.0 (GLOVE) IMPLANT
GLOVE BIOGEL PI IND STRL 7.5 (GLOVE) ×1 IMPLANT
GLOVE BIOGEL PI INDICATOR 6.5 (GLOVE) ×2
GLOVE BIOGEL PI INDICATOR 7.0 (GLOVE) ×2
GLOVE BIOGEL PI INDICATOR 7.5 (GLOVE) ×1
GLOVE ECLIPSE 7.0 STRL STRAW (GLOVE) ×1 IMPLANT
GLOVE SURG SS PI 6.5 STRL IVOR (GLOVE) ×1 IMPLANT
GLOVE SURG SS PI 7.0 STRL IVOR (GLOVE) ×1 IMPLANT
GOWN STRL NON-REIN LRG LVL3 (GOWN DISPOSABLE) ×6 IMPLANT
KIT BASIN OR (CUSTOM PROCEDURE TRAY) ×2 IMPLANT
KIT ROOM TURNOVER OR (KITS) ×2 IMPLANT
NS IRRIG 1000ML POUR BTL (IV SOLUTION) ×2 IMPLANT
PACK CV ACCESS (CUSTOM PROCEDURE TRAY) ×2 IMPLANT
PAD ARMBOARD 7.5X6 YLW CONV (MISCELLANEOUS) ×4 IMPLANT
SPONGE SURGIFOAM ABS GEL 100 (HEMOSTASIS) IMPLANT
SUT MNCRL AB 4-0 PS2 18 (SUTURE) ×4 IMPLANT
SUT PROLENE 6 0 BV (SUTURE) ×1 IMPLANT
SUT PROLENE 7 0 BV 1 (SUTURE) ×2 IMPLANT
SUT SILK 2 0 SH (SUTURE) ×2 IMPLANT
SUT VIC AB 3-0 SH 27 (SUTURE) ×6
SUT VIC AB 3-0 SH 27X BRD (SUTURE) ×1 IMPLANT
TOWEL OR 17X24 6PK STRL BLUE (TOWEL DISPOSABLE) ×2 IMPLANT
TOWEL OR 17X26 10 PK STRL BLUE (TOWEL DISPOSABLE) ×2 IMPLANT
UNDERPAD 30X30 INCONTINENT (UNDERPADS AND DIAPERS) ×2 IMPLANT
WATER STERILE IRR 1000ML POUR (IV SOLUTION) ×2 IMPLANT

## 2012-08-28 NOTE — Anesthesia Preprocedure Evaluation (Signed)
Anesthesia Evaluation  Patient identified by MRN, date of birth, ID band Patient awake    Reviewed: Allergy & Precautions, H&P , NPO status , Patient's Chart, lab work & pertinent test results, reviewed documented beta blocker date and time   Airway Mallampati: I TM Distance: >3 FB Neck ROM: Full    Dental  (+) Dental Advisory Given and Poor Dentition   Pulmonary  breath sounds clear to auscultation        Cardiovascular hypertension, Pt. on medications and Pt. on home beta blockers Rhythm:Regular Rate:Normal     Neuro/Psych    GI/Hepatic   Endo/Other    Renal/GU ESRF and Renal InsufficiencyRenal disease     Musculoskeletal   Abdominal   Peds  Hematology   Anesthesia Other Findings   Reproductive/Obstetrics                           Anesthesia Physical Anesthesia Plan  ASA: III  Anesthesia Plan: General   Post-op Pain Management:    Induction: Intravenous  Airway Management Planned: LMA  Additional Equipment:   Intra-op Plan:   Post-operative Plan: Extubation in OR  Informed Consent: I have reviewed the patients History and Physical, chart, labs and discussed the procedure including the risks, benefits and alternatives for the proposed anesthesia with the patient or authorized representative who has indicated his/her understanding and acceptance.   Dental advisory given  Plan Discussed with: CRNA, Anesthesiologist and Surgeon  Anesthesia Plan Comments:         Anesthesia Quick Evaluation

## 2012-08-28 NOTE — Telephone Encounter (Addendum)
Message copied by Gena Fray on Mon Aug 28, 2012  4:27 PM ------      Message from: Denman George      Created: Mon Aug 28, 2012  1:48 PM                   ----- Message -----         From: Conrad Franklin, MD         Sent: 08/28/2012  10:16 AM           To: Patrici Ranks, Alfonso Patten, RN            [name]      [id number]      [birth year]            FPROCEDURE:      left second stage basilic vein transposition (brachiobasilic arteriovenous fistula) placement with revision of anastomosis            Follow-up: 4 weeks             ------   08/28/12: spoke with patient to notify of appt, dpm

## 2012-08-28 NOTE — Anesthesia Postprocedure Evaluation (Signed)
  Anesthesia Post-op Note  Patient: [name]  Procedure(s) Performed: Procedure(s) with comments: [place] (Left) - left 2nd stage basilic vein transposition  Patient Location: PACU  Anesthesia Type:General  Level of Consciousness: awake, alert  and oriented  Airway and Oxygen Therapy: Patient Spontanous Breathing and Patient connected to nasal cannula oxygen  Post-op Pain: mild  Post-op Assessment: Post-op Vital signs reviewed  Post-op Vital Signs: Reviewed  Complications: No apparent anesthesia complications

## 2012-08-28 NOTE — Op Note (Signed)
OPERATIVE NOTE   PROCEDURE: left second stage basilic vein transposition (brachiobasilic arteriovenous fistula) placement with revision of anastomosis  PRE-OPERATIVE DIAGNOSIS: successful left first stage basilic vein transposition , chronic kidney disease stage V   POST-OPERATIVE DIAGNOSIS: same as above   SURGEON: Adele Barthel, MD  ANESTHESIA: general  ESTIMATED BLOOD LOSS: 50 cc  FINDING(S): 1.  Prior to new anastomosis: <50% radial signal augment with fistula compression 2.  Palpable thrill and faintly palpable radial pulse  SPECIMEN(S):  none  INDICATIONS:   [name] is a 79 y.o. male who presents with chronic kidney disease stage V.  I previously placed a left first stage basilic vein transposition.   The patient has had some steal symptoms, so we discussed revising the anastomosis during the second stage procedure.   The patient is scheduled for left second stage basilic vein transposition.  The patient is aware the risks include but are not limited to: bleeding, infection, steal syndrome, nerve damage, ischemic monomelic neuropathy, failure to mature, and need for additional procedures.  The patient is aware of the risks of the procedure and elects to proceed forward.  DESCRIPTION: After full informed written consent was obtained from the patient, the patient was brought back to the operating room and placed supine upon the operating table.  Prior to induction, the patient received IV antibiotics.   After obtaining adequate anesthesia, the patient was then prepped and draped in the standard fashion for a left arm access procedure.  I turned my attention first to identifying the patient's brachiobasilic arteriovenous fistula.  Using SonoSite guidance, the location of this fistula was marked out on the skin.  I made an longitudinal incision over the fistula from its arterial anastomosis up to its axillary extent.  I carefully dissected the fistula away from its adjacent nerves.   Eventually the entirety of this fistula was mobilized.  The median antecubital sensory nerve was wrapped around much of this fistula.  The fistula was > 6 mm throughout.  I dissected out the brachial artery just proximal to the previous anastomosis.  I had the fistula dissected out just adjacent to the anastomosis.  I clamped the fistula and checked the radial signal: <50% augmentation was noted.  There was a biphasic signal in the radial signal even without compression of the fistula, suggesting his steal symptoms are not related to the fistula.   I gave the patient 3000 units of Heparin intravenously to obtain a limited degree of anticoagulation.  I marked the orientation of the fistula with a marker and ligated the distal fistula adjacent to the anastomosis, leaving a vein patch on the brachial artery.  I transected the distal fistula to free up the fistula.  I dissected a subcutaneous tunnel over his biceps with a curve dissector.  The fistula was tied to the central cannula.  I delivered the fistula through the tunnel, maintaining orientation.  I released the fistula from the central cannula and removed the metal tunnel.  I placed the brachial artery under tension proximally and distally.  I made an arteriotomy and extended it proximally and distally, taking care to keep the arteriotomy only 3-3.5 mm.  I sewed the basilic vein to the brachial artery with a running stitch of 6-0 prolene in an end to side configuration, taking care to take out some the excess diameter.  Prior to completing the anastomosis, I backbled both ends of the artery.  There was no backbleeding from the vein due competent valves.  I  completed the anastomosis in the usual fashion.  The vessels loops were released and immediately there was a thrill.  There was a dopperable radial signal without augmentation immediately.  Later in the case, there was a weakly palpable radial pulse.    The deep subcutaneous tissue was inspected for  bleeding.  Bleeding was controlled with electrocautery and placement of large pieces of thrombin and gelfoam.  I washed out the surgical site after waiting a few minutes, and there was no further bleeding.  The deep subcutaneous tissue was reapproximated with mattress sutures of 3-0 Vicryl to eliminate some of the dead space.  The superficial subcutaneous tissue was then reapproximated along the incision line with a running stitch of 3-0 Vicryl.  The skin was then reapproximated with a running subcuticular of 4-0 Monocryl.  The skin was then cleaned, dried, and reinforced with Dermabond.  The patient tolerated this procedure well.   COMPLICATIONS: none  CONDITION: stable  Adele Barthel, MD [facility] [place] Office: [phone] Pager: [phone]  08/28/2012, 10:05 AM

## 2012-08-28 NOTE — Anesthesia Procedure Notes (Signed)
Procedure Name: LMA Insertion Date/Time: 08/28/2012 7:40 AM Performed by: [place] Pink Pre-anesthesia Checklist: Patient identified, Timeout performed, Emergency Drugs available, Suction available and Patient being monitored Patient Re-evaluated:Patient Re-evaluated prior to inductionOxygen Delivery Method: Circle system utilized Preoxygenation: Pre-oxygenation with 100% oxygen Intubation Type: IV induction LMA: LMA inserted LMA Size: 5.0 Number of attempts: 1 Placement Confirmation: positive ETCO2 and breath sounds checked- equal and bilateral Tube secured with: Tape Dental Injury: Teeth and Oropharynx as per pre-operative assessment

## 2012-08-28 NOTE — H&P (Signed)
[facility] [place]  Brief History and Physical  History of Present Illness  [name] is a 79 y.o. male who presents with chief complaint: numbness in left hand.  The patient presents today for L 2nd stage BVT.    Pt notes some progression in steal sx.  Patient is revision of anastomosis and transposition of fistula.  Past Medical History  Diagnosis Date  . BPH (benign prostatic hypertrophy)   . GERD (gastroesophageal reflux disease)   . Hypertension     stress test done - many years ago  . Arthritis     ankle , hip, knees , low back   . 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 /w prednisone x 17 yrs. has been d/c for 3 yrs.   [name] Kitchen COPD (chronic obstructive pulmonary disease)     told that he has "a bit of emphysema"  . Chronic kidney disease     ,not on hemo as of yet  . Cancer     lung  . Cancer 2001    renal  . Kidney calculus     Past Surgical History  Procedure Laterality Date  . Nephrectomy  2001    left  . Lung removal, partial      partial, right  . Ankle fusion      R ankle  . Cholecystectomy    . Joint replacement      L knee- 2010- [facility], L hip partial replacement   . Av fistula placement  06/01/2011    Procedure: ARTERIOVENOUS (AV) FISTULA CREATION;  Surgeon: Angelia Mould, MD;  [facility];  Service: Vascular;  Laterality: Left;  Creation of Left Arteriovenous fistula  . Fistulogram left radial cephalic av fistula  0000000      Dr. Scot Dock  . Hip arthroplasty Right     "partial"  . Av fistula placement Left 07/12/2012    Procedure: ARTERIOVENOUS (AV) FISTULA CREATION;  Surgeon: Conrad Halfway, MD;  Location: [facility];  Service: Vascular;  Laterality: Left;  . Av fistula placement Left 4.11.14    History   Social History  . Marital Status: Married    Spouse Name: N/A    Number of Children: N/A  . Years of Education: N/A   Occupational History  . Not on file.   Social  History Main Topics  . Smoking status: Former Smoker -- 35 years    Types: Cigarettes    Quit date: 02/04/1977  . Smokeless tobacco: Current User    Types: Chew  . Alcohol Use: No     Comment: beer- x2/ mth  . Drug Use: No  . Sexually Active: Not on file   Other Topics Concern  . Not on file   Social History Narrative  . No narrative on file    Family History  Problem Relation Age of Onset  . Anesthesia problems Neg Hx   . Hypotension Neg Hx   . Malignant hyperthermia Neg Hx   . Pseudochol deficiency Neg Hx   . Heart disease Mother   . Heart disease Father   . Heart attack Father     No current facility-administered medications on file prior to encounter.   Current Outpatient Prescriptions on File Prior to Encounter  Medication Sig Dispense Refill  . amLODipine (NORVASC) 5 MG tablet Take 5 mg by mouth daily.       . calcitRIOL (ROCALTROL) 0.5 MCG capsule Take 0.5 mcg by mouth every  Monday, Wednesday, and Friday.       . Cholecalciferol (VITAMIN D) 1000 UNITS capsule Take 1,000 Units by mouth daily.      [name] Kitchen doxazosin (CARDURA) 4 MG tablet Take 4 mg by mouth at bedtime.       . metoprolol succinate (TOPROL-XL) 25 MG 24 hr tablet Take 25 mg by mouth daily.      . sodium bicarbonate 650 MG tablet Take 1,300 mg by mouth 3 (three) times daily after meals.        No Known Allergies  Review of Systems: As listed above, otherwise negative.  Physical Examination  Filed Vitals:   08/28/12 0609  BP: 158/77  Pulse: 61  Temp: 98.2 F (36.8 C)  TempSrc: Oral  Resp: 18  SpO2: 98%    General: A&O x 3, WDWN  Pulmonary: Sym exp, good air movt, CTAB, no rales, rhonchi, & wheezing  Cardiac: RRR, Nl S1, S2, no Murmurs, rubs or gallops  Gastrointestinal: soft, NTND, -G/R, - HSM, - masses, - CVAT B  Musculoskeletal: M/S 5/5 throughout , Extremities without ischemic changes , hand grip intact L hand, grossly sensation intact  Laboratory See iStat  Medical Decision  Making  [name] is a 79 y.o. male who presents with: chronic kidney disease stage V .   The patient is scheduled for: L 2nd stage BVT, revision of anastomosis  Risk, benefits, and alternatives to access surgery were discussed.  The patient is aware the risks include but are not limited to: bleeding, infection, steal syndrome, nerve damage, ischemic monomelic neuropathy, failure to mature, and need for additional procedures.  The patient is aware that a long incision is utilized as part of the transposition process.    The patient is aware of the risks and agrees to proceed.  Adele Barthel, MD [facility] [place] Office: [phone] Pager: [phone]  08/28/2012, 7:20 AM

## 2012-08-28 NOTE — Preoperative (Signed)
Beta Blockers   Reason not to administer Beta Blockers:Not Applicable 

## 2012-08-28 NOTE — Transfer of Care (Signed)
Immediate Anesthesia Transfer of Care Note  Patient: [name]  Procedure(s) Performed: Procedure(s) with comments: [place] (Left) - left 2nd stage basilic vein transposition  Patient Location: PACU  Anesthesia Type:General  Level of Consciousness: awake and alert   Airway & Oxygen Therapy: Patient Spontanous Breathing and Patient connected to nasal cannula oxygen  Post-op Assessment: Report given to PACU RN and Post -op Vital signs reviewed and stable  Post vital signs: Reviewed and stable  Complications: No apparent anesthesia complications

## 2012-08-29 ENCOUNTER — Emergency Department (HOSPITAL_COMMUNITY)
Admission: EM | Admit: 2012-08-29 | Discharge: 2012-08-29 | Disposition: A | Discharge status: Home / Self Care | Payer: Medicare Other | Attending: Emergency Medicine | Admitting: Emergency Medicine

## 2012-08-29 ENCOUNTER — Encounter (HOSPITAL_COMMUNITY): Payer: Self-pay | Admitting: Vascular Surgery

## 2012-08-29 DIAGNOSIS — Z79899 Other long term (current) drug therapy: Secondary | ICD-10-CM | POA: Insufficient documentation

## 2012-08-29 DIAGNOSIS — IMO0002 Reserved for concepts with insufficient information to code with codable children: Secondary | ICD-10-CM | POA: Insufficient documentation

## 2012-08-29 DIAGNOSIS — Z85528 Personal history of other malignant neoplasm of kidney: Secondary | ICD-10-CM | POA: Insufficient documentation

## 2012-08-29 DIAGNOSIS — Y838 Other surgical procedures as the cause of abnormal reaction of the patient, or of later complication, without mention of misadventure at the time of the procedure: Secondary | ICD-10-CM | POA: Insufficient documentation

## 2012-08-29 DIAGNOSIS — J4489 Other specified chronic obstructive pulmonary disease: Secondary | ICD-10-CM | POA: Insufficient documentation

## 2012-08-29 DIAGNOSIS — T888XXA Other specified complications of surgical and medical care, not elsewhere classified, initial encounter: Secondary | ICD-10-CM

## 2012-08-29 DIAGNOSIS — Z8719 Personal history of other diseases of the digestive system: Secondary | ICD-10-CM | POA: Insufficient documentation

## 2012-08-29 DIAGNOSIS — N189 Chronic kidney disease, unspecified: Secondary | ICD-10-CM | POA: Insufficient documentation

## 2012-08-29 DIAGNOSIS — Z8739 Personal history of other diseases of the musculoskeletal system and connective tissue: Secondary | ICD-10-CM | POA: Insufficient documentation

## 2012-08-29 DIAGNOSIS — Z87891 Personal history of nicotine dependence: Secondary | ICD-10-CM | POA: Insufficient documentation

## 2012-08-29 DIAGNOSIS — I129 Hypertensive chronic kidney disease with stage 1 through stage 4 chronic kidney disease, or unspecified chronic kidney disease: Secondary | ICD-10-CM | POA: Insufficient documentation

## 2012-08-29 DIAGNOSIS — N400 Enlarged prostate without lower urinary tract symptoms: Secondary | ICD-10-CM | POA: Insufficient documentation

## 2012-08-29 DIAGNOSIS — Z85118 Personal history of other malignant neoplasm of bronchus and lung: Secondary | ICD-10-CM | POA: Insufficient documentation

## 2012-08-29 DIAGNOSIS — J449 Chronic obstructive pulmonary disease, unspecified: Secondary | ICD-10-CM | POA: Insufficient documentation

## 2012-08-29 DIAGNOSIS — Z87442 Personal history of urinary calculi: Secondary | ICD-10-CM | POA: Insufficient documentation

## 2012-08-29 DIAGNOSIS — Z8669 Personal history of other diseases of the nervous system and sense organs: Secondary | ICD-10-CM | POA: Insufficient documentation

## 2012-08-29 LAB — CBC WITH DIFFERENTIAL/PLATELET
Basophils Absolute: 0 10*3/uL (ref 0.0–0.1)
HCT: 27.6 % — ABNORMAL LOW (ref 39.0–52.0)
Lymphocytes Relative: 6 % — ABNORMAL LOW (ref 12–46)
Lymphs Abs: 0.6 10*3/uL — ABNORMAL LOW (ref 0.7–4.0)
Monocytes Absolute: 0.6 10*3/uL (ref 0.1–1.0)
Neutro Abs: 7.9 10*3/uL — ABNORMAL HIGH (ref 1.7–7.7)
RBC: 3.05 MIL/uL — ABNORMAL LOW (ref 4.22–5.81)
RDW: 14 % (ref 11.5–15.5)
WBC: 9.3 10*3/uL (ref 4.0–10.5)

## 2012-08-29 LAB — BASIC METABOLIC PANEL
CO2: 20 mEq/L (ref 19–32)
Chloride: 104 mEq/L (ref 96–112)
Creatinine, Ser: 5.29 mg/dL — ABNORMAL HIGH (ref 0.50–1.35)
Glucose, Bld: 119 mg/dL — ABNORMAL HIGH (ref 70–99)
Sodium: 136 mEq/L (ref 135–145)

## 2012-08-29 NOTE — ED Provider Notes (Signed)
History  This chart was scribed for [name] Rice, MD by Roe Coombs, ED Scribe. The patient was seen in room APA14/APA14. Patient's care was started at 1619.   CSN: [id number]  Arrival date & time 08/29/12  1554   First MD Initiated Contact with Patient 08/29/12 1619      Chief Complaint  Patient presents with  . Post-op Problem    fistula incision bleeding     Patient is a 79 y.o. male presenting with extremity pain. The history is provided by the patient. No language interpreter was used.  Extremity Pain This is a new problem. The current episode started less than 1 hour ago. The problem occurs constantly. The problem has not changed since onset.Pertinent negatives include no chest pain, no abdominal pain, no headaches and no shortness of breath. He has tried nothing for the symptoms.    HPI Comments: [name] is a 79 y.o. male who presents to the Emergency Department for a post-op problem after left second stage basilic vein transposition (brachiobasilic arteriovenous fistula) placement with revision of anastomosis. The procedure was peformed here yesterday by Dr. Bridgett Larsson and patient says that there were no complications during.. Patient states that his incision opened earlier today and there was bleeding from the site. Patient's wife states that there was bleeding for several minutes prior to EMS arrival. Wife says that the blood was bright red and there was a moderate amount. EMS wrapped the area on site. Patient denies dizziness, lightheadedness, abdominal pain, nausea, vomiting, diarrhea, chest pain, shortness of breath, fever, chills or any other symptoms. Patient has a medical history of chronic kidney disease, GERD, renal cancer, lung cancer, COPD and hypertension.    Past Medical History  Diagnosis Date  . BPH (benign prostatic hypertrophy)   . GERD (gastroesophageal reflux disease)   . Hypertension     stress test done - many years ago  . Arthritis     ankle , hip,  knees , low back   . 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 /w prednisone x 17 yrs. has been d/c for 3 yrs.   [name] Kitchen COPD (chronic obstructive pulmonary disease)     told that he has "a bit of emphysema"  . Chronic kidney disease     ,not on hemo as of yet  . Cancer     lung  . Cancer 2001    renal  . Kidney calculus     Past Surgical History  Procedure Laterality Date  . Nephrectomy  2001    left  . Lung removal, partial      partial, right  . Ankle fusion      R ankle  . Cholecystectomy    . Joint replacement      L knee- 2010- [facility], L hip partial replacement   . Av fistula placement  06/01/2011    Procedure: ARTERIOVENOUS (AV) FISTULA CREATION;  Surgeon: Angelia Mould, MD;  Location: [facility] OR;  Service: Vascular;  Laterality: Left;  Creation of Left Arteriovenous fistula  . Fistulogram left radial cephalic av fistula  0000000      Dr. Scot Dock  . Hip arthroplasty Right     "partial"  . Av fistula placement Left 07/12/2012    Procedure: ARTERIOVENOUS (AV) FISTULA CREATION;  Surgeon: Conrad [place], MD;  Location: [place];  Service: Vascular;  Laterality: Left;  . Av fistula placement Left 4.11.14  . Bascilic vein transposition Left 08/28/2012    Procedure:  [place];  Surgeon: Conrad Angels, MD;  Location: [place];  Service: Vascular;  Laterality: Left;  left 2nd stage basilic vein transposition    Family History  Problem Relation Age of Onset  . Anesthesia problems Neg Hx   . Hypotension Neg Hx   . Malignant hyperthermia Neg Hx   . Pseudochol deficiency Neg Hx   . Heart disease Mother   . Heart disease Father   . Heart attack Father     History  Substance Use Topics  . Smoking status: Former Smoker -- 35 years    Types: Cigarettes    Quit date: 02/04/1977  . Smokeless tobacco: Current User    Types: Chew  . Alcohol Use: No     Comment: beer- x2/ mth      Review of Systems  Constitutional: Negative  for fever and chills.  Respiratory: Negative for cough and shortness of breath.   Cardiovascular: Negative for chest pain.  Gastrointestinal: Negative for nausea, vomiting, abdominal pain and diarrhea.  Neurological: Negative for light-headedness and headaches.  All other systems reviewed and are negative.    Allergies  Review of patient's allergies indicates no known allergies.  Home Medications   Current Outpatient Rx  Name  Route  Sig  Dispense  Refill  . alfuzosin (UROXATRAL) 10 MG 24 hr tablet   Oral   Take 10 mg by mouth at bedtime.         [name] Kitchen amLODipine (NORVASC) 5 MG tablet   Oral   Take 5 mg by mouth every morning.          . calcitRIOL (ROCALTROL) 0.5 MCG capsule   Oral   Take 0.5 mcg by mouth every Monday, Wednesday, and Friday.          . Cholecalciferol (VITAMIN D) 1000 UNITS capsule   Oral   Take 1,000 Units by mouth every morning.          [name] Kitchen doxazosin (CARDURA) 4 MG tablet   Oral   Take 4 mg by mouth at bedtime.          . metoprolol succinate (TOPROL-XL) 25 MG 24 hr tablet   Oral   Take 25 mg by mouth every morning.          [name] Kitchen oxyCODONE (ROXICODONE) 5 MG immediate release tablet   Oral   Take 1 tablet (5 mg total) by mouth every 4 (four) hours as needed for pain.   30 tablet   0   . oxymetazoline (AFRIN) 0.05 % nasal spray   Nasal   Place 2 sprays into the nose daily as needed for congestion.         . sodium bicarbonate 650 MG tablet   Oral   Take 1,300 mg by mouth 3 (three) times daily after meals.           Triage Vitals: BP 145/66  Pulse 77  Temp(Src) 98.1 F (36.7 C) (Oral)  Resp 20  Ht 5\' 7"  (1.702 m)  Wt 170 lb (77.111 kg)  BMI 26.62 kg/m2  SpO2 98%  Physical Exam  Constitutional: He is oriented to person, place, and time. He appears well-developed and well-nourished.  HENT:  Head: Normocephalic and atraumatic.  Eyes: Conjunctivae and EOM are normal. Pupils are equal, round, and reactive to light.  Neck:  Normal range of motion. Neck supple.  Cardiovascular: Normal rate, regular rhythm and intact distal pulses.   Pulmonary/Chest: Effort normal and breath sounds normal.  Abdominal:  Soft. There is no tenderness. There is no rebound and no guarding.  Musculoskeletal:  Incision on the medial aspect of the left upper arm. No active bleeding. Sutures appear intact. Palpable thrill along the course. Diffuse swelling of the entire left upper extremity. Palpable distal radial pulses. Sensation fully intact. No evidence of infection.  Neurological: He is alert and oriented to person, place, and time.  Skin: Skin is warm and dry.    ED Course  Procedures (including critical care time) DIAGNOSTIC STUDIES: Oxygen Saturation is 98% on room air, normal, by my interpretation.    COORDINATION OF CARE: 4:38 PM- Patient presents with left upper extremity pain and bleeding from incision site after procedure yesterday. Sutures are intact and there is no active bleeding or evidence of infection. Will order CBC with diff and BMP. Patient and family informed of current plan for treatment and evaluation and agrees with plan at this time.   5:01 PM- Spoke with Dr. Oneida Alar regarding patient's case. Patient advised to go to Short Stay at [facility] tomorrow for followup. Also advised NPO tonight in the event that Dr. Bridgett Larsson has to do any additional procedures. Warned to return if there is any other bleeding prior to appointment tomorrow.   Labs Reviewed  CBC WITH DIFFERENTIAL - Abnormal; Notable for the following:    RBC 3.05 (*)    Hemoglobin 9.3 (*)    HCT 27.6 (*)    Neutrophils Relative 85 (*)    Neutro Abs 7.9 (*)    Lymphocytes Relative 6 (*)    Lymphs Abs 0.6 (*)    All other components within normal limits  BASIC METABOLIC PANEL - Abnormal; Notable for the following:    Glucose, Bld 119 (*)    BUN 60 (*)    Creatinine, Ser 5.29 (*)    Calcium 8.3 (*)    GFR calc non Af Amer 9 (*)    GFR calc Af Amer 11 (*)     All other components within normal limits    No results found.   1. Post-op bleeding, initial encounter       MDM  I personally performed the services described in this documentation, which was scribed in my presence. The recorded information has been reviewed and is accurate.    [name] Rice, MD 08/29/12 [phone]

## 2012-08-29 NOTE — ED Notes (Signed)
Per EMS pt is one day post-op from his fistula being moved off of a nerve in the left upper arm. EMS reports pts incision opened today and began bleeding. EMS wrapped sight. Minimal blood noted to bandage upon arrival. Distal pulses intact,CAP refill < 3 secs. pt able to feel touch in left upper extremity and full ROM distal to surgical site. V/s wnl. nad noted. Pt alert and oriented.

## 2012-08-29 NOTE — ED Provider Notes (Deleted)
History     CSN: [id number]  Arrival date & time 08/29/12  1554   First MD Initiated Contact with Patient 08/29/12 1619      Chief Complaint  Patient presents with  . Post-op Problem    fistula incision bleeding    (Consider location/radiation/quality/duration/timing/severity/associated sxs/prior treatment) HPI  Past Medical History  Diagnosis Date  . BPH (benign prostatic hypertrophy)   . GERD (gastroesophageal reflux disease)   . Hypertension     stress test done - many years ago  . Arthritis     ankle , hip, knees , low back   . 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 /w prednisone x 17 yrs. has been d/c for 3 yrs.   [name] Kitchen COPD (chronic obstructive pulmonary disease)     told that he has "a bit of emphysema"  . Chronic kidney disease     ,not on hemo as of yet  . Cancer     lung  . Cancer 2001    renal  . Kidney calculus     Past Surgical History  Procedure Laterality Date  . Nephrectomy  2001    left  . Lung removal, partial      partial, right  . Ankle fusion      R ankle  . Cholecystectomy    . Joint replacement      L knee- 2010- [facility], L hip partial replacement   . Av fistula placement  06/01/2011    Procedure: ARTERIOVENOUS (AV) FISTULA CREATION;  Surgeon: Angelia Mould, MD;  Location: [facility] OR;  Service: Vascular;  Laterality: Left;  Creation of Left Arteriovenous fistula  . Fistulogram left radial cephalic av fistula  0000000      Dr. Scot Dock  . Hip arthroplasty Right     "partial"  . Av fistula placement Left 07/12/2012    Procedure: ARTERIOVENOUS (AV) FISTULA CREATION;  Surgeon: Conrad [place], MD;  Location: [place];  Service: Vascular;  Laterality: Left;  . Av fistula placement Left 4.11.14  . Bascilic vein transposition Left 08/28/2012    Procedure: BASCILIC VEIN TRANSPOSITION;  Surgeon: Conrad [place], MD;  Location: [place];  Service: Vascular;  Laterality: Left;  left 2nd stage basilic vein transposition     Family History  Problem Relation Age of Onset  . Anesthesia problems Neg Hx   . Hypotension Neg Hx   . Malignant hyperthermia Neg Hx   . Pseudochol deficiency Neg Hx   . Heart disease Mother   . Heart disease Father   . Heart attack Father     History  Substance Use Topics  . Smoking status: Former Smoker -- 35 years    Types: Cigarettes    Quit date: 02/04/1977  . Smokeless tobacco: Current User    Types: Chew  . Alcohol Use: No     Comment: beer- x2/ mth      Review of Systems  Allergies  Review of patient's allergies indicates no known allergies.  Home Medications   Current Outpatient Rx  Name  Route  Sig  Dispense  Refill  . alfuzosin (UROXATRAL) 10 MG 24 hr tablet   Oral   Take 10 mg by mouth at bedtime.         [name] Kitchen amLODipine (NORVASC) 5 MG tablet   Oral   Take 5 mg by mouth every morning.          . calcitRIOL (ROCALTROL) 0.5 MCG capsule   Oral  Take 0.5 mcg by mouth every Monday, Wednesday, and Friday.          . Cholecalciferol (VITAMIN D) 1000 UNITS capsule   Oral   Take 1,000 Units by mouth every morning.          [name] Kitchen doxazosin (CARDURA) 4 MG tablet   Oral   Take 4 mg by mouth at bedtime.          . metoprolol succinate (TOPROL-XL) 25 MG 24 hr tablet   Oral   Take 25 mg by mouth every morning.          [name] Kitchen oxyCODONE (ROXICODONE) 5 MG immediate release tablet   Oral   Take 1 tablet (5 mg total) by mouth every 4 (four) hours as needed for pain.   30 tablet   0   . oxymetazoline (AFRIN) 0.05 % nasal spray   Nasal   Place 2 sprays into the nose daily as needed for congestion.         . sodium bicarbonate 650 MG tablet   Oral   Take 1,300 mg by mouth 3 (three) times daily after meals.           BP 140/69  Pulse 75  Temp(Src) 98.3 F (36.8 C) (Oral)  Resp 20  Ht 5\' 7"  (1.702 m)  Wt 170 lb (77.111 kg)  BMI 26.62 kg/m2  SpO2 96%  Physical Exam  ED Course  Procedures (including critical care time)  Labs  Reviewed  CBC WITH DIFFERENTIAL - Abnormal; Notable for the following:    RBC 3.05 (*)    Hemoglobin 9.3 (*)    HCT 27.6 (*)    Neutrophils Relative 85 (*)    Neutro Abs 7.9 (*)    Lymphocytes Relative 6 (*)    Lymphs Abs 0.6 (*)    All other components within normal limits  BASIC METABOLIC PANEL - Abnormal; Notable for the following:    Glucose, Bld 119 (*)    BUN 60 (*)    Creatinine, Ser 5.29 (*)    Calcium 8.3 (*)    GFR calc non Af Amer 9 (*)    GFR calc Af Amer 11 (*)    All other components within normal limits   No results found.   1. Post-op bleeding, initial encounter       MDM  I personally performed the services described in this documentation, which was scribed in my presence. The recorded information has been reviewed and is accurate.          [name], MD 08/29/12 [phone]

## 2012-08-30 ENCOUNTER — Telehealth: Payer: Self-pay | Admitting: Vascular Surgery

## 2012-08-30 NOTE — Telephone Encounter (Signed)
Patient had a bleeding event overnight.  Pt was seen at OSH.  By time pt was seen at the OSH, bleeding has stopped.  The patient was evaluated today.  On examination, mild venous oozing at skin edge.  Mild left hand swelling and mild upper arm swelling.  +bruit, +thrill, palpable radial pulses with hand grip 4-5/5, sensation intact.  Left arm incision intact and dry.  Incision cleaned with Chloraprep and redressed and ACE bandage applied.  Pt instructed to removed bandages in two days. He was instructed to call us if bleeding continues.

## 2012-09-01 ENCOUNTER — Encounter: Payer: Self-pay | Admitting: Vascular Surgery

## 2012-09-01 ENCOUNTER — Encounter (INDEPENDENT_AMBULATORY_CARE_PROVIDER_SITE_OTHER): Payer: Medicare Other | Admitting: *Deleted

## 2012-09-01 ENCOUNTER — Ambulatory Visit (INDEPENDENT_AMBULATORY_CARE_PROVIDER_SITE_OTHER): Payer: Medicare Other | Admitting: Vascular Surgery

## 2012-09-01 VITALS — BP 157/68 | HR 78 | Resp 18 | Ht 67.0 in | Wt 170.0 lb

## 2012-09-01 DIAGNOSIS — Z4931 Encounter for adequacy testing for hemodialysis: Secondary | ICD-10-CM | POA: Insufficient documentation

## 2012-09-01 DIAGNOSIS — T82898A Other specified complication of vascular prosthetic devices, implants and grafts, initial encounter: Secondary | ICD-10-CM

## 2012-09-01 NOTE — Progress Notes (Signed)
[facility] [place]  Postoperative Access Visit  History of Present Illness  [name] is a 79 y.o. year old male who presents for postoperative follow-up for: L 2nd BVT  (Date: 08/28/12).  Bleeding from L arm has decreased, only occasional ooze from incision.  Pt denies any steal sx.  Physical Examination  Filed Vitals:   09/01/12 1614  BP: 157/68  Pulse: 78  Resp: 18   LUE: Left arm is echymotic with some fullness without any tense hematoma in upper arm, palpable thrill in the arm, no TTP to light touch  Medical Decision Making  [name] is a 79 y.o. year old male who presents s/p L 2nd stage BVT complicated with likely hematoma.  I doubt any advantage to re-exploring this arm at this point.  Wound care instructions were given.  The patient will follow up for wound check this coming Thursday.  Adele Barthel, MD [facility] [place] Office: [phone] Pager: [phone]

## 2012-09-07 ENCOUNTER — Ambulatory Visit (INDEPENDENT_AMBULATORY_CARE_PROVIDER_SITE_OTHER): Payer: Medicare Other | Admitting: Vascular Surgery

## 2012-09-07 VITALS — BP 177/71 | HR 68 | Temp 98.3°F | Resp 18 | Ht 67.0 in | Wt 170.0 lb

## 2012-09-07 DIAGNOSIS — T82898A Other specified complication of vascular prosthetic devices, implants and grafts, initial encounter: Secondary | ICD-10-CM

## 2012-09-07 DIAGNOSIS — Z48812 Encounter for surgical aftercare following surgery on the circulatory system: Secondary | ICD-10-CM

## 2012-09-07 NOTE — Progress Notes (Signed)
[facility] [place]   Postoperative Access Visit   History of Present Illness  [name] is a 79 y.o. year old male who presents for postoperative follow-up for: L 2nd BVT (Date: 08/28/12). Bleeding from L arm has decreased, only occasional ooze from incision. Pt denies any steal sx. Swelling has improved in left arm.    Physical Examination  Filed Vitals:   09/07/12 1422  BP: 177/71  Pulse: 68  Temp: 98.3 F (36.8 C)  TempSrc: Oral  Resp: 18  Height: 5\' 7"  (1.702 m)  Weight: 170 lb (77.111 kg)  SpO2: 98%   LUE: Left arm has decreased echymosis, swelling is improved throughout, palpable thrill with bruit  Medical Decision Making  [name] is a 79 y.o. year old male who presents s/p L 2nd stage BVT complicated with likely hematoma.  Wound care instructions were reiterated.  Follow up in two weeks for wound check   Adele Barthel, MD  [facility] [place]  Office: [phone]  Pager: [phone]

## 2012-09-08 ENCOUNTER — Ambulatory Visit: Payer: Medicare Other

## 2012-09-09 ENCOUNTER — Telehealth: Payer: Self-pay | Admitting: Surgery

## 2012-09-11 ENCOUNTER — Encounter: Payer: Self-pay | Admitting: Surgery

## 2012-09-11 ENCOUNTER — Ambulatory Visit (INDEPENDENT_AMBULATORY_CARE_PROVIDER_SITE_OTHER): Payer: Medicare Other | Admitting: Surgery

## 2012-09-11 VITALS — BP 149/94 | HR 81 | Ht 67.0 in | Wt 168.2 lb

## 2012-09-11 DIAGNOSIS — N186 End stage renal disease: Secondary | ICD-10-CM

## 2012-09-11 DIAGNOSIS — T82898A Other specified complication of vascular prosthetic devices, implants and grafts, initial encounter: Secondary | ICD-10-CM

## 2012-09-11 DIAGNOSIS — Z48812 Encounter for surgical aftercare following surgery on the circulatory system: Secondary | ICD-10-CM

## 2012-09-11 NOTE — Progress Notes (Signed)
[facility] POST OPERATIVE OFFICE NOTE  CC:  F/u for surgery  HPI:  This is a 79 y.o. male who is s/p 2nd stage left brachiobasilic AVF placement with revision of anastomosis on 08/28/12 by Dr. Bridgett Larsson.  The pt had had some steal symptoms so he revised the anastomosis during the procedure.  He spoke with Dr. Trula Slade over the weekend for bleeding on Saturday.  Pressure was held and he has not had any significant bleeding since then.  No Known Allergies  Current Outpatient Prescriptions  Medication Sig Dispense Refill  . alfuzosin (UROXATRAL) 10 MG 24 hr tablet Take 10 mg by mouth at bedtime.      [name] Kitchen amLODipine (NORVASC) 5 MG tablet Take 5 mg by mouth every morning.       . calcitRIOL (ROCALTROL) 0.5 MCG capsule Take 0.5 mcg by mouth every Monday, Wednesday, and Friday.       . Cholecalciferol (VITAMIN D) 1000 UNITS capsule Take 1,000 Units by mouth every morning.       [name] Kitchen doxazosin (CARDURA) 4 MG tablet Take 4 mg by mouth at bedtime.       . metoprolol succinate (TOPROL-XL) 25 MG 24 hr tablet Take 25 mg by mouth every morning.       [name] Kitchen oxyCODONE (ROXICODONE) 5 MG immediate release tablet Take 1 tablet (5 mg total) by mouth every 4 (four) hours as needed for pain.  30 tablet  0  . oxymetazoline (AFRIN) 0.05 % nasal spray Place 2 sprays into the nose daily as needed for congestion.      . sodium bicarbonate 650 MG tablet Take 1,300 mg by mouth 3 (three) times daily after meals.       No current facility-administered medications for this visit.     ROS:  See HPI  Physical Exam:  Filed Vitals:   09/11/12 1508  BP: 149/94  Pulse: 81    Incision:  C/d/i-there is minimal bloody drainage from incision Extremities:  LUE does have edema throughout the entire arm.  There is a good thrill in the fistula.   A/P:  This is a 79 y.o. male here for concerns about bleeding from his left BVT that has happened on 2 occasions.  -there is no active bleeding at this time. -His left arm is  wrapped with ACE wrap from hand to shoulder today.  He and his family are instructed where to hold pressure should he experience more bleeding in the future. -will have pt come back on Friday to see Dr. Bridgett Larsson and also have an ultrasound to evaluate BVT.   Leontine Locket, PA-C [facility] [phone]  Clinic MD:  Pt seen and examined in conjunction with Dr. Trula Slade.  I agree with the above

## 2012-09-14 ENCOUNTER — Encounter: Payer: Self-pay | Admitting: Vascular Surgery

## 2012-09-14 DIAGNOSIS — N184 Chronic kidney disease, stage 4 (severe): Secondary | ICD-10-CM | POA: Diagnosis not present

## 2012-09-14 DIAGNOSIS — Z79899 Other long term (current) drug therapy: Secondary | ICD-10-CM | POA: Diagnosis not present

## 2012-09-14 DIAGNOSIS — D649 Anemia, unspecified: Secondary | ICD-10-CM | POA: Diagnosis not present

## 2012-09-14 DIAGNOSIS — R809 Proteinuria, unspecified: Secondary | ICD-10-CM | POA: Diagnosis not present

## 2012-09-14 DIAGNOSIS — E559 Vitamin D deficiency, unspecified: Secondary | ICD-10-CM | POA: Diagnosis not present

## 2012-09-15 ENCOUNTER — Ambulatory Visit: Payer: Medicare Other | Admitting: Vascular Surgery

## 2012-09-15 ENCOUNTER — Encounter: Payer: Self-pay | Admitting: Vascular Surgery

## 2012-09-15 ENCOUNTER — Encounter (INDEPENDENT_AMBULATORY_CARE_PROVIDER_SITE_OTHER): Payer: Medicare Other | Admitting: *Deleted

## 2012-09-15 ENCOUNTER — Ambulatory Visit (INDEPENDENT_AMBULATORY_CARE_PROVIDER_SITE_OTHER): Payer: Medicare Other | Admitting: Vascular Surgery

## 2012-09-15 VITALS — BP 150/86 | HR 77 | Resp 16 | Ht 67.0 in | Wt 167.0 lb

## 2012-09-15 DIAGNOSIS — N186 End stage renal disease: Secondary | ICD-10-CM

## 2012-09-15 DIAGNOSIS — M7989 Other specified soft tissue disorders: Secondary | ICD-10-CM | POA: Insufficient documentation

## 2012-09-15 DIAGNOSIS — T82898A Other specified complication of vascular prosthetic devices, implants and grafts, initial encounter: Secondary | ICD-10-CM

## 2012-09-15 DIAGNOSIS — Z48812 Encounter for surgical aftercare following surgery on the circulatory system: Secondary | ICD-10-CM

## 2012-09-15 DIAGNOSIS — T82598A Other mechanical complication of other cardiac and vascular devices and implants, initial encounter: Secondary | ICD-10-CM

## 2012-09-15 NOTE — Progress Notes (Signed)
[facility] [place]   Postoperative Access Visit   History of Present Illness  [name] is a 79 y.o. year old male who presents for postoperative follow-up for: L 2nd BVT (Date: 08/28/12). Pt is had another event in which the L arm drainaged significantly serosanguinous fluid.  Pt denies any steal sx. Swelling has improved in left arm.   Physical Examination  Filed Vitals:   09/15/12 0936  BP: 150/86  Pulse: 77  Resp: 16  Height: 5\' 7"  (1.702 m)  Weight: 167 lb (75.751 kg)  SpO2: 100%   LUE: Left arm has decreased echymosis, swelling is improved throughout, palpable thrill with bruit , ballotable fluid in the periantecubital region  Medical Decision Making  [name] is a 79 y.o. year old male who presents s/p L 2nd stage BVT complicated with likely hematoma.  The "bleeding" is likely decompression of Liquifed hematoma. We discussed continued wound care vs. return to OR for drainage of the hematoma. At this point, the patient elects to continue with wound care but agreed to return to OR if another "bleeding" episode developed. Follow up in 4 weeks for wound check  Adele Barthel, MD  [facility] [place]  Office: [phone]  Pager: [phone]

## 2012-09-22 ENCOUNTER — Ambulatory Visit: Payer: Medicare Other | Admitting: Vascular Surgery

## 2012-09-28 DIAGNOSIS — Z79899 Other long term (current) drug therapy: Secondary | ICD-10-CM | POA: Diagnosis not present

## 2012-09-28 DIAGNOSIS — D649 Anemia, unspecified: Secondary | ICD-10-CM | POA: Diagnosis not present

## 2012-09-28 DIAGNOSIS — N185 Chronic kidney disease, stage 5: Secondary | ICD-10-CM | POA: Diagnosis not present

## 2012-10-06 ENCOUNTER — Ambulatory Visit: Payer: Medicare Other | Admitting: Vascular Surgery

## 2012-10-09 ENCOUNTER — Encounter: Payer: Self-pay | Admitting: Surgery

## 2012-10-09 ENCOUNTER — Ambulatory Visit: Payer: Medicare Other | Admitting: Surgery

## 2012-10-09 ENCOUNTER — Ambulatory Visit (INDEPENDENT_AMBULATORY_CARE_PROVIDER_SITE_OTHER): Payer: Medicare Other | Admitting: Surgery

## 2012-10-09 VITALS — BP 162/71 | HR 68 | Temp 97.5°F | Resp 16 | Ht 67.0 in | Wt 168.0 lb

## 2012-10-09 DIAGNOSIS — N186 End stage renal disease: Secondary | ICD-10-CM

## 2012-10-09 DIAGNOSIS — Z5189 Encounter for other specified aftercare: Secondary | ICD-10-CM

## 2012-10-09 DIAGNOSIS — Z48812 Encounter for surgical aftercare following surgery on the circulatory system: Secondary | ICD-10-CM

## 2012-10-09 NOTE — Progress Notes (Signed)
I was asked to evaluate the patient's wound. There was concern of her erythema. All his incisions are intact. There is residual hematoma without evidence of infection. There is a good thrill within the fistula. I have recommended that he come back to see Dr. Bridgett Larsson in 2 weeks

## 2012-10-19 ENCOUNTER — Encounter: Payer: Self-pay | Admitting: Vascular Surgery

## 2012-10-19 DIAGNOSIS — D649 Anemia, unspecified: Secondary | ICD-10-CM | POA: Diagnosis not present

## 2012-10-19 DIAGNOSIS — Z79899 Other long term (current) drug therapy: Secondary | ICD-10-CM | POA: Diagnosis not present

## 2012-10-19 DIAGNOSIS — R809 Proteinuria, unspecified: Secondary | ICD-10-CM | POA: Diagnosis not present

## 2012-10-19 DIAGNOSIS — E559 Vitamin D deficiency, unspecified: Secondary | ICD-10-CM | POA: Diagnosis not present

## 2012-10-19 DIAGNOSIS — N185 Chronic kidney disease, stage 5: Secondary | ICD-10-CM | POA: Diagnosis not present

## 2012-10-20 ENCOUNTER — Encounter: Payer: Self-pay | Admitting: Vascular Surgery

## 2012-10-20 ENCOUNTER — Ambulatory Visit (INDEPENDENT_AMBULATORY_CARE_PROVIDER_SITE_OTHER): Payer: Medicare Other | Admitting: Vascular Surgery

## 2012-10-20 VITALS — BP 161/69 | HR 93 | Resp 18 | Ht 67.0 in | Wt 170.0 lb

## 2012-10-20 DIAGNOSIS — N186 End stage renal disease: Secondary | ICD-10-CM

## 2012-10-20 NOTE — Progress Notes (Signed)
[facility] [place]  Postoperative Access Visit  History of Present Illness  [name] is a 79 y.o. year old male who presents for postoperative follow-up for: L 2nd BVT (Date: 08/28/12).  The patient's wounds are healed.  The patient notes no steal symptoms.  The patient is able to complete their activities of daily living.  The patient's current symptoms are: none.  No further bleeding since last appointment  For VQI Use Only  PRE-ADM LIVING: Home  AMB STATUS: Ambulatory  Physical Examination Filed Vitals:   10/20/12 0908  BP: 161/69  Pulse: 93  Resp: 18    LUE: Incision is healed, skin feels warm, hand grip is 5/5, sensation in digits is intact, easily palpable thrill, bruit can be auscultated, vein >6 mm throughout, pulsatile segment distal to new arterial anastomosis on sonosite appears to be residual distal basilic vein attached to previous arterial anastomosis  Medical Decision Making  [name] is a 79 y.o. year old male who presents s/p L 2nd BVT.  The patient's access is ready for use.  It is no clear to me if the patient developed a pseudoaneurysm possibly due to infection at the prior anastomotic site or this is residual vein on the artery.  Regardless, I don't think immediately anything needs to be done.  I will have him follow in 3 months to monitor this pulsatile segment  Thank you for allowing [place] to participate in this patient's care.  Adele Barthel, MD [facility] [place] Office: [phone] Pager: [phone]  10/20/2012, 9:47 AM

## 2012-10-25 ENCOUNTER — Ambulatory Visit (HOSPITAL_COMMUNITY)
Admission: RE | Admit: 2012-10-25 | Discharge: 2012-10-25 | Disposition: A | Discharge status: Home / Self Care | Payer: Medicare Other | Source: Ambulatory Visit | Attending: Oncology | Admitting: Oncology

## 2012-10-25 DIAGNOSIS — Z09 Encounter for follow-up examination after completed treatment for conditions other than malignant neoplasm: Secondary | ICD-10-CM | POA: Insufficient documentation

## 2012-10-25 DIAGNOSIS — R918 Other nonspecific abnormal finding of lung field: Secondary | ICD-10-CM | POA: Insufficient documentation

## 2012-10-25 DIAGNOSIS — C3490 Malignant neoplasm of unspecified part of unspecified bronchus or lung: Secondary | ICD-10-CM | POA: Insufficient documentation

## 2012-10-27 ENCOUNTER — Encounter (HOSPITAL_COMMUNITY): Payer: Medicare Other | Attending: Physician Assistant

## 2012-10-27 ENCOUNTER — Encounter (HOSPITAL_COMMUNITY): Payer: Self-pay

## 2012-10-27 VITALS — BP 147/62 | HR 70 | Temp 97.4°F | Resp 16 | Wt 168.0 lb

## 2012-10-27 DIAGNOSIS — C3411 Malignant neoplasm of upper lobe, right bronchus or lung: Secondary | ICD-10-CM | POA: Insufficient documentation

## 2012-10-27 DIAGNOSIS — C3492 Malignant neoplasm of unspecified part of left bronchus or lung: Secondary | ICD-10-CM

## 2012-10-27 DIAGNOSIS — C3490 Malignant neoplasm of unspecified part of unspecified bronchus or lung: Secondary | ICD-10-CM

## 2012-10-27 NOTE — Progress Notes (Signed)
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   Problem #2 COPD secondary to long-standing smoking history. With baseline shortness of breath  Problem #3 renal cell carcinoma (T2 N0) status post right nephrectomy on 08/31/1999 by Dr. Maryland Pink thus far without recurrent disease  Problem #4 renal failure with nephrotic syndrome seeing Dr. Lowanda Foster. Patient to followup with Dr. Lowanda Foster next regarding starting dialysis. He is status post fistula placement in the left arm in anticipation of starting dialysis.  Problem #5 minimal MGUS with no evidence of M spike at this time nor does he have Bence-Jones proteinuria.  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  Problem #7 right hand tendon issues with inability to extend his fourth and fifth fingers. He has been told this is due to tendon rupture by his hand surgeon Dr. Fredna Dow  Mr. Suire presents for yearly followup. The results of his restaging/followup CT scan of the chest were reviewed with him and that the results revealed stable disease with no evidence of new or recurrent disease. Laboratory data from April when he was going through his fistula placement was also reviewed with the patient. He had some issues with nonfunctioning fistula and this had to be replaced and then he had issues with bleeding with the second of fistula. These issues have resolved. Dr. Bridgett Larsson is the surgeon who placed the second fistula. As stated above he is scheduled for followup visit with Dr. Lowanda Foster to discuss initiating dialysis treatment. The patient is apprehensive about initiating dialysis treatment at this time. He states that he feels well and is able to urinate well. He will discuss his concerns  with Dr. Lowanda Foster.   BP 147/62  Pulse 70  Temp(Src) 97.4 F (36.3 C)  Resp 16  Wt 168 lb (76.204 kg)  BMI 26.31 kg/m2  HEENT: Normocephalic atraumatic, no alopecia present, oral pharynx clear no evidence of thrush or mucositis Chest clear to auscultation, with slight decreased breath sound without wheezes rales or rhonchi Cardiovascular exam reveals regular rate and rhythm with a normal S1-S2 no appreciable murmurs gallops or rubs Abdomen-soft nontender nondistended, no appreciable organomegaly, bowel sounds present normally active in upper quadrants Lymphatics-no palpable adenopathy in the cervical, supraclavicular, infraclavicular , axillary or inguinal regions Extremities: Trace pitting edema, no point tenderness or palpable cords. Grip strength is 4 over 5 bilaterally Neuro exam: Nonfocal   Impression and plan 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 His CT shows no evidence of new or recurrent disease. He is to followup in one year with a repeat CT scan of the chest without contrast. Additionally he will be due for followup of his MGUS and we will check a CBC differential, C. met, LDH, quantitative immunoglobulin, beta 2 microglobulin, and serum light chains. We will order these studies to be done 1 week prior to the provider appointment such that all results are available for the provider to review with the patient on his return in one year.   The patient is encouraged to followup with his nephrologist, Dr. Lowanda Foster regarding his need for and concerns regarding dialysis and with his surgeon Dr. Bridgett Larsson regarding any issues with his fistula.  Wynetta Emery,  [facility] Hematology/Oncology

## 2012-10-27 NOTE — Patient Instructions (Addendum)
[place] Discharge Instructions  RECOMMENDATIONS MADE BY THE CONSULTANT AND ANY TEST RESULTS WILL BE SENT TO YOUR REFERRING PHYSICIAN.  EXAM FINDINGS BY THE PHYSICIAN TODAY AND SIGNS OR SYMPTOMS TO REPORT TO CLINIC OR PRIMARY PHYSICIAN: Exam and discussion by Awilda Metro, PA-C. Your scan was stable.  MEDICATIONS PRESCRIBED:  none  INSTRUCTIONS GIVEN AND DISCUSSED: Report increased shortness of breath, blood in your sputum, unusual cough, or other problems.  SPECIAL INSTRUCTIONS/FOLLOW-UP: To be seen in 1 year with blood work, CT scan of your chest and exam.  Thank you for choosing [facility] to provide your oncology and hematology care.  To afford each patient quality time with our providers, please arrive at least 15 minutes before your scheduled appointment time.  With your help, our goal is to use those 15 minutes to complete the necessary work-up to ensure our physicians have the information they need to help with your evaluation and healthcare recommendations.    Effective January 1st, 2014, we ask that you re-schedule your appointment with our physicians should you arrive 10 or more minutes late for your appointment.  We strive to give you quality time with our providers, and arriving late affects you and other patients whose appointments are after yours.    Again, thank you for choosing [facility].  Our hope is that these requests will decrease the amount of time that you wait before being seen by our physicians.       _____________________________________________________________  Should you have questions after your visit to [facility], please contact our office at [phone] between the hours of 8:30 a.m. and 5:00 p.m.  Voicemails left after 4:30 p.m. will not be returned until the following business day.  For prescription refill requests, have your pharmacy contact our office with your prescription refill  request.

## 2012-11-30 DIAGNOSIS — N185 Chronic kidney disease, stage 5: Secondary | ICD-10-CM | POA: Diagnosis not present

## 2012-11-30 DIAGNOSIS — R809 Proteinuria, unspecified: Secondary | ICD-10-CM | POA: Diagnosis not present

## 2012-11-30 DIAGNOSIS — D649 Anemia, unspecified: Secondary | ICD-10-CM | POA: Diagnosis not present

## 2012-11-30 DIAGNOSIS — I10 Essential (primary) hypertension: Secondary | ICD-10-CM | POA: Diagnosis not present

## 2012-11-30 DIAGNOSIS — Z79899 Other long term (current) drug therapy: Secondary | ICD-10-CM | POA: Diagnosis not present

## 2013-01-01 DIAGNOSIS — N189 Chronic kidney disease, unspecified: Secondary | ICD-10-CM | POA: Diagnosis not present

## 2013-01-01 DIAGNOSIS — Z79899 Other long term (current) drug therapy: Secondary | ICD-10-CM | POA: Diagnosis not present

## 2013-01-01 DIAGNOSIS — D649 Anemia, unspecified: Secondary | ICD-10-CM | POA: Diagnosis not present

## 2013-01-01 DIAGNOSIS — I10 Essential (primary) hypertension: Secondary | ICD-10-CM | POA: Diagnosis not present

## 2013-01-01 DIAGNOSIS — R809 Proteinuria, unspecified: Secondary | ICD-10-CM | POA: Diagnosis not present

## 2013-01-01 DIAGNOSIS — E559 Vitamin D deficiency, unspecified: Secondary | ICD-10-CM | POA: Diagnosis not present

## 2013-01-18 ENCOUNTER — Encounter: Payer: Self-pay | Admitting: Vascular Surgery

## 2013-01-19 ENCOUNTER — Encounter: Payer: Self-pay | Admitting: Vascular Surgery

## 2013-01-19 ENCOUNTER — Ambulatory Visit (INDEPENDENT_AMBULATORY_CARE_PROVIDER_SITE_OTHER): Payer: Medicare Other | Admitting: Vascular Surgery

## 2013-01-19 VITALS — BP 177/68 | HR 62 | Ht 67.0 in | Wt 168.0 lb

## 2013-01-19 DIAGNOSIS — N184 Chronic kidney disease, stage 4 (severe): Secondary | ICD-10-CM

## 2013-01-19 NOTE — Progress Notes (Signed)
[facility] [place]  Established Dialysis Access  History of Present Illness  [name] is a 79 y.o. (1930/05/29) male who presents for re-evaluation of L BVT.  The patient still has not start HD though he has been told he needs to.  He denies any complications since that last procedure, L 2nd BVT (Date: 08/28/12).  The pulsatile mass has not enlarged.    He has had no steal and can perform his ADL without any difficulty.  The patient's PMH, PSH, SH, FamHx, Med, and Allergies are unchanged from 10/20/12.  On ROS today: no uremia sx, still urinates  Physical Examination  Filed Vitals:   01/19/13 1324  BP: 177/68  Pulse: 62  Height: 5\' 7"  (1.702 m)  Weight: 168 lb (76.204 kg)  SpO2: 98%   Body mass index is 26.31 kg/(m^2).  General: A&O x 3, WD, elderly  Pulmonary: Sym exp, good air movt, CTAB, no rales, rhonchi, & wheezing  Cardiac: RRR, Nl S1, S2, no Murmurs, rubs or gallops  Vascular: Vessel Right Left  Radial Palpable Palpable  Ulnar Not Palpable Not Palpable  Brachial Palpable Palpable   Gastrointestinal: soft, NTND, -G/R, - HSM, - masses, - CVAT B  Musculoskeletal: M/S 5/5 throughout , Extremities without  ischemic changes ,  palpable thrill in access in L BVT, + strong bruit in access, firm mass with palpable pulse distal to BVT  Neurologic: Pain and light touch intact in extremities , Motor exam as listed above   Medical Decision Making  [name] is a 79 y.o. male who presents with nearing ESRD requiring hemodialysis.   This patient's L BVT is ready for use.  The previous residual vein segment attached to the brachial vein has likely thrombosed, so I doubt any intervention is needed.  The patient can follow up with [place] as needed.Adele Barthel, MD [facility] Office: [phone] Pager: [phone]  01/19/2013, 6:39 PM

## 2013-02-14 DIAGNOSIS — Z79899 Other long term (current) drug therapy: Secondary | ICD-10-CM | POA: Diagnosis not present

## 2013-02-14 DIAGNOSIS — D649 Anemia, unspecified: Secondary | ICD-10-CM | POA: Diagnosis not present

## 2013-02-14 DIAGNOSIS — N185 Chronic kidney disease, stage 5: Secondary | ICD-10-CM | POA: Diagnosis not present

## 2013-02-14 DIAGNOSIS — I10 Essential (primary) hypertension: Secondary | ICD-10-CM | POA: Diagnosis not present

## 2013-02-14 DIAGNOSIS — R809 Proteinuria, unspecified: Secondary | ICD-10-CM | POA: Diagnosis not present

## 2013-04-12 DIAGNOSIS — D649 Anemia, unspecified: Secondary | ICD-10-CM | POA: Diagnosis not present

## 2013-04-12 DIAGNOSIS — N185 Chronic kidney disease, stage 5: Secondary | ICD-10-CM | POA: Diagnosis not present

## 2013-04-12 DIAGNOSIS — Z79899 Other long term (current) drug therapy: Secondary | ICD-10-CM | POA: Diagnosis not present

## 2013-05-31 DIAGNOSIS — D631 Anemia in chronic kidney disease: Secondary | ICD-10-CM | POA: Diagnosis not present

## 2013-05-31 DIAGNOSIS — R809 Proteinuria, unspecified: Secondary | ICD-10-CM | POA: Diagnosis not present

## 2013-05-31 DIAGNOSIS — N185 Chronic kidney disease, stage 5: Secondary | ICD-10-CM | POA: Diagnosis not present

## 2013-05-31 DIAGNOSIS — Z5189 Encounter for other specified aftercare: Secondary | ICD-10-CM | POA: Diagnosis not present

## 2013-07-02 DIAGNOSIS — I10 Essential (primary) hypertension: Secondary | ICD-10-CM | POA: Diagnosis not present

## 2013-07-02 DIAGNOSIS — E1129 Type 2 diabetes mellitus with other diabetic kidney complication: Secondary | ICD-10-CM | POA: Diagnosis not present

## 2013-07-02 DIAGNOSIS — R809 Proteinuria, unspecified: Secondary | ICD-10-CM | POA: Diagnosis not present

## 2013-07-02 DIAGNOSIS — N185 Chronic kidney disease, stage 5: Secondary | ICD-10-CM | POA: Diagnosis not present

## 2013-07-02 DIAGNOSIS — Z79899 Other long term (current) drug therapy: Secondary | ICD-10-CM | POA: Diagnosis not present

## 2013-07-02 DIAGNOSIS — D649 Anemia, unspecified: Secondary | ICD-10-CM | POA: Diagnosis not present

## 2013-08-16 DIAGNOSIS — Z79899 Other long term (current) drug therapy: Secondary | ICD-10-CM | POA: Diagnosis not present

## 2013-08-16 DIAGNOSIS — E559 Vitamin D deficiency, unspecified: Secondary | ICD-10-CM | POA: Diagnosis not present

## 2013-08-16 DIAGNOSIS — N185 Chronic kidney disease, stage 5: Secondary | ICD-10-CM | POA: Diagnosis not present

## 2013-08-16 DIAGNOSIS — D649 Anemia, unspecified: Secondary | ICD-10-CM | POA: Diagnosis not present

## 2013-10-11 DIAGNOSIS — D631 Anemia in chronic kidney disease: Secondary | ICD-10-CM | POA: Diagnosis not present

## 2013-10-11 DIAGNOSIS — I10 Essential (primary) hypertension: Secondary | ICD-10-CM | POA: Diagnosis not present

## 2013-10-11 DIAGNOSIS — E559 Vitamin D deficiency, unspecified: Secondary | ICD-10-CM | POA: Diagnosis not present

## 2013-10-11 DIAGNOSIS — N185 Chronic kidney disease, stage 5: Secondary | ICD-10-CM | POA: Diagnosis not present

## 2013-10-11 DIAGNOSIS — Z79899 Other long term (current) drug therapy: Secondary | ICD-10-CM | POA: Diagnosis not present

## 2013-10-26 ENCOUNTER — Encounter (HOSPITAL_COMMUNITY): Payer: Medicare Other | Attending: Hematology and Oncology

## 2013-10-26 ENCOUNTER — Ambulatory Visit (HOSPITAL_COMMUNITY)
Admission: RE | Admit: 2013-10-26 | Discharge: 2013-10-26 | Disposition: A | Discharge status: Home / Self Care | Payer: Medicare Other | Source: Ambulatory Visit | Attending: Physician Assistant | Admitting: Physician Assistant

## 2013-10-26 DIAGNOSIS — J4489 Other specified chronic obstructive pulmonary disease: Secondary | ICD-10-CM | POA: Insufficient documentation

## 2013-10-26 DIAGNOSIS — J438 Other emphysema: Secondary | ICD-10-CM | POA: Insufficient documentation

## 2013-10-26 DIAGNOSIS — Z85118 Personal history of other malignant neoplasm of bronchus and lung: Secondary | ICD-10-CM | POA: Insufficient documentation

## 2013-10-26 DIAGNOSIS — C3492 Malignant neoplasm of unspecified part of left bronchus or lung: Secondary | ICD-10-CM

## 2013-10-26 DIAGNOSIS — J449 Chronic obstructive pulmonary disease, unspecified: Secondary | ICD-10-CM | POA: Insufficient documentation

## 2013-10-26 DIAGNOSIS — G622 Polyneuropathy due to other toxic agents: Secondary | ICD-10-CM

## 2013-10-26 DIAGNOSIS — Z85528 Personal history of other malignant neoplasm of kidney: Secondary | ICD-10-CM | POA: Insufficient documentation

## 2013-10-26 DIAGNOSIS — N186 End stage renal disease: Secondary | ICD-10-CM | POA: Diagnosis not present

## 2013-10-26 DIAGNOSIS — Z902 Acquired absence of lung [part of]: Secondary | ICD-10-CM | POA: Insufficient documentation

## 2013-10-26 DIAGNOSIS — Z905 Acquired absence of kidney: Secondary | ICD-10-CM | POA: Insufficient documentation

## 2013-10-26 DIAGNOSIS — G63 Polyneuropathy in diseases classified elsewhere: Secondary | ICD-10-CM

## 2013-10-26 DIAGNOSIS — K769 Liver disease, unspecified: Secondary | ICD-10-CM | POA: Insufficient documentation

## 2013-10-26 DIAGNOSIS — G6189 Other inflammatory polyneuropathies: Secondary | ICD-10-CM | POA: Insufficient documentation

## 2013-10-26 DIAGNOSIS — Z87891 Personal history of nicotine dependence: Secondary | ICD-10-CM | POA: Insufficient documentation

## 2013-10-26 DIAGNOSIS — D472 Monoclonal gammopathy: Secondary | ICD-10-CM | POA: Diagnosis not present

## 2013-10-26 DIAGNOSIS — Z992 Dependence on renal dialysis: Secondary | ICD-10-CM | POA: Diagnosis not present

## 2013-10-26 DIAGNOSIS — IMO0001 Reserved for inherently not codable concepts without codable children: Secondary | ICD-10-CM

## 2013-10-26 DIAGNOSIS — C3490 Malignant neoplasm of unspecified part of unspecified bronchus or lung: Secondary | ICD-10-CM

## 2013-10-26 NOTE — Progress Notes (Signed)
Labs drawn for multiple myeloma, kappa/lambda. Did not draw cbcd or cmp. They were drawn at his Dr.'s office.

## 2013-10-29 LAB — KAPPA/LAMBDA LIGHT CHAINS
KAPPA, LAMDA LIGHT CHAIN RATIO: 2.34 — AB (ref 0.26–1.65)
Kappa free light chain: 15.7 mg/dL — ABNORMAL HIGH (ref 0.33–1.94)
LAMDA FREE LIGHT CHAINS: 6.71 mg/dL — AB (ref 0.57–2.63)

## 2013-10-30 ENCOUNTER — Encounter (HOSPITAL_COMMUNITY): Payer: Self-pay

## 2013-10-30 ENCOUNTER — Encounter (HOSPITAL_BASED_OUTPATIENT_CLINIC_OR_DEPARTMENT_OTHER): Payer: Medicare Other

## 2013-10-30 VITALS — BP 119/63 | HR 71 | Temp 98.2°F | Resp 18 | Wt 161.4 lb

## 2013-10-30 DIAGNOSIS — G63 Polyneuropathy in diseases classified elsewhere: Secondary | ICD-10-CM

## 2013-10-30 DIAGNOSIS — R809 Proteinuria, unspecified: Secondary | ICD-10-CM

## 2013-10-30 DIAGNOSIS — C3410 Malignant neoplasm of upper lobe, unspecified bronchus or lung: Secondary | ICD-10-CM

## 2013-10-30 DIAGNOSIS — Z85528 Personal history of other malignant neoplasm of kidney: Secondary | ICD-10-CM

## 2013-10-30 DIAGNOSIS — D472 Monoclonal gammopathy: Secondary | ICD-10-CM

## 2013-10-30 DIAGNOSIS — C3492 Malignant neoplasm of unspecified part of left bronchus or lung: Secondary | ICD-10-CM

## 2013-10-30 DIAGNOSIS — Z992 Dependence on renal dialysis: Secondary | ICD-10-CM

## 2013-10-30 DIAGNOSIS — IMO0001 Reserved for inherently not codable concepts without codable children: Secondary | ICD-10-CM

## 2013-10-30 DIAGNOSIS — N189 Chronic kidney disease, unspecified: Secondary | ICD-10-CM

## 2013-10-30 LAB — MULTIPLE MYELOMA PANEL, SERUM
ALPHA-1-GLOBULIN: 5.9 % — AB (ref 2.9–4.9)
Albumin ELP: 57.1 % (ref 55.8–66.1)
Alpha-2-Globulin: 10.5 % (ref 7.1–11.8)
BETA 2: 4.5 % (ref 3.2–6.5)
Beta Globulin: 6.4 % (ref 4.7–7.2)
GAMMA GLOBULIN: 15.6 % (ref 11.1–18.8)
IgA: 241 mg/dL (ref 68–379)
IgG (Immunoglobin G), Serum: 1050 mg/dL (ref 650–1600)
IgM, Serum: 74 mg/dL (ref 41–251)
M-SPIKE, %: NOT DETECTED g/dL
Total Protein: 6 g/dL (ref 6.0–8.3)

## 2013-10-30 NOTE — Patient Instructions (Signed)
[facility] Discharge Instructions  RECOMMENDATIONS MADE BY THE CONSULTANT AND ANY TEST RESULTS WILL BE SENT TO YOUR REFERRING PHYSICIAN.  EXAM FINDINGS BY THE PHYSICIAN TODAY AND SIGNS OR SYMPTOMS TO REPORT TO CLINIC OR PRIMARY PHYSICIAN: Exam and findings as discussed by Dr. Barnet Glasgow.  Scans are stable.  Report cough, blood in sputum, unexplained weight loss, etc.  MEDICATIONS PRESCRIBED:  none  INSTRUCTIONS/FOLLOW-UP: Follow-up in 1 year with labs, CT of chest and office visit.  Thank you for choosing [facility] to provide your oncology and hematology care.  To afford each patient quality time with our providers, please arrive at least 15 minutes before your scheduled appointment time.  With your help, our goal is to use those 15 minutes to complete the necessary work-up to ensure our physicians have the information they need to help with your evaluation and healthcare recommendations.    Effective January 1st, 2014, we ask that you re-schedule your appointment with our physicians should you arrive 10 or more minutes late for your appointment.  We strive to give you quality time with our providers, and arriving late affects you and other patients whose appointments are after yours.    Again, thank you for choosing [facility].  Our hope is that these requests will decrease the amount of time that you wait before being seen by our physicians.       _____________________________________________________________  Should you have questions after your visit to [facility], please contact our office at [phone] between the hours of 8:30 a.m. and 4:30 p.m.  Voicemails left after 4:30 p.m. will not be returned until the following business day.  For prescription refill requests, have your pharmacy contact our office with your prescription refill request.    _______________________________________________________________  We hope  that we have given you very good care.  You may receive a patient satisfaction survey in the mail, please complete it and return it as soon as possible.  We value your feedback!

## 2013-10-30 NOTE — Progress Notes (Signed)
[facility]  OFFICE PROGRESS NOTE  Leonides Grills, MD [place] [address]  DIAGNOSIS: Non-small cell cancer of left lung - Plan: CBC with Differential, Comprehensive metabolic panel, CBC with Differential, Comprehensive metabolic panel  MGUS (monoclonal gammopathy of unknown significance) - Plan: CBC with Differential, Comprehensive metabolic panel, Multiple myeloma panel, serum, Kappa/lambda light chains, Lactate dehydrogenase  Neuropathy associated with monoclonal protein - Plan: Kappa/lambda light chains  History of kidney cancer - Plan: Comprehensive metabolic panel, Comprehensive metabolic panel, Lactate dehydrogenase  Chronic kidney disease (CKD), hemodialysis preferred modality - Plan: Comprehensive metabolic panel  No chief complaint on file.   CURRENT THERAPY: Watchful expectation  INTERVAL HISTORY: [name] 80 y.o. male returns for followup of stage I (T1, N0, M0) bronchoalveolar lung cancer, status post left upper lobectomy in March of 2003 being followed with CT scans in the setting of renal cell carcinoma diagnosed on 08/31/1999, currently with end-stage renal disease and nephrotic syndrome. He has been on prednisone for many years for inflammatory demyelinating polymyelo neuropathy. He was last seen here one year ago contemplating whether or not to start dialysis. He just began hemodialysis yesterday. There was significant difficulty accessing his fistula which has been in place for about a year and a half. The primary problem is pain. He has had no lower 70 swelling or redness, but has been developing uremic symptoms with anorexia and weakness. He denies any chest pain, abdominal pain, and still makes urine. He denies any fever, night sweats, cough, wheezing, hematuria, melena, hematochezia, epistaxis, or hemoptysis.   MEDICAL HISTORY: Past Medical History  Diagnosis Date  . BPH (benign prostatic  hypertrophy)   . GERD (gastroesophageal reflux disease)   . Hypertension     stress test done - many years ago  . Arthritis     ankle , hip, knees , low back   . 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 /w prednisone x 17 yrs. has been d/c for 3 yrs.   [name] Kitchen COPD (chronic obstructive pulmonary disease)     told that he has "a bit of emphysema"  . Chronic kidney disease     ,not on hemo as of yet  . Cancer     lung  . Cancer 2001    renal  . Kidney calculus     INTERIM HISTORY: has Chronic kidney disease, stage IV (severe); Pre-operative cardiovascular examination; End stage renal disease; Other complications due to renal dialysis device, implant, and graft; Aftercare following surgery of the circulatory system, NEC; Encounter for adequacy testing for hemodialysis; Swollen arm; Visit for wound check; and Non-small cell cancer of left lung on his problem list.    ALLERGIES:  has No Known Allergies.  MEDICATIONS: has a current medication list which includes the following prescription(s): alfuzosin, amlodipine, calcitriol, calcium acetate, vitamin d, doxazosin, fluticasone, metoprolol succinate, oxycodone, oxymetazoline, sodium bicarbonate, and tizanidine.  SURGICAL HISTORY:  Past Surgical History  Procedure Laterality Date  . Nephrectomy  2001    left  . Lung removal, partial      partial, right  . Ankle fusion      R ankle  . Cholecystectomy    . Joint replacement      L knee- 2010- [facility], L hip partial replacement   . Av fistula placement  06/01/2011    Procedure: ARTERIOVENOUS (AV) FISTULA CREATION;  Surgeon: Angelia Mould, MD;  Location: [facility];  Service: Vascular;  Laterality: Left;  Creation of Left Arteriovenous fistula  . Fistulogram left radial cephalic av fistula  [id number]      Dr. Scot Dock  . Hip arthroplasty Right     "partial"  . Av fistula placement Left 07/12/2012    Procedure: ARTERIOVENOUS (AV) FISTULA CREATION;   Surgeon: Conrad [place], MD;  Location: [facility];  Service: Vascular;  Laterality: Left;  . Av fistula placement Left 4.11.14  . Bascilic vein transposition Left 08/28/2012    Procedure: BASCILIC VEIN TRANSPOSITION;  Surgeon: Conrad Dubberly, MD;  Location: [facility];  Service: Vascular;  Laterality: Left;  left 2nd stage basilic vein transposition    FAMILY HISTORY: family history includes Heart attack in his father; Heart disease in his father and mother. There is no history of Anesthesia problems, Hypotension, Malignant hyperthermia, or Pseudochol deficiency.  SOCIAL HISTORY:  reports that he quit smoking about 36 years ago. His smoking use included Cigarettes. He smoked 0.00 packs per day for 35 years. His smokeless tobacco use includes Chew. He reports that he does not drink alcohol or use illicit drugs.  REVIEW OF SYSTEMS:  Other than that discussed above is noncontributory.  PHYSICAL EXAMINATION: ECOG PERFORMANCE STATUS: 1 - Symptomatic but completely ambulatory  There were no vitals taken for this visit.  GENERAL:alert, no distress and comfortable SKIN: skin color, texture, turgor are normal, no rashes or significant lesions EYES: PERLA; Conjunctiva are pink and non-injected, sclera clear SINUSES: No redness or tenderness over maxillary or ethmoid sinuses OROPHARYNX:no exudate, no erythema on lips, buccal mucosa, or tongue. NECK: supple, thyroid normal size, non-tender, without nodularity. No masses CHEST: Normal AP diameter with no breast masses. LYMPH:  no palpable lymphadenopathy in the cervical, axillary or inguinal LUNGS: clear to auscultation and percussion with normal breathing effort HEART: regular rate & rhythm and no murmurs. ABDOMEN:abdomen soft, non-tender and normal bowel sounds MUSCULOSKELETAL:no cyanosis of digits and no clubbing. Range of motion normal. Left upper extremity fistula in place above the elbow with good bruit. NEURO: alert & oriented x 3 with fluent speech, no  focal motor/sensory deficits   LABORATORY DATA: Lab on 10/26/2013  Component Date Value Ref Range Status  . Total Protein 10/26/2013 PENDING  6.0 - 8.3 g/dL Incomplete  . Albumin ELP 10/26/2013 PENDING  55.8 - 66.1 % Incomplete  . Alpha-1-Globulin 10/26/2013 PENDING  2.9 - 4.9 % Incomplete  . Alpha-2-Globulin 10/26/2013 PENDING  7.1 - 11.8 % Incomplete  . Beta Globulin 10/26/2013 PENDING  4.7 - 7.2 % Incomplete  . Beta 2 10/26/2013 PENDING  3.2 - 6.5 % Incomplete  . Gamma Globulin 10/26/2013 PENDING  11.1 - 18.8 % Incomplete  . M-Spike, % 10/26/2013 PENDING   Incomplete  . SPE Interp. 10/26/2013 PENDING   Incomplete  . Comment 10/26/2013 PENDING   Incomplete  . IgM (Immunoglobin M), Srm 10/26/2013 A  60 - 263 mg/dL Corrected  . IgG (Immunoglobin G), Serum 10/26/2013 PENDING  694 - 1618 mg/dL Incomplete  . IgA 10/26/2013 PENDING  68 - 378 mg/dL Incomplete  . IgM, Serum 10/26/2013 PENDING  60 - 263 mg/dL Incomplete  . Immunofix Electr Int 10/26/2013 PENDING   Incomplete  . Kappa free light chain 10/26/2013 15.70* 0.33 - 1.94 mg/dL Final  . Lamda free light chains 10/26/2013 6.71* 0.57 - 2.63 mg/dL Final  . Kappa, lamda light chain ratio 10/26/2013 2.34* 0.26 - 1.65 Final   Performed at [facility]: No new pathology.  Urinalysis    Component Value Date/Time   COLORURINE YELLOW 01/01/2009 1054   APPEARANCEUR CLEAR 01/01/2009 1054   LABSPEC 1.018 01/01/2009 1054   PHURINE 5.0 01/01/2009 1054   GLUCOSEU NEGATIVE 01/01/2009 1054   HGBUR NEGATIVE 01/01/2009 1054   BILIRUBINUR NEGATIVE 01/01/2009 1054   [place] 01/01/2009 1054   PROTEINUR 100* 01/01/2009 1054   UROBILINOGEN 0.2 01/01/2009 1054   NITRITE NEGATIVE 01/01/2009 1054   [place] 01/01/2009 1054    RADIOGRAPHIC STUDIES: Ct Chest Wo Contrast  10/26/2013   CLINICAL DATA:  History of right nephrectomy for malignancy; history of lung malignancy ; now with weakness for several weeks  EXAM:  CT CHEST WITHOUT CONTRAST  TECHNIQUE: Multidetector CT imaging of the chest was performed following the standard protocol without IV contrast.  COMPARISON:  CT scan of the chest of October 25, 2012  FINDINGS: There are emphysematous changes bilaterally. Stable postsurgical change in the left lower lobe is demonstrated. There is a stable 5 mm diameter nodule laterally in the right lower lobe. A stable posterior right lower lobe nodule measures 6 mm today and previously measured 5 mm likely due to changes in patient positioning. In the right upper lobe on image 16 of series 3 there is an approximately 3 mm diameter nodule which is stable allowing for differences in patient positioning. There is no interstitial nor alveolar infiltrate.  The cardiac chambers are normal in size. There are faint coronary artery calcifications. There is no mediastinal nor hilar lymphadenopathy. The thyroid gland and thoracic esophagus are normal. There is no pleural nor pericardial effusion. Within the upper abdomen there is a stable 9 mm calcified granuloma calcification in the right hepatic lobe. The adrenal glands are normal. The right kidney is surgically absent. The observed portions of the left kidney are stable.  The bony thorax exhibits no acute abnormality. Degenerative disc space narrowing is present at multiple levels.  IMPRESSION: 1. There is no acute pulmonary parenchymal or mediastinal lesion. There are emphysematous and postsurgical changes demonstrated. There is no evidence of metastatic disease. 2. There is no evidence of CHF. 3. The visualized portions of the upper abdomen exhibit no acute abnormalities.   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 Problem #2 COPD secondary to long-standing smoking history. With  baseline shortness of breath  Problem #3 renal cell carcinoma (T2 N0) status post right nephrectomy on 08/31/1999 by Dr. Maryland Pink thus far without recurrent disease  Problem #4 renal failure seeing Dr. Lowanda Foster, having just begun hemodialysis yesterday. Problem #5 minimal MGUS with no evidence of M spike at this time nor does he have Bence-Jones proteinuria.  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  Problem #7 right hand tendon issues, Dr.Kuzma following.   PLAN:  #1. Continue watchful expectation with repeat visit in one year along with CT scan of the chest without contrast, CBC, chem profile, LDH   All questions were answered. The patient knows to call the clinic with any problems, questions or concerns. We can certainly see the patient much sooner if necessary.   I spent 25 minutes counseling the patient face to face. The total time spent in the appointment was 30 minutes.    Doroteo Bradford, MD 10/30/2013  10:13 AM  DISCLAIMER:  This note was dictated with voice recognition software.  Similar sounding words can inadvertently be transcribed inaccurately and may not be corrected upon review.

## 2014-03-26 ENCOUNTER — Other Ambulatory Visit: Payer: Self-pay | Admitting: *Deleted

## 2014-03-26 DIAGNOSIS — T82510S Breakdown (mechanical) of surgically created arteriovenous fistula, sequela: Secondary | ICD-10-CM

## 2014-04-18 ENCOUNTER — Encounter (HOSPITAL_COMMUNITY): Payer: Self-pay | Admitting: Vascular Surgery

## 2014-04-22 ENCOUNTER — Encounter: Payer: Self-pay | Admitting: Vascular Surgery

## 2014-04-23 ENCOUNTER — Ambulatory Visit (INDEPENDENT_AMBULATORY_CARE_PROVIDER_SITE_OTHER): Payer: Medicare Other | Admitting: Vascular Surgery

## 2014-04-23 ENCOUNTER — Ambulatory Visit (HOSPITAL_COMMUNITY)
Admission: RE | Admit: 2014-04-23 | Discharge: 2014-04-23 | Disposition: A | Discharge status: Home / Self Care | Payer: Medicare Other | Source: Ambulatory Visit | Attending: Vascular Surgery | Admitting: Vascular Surgery

## 2014-04-23 ENCOUNTER — Encounter: Payer: Self-pay | Admitting: Vascular Surgery

## 2014-04-23 ENCOUNTER — Other Ambulatory Visit: Payer: Self-pay

## 2014-04-23 VITALS — BP 135/91 | HR 68 | Ht 67.0 in | Wt 168.0 lb

## 2014-04-23 DIAGNOSIS — T82510S Breakdown (mechanical) of surgically created arteriovenous fistula, sequela: Secondary | ICD-10-CM | POA: Insufficient documentation

## 2014-04-23 DIAGNOSIS — N186 End stage renal disease: Secondary | ICD-10-CM

## 2014-04-23 DIAGNOSIS — Y832 Surgical operation with anastomosis, bypass or graft as the cause of abnormal reaction of the patient, or of later complication, without mention of misadventure at the time of the procedure: Secondary | ICD-10-CM | POA: Diagnosis not present

## 2014-04-23 NOTE — Progress Notes (Signed)
Subjective:     Patient ID: [name], male   DOB: 1931-01-29, 80 y.o.   MRN: [id number]  HPI this 80 year old male was referred by Dr. Carson Myrtle for evaluation of left upper arm AV fistula which has been difficult to cannulate. Fistula was created by Dr. Bridgett Larsson in April 2014 and completed in June 2014. Patient was not on dialysis at that time. Denies any pain or numbness in the left hand. He states that on some occasions the fistula works very well but on other occasions it is difficult to access.  Past Medical History  Diagnosis Date  . BPH (benign prostatic hypertrophy)   . GERD (gastroesophageal reflux disease)   . Hypertension     stress test done - many years ago  . Arthritis     ankle , hip, knees , low back   . 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 /w prednisone x 17 yrs. has been d/c for 3 yrs.   [name] Kitchen COPD (chronic obstructive pulmonary disease)     told that he has "a bit of emphysema"  . Chronic kidney disease     ,not on hemo as of yet  . Cancer     lung  . Cancer 2001    renal  . Kidney calculus   . Dialysis patient     History  Substance Use Topics  . Smoking status: Former Smoker -- 35 years    Types: Cigarettes    Quit date: 02/04/1977  . Smokeless tobacco: Current User    Types: Chew  . Alcohol Use: No     Comment: beer- x2/ mth    Family History  Problem Relation Age of Onset  . Anesthesia problems Neg Hx   . Hypotension Neg Hx   . Malignant hyperthermia Neg Hx   . Pseudochol deficiency Neg Hx   . Heart disease Mother   . Heart disease Father   . Heart attack Father     No Known Allergies  Current outpatient prescriptions: alfuzosin (UROXATRAL) 10 MG 24 hr tablet, Take 10 mg by mouth at bedtime., Disp: , Rfl: ;  amLODipine (NORVASC) 5 MG tablet, Take 5 mg by mouth every morning. , Disp: , Rfl: ;  calcitRIOL (ROCALTROL) 0.5 MCG capsule, daily. , Disp: , Rfl: ;  calcium acetate (PHOSLO) 667 MG capsule, Take 1  capsule by mouth daily., Disp: , Rfl:  Cholecalciferol (VITAMIN D) 1000 UNITS capsule, Take 5,000 Units by mouth every morning. , Disp: , Rfl: ;  doxazosin (CARDURA) 4 MG tablet, Take 4 mg by mouth at bedtime. , Disp: , Rfl: ;  fluticasone (FLONASE) 50 MCG/ACT nasal spray, , Disp: , Rfl: ;  metoprolol succinate (TOPROL-XL) 25 MG 24 hr tablet, Take 25 mg by mouth every morning. , Disp: , Rfl:  oxyCODONE (ROXICODONE) 5 MG immediate release tablet, Take 1 tablet (5 mg total) by mouth every 4 (four) hours as needed for pain., Disp: 30 tablet, Rfl: 0;  oxymetazoline (AFRIN) 0.05 % nasal spray, Place 2 sprays into the nose daily as needed for congestion., Disp: , Rfl: ;  sodium bicarbonate 650 MG tablet, Take 1,300 mg by mouth 3 (three) times daily after meals., Disp: , Rfl: ;  tiZANidine (ZANAFLEX) 4 MG tablet, , Disp: , Rfl:   BP 135/91 mmHg  Pulse 68  Ht 5\' 7"  (1.702 m)  Wt 168 lb (76.204 kg)  BMI 26.31 kg/m2  SpO2 100%  Body mass index is 26.31 kg/(m^2).  Review of Systems denies chest pain, dyspnea on exertion, PND, orthopnea, hemoptysis     Objective:   Physical Exam BP 135/91 mmHg  Pulse 68  Ht 5\' 7"  (1.702 m)  Wt 168 lb (76.204 kg)  BMI 26.31 kg/m2  SpO2 100%  . Gen. well-developed well-nourished male no apparent stress alert and oriented 3 Lungs no rhonchi or wheezing Cardiovascular regular rhythm no murmurs Left upper extremity with 2+ radial pulse palpable and well-perfused left hand. Upper arm basilic vein transposition fistula has excellent pulse and palpable thrill. Incisions all healed nicely.  Today I ordered a fistulogram which I've reviewed and interpreted. There is some wall thickening and a few areas with some partially occluding thrombus possibly. All in all the fistula is widely patent.     Assessment:     End-stage renal disease with left basilic vein transposition fistula created by Dr. Vallarie Mare in April 2014. Now fistula difficult to cannulate on  certain occasions    Plan:     We'll schedule fistulogram left arm AV fistula by Dr. Bridgett Larsson on December 31. Possible intervention at that time if indicated. Patient ready to proceed.

## 2014-05-08 MED ORDER — SODIUM CHLORIDE 0.9 % IJ SOLN: 3.0000 mL | INTRAMUSCULAR | Status: DC | PRN

## 2014-05-09 ENCOUNTER — Telehealth: Payer: Self-pay | Admitting: Vascular Surgery

## 2014-05-09 ENCOUNTER — Encounter (HOSPITAL_COMMUNITY)
Admission: RE | Disposition: A | Discharge status: Home / Self Care | Payer: Self-pay | Source: Ambulatory Visit | Attending: Vascular Surgery

## 2014-05-09 ENCOUNTER — Ambulatory Visit (HOSPITAL_COMMUNITY)
Admission: RE | Admit: 2014-05-09 | Discharge: 2014-05-09 | Disposition: A | Discharge status: Home / Self Care | Payer: Medicare Other | Source: Ambulatory Visit | Attending: Vascular Surgery | Admitting: Vascular Surgery

## 2014-05-09 ENCOUNTER — Other Ambulatory Visit: Payer: Self-pay | Admitting: *Deleted

## 2014-05-09 ENCOUNTER — Encounter (HOSPITAL_COMMUNITY): Payer: Self-pay | Admitting: Vascular Surgery

## 2014-05-09 DIAGNOSIS — Z992 Dependence on renal dialysis: Secondary | ICD-10-CM | POA: Insufficient documentation

## 2014-05-09 DIAGNOSIS — N186 End stage renal disease: Secondary | ICD-10-CM | POA: Diagnosis not present

## 2014-05-09 DIAGNOSIS — Z87442 Personal history of urinary calculi: Secondary | ICD-10-CM | POA: Insufficient documentation

## 2014-05-09 DIAGNOSIS — M1389 Other specified arthritis, multiple sites: Secondary | ICD-10-CM | POA: Insufficient documentation

## 2014-05-09 DIAGNOSIS — I120 Hypertensive chronic kidney disease with stage 5 chronic kidney disease or end stage renal disease: Secondary | ICD-10-CM | POA: Diagnosis not present

## 2014-05-09 DIAGNOSIS — Z85528 Personal history of other malignant neoplasm of kidney: Secondary | ICD-10-CM | POA: Diagnosis not present

## 2014-05-09 DIAGNOSIS — J449 Chronic obstructive pulmonary disease, unspecified: Secondary | ICD-10-CM | POA: Insufficient documentation

## 2014-05-09 DIAGNOSIS — Y69 Unspecified misadventure during surgical and medical care: Secondary | ICD-10-CM | POA: Diagnosis not present

## 2014-05-09 DIAGNOSIS — G6181 Chronic inflammatory demyelinating polyneuritis: Secondary | ICD-10-CM | POA: Diagnosis not present

## 2014-05-09 DIAGNOSIS — I440 Atrioventricular block, first degree: Secondary | ICD-10-CM | POA: Insufficient documentation

## 2014-05-09 DIAGNOSIS — K219 Gastro-esophageal reflux disease without esophagitis: Secondary | ICD-10-CM | POA: Diagnosis not present

## 2014-05-09 DIAGNOSIS — T82318A Breakdown (mechanical) of other vascular grafts, initial encounter: Secondary | ICD-10-CM | POA: Insufficient documentation

## 2014-05-09 DIAGNOSIS — Z4931 Encounter for adequacy testing for hemodialysis: Secondary | ICD-10-CM

## 2014-05-09 DIAGNOSIS — N400 Enlarged prostate without lower urinary tract symptoms: Secondary | ICD-10-CM | POA: Insufficient documentation

## 2014-05-09 DIAGNOSIS — Z85118 Personal history of other malignant neoplasm of bronchus and lung: Secondary | ICD-10-CM | POA: Insufficient documentation

## 2014-05-09 DIAGNOSIS — Z87891 Personal history of nicotine dependence: Secondary | ICD-10-CM | POA: Insufficient documentation

## 2014-05-09 DIAGNOSIS — T82898A Other specified complication of vascular prosthetic devices, implants and grafts, initial encounter: Secondary | ICD-10-CM

## 2014-05-09 HISTORY — PX: SHUNTOGRAM: SHX5491

## 2014-05-09 LAB — POCT I-STAT, CHEM 8
BUN: 36 mg/dL — ABNORMAL HIGH (ref 6–23)
CALCIUM ION: 1.12 mmol/L — AB (ref 1.13–1.30)
CREATININE: 5.6 mg/dL — AB (ref 0.50–1.35)
Chloride: 100 mEq/L (ref 96–112)
GLUCOSE: 104 mg/dL — AB (ref 70–99)
HCT: 33 % — ABNORMAL LOW (ref 39.0–52.0)
HEMOGLOBIN: 11.2 g/dL — AB (ref 13.0–17.0)
Potassium: 3.9 mmol/L (ref 3.5–5.1)
Sodium: 139 mmol/L (ref 135–145)
TCO2: 24 mmol/L (ref 0–100)

## 2014-05-09 SURGERY — ASSESSMENT, SHUNT FUNCTION, WITH CONTRAST RADIOGRAPHIC STUDY
Anesthesia: LOCAL

## 2014-05-09 MED ORDER — HEPARIN (PORCINE) IN NACL 2-0.9 UNIT/ML-% IJ SOLN
INTRAMUSCULAR | Status: AC
  Filled 2014-05-09: qty 500

## 2014-05-09 MED ORDER — ACETAMINOPHEN 325 MG PO TABS
650.0000 mg | ORAL_TABLET | ORAL | Status: DC | PRN
Start: 2014-05-09 — End: 2014-05-09

## 2014-05-09 MED ORDER — SODIUM CHLORIDE 0.9 % IJ SOLN: 3.0000 mL | Freq: Two times a day (BID) | INTRAMUSCULAR | Status: DC

## 2014-05-09 MED ORDER — SODIUM CHLORIDE 0.9 % IV SOLN: 250.0000 mL | INTRAVENOUS | Status: DC | PRN

## 2014-05-09 MED ORDER — SODIUM CHLORIDE 0.9 % IJ SOLN: 3.0000 mL | INTRAMUSCULAR | Status: DC | PRN

## 2014-05-09 MED ORDER — LIDOCAINE HCL (PF) 1 % IJ SOLN
INTRAMUSCULAR | Status: AC
  Filled 2014-05-09: qty 30

## 2014-05-09 NOTE — Op Note (Signed)
    OPERATIVE NOTE   PROCEDURE: 1. left basilic vein transposition cannulation under ultrasound guidance 2. left arm fistulogram  PRE-OPERATIVE DIAGNOSIS: Difficult to cannulate left basilic vein transposition   POST-OPERATIVE DIAGNOSIS: same as above   SURGEON: Adele Barthel, MD  ANESTHESIA: local  ESTIMATED BLOOD LOSS: 5 cc  FINDING(S): 1. Widely patent fistula throughout including central venous system  SPECIMEN(S):  None  CONTRAST: 12 cc  INDICATIONS: [name] is a 80 y.o. male who  presents with malfunctioning left basilic vein transposition.  The patient is scheduled for left arm fistulogram.  The patient is aware the risks include but are not limited to: bleeding, infection, thrombosis of the cannulated access, and possible anaphylactic reaction to the contrast.  The patient is aware of the risks of the procedure and elects to proceed forward.  DESCRIPTION: After full informed written consent was obtained, the patient was brought back to the angiography suite and placed supine upon the angiography table.  The patient was connected to monitoring equipment.  The left upper arm was prepped and draped in the standard fashion for a left arm fistulogram.  Under ultrasound guidance, the left basilic vein transposition was cannulated with a micropuncture needle.  The microwire was advanced into the fistula and the needle was exchanged for the a microsheath, which was lodged 2 cm into the access.  The wire was removed and the sheath was connected to the IV extension tubing.  Hand injections were completed to image the access from the antecubitum up to the level of axilla.  The central venous structures were also imaged by hand injections.  Based on the images, this patient will need: no immediate intervention.  A 4-0 Monocryl purse-string suture was sewn around the sheath.  The sheath was removed while tying down the suture.  A sterile bandage was applied to the puncture site.   Evaluation of the depth of the fistula may need to be completed with an access duplex.  COMPLICATIONS: none  CONDITION: stable  Adele Barthel, MD [facility] Office: [phone] Pager: [phone]  05/09/2014 7:57 AM

## 2014-05-09 NOTE — Interval H&P Note (Signed)
[facility] [place]  History and Physical Update  The patient was interviewed and re-examined.  The patient's previous History and Physical has been reviewed and is unchanged from Dr. Evelena Leyden consult.  There is no change in the plan of care: L arm fistulogram, possible intervention.  Adele Barthel, MD [facility] [place] Office: [phone] Pager: [phone]  05/09/2014, 7:14 AM

## 2014-05-09 NOTE — H&P (View-Only) (Signed)
Subjective:     Patient ID: [name], male   DOB: 1931-03-01, 80 y.o.   MRN: [id number]  HPI this 80 year old male was referred by Dr. Carson Myrtle for evaluation of left upper arm AV fistula which has been difficult to cannulate. Fistula was created by Dr. Bridgett Larsson in April 2014 and completed in June 2014. Patient was not on dialysis at that time. Denies any pain or numbness in the left hand. He states that on some occasions the fistula works very well but on other occasions it is difficult to access.  Past Medical History  Diagnosis Date  . BPH (benign prostatic hypertrophy)   . GERD (gastroesophageal reflux disease)   . Hypertension     stress test done - many years ago  . Arthritis     ankle , hip, knees , low back   . 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 /w prednisone x 17 yrs. has been d/c for 3 yrs.   [name] Kitchen COPD (chronic obstructive pulmonary disease)     told that he has "a bit of emphysema"  . Chronic kidney disease     ,not on hemo as of yet  . Cancer     lung  . Cancer 2001    renal  . Kidney calculus   . Dialysis patient     History  Substance Use Topics  . Smoking status: Former Smoker -- 35 years    Types: Cigarettes    Quit date: 02/04/1977  . Smokeless tobacco: Current User    Types: Chew  . Alcohol Use: No     Comment: beer- x2/ mth    Family History  Problem Relation Age of Onset  . Anesthesia problems Neg Hx   . Hypotension Neg Hx   . Malignant hyperthermia Neg Hx   . Pseudochol deficiency Neg Hx   . Heart disease Mother   . Heart disease Father   . Heart attack Father     No Known Allergies  Current outpatient prescriptions: alfuzosin (UROXATRAL) 10 MG 24 hr tablet, Take 10 mg by mouth at bedtime., Disp: , Rfl: ;  amLODipine (NORVASC) 5 MG tablet, Take 5 mg by mouth every morning. , Disp: , Rfl: ;  calcitRIOL (ROCALTROL) 0.5 MCG capsule, daily. , Disp: , Rfl: ;  calcium acetate (PHOSLO) 667 MG capsule, Take 1  capsule by mouth daily., Disp: , Rfl:  Cholecalciferol (VITAMIN D) 1000 UNITS capsule, Take 5,000 Units by mouth every morning. , Disp: , Rfl: ;  doxazosin (CARDURA) 4 MG tablet, Take 4 mg by mouth at bedtime. , Disp: , Rfl: ;  fluticasone (FLONASE) 50 MCG/ACT nasal spray, , Disp: , Rfl: ;  metoprolol succinate (TOPROL-XL) 25 MG 24 hr tablet, Take 25 mg by mouth every morning. , Disp: , Rfl:  oxyCODONE (ROXICODONE) 5 MG immediate release tablet, Take 1 tablet (5 mg total) by mouth every 4 (four) hours as needed for pain., Disp: 30 tablet, Rfl: 0;  oxymetazoline (AFRIN) 0.05 % nasal spray, Place 2 sprays into the nose daily as needed for congestion., Disp: , Rfl: ;  sodium bicarbonate 650 MG tablet, Take 1,300 mg by mouth 3 (three) times daily after meals., Disp: , Rfl: ;  tiZANidine (ZANAFLEX) 4 MG tablet, , Disp: , Rfl:   BP 135/91 mmHg  Pulse 68  Ht 5\' 7"  (1.702 m)  Wt 168 lb (76.204 kg)  BMI 26.31 kg/m2  SpO2 100%  Body mass index is 26.31 kg/(m^2).  Review of Systems denies chest pain, dyspnea on exertion, PND, orthopnea, hemoptysis     Objective:   Physical Exam BP 135/91 mmHg  Pulse 68  Ht 5\' 7"  (1.702 m)  Wt 168 lb (76.204 kg)  BMI 26.31 kg/m2  SpO2 100%  . Gen. well-developed well-nourished male no apparent stress alert and oriented 3 Lungs no rhonchi or wheezing Cardiovascular regular rhythm no murmurs Left upper extremity with 2+ radial pulse palpable and well-perfused left hand. Upper arm basilic vein transposition fistula has excellent pulse and palpable thrill. Incisions all healed nicely.  Today I ordered a fistulogram which I've reviewed and interpreted. There is some wall thickening and a few areas with some partially occluding thrombus possibly. All in all the fistula is widely patent.     Assessment:     End-stage renal disease with left basilic vein transposition fistula created by Dr. Vallarie Mare in April 2014. Now fistula difficult to cannulate on  certain occasions    Plan:     We'll schedule fistulogram left arm AV fistula by Dr. Bridgett Larsson on December 31. Possible intervention at that time if indicated. Patient ready to proceed.

## 2014-05-09 NOTE — Telephone Encounter (Addendum)
-----   Message from Mena Goes, RN sent at 05/09/2014  9:50 AM EST ----- Regarding: Schedule   ----- Message -----    From: Conrad Vernon, MD    Sent: 05/09/2014   8:00 AM      To: [facility] [address]  TREYSHON BUCHANON [id number] [birth year]  Procedure: L BVT cannulation with ultrasound guidance, L arm fistulogram  Follow-up: 4 week  Orders(s) for follow-up: L access duplex (evaluate depth) - rsn: difficulty cannulating  notified patient's wife of fu appointment on 06-07-14 with dr. Bridgett Larsson

## 2014-05-09 NOTE — Discharge Instructions (Signed)
Fistulogram, Care After °Refer to this sheet in the next few weeks. These instructions provide you with information on caring for yourself after your procedure. Your health care provider may also give you more specific instructions. Your treatment has been planned according to current medical practices, but problems sometimes occur. Call your health care provider if you have any problems or questions after your procedure. °WHAT TO EXPECT AFTER THE PROCEDURE °After your procedure, it is typical to have the following: °· A small amount of discomfort in the area where the catheters were placed. °· A small amount of bruising around the fistula. °· Sleepiness and fatigue. °HOME CARE INSTRUCTIONS °· Rest at home for the day following your procedure. °· Do not drive or operate heavy machinery while taking pain medicine. °· Take medicines only as directed by your health care provider. °· Do not take baths, swim, or use a hot tub until your health care provider approves. You may shower 24 hours after the procedure or as directed by your health care provider. °· There are many different ways to close and cover an incision, including stitches, skin glue, and adhesive strips. Follow your health care provider's instructions on: °¨ Incision care. °¨ Bandage (dressing) changes and removal. °¨ Incision closure removal. °· Monitor your dialysis fistula carefully. °SEEK MEDICAL CARE IF: °· You have drainage, redness, swelling, or pain at your catheter site. °· You have a fever. °· You have chills. °SEEK IMMEDIATE MEDICAL CARE IF: °· You feel weak. °· You have trouble balancing. °· You have trouble moving your arms or legs. °· You have problems with your speech or vision. °· You can no longer feel a vibration or buzz when you put your fingers over your dialysis fistula. °· The limb that was used for the procedure: °¨ Swells. °¨ Is painful. °¨ Is cold. °¨ Is discolored, such as blue or pale white. °Document Released: 09/10/2013  Document Reviewed: 06/15/2013 °ExitCare® Patient Information ©2015 [employer]. This information is not intended to replace advice given to you by your health care provider. Make sure you discuss any questions you have with your health care provider. ° °

## 2014-05-10 DIAGNOSIS — N2581 Secondary hyperparathyroidism of renal origin: Secondary | ICD-10-CM | POA: Diagnosis not present

## 2014-05-10 DIAGNOSIS — Z992 Dependence on renal dialysis: Secondary | ICD-10-CM | POA: Diagnosis not present

## 2014-05-10 DIAGNOSIS — D631 Anemia in chronic kidney disease: Secondary | ICD-10-CM | POA: Diagnosis not present

## 2014-05-10 DIAGNOSIS — D509 Iron deficiency anemia, unspecified: Secondary | ICD-10-CM | POA: Diagnosis not present

## 2014-05-10 DIAGNOSIS — Z23 Encounter for immunization: Secondary | ICD-10-CM | POA: Diagnosis not present

## 2014-05-10 DIAGNOSIS — N186 End stage renal disease: Secondary | ICD-10-CM | POA: Diagnosis not present

## 2014-05-13 DIAGNOSIS — D631 Anemia in chronic kidney disease: Secondary | ICD-10-CM | POA: Diagnosis not present

## 2014-05-13 DIAGNOSIS — Z992 Dependence on renal dialysis: Secondary | ICD-10-CM | POA: Diagnosis not present

## 2014-05-13 DIAGNOSIS — N186 End stage renal disease: Secondary | ICD-10-CM | POA: Diagnosis not present

## 2014-05-13 DIAGNOSIS — Z23 Encounter for immunization: Secondary | ICD-10-CM | POA: Diagnosis not present

## 2014-05-13 DIAGNOSIS — N2581 Secondary hyperparathyroidism of renal origin: Secondary | ICD-10-CM | POA: Diagnosis not present

## 2014-05-13 DIAGNOSIS — D509 Iron deficiency anemia, unspecified: Secondary | ICD-10-CM | POA: Diagnosis not present

## 2014-05-15 DIAGNOSIS — D509 Iron deficiency anemia, unspecified: Secondary | ICD-10-CM | POA: Diagnosis not present

## 2014-05-15 DIAGNOSIS — D631 Anemia in chronic kidney disease: Secondary | ICD-10-CM | POA: Diagnosis not present

## 2014-05-15 DIAGNOSIS — Z23 Encounter for immunization: Secondary | ICD-10-CM | POA: Diagnosis not present

## 2014-05-15 DIAGNOSIS — N186 End stage renal disease: Secondary | ICD-10-CM | POA: Diagnosis not present

## 2014-05-15 DIAGNOSIS — Z992 Dependence on renal dialysis: Secondary | ICD-10-CM | POA: Diagnosis not present

## 2014-05-15 DIAGNOSIS — N2581 Secondary hyperparathyroidism of renal origin: Secondary | ICD-10-CM | POA: Diagnosis not present

## 2014-05-17 DIAGNOSIS — Z23 Encounter for immunization: Secondary | ICD-10-CM | POA: Diagnosis not present

## 2014-05-17 DIAGNOSIS — D631 Anemia in chronic kidney disease: Secondary | ICD-10-CM | POA: Diagnosis not present

## 2014-05-17 DIAGNOSIS — Z992 Dependence on renal dialysis: Secondary | ICD-10-CM | POA: Diagnosis not present

## 2014-05-17 DIAGNOSIS — D509 Iron deficiency anemia, unspecified: Secondary | ICD-10-CM | POA: Diagnosis not present

## 2014-05-17 DIAGNOSIS — N2581 Secondary hyperparathyroidism of renal origin: Secondary | ICD-10-CM | POA: Diagnosis not present

## 2014-05-17 DIAGNOSIS — N186 End stage renal disease: Secondary | ICD-10-CM | POA: Diagnosis not present

## 2014-05-20 DIAGNOSIS — N186 End stage renal disease: Secondary | ICD-10-CM | POA: Diagnosis not present

## 2014-05-20 DIAGNOSIS — Z23 Encounter for immunization: Secondary | ICD-10-CM | POA: Diagnosis not present

## 2014-05-20 DIAGNOSIS — N2581 Secondary hyperparathyroidism of renal origin: Secondary | ICD-10-CM | POA: Diagnosis not present

## 2014-05-20 DIAGNOSIS — Z992 Dependence on renal dialysis: Secondary | ICD-10-CM | POA: Diagnosis not present

## 2014-05-20 DIAGNOSIS — D509 Iron deficiency anemia, unspecified: Secondary | ICD-10-CM | POA: Diagnosis not present

## 2014-05-20 DIAGNOSIS — D631 Anemia in chronic kidney disease: Secondary | ICD-10-CM | POA: Diagnosis not present

## 2014-05-22 ENCOUNTER — Telehealth: Payer: Self-pay | Admitting: Vascular Surgery

## 2014-05-22 DIAGNOSIS — D509 Iron deficiency anemia, unspecified: Secondary | ICD-10-CM | POA: Diagnosis not present

## 2014-05-22 DIAGNOSIS — Z23 Encounter for immunization: Secondary | ICD-10-CM | POA: Diagnosis not present

## 2014-05-22 DIAGNOSIS — N2581 Secondary hyperparathyroidism of renal origin: Secondary | ICD-10-CM | POA: Diagnosis not present

## 2014-05-22 DIAGNOSIS — D631 Anemia in chronic kidney disease: Secondary | ICD-10-CM | POA: Diagnosis not present

## 2014-05-22 DIAGNOSIS — Z992 Dependence on renal dialysis: Secondary | ICD-10-CM | POA: Diagnosis not present

## 2014-05-22 DIAGNOSIS — N186 End stage renal disease: Secondary | ICD-10-CM | POA: Diagnosis not present

## 2014-05-22 NOTE — Telephone Encounter (Signed)
-----   Message from Merleen Nicely sent at 05/20/2014  1:06 PM EST ----- Regarding: needs to change app. Contact: [phone] Eulas needs to change his appointment. Please call.  Thanks, Ebony Hail

## 2014-05-22 NOTE — Telephone Encounter (Signed)
I attempted to return Mr Sweeden' call, however he was not in. His wife will let him know that we tried to call. dpm

## 2014-05-24 DIAGNOSIS — N2581 Secondary hyperparathyroidism of renal origin: Secondary | ICD-10-CM | POA: Diagnosis not present

## 2014-05-24 DIAGNOSIS — D631 Anemia in chronic kidney disease: Secondary | ICD-10-CM | POA: Diagnosis not present

## 2014-05-24 DIAGNOSIS — D509 Iron deficiency anemia, unspecified: Secondary | ICD-10-CM | POA: Diagnosis not present

## 2014-05-24 DIAGNOSIS — Z23 Encounter for immunization: Secondary | ICD-10-CM | POA: Diagnosis not present

## 2014-05-24 DIAGNOSIS — N186 End stage renal disease: Secondary | ICD-10-CM | POA: Diagnosis not present

## 2014-05-24 DIAGNOSIS — Z992 Dependence on renal dialysis: Secondary | ICD-10-CM | POA: Diagnosis not present

## 2014-05-27 DIAGNOSIS — D631 Anemia in chronic kidney disease: Secondary | ICD-10-CM | POA: Diagnosis not present

## 2014-05-27 DIAGNOSIS — N186 End stage renal disease: Secondary | ICD-10-CM | POA: Diagnosis not present

## 2014-05-27 DIAGNOSIS — Z992 Dependence on renal dialysis: Secondary | ICD-10-CM | POA: Diagnosis not present

## 2014-05-27 DIAGNOSIS — Z23 Encounter for immunization: Secondary | ICD-10-CM | POA: Diagnosis not present

## 2014-05-27 DIAGNOSIS — N2581 Secondary hyperparathyroidism of renal origin: Secondary | ICD-10-CM | POA: Diagnosis not present

## 2014-05-27 DIAGNOSIS — D509 Iron deficiency anemia, unspecified: Secondary | ICD-10-CM | POA: Diagnosis not present

## 2014-05-28 DIAGNOSIS — D508 Other iron deficiency anemias: Secondary | ICD-10-CM | POA: Diagnosis not present

## 2014-05-28 DIAGNOSIS — N185 Chronic kidney disease, stage 5: Secondary | ICD-10-CM | POA: Diagnosis not present

## 2014-05-28 DIAGNOSIS — G6181 Chronic inflammatory demyelinating polyneuritis: Secondary | ICD-10-CM | POA: Diagnosis not present

## 2014-05-28 DIAGNOSIS — D649 Anemia, unspecified: Secondary | ICD-10-CM | POA: Diagnosis not present

## 2014-05-29 DIAGNOSIS — D509 Iron deficiency anemia, unspecified: Secondary | ICD-10-CM | POA: Diagnosis not present

## 2014-05-29 DIAGNOSIS — Z992 Dependence on renal dialysis: Secondary | ICD-10-CM | POA: Diagnosis not present

## 2014-05-29 DIAGNOSIS — D631 Anemia in chronic kidney disease: Secondary | ICD-10-CM | POA: Diagnosis not present

## 2014-05-29 DIAGNOSIS — Z23 Encounter for immunization: Secondary | ICD-10-CM | POA: Diagnosis not present

## 2014-05-29 DIAGNOSIS — N2581 Secondary hyperparathyroidism of renal origin: Secondary | ICD-10-CM | POA: Diagnosis not present

## 2014-05-29 DIAGNOSIS — N186 End stage renal disease: Secondary | ICD-10-CM | POA: Diagnosis not present

## 2014-05-31 DIAGNOSIS — D509 Iron deficiency anemia, unspecified: Secondary | ICD-10-CM | POA: Diagnosis not present

## 2014-05-31 DIAGNOSIS — D631 Anemia in chronic kidney disease: Secondary | ICD-10-CM | POA: Diagnosis not present

## 2014-05-31 DIAGNOSIS — N2581 Secondary hyperparathyroidism of renal origin: Secondary | ICD-10-CM | POA: Diagnosis not present

## 2014-05-31 DIAGNOSIS — Z992 Dependence on renal dialysis: Secondary | ICD-10-CM | POA: Diagnosis not present

## 2014-05-31 DIAGNOSIS — Z23 Encounter for immunization: Secondary | ICD-10-CM | POA: Diagnosis not present

## 2014-05-31 DIAGNOSIS — N186 End stage renal disease: Secondary | ICD-10-CM | POA: Diagnosis not present

## 2014-06-03 DIAGNOSIS — Z992 Dependence on renal dialysis: Secondary | ICD-10-CM | POA: Diagnosis not present

## 2014-06-03 DIAGNOSIS — D631 Anemia in chronic kidney disease: Secondary | ICD-10-CM | POA: Diagnosis not present

## 2014-06-03 DIAGNOSIS — Z23 Encounter for immunization: Secondary | ICD-10-CM | POA: Diagnosis not present

## 2014-06-03 DIAGNOSIS — N2581 Secondary hyperparathyroidism of renal origin: Secondary | ICD-10-CM | POA: Diagnosis not present

## 2014-06-03 DIAGNOSIS — N186 End stage renal disease: Secondary | ICD-10-CM | POA: Diagnosis not present

## 2014-06-03 DIAGNOSIS — D509 Iron deficiency anemia, unspecified: Secondary | ICD-10-CM | POA: Diagnosis not present

## 2014-06-05 DIAGNOSIS — N186 End stage renal disease: Secondary | ICD-10-CM | POA: Diagnosis not present

## 2014-06-05 DIAGNOSIS — Z23 Encounter for immunization: Secondary | ICD-10-CM | POA: Diagnosis not present

## 2014-06-05 DIAGNOSIS — D509 Iron deficiency anemia, unspecified: Secondary | ICD-10-CM | POA: Diagnosis not present

## 2014-06-05 DIAGNOSIS — Z992 Dependence on renal dialysis: Secondary | ICD-10-CM | POA: Diagnosis not present

## 2014-06-05 DIAGNOSIS — N2581 Secondary hyperparathyroidism of renal origin: Secondary | ICD-10-CM | POA: Diagnosis not present

## 2014-06-05 DIAGNOSIS — D631 Anemia in chronic kidney disease: Secondary | ICD-10-CM | POA: Diagnosis not present

## 2014-06-07 ENCOUNTER — Ambulatory Visit: Payer: Medicare Other | Admitting: Vascular Surgery

## 2014-06-07 ENCOUNTER — Other Ambulatory Visit (HOSPITAL_COMMUNITY): Payer: Medicare Other

## 2014-06-07 DIAGNOSIS — D509 Iron deficiency anemia, unspecified: Secondary | ICD-10-CM | POA: Diagnosis not present

## 2014-06-07 DIAGNOSIS — Z23 Encounter for immunization: Secondary | ICD-10-CM | POA: Diagnosis not present

## 2014-06-07 DIAGNOSIS — D631 Anemia in chronic kidney disease: Secondary | ICD-10-CM | POA: Diagnosis not present

## 2014-06-07 DIAGNOSIS — N186 End stage renal disease: Secondary | ICD-10-CM | POA: Diagnosis not present

## 2014-06-07 DIAGNOSIS — N2581 Secondary hyperparathyroidism of renal origin: Secondary | ICD-10-CM | POA: Diagnosis not present

## 2014-06-07 DIAGNOSIS — Z992 Dependence on renal dialysis: Secondary | ICD-10-CM | POA: Diagnosis not present

## 2014-06-09 DIAGNOSIS — N186 End stage renal disease: Secondary | ICD-10-CM | POA: Diagnosis not present

## 2014-06-09 DIAGNOSIS — Z992 Dependence on renal dialysis: Secondary | ICD-10-CM | POA: Diagnosis not present

## 2014-06-10 DIAGNOSIS — Z23 Encounter for immunization: Secondary | ICD-10-CM | POA: Diagnosis not present

## 2014-06-10 DIAGNOSIS — N186 End stage renal disease: Secondary | ICD-10-CM | POA: Diagnosis not present

## 2014-06-10 DIAGNOSIS — N2581 Secondary hyperparathyroidism of renal origin: Secondary | ICD-10-CM | POA: Diagnosis not present

## 2014-06-10 DIAGNOSIS — Z992 Dependence on renal dialysis: Secondary | ICD-10-CM | POA: Diagnosis not present

## 2014-06-10 DIAGNOSIS — D509 Iron deficiency anemia, unspecified: Secondary | ICD-10-CM | POA: Diagnosis not present

## 2014-06-10 DIAGNOSIS — D631 Anemia in chronic kidney disease: Secondary | ICD-10-CM | POA: Diagnosis not present

## 2014-06-12 ENCOUNTER — Encounter: Payer: Self-pay | Admitting: Vascular Surgery

## 2014-06-12 DIAGNOSIS — N2581 Secondary hyperparathyroidism of renal origin: Secondary | ICD-10-CM | POA: Diagnosis not present

## 2014-06-12 DIAGNOSIS — D631 Anemia in chronic kidney disease: Secondary | ICD-10-CM | POA: Diagnosis not present

## 2014-06-12 DIAGNOSIS — N186 End stage renal disease: Secondary | ICD-10-CM | POA: Diagnosis not present

## 2014-06-12 DIAGNOSIS — Z23 Encounter for immunization: Secondary | ICD-10-CM | POA: Diagnosis not present

## 2014-06-12 DIAGNOSIS — Z992 Dependence on renal dialysis: Secondary | ICD-10-CM | POA: Diagnosis not present

## 2014-06-12 DIAGNOSIS — D509 Iron deficiency anemia, unspecified: Secondary | ICD-10-CM | POA: Diagnosis not present

## 2014-06-14 ENCOUNTER — Encounter: Payer: Self-pay | Admitting: Vascular Surgery

## 2014-06-14 ENCOUNTER — Ambulatory Visit (HOSPITAL_COMMUNITY)
Admission: RE | Admit: 2014-06-14 | Discharge: 2014-06-14 | Disposition: A | Discharge status: Home / Self Care | Payer: Medicare Other | Source: Ambulatory Visit | Attending: Vascular Surgery | Admitting: Vascular Surgery

## 2014-06-14 ENCOUNTER — Ambulatory Visit (INDEPENDENT_AMBULATORY_CARE_PROVIDER_SITE_OTHER): Payer: Medicare Other | Admitting: Vascular Surgery

## 2014-06-14 VITALS — BP 126/65 | HR 90 | Ht 67.0 in | Wt 167.5 lb

## 2014-06-14 DIAGNOSIS — D509 Iron deficiency anemia, unspecified: Secondary | ICD-10-CM | POA: Diagnosis not present

## 2014-06-14 DIAGNOSIS — N186 End stage renal disease: Secondary | ICD-10-CM

## 2014-06-14 DIAGNOSIS — Z4931 Encounter for adequacy testing for hemodialysis: Secondary | ICD-10-CM

## 2014-06-14 DIAGNOSIS — Z992 Dependence on renal dialysis: Secondary | ICD-10-CM | POA: Diagnosis not present

## 2014-06-14 DIAGNOSIS — N2581 Secondary hyperparathyroidism of renal origin: Secondary | ICD-10-CM | POA: Diagnosis not present

## 2014-06-14 DIAGNOSIS — Z23 Encounter for immunization: Secondary | ICD-10-CM | POA: Diagnosis not present

## 2014-06-14 DIAGNOSIS — D631 Anemia in chronic kidney disease: Secondary | ICD-10-CM | POA: Diagnosis not present

## 2014-06-14 NOTE — Progress Notes (Signed)
    Established Dialysis Access  History of Present Illness  [name] is a 81 y.o. (16-Oct-1930) male who presents for re-evaluation of his left basilic fistula.  He has been on HD since June 2015.  He states they are not having any trouble sticking the fistula at this time.  He has no signs of steal.    The patient's PMH, PSH, SH, FamHx, Med, and Allergies are unchanged from 05/09/2014 On ROS today: no fever, chills, N/V/D, no numbness or tingling in his arms or hands.  Physical Examination  Filed Vitals:   06/14/14 1416  BP: 126/65  Pulse: 90  Height: 5\' 7"  (1.702 m)  Weight: 167 lb 8 oz (75.978 kg)  SpO2: 100%   Body mass index is 26.23 kg/(m^2).  General: A&O x 3, WD  Pulmonary: Sym exp, good air movt, CTAB, no rales, rhonchi, & wheezing  Cardiac: RRR, Nl S1, S2, no Murmurs, rubs or gallops  Vascular: Vessel Right Left  Radial Palpable Palpable  Ulnar Palpable Palpable  Brachial Palpable Palpable    Musculoskeletal: M/S 5/5 throughout   Neurologic: Pain and light touch intact in extremities  Non-Invasive Vascular Imaging  left arm Access Duplex  (Date: 06/14/2014):   Diameters:  1.1-0.74 cm  Depth:  0.64-0.74 cm     [name] is a 81 y.o. male who presents for follow up Left BC fistula ESRD requiring hemodialysis.  At this time he is having no difficulties with dialysis.  The fistula is slightly deeper than we want, but they seem to be able to access it well.  He will f/u as needed in the future.  He was seen today in conjunction with Dr. Cloyde Reams, Helio Lack [facility] PA-C [facility] [place] Office: [phone]   06/14/2014, 2:33 PM  Addendum  I have independently interviewed and examined the patient, and I agree with the physician assistant's findings.  Access duplex demonstrates some segments of the fistula are >6 mm but reportedly there have been no further cannulation issues, so I don't think further superficialization  is necessary.  Available as needed.  Adele Barthel, MD [facility] [place] Office: [phone] Pager: [phone]  06/14/2014, 2:48 PM

## 2014-06-17 DIAGNOSIS — N2581 Secondary hyperparathyroidism of renal origin: Secondary | ICD-10-CM | POA: Diagnosis not present

## 2014-06-17 DIAGNOSIS — Z23 Encounter for immunization: Secondary | ICD-10-CM | POA: Diagnosis not present

## 2014-06-17 DIAGNOSIS — D631 Anemia in chronic kidney disease: Secondary | ICD-10-CM | POA: Diagnosis not present

## 2014-06-17 DIAGNOSIS — N186 End stage renal disease: Secondary | ICD-10-CM | POA: Diagnosis not present

## 2014-06-17 DIAGNOSIS — Z992 Dependence on renal dialysis: Secondary | ICD-10-CM | POA: Diagnosis not present

## 2014-06-17 DIAGNOSIS — D509 Iron deficiency anemia, unspecified: Secondary | ICD-10-CM | POA: Diagnosis not present

## 2014-06-19 DIAGNOSIS — N186 End stage renal disease: Secondary | ICD-10-CM | POA: Diagnosis not present

## 2014-06-19 DIAGNOSIS — D509 Iron deficiency anemia, unspecified: Secondary | ICD-10-CM | POA: Diagnosis not present

## 2014-06-19 DIAGNOSIS — Z992 Dependence on renal dialysis: Secondary | ICD-10-CM | POA: Diagnosis not present

## 2014-06-19 DIAGNOSIS — Z23 Encounter for immunization: Secondary | ICD-10-CM | POA: Diagnosis not present

## 2014-06-19 DIAGNOSIS — N2581 Secondary hyperparathyroidism of renal origin: Secondary | ICD-10-CM | POA: Diagnosis not present

## 2014-06-19 DIAGNOSIS — D631 Anemia in chronic kidney disease: Secondary | ICD-10-CM | POA: Diagnosis not present

## 2014-06-21 DIAGNOSIS — D509 Iron deficiency anemia, unspecified: Secondary | ICD-10-CM | POA: Diagnosis not present

## 2014-06-21 DIAGNOSIS — N2581 Secondary hyperparathyroidism of renal origin: Secondary | ICD-10-CM | POA: Diagnosis not present

## 2014-06-21 DIAGNOSIS — D631 Anemia in chronic kidney disease: Secondary | ICD-10-CM | POA: Diagnosis not present

## 2014-06-21 DIAGNOSIS — N186 End stage renal disease: Secondary | ICD-10-CM | POA: Diagnosis not present

## 2014-06-21 DIAGNOSIS — Z992 Dependence on renal dialysis: Secondary | ICD-10-CM | POA: Diagnosis not present

## 2014-06-21 DIAGNOSIS — Z23 Encounter for immunization: Secondary | ICD-10-CM | POA: Diagnosis not present

## 2014-06-24 DIAGNOSIS — Z23 Encounter for immunization: Secondary | ICD-10-CM | POA: Diagnosis not present

## 2014-06-24 DIAGNOSIS — N186 End stage renal disease: Secondary | ICD-10-CM | POA: Diagnosis not present

## 2014-06-24 DIAGNOSIS — D631 Anemia in chronic kidney disease: Secondary | ICD-10-CM | POA: Diagnosis not present

## 2014-06-24 DIAGNOSIS — Z992 Dependence on renal dialysis: Secondary | ICD-10-CM | POA: Diagnosis not present

## 2014-06-24 DIAGNOSIS — D509 Iron deficiency anemia, unspecified: Secondary | ICD-10-CM | POA: Diagnosis not present

## 2014-06-24 DIAGNOSIS — N2581 Secondary hyperparathyroidism of renal origin: Secondary | ICD-10-CM | POA: Diagnosis not present

## 2014-06-26 DIAGNOSIS — N186 End stage renal disease: Secondary | ICD-10-CM | POA: Diagnosis not present

## 2014-06-26 DIAGNOSIS — D509 Iron deficiency anemia, unspecified: Secondary | ICD-10-CM | POA: Diagnosis not present

## 2014-06-26 DIAGNOSIS — D631 Anemia in chronic kidney disease: Secondary | ICD-10-CM | POA: Diagnosis not present

## 2014-06-26 DIAGNOSIS — Z992 Dependence on renal dialysis: Secondary | ICD-10-CM | POA: Diagnosis not present

## 2014-06-26 DIAGNOSIS — N2581 Secondary hyperparathyroidism of renal origin: Secondary | ICD-10-CM | POA: Diagnosis not present

## 2014-06-26 DIAGNOSIS — Z23 Encounter for immunization: Secondary | ICD-10-CM | POA: Diagnosis not present

## 2014-06-28 DIAGNOSIS — D631 Anemia in chronic kidney disease: Secondary | ICD-10-CM | POA: Diagnosis not present

## 2014-06-28 DIAGNOSIS — Z992 Dependence on renal dialysis: Secondary | ICD-10-CM | POA: Diagnosis not present

## 2014-06-28 DIAGNOSIS — D509 Iron deficiency anemia, unspecified: Secondary | ICD-10-CM | POA: Diagnosis not present

## 2014-06-28 DIAGNOSIS — N186 End stage renal disease: Secondary | ICD-10-CM | POA: Diagnosis not present

## 2014-06-28 DIAGNOSIS — Z23 Encounter for immunization: Secondary | ICD-10-CM | POA: Diagnosis not present

## 2014-06-28 DIAGNOSIS — N2581 Secondary hyperparathyroidism of renal origin: Secondary | ICD-10-CM | POA: Diagnosis not present

## 2014-07-01 DIAGNOSIS — Z992 Dependence on renal dialysis: Secondary | ICD-10-CM | POA: Diagnosis not present

## 2014-07-01 DIAGNOSIS — D509 Iron deficiency anemia, unspecified: Secondary | ICD-10-CM | POA: Diagnosis not present

## 2014-07-01 DIAGNOSIS — D631 Anemia in chronic kidney disease: Secondary | ICD-10-CM | POA: Diagnosis not present

## 2014-07-01 DIAGNOSIS — N2581 Secondary hyperparathyroidism of renal origin: Secondary | ICD-10-CM | POA: Diagnosis not present

## 2014-07-01 DIAGNOSIS — Z23 Encounter for immunization: Secondary | ICD-10-CM | POA: Diagnosis not present

## 2014-07-01 DIAGNOSIS — N186 End stage renal disease: Secondary | ICD-10-CM | POA: Diagnosis not present

## 2014-07-03 DIAGNOSIS — N186 End stage renal disease: Secondary | ICD-10-CM | POA: Diagnosis not present

## 2014-07-03 DIAGNOSIS — N2581 Secondary hyperparathyroidism of renal origin: Secondary | ICD-10-CM | POA: Diagnosis not present

## 2014-07-03 DIAGNOSIS — D509 Iron deficiency anemia, unspecified: Secondary | ICD-10-CM | POA: Diagnosis not present

## 2014-07-03 DIAGNOSIS — Z23 Encounter for immunization: Secondary | ICD-10-CM | POA: Diagnosis not present

## 2014-07-03 DIAGNOSIS — D631 Anemia in chronic kidney disease: Secondary | ICD-10-CM | POA: Diagnosis not present

## 2014-07-03 DIAGNOSIS — Z992 Dependence on renal dialysis: Secondary | ICD-10-CM | POA: Diagnosis not present

## 2014-07-05 DIAGNOSIS — D509 Iron deficiency anemia, unspecified: Secondary | ICD-10-CM | POA: Diagnosis not present

## 2014-07-05 DIAGNOSIS — D631 Anemia in chronic kidney disease: Secondary | ICD-10-CM | POA: Diagnosis not present

## 2014-07-05 DIAGNOSIS — Z992 Dependence on renal dialysis: Secondary | ICD-10-CM | POA: Diagnosis not present

## 2014-07-05 DIAGNOSIS — N186 End stage renal disease: Secondary | ICD-10-CM | POA: Diagnosis not present

## 2014-07-05 DIAGNOSIS — N2581 Secondary hyperparathyroidism of renal origin: Secondary | ICD-10-CM | POA: Diagnosis not present

## 2014-07-05 DIAGNOSIS — Z23 Encounter for immunization: Secondary | ICD-10-CM | POA: Diagnosis not present

## 2014-07-08 DIAGNOSIS — N2581 Secondary hyperparathyroidism of renal origin: Secondary | ICD-10-CM | POA: Diagnosis not present

## 2014-07-08 DIAGNOSIS — D509 Iron deficiency anemia, unspecified: Secondary | ICD-10-CM | POA: Diagnosis not present

## 2014-07-08 DIAGNOSIS — Z23 Encounter for immunization: Secondary | ICD-10-CM | POA: Diagnosis not present

## 2014-07-08 DIAGNOSIS — N186 End stage renal disease: Secondary | ICD-10-CM | POA: Diagnosis not present

## 2014-07-08 DIAGNOSIS — Z992 Dependence on renal dialysis: Secondary | ICD-10-CM | POA: Diagnosis not present

## 2014-07-08 DIAGNOSIS — D631 Anemia in chronic kidney disease: Secondary | ICD-10-CM | POA: Diagnosis not present

## 2014-07-10 DIAGNOSIS — Z23 Encounter for immunization: Secondary | ICD-10-CM | POA: Diagnosis not present

## 2014-07-10 DIAGNOSIS — N186 End stage renal disease: Secondary | ICD-10-CM | POA: Diagnosis not present

## 2014-07-10 DIAGNOSIS — D509 Iron deficiency anemia, unspecified: Secondary | ICD-10-CM | POA: Diagnosis not present

## 2014-07-10 DIAGNOSIS — Z992 Dependence on renal dialysis: Secondary | ICD-10-CM | POA: Diagnosis not present

## 2014-07-10 DIAGNOSIS — N2581 Secondary hyperparathyroidism of renal origin: Secondary | ICD-10-CM | POA: Diagnosis not present

## 2014-07-12 DIAGNOSIS — N186 End stage renal disease: Secondary | ICD-10-CM | POA: Diagnosis not present

## 2014-07-12 DIAGNOSIS — N2581 Secondary hyperparathyroidism of renal origin: Secondary | ICD-10-CM | POA: Diagnosis not present

## 2014-07-12 DIAGNOSIS — D509 Iron deficiency anemia, unspecified: Secondary | ICD-10-CM | POA: Diagnosis not present

## 2014-07-12 DIAGNOSIS — Z23 Encounter for immunization: Secondary | ICD-10-CM | POA: Diagnosis not present

## 2014-07-12 DIAGNOSIS — Z992 Dependence on renal dialysis: Secondary | ICD-10-CM | POA: Diagnosis not present

## 2014-07-15 DIAGNOSIS — Z23 Encounter for immunization: Secondary | ICD-10-CM | POA: Diagnosis not present

## 2014-07-15 DIAGNOSIS — N186 End stage renal disease: Secondary | ICD-10-CM | POA: Diagnosis not present

## 2014-07-15 DIAGNOSIS — Z992 Dependence on renal dialysis: Secondary | ICD-10-CM | POA: Diagnosis not present

## 2014-07-15 DIAGNOSIS — H10502 Unspecified blepharoconjunctivitis, left eye: Secondary | ICD-10-CM | POA: Diagnosis not present

## 2014-07-15 DIAGNOSIS — D509 Iron deficiency anemia, unspecified: Secondary | ICD-10-CM | POA: Diagnosis not present

## 2014-07-15 DIAGNOSIS — N2581 Secondary hyperparathyroidism of renal origin: Secondary | ICD-10-CM | POA: Diagnosis not present

## 2014-07-17 DIAGNOSIS — Z23 Encounter for immunization: Secondary | ICD-10-CM | POA: Diagnosis not present

## 2014-07-17 DIAGNOSIS — N2581 Secondary hyperparathyroidism of renal origin: Secondary | ICD-10-CM | POA: Diagnosis not present

## 2014-07-17 DIAGNOSIS — N186 End stage renal disease: Secondary | ICD-10-CM | POA: Diagnosis not present

## 2014-07-17 DIAGNOSIS — D509 Iron deficiency anemia, unspecified: Secondary | ICD-10-CM | POA: Diagnosis not present

## 2014-07-17 DIAGNOSIS — H10502 Unspecified blepharoconjunctivitis, left eye: Secondary | ICD-10-CM | POA: Diagnosis not present

## 2014-07-17 DIAGNOSIS — Z992 Dependence on renal dialysis: Secondary | ICD-10-CM | POA: Diagnosis not present

## 2014-07-18 DIAGNOSIS — H11822 Conjunctivochalasis, left eye: Secondary | ICD-10-CM | POA: Diagnosis not present

## 2014-07-18 DIAGNOSIS — B301 Conjunctivitis due to adenovirus: Secondary | ICD-10-CM | POA: Diagnosis not present

## 2014-07-19 DIAGNOSIS — N186 End stage renal disease: Secondary | ICD-10-CM | POA: Diagnosis not present

## 2014-07-19 DIAGNOSIS — D509 Iron deficiency anemia, unspecified: Secondary | ICD-10-CM | POA: Diagnosis not present

## 2014-07-19 DIAGNOSIS — Z23 Encounter for immunization: Secondary | ICD-10-CM | POA: Diagnosis not present

## 2014-07-19 DIAGNOSIS — N2581 Secondary hyperparathyroidism of renal origin: Secondary | ICD-10-CM | POA: Diagnosis not present

## 2014-07-19 DIAGNOSIS — Z992 Dependence on renal dialysis: Secondary | ICD-10-CM | POA: Diagnosis not present

## 2014-07-22 DIAGNOSIS — N186 End stage renal disease: Secondary | ICD-10-CM | POA: Diagnosis not present

## 2014-07-22 DIAGNOSIS — Z992 Dependence on renal dialysis: Secondary | ICD-10-CM | POA: Diagnosis not present

## 2014-07-22 DIAGNOSIS — D509 Iron deficiency anemia, unspecified: Secondary | ICD-10-CM | POA: Diagnosis not present

## 2014-07-22 DIAGNOSIS — Z23 Encounter for immunization: Secondary | ICD-10-CM | POA: Diagnosis not present

## 2014-07-22 DIAGNOSIS — N2581 Secondary hyperparathyroidism of renal origin: Secondary | ICD-10-CM | POA: Diagnosis not present

## 2014-07-24 DIAGNOSIS — D509 Iron deficiency anemia, unspecified: Secondary | ICD-10-CM | POA: Diagnosis not present

## 2014-07-24 DIAGNOSIS — Z992 Dependence on renal dialysis: Secondary | ICD-10-CM | POA: Diagnosis not present

## 2014-07-24 DIAGNOSIS — Z23 Encounter for immunization: Secondary | ICD-10-CM | POA: Diagnosis not present

## 2014-07-24 DIAGNOSIS — N2581 Secondary hyperparathyroidism of renal origin: Secondary | ICD-10-CM | POA: Diagnosis not present

## 2014-07-24 DIAGNOSIS — N186 End stage renal disease: Secondary | ICD-10-CM | POA: Diagnosis not present

## 2014-07-26 DIAGNOSIS — Z23 Encounter for immunization: Secondary | ICD-10-CM | POA: Diagnosis not present

## 2014-07-26 DIAGNOSIS — Z992 Dependence on renal dialysis: Secondary | ICD-10-CM | POA: Diagnosis not present

## 2014-07-26 DIAGNOSIS — N186 End stage renal disease: Secondary | ICD-10-CM | POA: Diagnosis not present

## 2014-07-26 DIAGNOSIS — D509 Iron deficiency anemia, unspecified: Secondary | ICD-10-CM | POA: Diagnosis not present

## 2014-07-26 DIAGNOSIS — N2581 Secondary hyperparathyroidism of renal origin: Secondary | ICD-10-CM | POA: Diagnosis not present

## 2014-07-29 DIAGNOSIS — D509 Iron deficiency anemia, unspecified: Secondary | ICD-10-CM | POA: Diagnosis not present

## 2014-07-29 DIAGNOSIS — N2581 Secondary hyperparathyroidism of renal origin: Secondary | ICD-10-CM | POA: Diagnosis not present

## 2014-07-29 DIAGNOSIS — Z23 Encounter for immunization: Secondary | ICD-10-CM | POA: Diagnosis not present

## 2014-07-29 DIAGNOSIS — Z992 Dependence on renal dialysis: Secondary | ICD-10-CM | POA: Diagnosis not present

## 2014-07-29 DIAGNOSIS — N186 End stage renal disease: Secondary | ICD-10-CM | POA: Diagnosis not present

## 2014-07-31 DIAGNOSIS — N2581 Secondary hyperparathyroidism of renal origin: Secondary | ICD-10-CM | POA: Diagnosis not present

## 2014-07-31 DIAGNOSIS — N186 End stage renal disease: Secondary | ICD-10-CM | POA: Diagnosis not present

## 2014-07-31 DIAGNOSIS — Z992 Dependence on renal dialysis: Secondary | ICD-10-CM | POA: Diagnosis not present

## 2014-07-31 DIAGNOSIS — Z23 Encounter for immunization: Secondary | ICD-10-CM | POA: Diagnosis not present

## 2014-07-31 DIAGNOSIS — D509 Iron deficiency anemia, unspecified: Secondary | ICD-10-CM | POA: Diagnosis not present

## 2014-08-01 DIAGNOSIS — Z713 Dietary counseling and surveillance: Secondary | ICD-10-CM | POA: Diagnosis not present

## 2014-08-01 DIAGNOSIS — I10 Essential (primary) hypertension: Secondary | ICD-10-CM | POA: Diagnosis not present

## 2014-08-01 DIAGNOSIS — Z6825 Body mass index (BMI) 25.0-25.9, adult: Secondary | ICD-10-CM | POA: Diagnosis not present

## 2014-08-01 DIAGNOSIS — Z992 Dependence on renal dialysis: Secondary | ICD-10-CM | POA: Diagnosis not present

## 2014-08-01 DIAGNOSIS — E663 Overweight: Secondary | ICD-10-CM | POA: Diagnosis not present

## 2014-08-02 DIAGNOSIS — Z992 Dependence on renal dialysis: Secondary | ICD-10-CM | POA: Diagnosis not present

## 2014-08-02 DIAGNOSIS — Z23 Encounter for immunization: Secondary | ICD-10-CM | POA: Diagnosis not present

## 2014-08-02 DIAGNOSIS — N186 End stage renal disease: Secondary | ICD-10-CM | POA: Diagnosis not present

## 2014-08-02 DIAGNOSIS — N2581 Secondary hyperparathyroidism of renal origin: Secondary | ICD-10-CM | POA: Diagnosis not present

## 2014-08-02 DIAGNOSIS — D509 Iron deficiency anemia, unspecified: Secondary | ICD-10-CM | POA: Diagnosis not present

## 2014-08-05 DIAGNOSIS — D509 Iron deficiency anemia, unspecified: Secondary | ICD-10-CM | POA: Diagnosis not present

## 2014-08-05 DIAGNOSIS — N2581 Secondary hyperparathyroidism of renal origin: Secondary | ICD-10-CM | POA: Diagnosis not present

## 2014-08-05 DIAGNOSIS — Z23 Encounter for immunization: Secondary | ICD-10-CM | POA: Diagnosis not present

## 2014-08-05 DIAGNOSIS — N186 End stage renal disease: Secondary | ICD-10-CM | POA: Diagnosis not present

## 2014-08-05 DIAGNOSIS — Z992 Dependence on renal dialysis: Secondary | ICD-10-CM | POA: Diagnosis not present

## 2014-08-07 DIAGNOSIS — D509 Iron deficiency anemia, unspecified: Secondary | ICD-10-CM | POA: Diagnosis not present

## 2014-08-07 DIAGNOSIS — N2581 Secondary hyperparathyroidism of renal origin: Secondary | ICD-10-CM | POA: Diagnosis not present

## 2014-08-07 DIAGNOSIS — Z992 Dependence on renal dialysis: Secondary | ICD-10-CM | POA: Diagnosis not present

## 2014-08-07 DIAGNOSIS — Z23 Encounter for immunization: Secondary | ICD-10-CM | POA: Diagnosis not present

## 2014-08-07 DIAGNOSIS — N186 End stage renal disease: Secondary | ICD-10-CM | POA: Diagnosis not present

## 2014-08-08 DIAGNOSIS — Z992 Dependence on renal dialysis: Secondary | ICD-10-CM | POA: Diagnosis not present

## 2014-08-08 DIAGNOSIS — N186 End stage renal disease: Secondary | ICD-10-CM | POA: Diagnosis not present

## 2014-08-09 DIAGNOSIS — Z23 Encounter for immunization: Secondary | ICD-10-CM | POA: Diagnosis not present

## 2014-08-09 DIAGNOSIS — N186 End stage renal disease: Secondary | ICD-10-CM | POA: Diagnosis not present

## 2014-08-09 DIAGNOSIS — D509 Iron deficiency anemia, unspecified: Secondary | ICD-10-CM | POA: Diagnosis not present

## 2014-08-09 DIAGNOSIS — D631 Anemia in chronic kidney disease: Secondary | ICD-10-CM | POA: Diagnosis not present

## 2014-08-09 DIAGNOSIS — N2581 Secondary hyperparathyroidism of renal origin: Secondary | ICD-10-CM | POA: Diagnosis not present

## 2014-08-09 DIAGNOSIS — Z992 Dependence on renal dialysis: Secondary | ICD-10-CM | POA: Diagnosis not present

## 2014-08-12 DIAGNOSIS — N2581 Secondary hyperparathyroidism of renal origin: Secondary | ICD-10-CM | POA: Diagnosis not present

## 2014-08-12 DIAGNOSIS — Z992 Dependence on renal dialysis: Secondary | ICD-10-CM | POA: Diagnosis not present

## 2014-08-12 DIAGNOSIS — Z23 Encounter for immunization: Secondary | ICD-10-CM | POA: Diagnosis not present

## 2014-08-12 DIAGNOSIS — D509 Iron deficiency anemia, unspecified: Secondary | ICD-10-CM | POA: Diagnosis not present

## 2014-08-12 DIAGNOSIS — N186 End stage renal disease: Secondary | ICD-10-CM | POA: Diagnosis not present

## 2014-08-12 DIAGNOSIS — D631 Anemia in chronic kidney disease: Secondary | ICD-10-CM | POA: Diagnosis not present

## 2014-08-14 DIAGNOSIS — D631 Anemia in chronic kidney disease: Secondary | ICD-10-CM | POA: Diagnosis not present

## 2014-08-14 DIAGNOSIS — N186 End stage renal disease: Secondary | ICD-10-CM | POA: Diagnosis not present

## 2014-08-14 DIAGNOSIS — Z23 Encounter for immunization: Secondary | ICD-10-CM | POA: Diagnosis not present

## 2014-08-14 DIAGNOSIS — N2581 Secondary hyperparathyroidism of renal origin: Secondary | ICD-10-CM | POA: Diagnosis not present

## 2014-08-14 DIAGNOSIS — D509 Iron deficiency anemia, unspecified: Secondary | ICD-10-CM | POA: Diagnosis not present

## 2014-08-14 DIAGNOSIS — Z992 Dependence on renal dialysis: Secondary | ICD-10-CM | POA: Diagnosis not present

## 2014-08-15 DIAGNOSIS — D225 Melanocytic nevi of trunk: Secondary | ICD-10-CM | POA: Diagnosis not present

## 2014-08-15 DIAGNOSIS — L820 Inflamed seborrheic keratosis: Secondary | ICD-10-CM | POA: Diagnosis not present

## 2014-08-15 DIAGNOSIS — Z85828 Personal history of other malignant neoplasm of skin: Secondary | ICD-10-CM | POA: Diagnosis not present

## 2014-08-15 DIAGNOSIS — Z08 Encounter for follow-up examination after completed treatment for malignant neoplasm: Secondary | ICD-10-CM | POA: Diagnosis not present

## 2014-08-15 DIAGNOSIS — B079 Viral wart, unspecified: Secondary | ICD-10-CM | POA: Diagnosis not present

## 2014-08-16 DIAGNOSIS — Z992 Dependence on renal dialysis: Secondary | ICD-10-CM | POA: Diagnosis not present

## 2014-08-16 DIAGNOSIS — N186 End stage renal disease: Secondary | ICD-10-CM | POA: Diagnosis not present

## 2014-08-16 DIAGNOSIS — N2581 Secondary hyperparathyroidism of renal origin: Secondary | ICD-10-CM | POA: Diagnosis not present

## 2014-08-16 DIAGNOSIS — Z23 Encounter for immunization: Secondary | ICD-10-CM | POA: Diagnosis not present

## 2014-08-16 DIAGNOSIS — D509 Iron deficiency anemia, unspecified: Secondary | ICD-10-CM | POA: Diagnosis not present

## 2014-08-16 DIAGNOSIS — D631 Anemia in chronic kidney disease: Secondary | ICD-10-CM | POA: Diagnosis not present

## 2014-08-19 DIAGNOSIS — D631 Anemia in chronic kidney disease: Secondary | ICD-10-CM | POA: Diagnosis not present

## 2014-08-19 DIAGNOSIS — Z23 Encounter for immunization: Secondary | ICD-10-CM | POA: Diagnosis not present

## 2014-08-19 DIAGNOSIS — N186 End stage renal disease: Secondary | ICD-10-CM | POA: Diagnosis not present

## 2014-08-19 DIAGNOSIS — D509 Iron deficiency anemia, unspecified: Secondary | ICD-10-CM | POA: Diagnosis not present

## 2014-08-19 DIAGNOSIS — N2581 Secondary hyperparathyroidism of renal origin: Secondary | ICD-10-CM | POA: Diagnosis not present

## 2014-08-19 DIAGNOSIS — Z992 Dependence on renal dialysis: Secondary | ICD-10-CM | POA: Diagnosis not present

## 2014-08-21 DIAGNOSIS — D509 Iron deficiency anemia, unspecified: Secondary | ICD-10-CM | POA: Diagnosis not present

## 2014-08-21 DIAGNOSIS — D631 Anemia in chronic kidney disease: Secondary | ICD-10-CM | POA: Diagnosis not present

## 2014-08-21 DIAGNOSIS — N186 End stage renal disease: Secondary | ICD-10-CM | POA: Diagnosis not present

## 2014-08-21 DIAGNOSIS — N2581 Secondary hyperparathyroidism of renal origin: Secondary | ICD-10-CM | POA: Diagnosis not present

## 2014-08-21 DIAGNOSIS — Z992 Dependence on renal dialysis: Secondary | ICD-10-CM | POA: Diagnosis not present

## 2014-08-21 DIAGNOSIS — Z23 Encounter for immunization: Secondary | ICD-10-CM | POA: Diagnosis not present

## 2014-08-23 DIAGNOSIS — N2581 Secondary hyperparathyroidism of renal origin: Secondary | ICD-10-CM | POA: Diagnosis not present

## 2014-08-23 DIAGNOSIS — D631 Anemia in chronic kidney disease: Secondary | ICD-10-CM | POA: Diagnosis not present

## 2014-08-23 DIAGNOSIS — D509 Iron deficiency anemia, unspecified: Secondary | ICD-10-CM | POA: Diagnosis not present

## 2014-08-23 DIAGNOSIS — Z992 Dependence on renal dialysis: Secondary | ICD-10-CM | POA: Diagnosis not present

## 2014-08-23 DIAGNOSIS — Z23 Encounter for immunization: Secondary | ICD-10-CM | POA: Diagnosis not present

## 2014-08-23 DIAGNOSIS — N186 End stage renal disease: Secondary | ICD-10-CM | POA: Diagnosis not present

## 2014-08-26 DIAGNOSIS — Z992 Dependence on renal dialysis: Secondary | ICD-10-CM | POA: Diagnosis not present

## 2014-08-26 DIAGNOSIS — D631 Anemia in chronic kidney disease: Secondary | ICD-10-CM | POA: Diagnosis not present

## 2014-08-26 DIAGNOSIS — N186 End stage renal disease: Secondary | ICD-10-CM | POA: Diagnosis not present

## 2014-08-26 DIAGNOSIS — Z23 Encounter for immunization: Secondary | ICD-10-CM | POA: Diagnosis not present

## 2014-08-26 DIAGNOSIS — D509 Iron deficiency anemia, unspecified: Secondary | ICD-10-CM | POA: Diagnosis not present

## 2014-08-26 DIAGNOSIS — N2581 Secondary hyperparathyroidism of renal origin: Secondary | ICD-10-CM | POA: Diagnosis not present

## 2014-08-28 DIAGNOSIS — D631 Anemia in chronic kidney disease: Secondary | ICD-10-CM | POA: Diagnosis not present

## 2014-08-28 DIAGNOSIS — Z23 Encounter for immunization: Secondary | ICD-10-CM | POA: Diagnosis not present

## 2014-08-28 DIAGNOSIS — N2581 Secondary hyperparathyroidism of renal origin: Secondary | ICD-10-CM | POA: Diagnosis not present

## 2014-08-28 DIAGNOSIS — N186 End stage renal disease: Secondary | ICD-10-CM | POA: Diagnosis not present

## 2014-08-28 DIAGNOSIS — D509 Iron deficiency anemia, unspecified: Secondary | ICD-10-CM | POA: Diagnosis not present

## 2014-08-28 DIAGNOSIS — Z992 Dependence on renal dialysis: Secondary | ICD-10-CM | POA: Diagnosis not present

## 2014-08-30 DIAGNOSIS — D509 Iron deficiency anemia, unspecified: Secondary | ICD-10-CM | POA: Diagnosis not present

## 2014-08-30 DIAGNOSIS — N2581 Secondary hyperparathyroidism of renal origin: Secondary | ICD-10-CM | POA: Diagnosis not present

## 2014-08-30 DIAGNOSIS — N186 End stage renal disease: Secondary | ICD-10-CM | POA: Diagnosis not present

## 2014-08-30 DIAGNOSIS — Z23 Encounter for immunization: Secondary | ICD-10-CM | POA: Diagnosis not present

## 2014-08-30 DIAGNOSIS — Z992 Dependence on renal dialysis: Secondary | ICD-10-CM | POA: Diagnosis not present

## 2014-08-30 DIAGNOSIS — D631 Anemia in chronic kidney disease: Secondary | ICD-10-CM | POA: Diagnosis not present

## 2014-09-02 DIAGNOSIS — N186 End stage renal disease: Secondary | ICD-10-CM | POA: Diagnosis not present

## 2014-09-02 DIAGNOSIS — Z23 Encounter for immunization: Secondary | ICD-10-CM | POA: Diagnosis not present

## 2014-09-02 DIAGNOSIS — D509 Iron deficiency anemia, unspecified: Secondary | ICD-10-CM | POA: Diagnosis not present

## 2014-09-02 DIAGNOSIS — Z992 Dependence on renal dialysis: Secondary | ICD-10-CM | POA: Diagnosis not present

## 2014-09-02 DIAGNOSIS — D631 Anemia in chronic kidney disease: Secondary | ICD-10-CM | POA: Diagnosis not present

## 2014-09-02 DIAGNOSIS — N2581 Secondary hyperparathyroidism of renal origin: Secondary | ICD-10-CM | POA: Diagnosis not present

## 2014-09-04 DIAGNOSIS — N2581 Secondary hyperparathyroidism of renal origin: Secondary | ICD-10-CM | POA: Diagnosis not present

## 2014-09-04 DIAGNOSIS — D631 Anemia in chronic kidney disease: Secondary | ICD-10-CM | POA: Diagnosis not present

## 2014-09-04 DIAGNOSIS — Z992 Dependence on renal dialysis: Secondary | ICD-10-CM | POA: Diagnosis not present

## 2014-09-04 DIAGNOSIS — D509 Iron deficiency anemia, unspecified: Secondary | ICD-10-CM | POA: Diagnosis not present

## 2014-09-04 DIAGNOSIS — N186 End stage renal disease: Secondary | ICD-10-CM | POA: Diagnosis not present

## 2014-09-04 DIAGNOSIS — Z23 Encounter for immunization: Secondary | ICD-10-CM | POA: Diagnosis not present

## 2014-09-06 DIAGNOSIS — N186 End stage renal disease: Secondary | ICD-10-CM | POA: Diagnosis not present

## 2014-09-06 DIAGNOSIS — Z992 Dependence on renal dialysis: Secondary | ICD-10-CM | POA: Diagnosis not present

## 2014-09-06 DIAGNOSIS — D509 Iron deficiency anemia, unspecified: Secondary | ICD-10-CM | POA: Diagnosis not present

## 2014-09-06 DIAGNOSIS — D631 Anemia in chronic kidney disease: Secondary | ICD-10-CM | POA: Diagnosis not present

## 2014-09-06 DIAGNOSIS — Z23 Encounter for immunization: Secondary | ICD-10-CM | POA: Diagnosis not present

## 2014-09-06 DIAGNOSIS — N2581 Secondary hyperparathyroidism of renal origin: Secondary | ICD-10-CM | POA: Diagnosis not present

## 2014-09-07 DIAGNOSIS — Z992 Dependence on renal dialysis: Secondary | ICD-10-CM | POA: Diagnosis not present

## 2014-09-07 DIAGNOSIS — N186 End stage renal disease: Secondary | ICD-10-CM | POA: Diagnosis not present

## 2014-09-09 DIAGNOSIS — N186 End stage renal disease: Secondary | ICD-10-CM | POA: Diagnosis not present

## 2014-09-09 DIAGNOSIS — N2581 Secondary hyperparathyroidism of renal origin: Secondary | ICD-10-CM | POA: Diagnosis not present

## 2014-09-09 DIAGNOSIS — D631 Anemia in chronic kidney disease: Secondary | ICD-10-CM | POA: Diagnosis not present

## 2014-09-09 DIAGNOSIS — D509 Iron deficiency anemia, unspecified: Secondary | ICD-10-CM | POA: Diagnosis not present

## 2014-09-09 DIAGNOSIS — Z992 Dependence on renal dialysis: Secondary | ICD-10-CM | POA: Diagnosis not present

## 2014-09-11 DIAGNOSIS — Z992 Dependence on renal dialysis: Secondary | ICD-10-CM | POA: Diagnosis not present

## 2014-09-11 DIAGNOSIS — N2581 Secondary hyperparathyroidism of renal origin: Secondary | ICD-10-CM | POA: Diagnosis not present

## 2014-09-11 DIAGNOSIS — D509 Iron deficiency anemia, unspecified: Secondary | ICD-10-CM | POA: Diagnosis not present

## 2014-09-11 DIAGNOSIS — N186 End stage renal disease: Secondary | ICD-10-CM | POA: Diagnosis not present

## 2014-09-11 DIAGNOSIS — D631 Anemia in chronic kidney disease: Secondary | ICD-10-CM | POA: Diagnosis not present

## 2014-09-13 DIAGNOSIS — D509 Iron deficiency anemia, unspecified: Secondary | ICD-10-CM | POA: Diagnosis not present

## 2014-09-13 DIAGNOSIS — Z992 Dependence on renal dialysis: Secondary | ICD-10-CM | POA: Diagnosis not present

## 2014-09-13 DIAGNOSIS — N186 End stage renal disease: Secondary | ICD-10-CM | POA: Diagnosis not present

## 2014-09-13 DIAGNOSIS — D631 Anemia in chronic kidney disease: Secondary | ICD-10-CM | POA: Diagnosis not present

## 2014-09-13 DIAGNOSIS — N2581 Secondary hyperparathyroidism of renal origin: Secondary | ICD-10-CM | POA: Diagnosis not present

## 2014-09-16 DIAGNOSIS — N2581 Secondary hyperparathyroidism of renal origin: Secondary | ICD-10-CM | POA: Diagnosis not present

## 2014-09-16 DIAGNOSIS — D631 Anemia in chronic kidney disease: Secondary | ICD-10-CM | POA: Diagnosis not present

## 2014-09-16 DIAGNOSIS — N186 End stage renal disease: Secondary | ICD-10-CM | POA: Diagnosis not present

## 2014-09-16 DIAGNOSIS — D509 Iron deficiency anemia, unspecified: Secondary | ICD-10-CM | POA: Diagnosis not present

## 2014-09-16 DIAGNOSIS — Z992 Dependence on renal dialysis: Secondary | ICD-10-CM | POA: Diagnosis not present

## 2014-09-18 DIAGNOSIS — N186 End stage renal disease: Secondary | ICD-10-CM | POA: Diagnosis not present

## 2014-09-18 DIAGNOSIS — N2581 Secondary hyperparathyroidism of renal origin: Secondary | ICD-10-CM | POA: Diagnosis not present

## 2014-09-18 DIAGNOSIS — D631 Anemia in chronic kidney disease: Secondary | ICD-10-CM | POA: Diagnosis not present

## 2014-09-18 DIAGNOSIS — D509 Iron deficiency anemia, unspecified: Secondary | ICD-10-CM | POA: Diagnosis not present

## 2014-09-18 DIAGNOSIS — Z992 Dependence on renal dialysis: Secondary | ICD-10-CM | POA: Diagnosis not present

## 2014-09-20 DIAGNOSIS — N186 End stage renal disease: Secondary | ICD-10-CM | POA: Diagnosis not present

## 2014-09-20 DIAGNOSIS — Z992 Dependence on renal dialysis: Secondary | ICD-10-CM | POA: Diagnosis not present

## 2014-09-20 DIAGNOSIS — N2581 Secondary hyperparathyroidism of renal origin: Secondary | ICD-10-CM | POA: Diagnosis not present

## 2014-09-20 DIAGNOSIS — D509 Iron deficiency anemia, unspecified: Secondary | ICD-10-CM | POA: Diagnosis not present

## 2014-09-20 DIAGNOSIS — D631 Anemia in chronic kidney disease: Secondary | ICD-10-CM | POA: Diagnosis not present

## 2014-09-23 DIAGNOSIS — N186 End stage renal disease: Secondary | ICD-10-CM | POA: Diagnosis not present

## 2014-09-23 DIAGNOSIS — Z992 Dependence on renal dialysis: Secondary | ICD-10-CM | POA: Diagnosis not present

## 2014-09-23 DIAGNOSIS — N2581 Secondary hyperparathyroidism of renal origin: Secondary | ICD-10-CM | POA: Diagnosis not present

## 2014-09-23 DIAGNOSIS — D509 Iron deficiency anemia, unspecified: Secondary | ICD-10-CM | POA: Diagnosis not present

## 2014-09-23 DIAGNOSIS — D631 Anemia in chronic kidney disease: Secondary | ICD-10-CM | POA: Diagnosis not present

## 2014-09-25 DIAGNOSIS — Z992 Dependence on renal dialysis: Secondary | ICD-10-CM | POA: Diagnosis not present

## 2014-09-25 DIAGNOSIS — D509 Iron deficiency anemia, unspecified: Secondary | ICD-10-CM | POA: Diagnosis not present

## 2014-09-25 DIAGNOSIS — N2581 Secondary hyperparathyroidism of renal origin: Secondary | ICD-10-CM | POA: Diagnosis not present

## 2014-09-25 DIAGNOSIS — D631 Anemia in chronic kidney disease: Secondary | ICD-10-CM | POA: Diagnosis not present

## 2014-09-25 DIAGNOSIS — N186 End stage renal disease: Secondary | ICD-10-CM | POA: Diagnosis not present

## 2014-09-27 DIAGNOSIS — N2581 Secondary hyperparathyroidism of renal origin: Secondary | ICD-10-CM | POA: Diagnosis not present

## 2014-09-27 DIAGNOSIS — N186 End stage renal disease: Secondary | ICD-10-CM | POA: Diagnosis not present

## 2014-09-27 DIAGNOSIS — D509 Iron deficiency anemia, unspecified: Secondary | ICD-10-CM | POA: Diagnosis not present

## 2014-09-27 DIAGNOSIS — D631 Anemia in chronic kidney disease: Secondary | ICD-10-CM | POA: Diagnosis not present

## 2014-09-27 DIAGNOSIS — Z992 Dependence on renal dialysis: Secondary | ICD-10-CM | POA: Diagnosis not present

## 2014-09-30 DIAGNOSIS — D509 Iron deficiency anemia, unspecified: Secondary | ICD-10-CM | POA: Diagnosis not present

## 2014-09-30 DIAGNOSIS — N2581 Secondary hyperparathyroidism of renal origin: Secondary | ICD-10-CM | POA: Diagnosis not present

## 2014-09-30 DIAGNOSIS — N186 End stage renal disease: Secondary | ICD-10-CM | POA: Diagnosis not present

## 2014-09-30 DIAGNOSIS — Z992 Dependence on renal dialysis: Secondary | ICD-10-CM | POA: Diagnosis not present

## 2014-09-30 DIAGNOSIS — D631 Anemia in chronic kidney disease: Secondary | ICD-10-CM | POA: Diagnosis not present

## 2014-10-02 DIAGNOSIS — N2581 Secondary hyperparathyroidism of renal origin: Secondary | ICD-10-CM | POA: Diagnosis not present

## 2014-10-02 DIAGNOSIS — D631 Anemia in chronic kidney disease: Secondary | ICD-10-CM | POA: Diagnosis not present

## 2014-10-02 DIAGNOSIS — N186 End stage renal disease: Secondary | ICD-10-CM | POA: Diagnosis not present

## 2014-10-02 DIAGNOSIS — Z992 Dependence on renal dialysis: Secondary | ICD-10-CM | POA: Diagnosis not present

## 2014-10-02 DIAGNOSIS — D509 Iron deficiency anemia, unspecified: Secondary | ICD-10-CM | POA: Diagnosis not present

## 2014-10-04 DIAGNOSIS — Z992 Dependence on renal dialysis: Secondary | ICD-10-CM | POA: Diagnosis not present

## 2014-10-04 DIAGNOSIS — D631 Anemia in chronic kidney disease: Secondary | ICD-10-CM | POA: Diagnosis not present

## 2014-10-04 DIAGNOSIS — N2581 Secondary hyperparathyroidism of renal origin: Secondary | ICD-10-CM | POA: Diagnosis not present

## 2014-10-04 DIAGNOSIS — N186 End stage renal disease: Secondary | ICD-10-CM | POA: Diagnosis not present

## 2014-10-04 DIAGNOSIS — D509 Iron deficiency anemia, unspecified: Secondary | ICD-10-CM | POA: Diagnosis not present

## 2014-10-07 DIAGNOSIS — Z992 Dependence on renal dialysis: Secondary | ICD-10-CM | POA: Diagnosis not present

## 2014-10-07 DIAGNOSIS — D631 Anemia in chronic kidney disease: Secondary | ICD-10-CM | POA: Diagnosis not present

## 2014-10-07 DIAGNOSIS — D509 Iron deficiency anemia, unspecified: Secondary | ICD-10-CM | POA: Diagnosis not present

## 2014-10-07 DIAGNOSIS — N186 End stage renal disease: Secondary | ICD-10-CM | POA: Diagnosis not present

## 2014-10-07 DIAGNOSIS — N2581 Secondary hyperparathyroidism of renal origin: Secondary | ICD-10-CM | POA: Diagnosis not present

## 2014-10-08 DIAGNOSIS — N186 End stage renal disease: Secondary | ICD-10-CM | POA: Diagnosis not present

## 2014-10-08 DIAGNOSIS — Z992 Dependence on renal dialysis: Secondary | ICD-10-CM | POA: Diagnosis not present

## 2014-10-09 DIAGNOSIS — N186 End stage renal disease: Secondary | ICD-10-CM | POA: Diagnosis not present

## 2014-10-09 DIAGNOSIS — Z992 Dependence on renal dialysis: Secondary | ICD-10-CM | POA: Diagnosis not present

## 2014-10-09 DIAGNOSIS — D509 Iron deficiency anemia, unspecified: Secondary | ICD-10-CM | POA: Diagnosis not present

## 2014-10-09 DIAGNOSIS — N2581 Secondary hyperparathyroidism of renal origin: Secondary | ICD-10-CM | POA: Diagnosis not present

## 2014-10-09 DIAGNOSIS — D631 Anemia in chronic kidney disease: Secondary | ICD-10-CM | POA: Diagnosis not present

## 2014-10-11 DIAGNOSIS — N186 End stage renal disease: Secondary | ICD-10-CM | POA: Diagnosis not present

## 2014-10-11 DIAGNOSIS — D631 Anemia in chronic kidney disease: Secondary | ICD-10-CM | POA: Diagnosis not present

## 2014-10-11 DIAGNOSIS — D509 Iron deficiency anemia, unspecified: Secondary | ICD-10-CM | POA: Diagnosis not present

## 2014-10-11 DIAGNOSIS — Z992 Dependence on renal dialysis: Secondary | ICD-10-CM | POA: Diagnosis not present

## 2014-10-11 DIAGNOSIS — N2581 Secondary hyperparathyroidism of renal origin: Secondary | ICD-10-CM | POA: Diagnosis not present

## 2014-10-14 DIAGNOSIS — Z992 Dependence on renal dialysis: Secondary | ICD-10-CM | POA: Diagnosis not present

## 2014-10-14 DIAGNOSIS — N2581 Secondary hyperparathyroidism of renal origin: Secondary | ICD-10-CM | POA: Diagnosis not present

## 2014-10-14 DIAGNOSIS — D631 Anemia in chronic kidney disease: Secondary | ICD-10-CM | POA: Diagnosis not present

## 2014-10-14 DIAGNOSIS — D509 Iron deficiency anemia, unspecified: Secondary | ICD-10-CM | POA: Diagnosis not present

## 2014-10-14 DIAGNOSIS — N186 End stage renal disease: Secondary | ICD-10-CM | POA: Diagnosis not present

## 2014-10-16 DIAGNOSIS — N2581 Secondary hyperparathyroidism of renal origin: Secondary | ICD-10-CM | POA: Diagnosis not present

## 2014-10-16 DIAGNOSIS — Z992 Dependence on renal dialysis: Secondary | ICD-10-CM | POA: Diagnosis not present

## 2014-10-16 DIAGNOSIS — D509 Iron deficiency anemia, unspecified: Secondary | ICD-10-CM | POA: Diagnosis not present

## 2014-10-16 DIAGNOSIS — D631 Anemia in chronic kidney disease: Secondary | ICD-10-CM | POA: Diagnosis not present

## 2014-10-16 DIAGNOSIS — N186 End stage renal disease: Secondary | ICD-10-CM | POA: Diagnosis not present

## 2014-10-18 DIAGNOSIS — D509 Iron deficiency anemia, unspecified: Secondary | ICD-10-CM | POA: Diagnosis not present

## 2014-10-18 DIAGNOSIS — D631 Anemia in chronic kidney disease: Secondary | ICD-10-CM | POA: Diagnosis not present

## 2014-10-18 DIAGNOSIS — Z992 Dependence on renal dialysis: Secondary | ICD-10-CM | POA: Diagnosis not present

## 2014-10-18 DIAGNOSIS — N2581 Secondary hyperparathyroidism of renal origin: Secondary | ICD-10-CM | POA: Diagnosis not present

## 2014-10-18 DIAGNOSIS — N186 End stage renal disease: Secondary | ICD-10-CM | POA: Diagnosis not present

## 2014-10-21 DIAGNOSIS — D509 Iron deficiency anemia, unspecified: Secondary | ICD-10-CM | POA: Diagnosis not present

## 2014-10-21 DIAGNOSIS — D631 Anemia in chronic kidney disease: Secondary | ICD-10-CM | POA: Diagnosis not present

## 2014-10-21 DIAGNOSIS — N186 End stage renal disease: Secondary | ICD-10-CM | POA: Diagnosis not present

## 2014-10-21 DIAGNOSIS — N2581 Secondary hyperparathyroidism of renal origin: Secondary | ICD-10-CM | POA: Diagnosis not present

## 2014-10-21 DIAGNOSIS — Z992 Dependence on renal dialysis: Secondary | ICD-10-CM | POA: Diagnosis not present

## 2014-10-22 ENCOUNTER — Other Ambulatory Visit (HOSPITAL_COMMUNITY): Payer: Self-pay

## 2014-10-22 DIAGNOSIS — C3492 Malignant neoplasm of unspecified part of left bronchus or lung: Secondary | ICD-10-CM

## 2014-10-23 DIAGNOSIS — D509 Iron deficiency anemia, unspecified: Secondary | ICD-10-CM | POA: Diagnosis not present

## 2014-10-23 DIAGNOSIS — N186 End stage renal disease: Secondary | ICD-10-CM | POA: Diagnosis not present

## 2014-10-23 DIAGNOSIS — D631 Anemia in chronic kidney disease: Secondary | ICD-10-CM | POA: Diagnosis not present

## 2014-10-23 DIAGNOSIS — Z992 Dependence on renal dialysis: Secondary | ICD-10-CM | POA: Diagnosis not present

## 2014-10-23 DIAGNOSIS — N2581 Secondary hyperparathyroidism of renal origin: Secondary | ICD-10-CM | POA: Diagnosis not present

## 2014-10-25 DIAGNOSIS — Z992 Dependence on renal dialysis: Secondary | ICD-10-CM | POA: Diagnosis not present

## 2014-10-25 DIAGNOSIS — D631 Anemia in chronic kidney disease: Secondary | ICD-10-CM | POA: Diagnosis not present

## 2014-10-25 DIAGNOSIS — N2581 Secondary hyperparathyroidism of renal origin: Secondary | ICD-10-CM | POA: Diagnosis not present

## 2014-10-25 DIAGNOSIS — N186 End stage renal disease: Secondary | ICD-10-CM | POA: Diagnosis not present

## 2014-10-25 DIAGNOSIS — D509 Iron deficiency anemia, unspecified: Secondary | ICD-10-CM | POA: Diagnosis not present

## 2014-10-28 DIAGNOSIS — N186 End stage renal disease: Secondary | ICD-10-CM | POA: Diagnosis not present

## 2014-10-28 DIAGNOSIS — Z992 Dependence on renal dialysis: Secondary | ICD-10-CM | POA: Diagnosis not present

## 2014-10-28 DIAGNOSIS — D631 Anemia in chronic kidney disease: Secondary | ICD-10-CM | POA: Diagnosis not present

## 2014-10-28 DIAGNOSIS — D509 Iron deficiency anemia, unspecified: Secondary | ICD-10-CM | POA: Diagnosis not present

## 2014-10-28 DIAGNOSIS — N2581 Secondary hyperparathyroidism of renal origin: Secondary | ICD-10-CM | POA: Diagnosis not present

## 2014-10-29 ENCOUNTER — Ambulatory Visit (HOSPITAL_COMMUNITY): Payer: Medicare Other

## 2014-10-29 ENCOUNTER — Other Ambulatory Visit (HOSPITAL_COMMUNITY): Payer: Medicare Other

## 2014-10-31 ENCOUNTER — Ambulatory Visit (HOSPITAL_COMMUNITY): Payer: Medicare Other | Admitting: Hematology & Oncology

## 2014-11-01 DIAGNOSIS — N2581 Secondary hyperparathyroidism of renal origin: Secondary | ICD-10-CM | POA: Diagnosis not present

## 2014-11-01 DIAGNOSIS — N186 End stage renal disease: Secondary | ICD-10-CM | POA: Diagnosis not present

## 2014-11-01 DIAGNOSIS — D631 Anemia in chronic kidney disease: Secondary | ICD-10-CM | POA: Diagnosis not present

## 2014-11-01 DIAGNOSIS — D509 Iron deficiency anemia, unspecified: Secondary | ICD-10-CM | POA: Diagnosis not present

## 2014-11-01 DIAGNOSIS — Z992 Dependence on renal dialysis: Secondary | ICD-10-CM | POA: Diagnosis not present

## 2014-11-04 DIAGNOSIS — D509 Iron deficiency anemia, unspecified: Secondary | ICD-10-CM | POA: Diagnosis not present

## 2014-11-04 DIAGNOSIS — N2581 Secondary hyperparathyroidism of renal origin: Secondary | ICD-10-CM | POA: Diagnosis not present

## 2014-11-04 DIAGNOSIS — Z992 Dependence on renal dialysis: Secondary | ICD-10-CM | POA: Diagnosis not present

## 2014-11-04 DIAGNOSIS — N186 End stage renal disease: Secondary | ICD-10-CM | POA: Diagnosis not present

## 2014-11-04 DIAGNOSIS — D631 Anemia in chronic kidney disease: Secondary | ICD-10-CM | POA: Diagnosis not present

## 2014-11-06 DIAGNOSIS — Z992 Dependence on renal dialysis: Secondary | ICD-10-CM | POA: Diagnosis not present

## 2014-11-06 DIAGNOSIS — N2581 Secondary hyperparathyroidism of renal origin: Secondary | ICD-10-CM | POA: Diagnosis not present

## 2014-11-06 DIAGNOSIS — N186 End stage renal disease: Secondary | ICD-10-CM | POA: Diagnosis not present

## 2014-11-06 DIAGNOSIS — D509 Iron deficiency anemia, unspecified: Secondary | ICD-10-CM | POA: Diagnosis not present

## 2014-11-06 DIAGNOSIS — D631 Anemia in chronic kidney disease: Secondary | ICD-10-CM | POA: Diagnosis not present

## 2014-11-07 DIAGNOSIS — Z992 Dependence on renal dialysis: Secondary | ICD-10-CM | POA: Diagnosis not present

## 2014-11-07 DIAGNOSIS — N186 End stage renal disease: Secondary | ICD-10-CM | POA: Diagnosis not present

## 2014-11-08 ENCOUNTER — Encounter (HOSPITAL_COMMUNITY): Payer: Self-pay

## 2014-11-08 DIAGNOSIS — Z992 Dependence on renal dialysis: Secondary | ICD-10-CM | POA: Diagnosis not present

## 2014-11-08 DIAGNOSIS — N186 End stage renal disease: Secondary | ICD-10-CM | POA: Diagnosis not present

## 2014-11-08 DIAGNOSIS — N2581 Secondary hyperparathyroidism of renal origin: Secondary | ICD-10-CM | POA: Diagnosis not present

## 2014-11-08 DIAGNOSIS — D509 Iron deficiency anemia, unspecified: Secondary | ICD-10-CM | POA: Diagnosis not present

## 2014-11-08 DIAGNOSIS — D631 Anemia in chronic kidney disease: Secondary | ICD-10-CM | POA: Diagnosis not present

## 2014-11-11 DIAGNOSIS — N2581 Secondary hyperparathyroidism of renal origin: Secondary | ICD-10-CM | POA: Diagnosis not present

## 2014-11-11 DIAGNOSIS — D631 Anemia in chronic kidney disease: Secondary | ICD-10-CM | POA: Diagnosis not present

## 2014-11-11 DIAGNOSIS — Z992 Dependence on renal dialysis: Secondary | ICD-10-CM | POA: Diagnosis not present

## 2014-11-11 DIAGNOSIS — D509 Iron deficiency anemia, unspecified: Secondary | ICD-10-CM | POA: Diagnosis not present

## 2014-11-11 DIAGNOSIS — N186 End stage renal disease: Secondary | ICD-10-CM | POA: Diagnosis not present

## 2014-11-13 DIAGNOSIS — N186 End stage renal disease: Secondary | ICD-10-CM | POA: Diagnosis not present

## 2014-11-13 DIAGNOSIS — D631 Anemia in chronic kidney disease: Secondary | ICD-10-CM | POA: Diagnosis not present

## 2014-11-13 DIAGNOSIS — N2581 Secondary hyperparathyroidism of renal origin: Secondary | ICD-10-CM | POA: Diagnosis not present

## 2014-11-13 DIAGNOSIS — D509 Iron deficiency anemia, unspecified: Secondary | ICD-10-CM | POA: Diagnosis not present

## 2014-11-13 DIAGNOSIS — Z992 Dependence on renal dialysis: Secondary | ICD-10-CM | POA: Diagnosis not present

## 2014-11-14 ENCOUNTER — Other Ambulatory Visit (HOSPITAL_COMMUNITY): Payer: Self-pay | Admitting: Oncology

## 2014-11-14 DIAGNOSIS — C3492 Malignant neoplasm of unspecified part of left bronchus or lung: Secondary | ICD-10-CM

## 2014-11-15 DIAGNOSIS — D631 Anemia in chronic kidney disease: Secondary | ICD-10-CM | POA: Diagnosis not present

## 2014-11-15 DIAGNOSIS — D509 Iron deficiency anemia, unspecified: Secondary | ICD-10-CM | POA: Diagnosis not present

## 2014-11-15 DIAGNOSIS — N2581 Secondary hyperparathyroidism of renal origin: Secondary | ICD-10-CM | POA: Diagnosis not present

## 2014-11-15 DIAGNOSIS — Z992 Dependence on renal dialysis: Secondary | ICD-10-CM | POA: Diagnosis not present

## 2014-11-15 DIAGNOSIS — N186 End stage renal disease: Secondary | ICD-10-CM | POA: Diagnosis not present

## 2014-11-18 DIAGNOSIS — Z992 Dependence on renal dialysis: Secondary | ICD-10-CM | POA: Diagnosis not present

## 2014-11-18 DIAGNOSIS — D631 Anemia in chronic kidney disease: Secondary | ICD-10-CM | POA: Diagnosis not present

## 2014-11-18 DIAGNOSIS — D509 Iron deficiency anemia, unspecified: Secondary | ICD-10-CM | POA: Diagnosis not present

## 2014-11-18 DIAGNOSIS — N2581 Secondary hyperparathyroidism of renal origin: Secondary | ICD-10-CM | POA: Diagnosis not present

## 2014-11-18 DIAGNOSIS — N186 End stage renal disease: Secondary | ICD-10-CM | POA: Diagnosis not present

## 2014-11-20 DIAGNOSIS — Z992 Dependence on renal dialysis: Secondary | ICD-10-CM | POA: Diagnosis not present

## 2014-11-20 DIAGNOSIS — N2581 Secondary hyperparathyroidism of renal origin: Secondary | ICD-10-CM | POA: Diagnosis not present

## 2014-11-20 DIAGNOSIS — D509 Iron deficiency anemia, unspecified: Secondary | ICD-10-CM | POA: Diagnosis not present

## 2014-11-20 DIAGNOSIS — D631 Anemia in chronic kidney disease: Secondary | ICD-10-CM | POA: Diagnosis not present

## 2014-11-20 DIAGNOSIS — N186 End stage renal disease: Secondary | ICD-10-CM | POA: Diagnosis not present

## 2014-11-21 NOTE — Telephone Encounter (Signed)
Error

## 2014-11-22 DIAGNOSIS — D509 Iron deficiency anemia, unspecified: Secondary | ICD-10-CM | POA: Diagnosis not present

## 2014-11-22 DIAGNOSIS — N186 End stage renal disease: Secondary | ICD-10-CM | POA: Diagnosis not present

## 2014-11-22 DIAGNOSIS — Z992 Dependence on renal dialysis: Secondary | ICD-10-CM | POA: Diagnosis not present

## 2014-11-22 DIAGNOSIS — D631 Anemia in chronic kidney disease: Secondary | ICD-10-CM | POA: Diagnosis not present

## 2014-11-22 DIAGNOSIS — N2581 Secondary hyperparathyroidism of renal origin: Secondary | ICD-10-CM | POA: Diagnosis not present

## 2014-11-23 NOTE — Progress Notes (Signed)
This encounter was created in error - please disregard.

## 2014-11-25 DIAGNOSIS — N186 End stage renal disease: Secondary | ICD-10-CM | POA: Diagnosis not present

## 2014-11-25 DIAGNOSIS — D631 Anemia in chronic kidney disease: Secondary | ICD-10-CM | POA: Diagnosis not present

## 2014-11-25 DIAGNOSIS — D509 Iron deficiency anemia, unspecified: Secondary | ICD-10-CM | POA: Diagnosis not present

## 2014-11-25 DIAGNOSIS — N2581 Secondary hyperparathyroidism of renal origin: Secondary | ICD-10-CM | POA: Diagnosis not present

## 2014-11-25 DIAGNOSIS — Z992 Dependence on renal dialysis: Secondary | ICD-10-CM | POA: Diagnosis not present

## 2014-11-27 DIAGNOSIS — N2581 Secondary hyperparathyroidism of renal origin: Secondary | ICD-10-CM | POA: Diagnosis not present

## 2014-11-27 DIAGNOSIS — D631 Anemia in chronic kidney disease: Secondary | ICD-10-CM | POA: Diagnosis not present

## 2014-11-27 DIAGNOSIS — D509 Iron deficiency anemia, unspecified: Secondary | ICD-10-CM | POA: Diagnosis not present

## 2014-11-27 DIAGNOSIS — N186 End stage renal disease: Secondary | ICD-10-CM | POA: Diagnosis not present

## 2014-11-27 DIAGNOSIS — Z992 Dependence on renal dialysis: Secondary | ICD-10-CM | POA: Diagnosis not present

## 2014-11-29 DIAGNOSIS — D631 Anemia in chronic kidney disease: Secondary | ICD-10-CM | POA: Diagnosis not present

## 2014-11-29 DIAGNOSIS — N186 End stage renal disease: Secondary | ICD-10-CM | POA: Diagnosis not present

## 2014-11-29 DIAGNOSIS — Z992 Dependence on renal dialysis: Secondary | ICD-10-CM | POA: Diagnosis not present

## 2014-11-29 DIAGNOSIS — D509 Iron deficiency anemia, unspecified: Secondary | ICD-10-CM | POA: Diagnosis not present

## 2014-11-29 DIAGNOSIS — N2581 Secondary hyperparathyroidism of renal origin: Secondary | ICD-10-CM | POA: Diagnosis not present

## 2014-12-02 DIAGNOSIS — D631 Anemia in chronic kidney disease: Secondary | ICD-10-CM | POA: Diagnosis not present

## 2014-12-02 DIAGNOSIS — Z992 Dependence on renal dialysis: Secondary | ICD-10-CM | POA: Diagnosis not present

## 2014-12-02 DIAGNOSIS — D509 Iron deficiency anemia, unspecified: Secondary | ICD-10-CM | POA: Diagnosis not present

## 2014-12-02 DIAGNOSIS — N2581 Secondary hyperparathyroidism of renal origin: Secondary | ICD-10-CM | POA: Diagnosis not present

## 2014-12-02 DIAGNOSIS — N186 End stage renal disease: Secondary | ICD-10-CM | POA: Diagnosis not present

## 2014-12-04 DIAGNOSIS — Z992 Dependence on renal dialysis: Secondary | ICD-10-CM | POA: Diagnosis not present

## 2014-12-04 DIAGNOSIS — N2581 Secondary hyperparathyroidism of renal origin: Secondary | ICD-10-CM | POA: Diagnosis not present

## 2014-12-04 DIAGNOSIS — D509 Iron deficiency anemia, unspecified: Secondary | ICD-10-CM | POA: Diagnosis not present

## 2014-12-04 DIAGNOSIS — N186 End stage renal disease: Secondary | ICD-10-CM | POA: Diagnosis not present

## 2014-12-04 DIAGNOSIS — D631 Anemia in chronic kidney disease: Secondary | ICD-10-CM | POA: Diagnosis not present

## 2014-12-06 DIAGNOSIS — D631 Anemia in chronic kidney disease: Secondary | ICD-10-CM | POA: Diagnosis not present

## 2014-12-06 DIAGNOSIS — D509 Iron deficiency anemia, unspecified: Secondary | ICD-10-CM | POA: Diagnosis not present

## 2014-12-06 DIAGNOSIS — Z992 Dependence on renal dialysis: Secondary | ICD-10-CM | POA: Diagnosis not present

## 2014-12-06 DIAGNOSIS — N186 End stage renal disease: Secondary | ICD-10-CM | POA: Diagnosis not present

## 2014-12-06 DIAGNOSIS — N2581 Secondary hyperparathyroidism of renal origin: Secondary | ICD-10-CM | POA: Diagnosis not present

## 2014-12-08 DIAGNOSIS — N186 End stage renal disease: Secondary | ICD-10-CM | POA: Diagnosis not present

## 2014-12-08 DIAGNOSIS — Z992 Dependence on renal dialysis: Secondary | ICD-10-CM | POA: Diagnosis not present

## 2014-12-09 DIAGNOSIS — D509 Iron deficiency anemia, unspecified: Secondary | ICD-10-CM | POA: Diagnosis not present

## 2014-12-09 DIAGNOSIS — Z23 Encounter for immunization: Secondary | ICD-10-CM | POA: Diagnosis not present

## 2014-12-09 DIAGNOSIS — N186 End stage renal disease: Secondary | ICD-10-CM | POA: Diagnosis not present

## 2014-12-09 DIAGNOSIS — N2581 Secondary hyperparathyroidism of renal origin: Secondary | ICD-10-CM | POA: Diagnosis not present

## 2014-12-09 DIAGNOSIS — D631 Anemia in chronic kidney disease: Secondary | ICD-10-CM | POA: Diagnosis not present

## 2014-12-09 DIAGNOSIS — Z992 Dependence on renal dialysis: Secondary | ICD-10-CM | POA: Diagnosis not present

## 2014-12-11 DIAGNOSIS — N186 End stage renal disease: Secondary | ICD-10-CM | POA: Diagnosis not present

## 2014-12-11 DIAGNOSIS — N2581 Secondary hyperparathyroidism of renal origin: Secondary | ICD-10-CM | POA: Diagnosis not present

## 2014-12-11 DIAGNOSIS — Z23 Encounter for immunization: Secondary | ICD-10-CM | POA: Diagnosis not present

## 2014-12-11 DIAGNOSIS — D631 Anemia in chronic kidney disease: Secondary | ICD-10-CM | POA: Diagnosis not present

## 2014-12-11 DIAGNOSIS — D509 Iron deficiency anemia, unspecified: Secondary | ICD-10-CM | POA: Diagnosis not present

## 2014-12-11 DIAGNOSIS — Z992 Dependence on renal dialysis: Secondary | ICD-10-CM | POA: Diagnosis not present

## 2014-12-13 DIAGNOSIS — Z23 Encounter for immunization: Secondary | ICD-10-CM | POA: Diagnosis not present

## 2014-12-13 DIAGNOSIS — D509 Iron deficiency anemia, unspecified: Secondary | ICD-10-CM | POA: Diagnosis not present

## 2014-12-13 DIAGNOSIS — Z992 Dependence on renal dialysis: Secondary | ICD-10-CM | POA: Diagnosis not present

## 2014-12-13 DIAGNOSIS — N2581 Secondary hyperparathyroidism of renal origin: Secondary | ICD-10-CM | POA: Diagnosis not present

## 2014-12-13 DIAGNOSIS — N186 End stage renal disease: Secondary | ICD-10-CM | POA: Diagnosis not present

## 2014-12-13 DIAGNOSIS — D631 Anemia in chronic kidney disease: Secondary | ICD-10-CM | POA: Diagnosis not present

## 2014-12-16 DIAGNOSIS — N2581 Secondary hyperparathyroidism of renal origin: Secondary | ICD-10-CM | POA: Diagnosis not present

## 2014-12-16 DIAGNOSIS — D631 Anemia in chronic kidney disease: Secondary | ICD-10-CM | POA: Diagnosis not present

## 2014-12-16 DIAGNOSIS — N186 End stage renal disease: Secondary | ICD-10-CM | POA: Diagnosis not present

## 2014-12-16 DIAGNOSIS — D509 Iron deficiency anemia, unspecified: Secondary | ICD-10-CM | POA: Diagnosis not present

## 2014-12-16 DIAGNOSIS — Z23 Encounter for immunization: Secondary | ICD-10-CM | POA: Diagnosis not present

## 2014-12-16 DIAGNOSIS — Z992 Dependence on renal dialysis: Secondary | ICD-10-CM | POA: Diagnosis not present

## 2014-12-18 DIAGNOSIS — Z992 Dependence on renal dialysis: Secondary | ICD-10-CM | POA: Diagnosis not present

## 2014-12-18 DIAGNOSIS — Z23 Encounter for immunization: Secondary | ICD-10-CM | POA: Diagnosis not present

## 2014-12-18 DIAGNOSIS — N186 End stage renal disease: Secondary | ICD-10-CM | POA: Diagnosis not present

## 2014-12-18 DIAGNOSIS — D509 Iron deficiency anemia, unspecified: Secondary | ICD-10-CM | POA: Diagnosis not present

## 2014-12-18 DIAGNOSIS — D631 Anemia in chronic kidney disease: Secondary | ICD-10-CM | POA: Diagnosis not present

## 2014-12-18 DIAGNOSIS — N2581 Secondary hyperparathyroidism of renal origin: Secondary | ICD-10-CM | POA: Diagnosis not present

## 2014-12-20 DIAGNOSIS — D631 Anemia in chronic kidney disease: Secondary | ICD-10-CM | POA: Diagnosis not present

## 2014-12-20 DIAGNOSIS — N2581 Secondary hyperparathyroidism of renal origin: Secondary | ICD-10-CM | POA: Diagnosis not present

## 2014-12-20 DIAGNOSIS — D509 Iron deficiency anemia, unspecified: Secondary | ICD-10-CM | POA: Diagnosis not present

## 2014-12-20 DIAGNOSIS — N186 End stage renal disease: Secondary | ICD-10-CM | POA: Diagnosis not present

## 2014-12-20 DIAGNOSIS — Z23 Encounter for immunization: Secondary | ICD-10-CM | POA: Diagnosis not present

## 2014-12-20 DIAGNOSIS — Z992 Dependence on renal dialysis: Secondary | ICD-10-CM | POA: Diagnosis not present

## 2014-12-23 DIAGNOSIS — D631 Anemia in chronic kidney disease: Secondary | ICD-10-CM | POA: Diagnosis not present

## 2014-12-23 DIAGNOSIS — Z23 Encounter for immunization: Secondary | ICD-10-CM | POA: Diagnosis not present

## 2014-12-23 DIAGNOSIS — D509 Iron deficiency anemia, unspecified: Secondary | ICD-10-CM | POA: Diagnosis not present

## 2014-12-23 DIAGNOSIS — N2581 Secondary hyperparathyroidism of renal origin: Secondary | ICD-10-CM | POA: Diagnosis not present

## 2014-12-23 DIAGNOSIS — N186 End stage renal disease: Secondary | ICD-10-CM | POA: Diagnosis not present

## 2014-12-23 DIAGNOSIS — Z992 Dependence on renal dialysis: Secondary | ICD-10-CM | POA: Diagnosis not present

## 2014-12-25 DIAGNOSIS — D631 Anemia in chronic kidney disease: Secondary | ICD-10-CM | POA: Diagnosis not present

## 2014-12-25 DIAGNOSIS — Z23 Encounter for immunization: Secondary | ICD-10-CM | POA: Diagnosis not present

## 2014-12-25 DIAGNOSIS — N186 End stage renal disease: Secondary | ICD-10-CM | POA: Diagnosis not present

## 2014-12-25 DIAGNOSIS — D509 Iron deficiency anemia, unspecified: Secondary | ICD-10-CM | POA: Diagnosis not present

## 2014-12-25 DIAGNOSIS — N2581 Secondary hyperparathyroidism of renal origin: Secondary | ICD-10-CM | POA: Diagnosis not present

## 2014-12-25 DIAGNOSIS — Z992 Dependence on renal dialysis: Secondary | ICD-10-CM | POA: Diagnosis not present

## 2014-12-27 DIAGNOSIS — Z992 Dependence on renal dialysis: Secondary | ICD-10-CM | POA: Diagnosis not present

## 2014-12-27 DIAGNOSIS — Z23 Encounter for immunization: Secondary | ICD-10-CM | POA: Diagnosis not present

## 2014-12-27 DIAGNOSIS — D631 Anemia in chronic kidney disease: Secondary | ICD-10-CM | POA: Diagnosis not present

## 2014-12-27 DIAGNOSIS — D509 Iron deficiency anemia, unspecified: Secondary | ICD-10-CM | POA: Diagnosis not present

## 2014-12-27 DIAGNOSIS — N2581 Secondary hyperparathyroidism of renal origin: Secondary | ICD-10-CM | POA: Diagnosis not present

## 2014-12-27 DIAGNOSIS — N186 End stage renal disease: Secondary | ICD-10-CM | POA: Diagnosis not present

## 2014-12-30 ENCOUNTER — Other Ambulatory Visit (HOSPITAL_COMMUNITY): Payer: Self-pay | Admitting: Oncology

## 2014-12-30 DIAGNOSIS — G6181 Chronic inflammatory demyelinating polyneuritis: Secondary | ICD-10-CM | POA: Diagnosis not present

## 2014-12-30 DIAGNOSIS — D509 Iron deficiency anemia, unspecified: Secondary | ICD-10-CM | POA: Diagnosis not present

## 2014-12-30 DIAGNOSIS — Z992 Dependence on renal dialysis: Secondary | ICD-10-CM | POA: Diagnosis not present

## 2014-12-30 DIAGNOSIS — D631 Anemia in chronic kidney disease: Secondary | ICD-10-CM | POA: Diagnosis not present

## 2014-12-30 DIAGNOSIS — C649 Malignant neoplasm of unspecified kidney, except renal pelvis: Secondary | ICD-10-CM | POA: Diagnosis not present

## 2014-12-30 DIAGNOSIS — N185 Chronic kidney disease, stage 5: Secondary | ICD-10-CM | POA: Diagnosis not present

## 2014-12-30 DIAGNOSIS — N2581 Secondary hyperparathyroidism of renal origin: Secondary | ICD-10-CM | POA: Diagnosis not present

## 2014-12-30 DIAGNOSIS — C3490 Malignant neoplasm of unspecified part of unspecified bronchus or lung: Secondary | ICD-10-CM

## 2014-12-30 DIAGNOSIS — N186 End stage renal disease: Secondary | ICD-10-CM | POA: Diagnosis not present

## 2014-12-30 DIAGNOSIS — Z23 Encounter for immunization: Secondary | ICD-10-CM | POA: Diagnosis not present

## 2014-12-30 DIAGNOSIS — D472 Monoclonal gammopathy: Secondary | ICD-10-CM | POA: Diagnosis not present

## 2014-12-31 ENCOUNTER — Ambulatory Visit (HOSPITAL_COMMUNITY)
Admission: RE | Admit: 2014-12-31 | Discharge: 2014-12-31 | Disposition: A | Discharge status: Home / Self Care | Payer: Medicare Other | Source: Ambulatory Visit | Attending: Oncology | Admitting: Oncology

## 2014-12-31 DIAGNOSIS — Z720 Tobacco use: Secondary | ICD-10-CM | POA: Diagnosis not present

## 2014-12-31 DIAGNOSIS — C3490 Malignant neoplasm of unspecified part of unspecified bronchus or lung: Secondary | ICD-10-CM

## 2014-12-31 DIAGNOSIS — R911 Solitary pulmonary nodule: Secondary | ICD-10-CM | POA: Diagnosis not present

## 2014-12-31 DIAGNOSIS — Z85528 Personal history of other malignant neoplasm of kidney: Secondary | ICD-10-CM | POA: Diagnosis not present

## 2014-12-31 DIAGNOSIS — Z905 Acquired absence of kidney: Secondary | ICD-10-CM | POA: Insufficient documentation

## 2015-01-01 DIAGNOSIS — D509 Iron deficiency anemia, unspecified: Secondary | ICD-10-CM | POA: Diagnosis not present

## 2015-01-01 DIAGNOSIS — N2581 Secondary hyperparathyroidism of renal origin: Secondary | ICD-10-CM | POA: Diagnosis not present

## 2015-01-01 DIAGNOSIS — Z23 Encounter for immunization: Secondary | ICD-10-CM | POA: Diagnosis not present

## 2015-01-01 DIAGNOSIS — D631 Anemia in chronic kidney disease: Secondary | ICD-10-CM | POA: Diagnosis not present

## 2015-01-01 DIAGNOSIS — N186 End stage renal disease: Secondary | ICD-10-CM | POA: Diagnosis not present

## 2015-01-01 DIAGNOSIS — Z992 Dependence on renal dialysis: Secondary | ICD-10-CM | POA: Diagnosis not present

## 2015-01-03 ENCOUNTER — Other Ambulatory Visit (HOSPITAL_COMMUNITY): Payer: Self-pay | Admitting: Oncology

## 2015-01-03 DIAGNOSIS — C649 Malignant neoplasm of unspecified kidney, except renal pelvis: Secondary | ICD-10-CM

## 2015-01-03 DIAGNOSIS — D631 Anemia in chronic kidney disease: Secondary | ICD-10-CM | POA: Diagnosis not present

## 2015-01-03 DIAGNOSIS — C3490 Malignant neoplasm of unspecified part of unspecified bronchus or lung: Secondary | ICD-10-CM

## 2015-01-03 DIAGNOSIS — N186 End stage renal disease: Secondary | ICD-10-CM | POA: Diagnosis not present

## 2015-01-03 DIAGNOSIS — D509 Iron deficiency anemia, unspecified: Secondary | ICD-10-CM | POA: Diagnosis not present

## 2015-01-03 DIAGNOSIS — Z23 Encounter for immunization: Secondary | ICD-10-CM | POA: Diagnosis not present

## 2015-01-03 DIAGNOSIS — N2581 Secondary hyperparathyroidism of renal origin: Secondary | ICD-10-CM | POA: Diagnosis not present

## 2015-01-03 DIAGNOSIS — Z992 Dependence on renal dialysis: Secondary | ICD-10-CM | POA: Diagnosis not present

## 2015-01-06 DIAGNOSIS — D631 Anemia in chronic kidney disease: Secondary | ICD-10-CM | POA: Diagnosis not present

## 2015-01-06 DIAGNOSIS — Z992 Dependence on renal dialysis: Secondary | ICD-10-CM | POA: Diagnosis not present

## 2015-01-06 DIAGNOSIS — N186 End stage renal disease: Secondary | ICD-10-CM | POA: Diagnosis not present

## 2015-01-06 DIAGNOSIS — D509 Iron deficiency anemia, unspecified: Secondary | ICD-10-CM | POA: Diagnosis not present

## 2015-01-06 DIAGNOSIS — N2581 Secondary hyperparathyroidism of renal origin: Secondary | ICD-10-CM | POA: Diagnosis not present

## 2015-01-06 DIAGNOSIS — Z23 Encounter for immunization: Secondary | ICD-10-CM | POA: Diagnosis not present

## 2015-01-08 DIAGNOSIS — Z992 Dependence on renal dialysis: Secondary | ICD-10-CM | POA: Diagnosis not present

## 2015-01-08 DIAGNOSIS — D509 Iron deficiency anemia, unspecified: Secondary | ICD-10-CM | POA: Diagnosis not present

## 2015-01-08 DIAGNOSIS — N2581 Secondary hyperparathyroidism of renal origin: Secondary | ICD-10-CM | POA: Diagnosis not present

## 2015-01-08 DIAGNOSIS — D631 Anemia in chronic kidney disease: Secondary | ICD-10-CM | POA: Diagnosis not present

## 2015-01-08 DIAGNOSIS — N186 End stage renal disease: Secondary | ICD-10-CM | POA: Diagnosis not present

## 2015-01-08 DIAGNOSIS — Z23 Encounter for immunization: Secondary | ICD-10-CM | POA: Diagnosis not present

## 2015-01-10 DIAGNOSIS — D509 Iron deficiency anemia, unspecified: Secondary | ICD-10-CM | POA: Diagnosis not present

## 2015-01-10 DIAGNOSIS — Z992 Dependence on renal dialysis: Secondary | ICD-10-CM | POA: Diagnosis not present

## 2015-01-10 DIAGNOSIS — N186 End stage renal disease: Secondary | ICD-10-CM | POA: Diagnosis not present

## 2015-01-10 DIAGNOSIS — N2581 Secondary hyperparathyroidism of renal origin: Secondary | ICD-10-CM | POA: Diagnosis not present

## 2015-01-10 DIAGNOSIS — D631 Anemia in chronic kidney disease: Secondary | ICD-10-CM | POA: Diagnosis not present

## 2015-01-13 DIAGNOSIS — D509 Iron deficiency anemia, unspecified: Secondary | ICD-10-CM | POA: Diagnosis not present

## 2015-01-13 DIAGNOSIS — N186 End stage renal disease: Secondary | ICD-10-CM | POA: Diagnosis not present

## 2015-01-13 DIAGNOSIS — Z992 Dependence on renal dialysis: Secondary | ICD-10-CM | POA: Diagnosis not present

## 2015-01-13 DIAGNOSIS — D631 Anemia in chronic kidney disease: Secondary | ICD-10-CM | POA: Diagnosis not present

## 2015-01-13 DIAGNOSIS — N2581 Secondary hyperparathyroidism of renal origin: Secondary | ICD-10-CM | POA: Diagnosis not present

## 2015-01-15 DIAGNOSIS — N2581 Secondary hyperparathyroidism of renal origin: Secondary | ICD-10-CM | POA: Diagnosis not present

## 2015-01-15 DIAGNOSIS — D509 Iron deficiency anemia, unspecified: Secondary | ICD-10-CM | POA: Diagnosis not present

## 2015-01-15 DIAGNOSIS — D631 Anemia in chronic kidney disease: Secondary | ICD-10-CM | POA: Diagnosis not present

## 2015-01-15 DIAGNOSIS — Z992 Dependence on renal dialysis: Secondary | ICD-10-CM | POA: Diagnosis not present

## 2015-01-15 DIAGNOSIS — N186 End stage renal disease: Secondary | ICD-10-CM | POA: Diagnosis not present

## 2015-01-17 DIAGNOSIS — D509 Iron deficiency anemia, unspecified: Secondary | ICD-10-CM | POA: Diagnosis not present

## 2015-01-17 DIAGNOSIS — Z992 Dependence on renal dialysis: Secondary | ICD-10-CM | POA: Diagnosis not present

## 2015-01-17 DIAGNOSIS — N2581 Secondary hyperparathyroidism of renal origin: Secondary | ICD-10-CM | POA: Diagnosis not present

## 2015-01-17 DIAGNOSIS — D631 Anemia in chronic kidney disease: Secondary | ICD-10-CM | POA: Diagnosis not present

## 2015-01-17 DIAGNOSIS — N186 End stage renal disease: Secondary | ICD-10-CM | POA: Diagnosis not present

## 2015-01-20 DIAGNOSIS — Z992 Dependence on renal dialysis: Secondary | ICD-10-CM | POA: Diagnosis not present

## 2015-01-20 DIAGNOSIS — N2581 Secondary hyperparathyroidism of renal origin: Secondary | ICD-10-CM | POA: Diagnosis not present

## 2015-01-20 DIAGNOSIS — D509 Iron deficiency anemia, unspecified: Secondary | ICD-10-CM | POA: Diagnosis not present

## 2015-01-20 DIAGNOSIS — N186 End stage renal disease: Secondary | ICD-10-CM | POA: Diagnosis not present

## 2015-01-20 DIAGNOSIS — D631 Anemia in chronic kidney disease: Secondary | ICD-10-CM | POA: Diagnosis not present

## 2015-01-22 DIAGNOSIS — N186 End stage renal disease: Secondary | ICD-10-CM | POA: Diagnosis not present

## 2015-01-22 DIAGNOSIS — D509 Iron deficiency anemia, unspecified: Secondary | ICD-10-CM | POA: Diagnosis not present

## 2015-01-22 DIAGNOSIS — D631 Anemia in chronic kidney disease: Secondary | ICD-10-CM | POA: Diagnosis not present

## 2015-01-22 DIAGNOSIS — Z992 Dependence on renal dialysis: Secondary | ICD-10-CM | POA: Diagnosis not present

## 2015-01-22 DIAGNOSIS — N2581 Secondary hyperparathyroidism of renal origin: Secondary | ICD-10-CM | POA: Diagnosis not present

## 2015-01-24 DIAGNOSIS — Z992 Dependence on renal dialysis: Secondary | ICD-10-CM | POA: Diagnosis not present

## 2015-01-24 DIAGNOSIS — N186 End stage renal disease: Secondary | ICD-10-CM | POA: Diagnosis not present

## 2015-01-24 DIAGNOSIS — D509 Iron deficiency anemia, unspecified: Secondary | ICD-10-CM | POA: Diagnosis not present

## 2015-01-24 DIAGNOSIS — D631 Anemia in chronic kidney disease: Secondary | ICD-10-CM | POA: Diagnosis not present

## 2015-01-24 DIAGNOSIS — N2581 Secondary hyperparathyroidism of renal origin: Secondary | ICD-10-CM | POA: Diagnosis not present

## 2015-01-27 DIAGNOSIS — D509 Iron deficiency anemia, unspecified: Secondary | ICD-10-CM | POA: Diagnosis not present

## 2015-01-27 DIAGNOSIS — N2581 Secondary hyperparathyroidism of renal origin: Secondary | ICD-10-CM | POA: Diagnosis not present

## 2015-01-27 DIAGNOSIS — Z992 Dependence on renal dialysis: Secondary | ICD-10-CM | POA: Diagnosis not present

## 2015-01-27 DIAGNOSIS — N186 End stage renal disease: Secondary | ICD-10-CM | POA: Diagnosis not present

## 2015-01-27 DIAGNOSIS — D631 Anemia in chronic kidney disease: Secondary | ICD-10-CM | POA: Diagnosis not present

## 2015-01-29 DIAGNOSIS — N2581 Secondary hyperparathyroidism of renal origin: Secondary | ICD-10-CM | POA: Diagnosis not present

## 2015-01-29 DIAGNOSIS — N186 End stage renal disease: Secondary | ICD-10-CM | POA: Diagnosis not present

## 2015-01-29 DIAGNOSIS — D509 Iron deficiency anemia, unspecified: Secondary | ICD-10-CM | POA: Diagnosis not present

## 2015-01-29 DIAGNOSIS — Z992 Dependence on renal dialysis: Secondary | ICD-10-CM | POA: Diagnosis not present

## 2015-01-29 DIAGNOSIS — D631 Anemia in chronic kidney disease: Secondary | ICD-10-CM | POA: Diagnosis not present

## 2015-01-31 DIAGNOSIS — N2581 Secondary hyperparathyroidism of renal origin: Secondary | ICD-10-CM | POA: Diagnosis not present

## 2015-01-31 DIAGNOSIS — D509 Iron deficiency anemia, unspecified: Secondary | ICD-10-CM | POA: Diagnosis not present

## 2015-01-31 DIAGNOSIS — N186 End stage renal disease: Secondary | ICD-10-CM | POA: Diagnosis not present

## 2015-01-31 DIAGNOSIS — Z992 Dependence on renal dialysis: Secondary | ICD-10-CM | POA: Diagnosis not present

## 2015-01-31 DIAGNOSIS — D631 Anemia in chronic kidney disease: Secondary | ICD-10-CM | POA: Diagnosis not present

## 2015-02-03 DIAGNOSIS — D509 Iron deficiency anemia, unspecified: Secondary | ICD-10-CM | POA: Diagnosis not present

## 2015-02-03 DIAGNOSIS — D631 Anemia in chronic kidney disease: Secondary | ICD-10-CM | POA: Diagnosis not present

## 2015-02-03 DIAGNOSIS — N186 End stage renal disease: Secondary | ICD-10-CM | POA: Diagnosis not present

## 2015-02-03 DIAGNOSIS — Z992 Dependence on renal dialysis: Secondary | ICD-10-CM | POA: Diagnosis not present

## 2015-02-03 DIAGNOSIS — N2581 Secondary hyperparathyroidism of renal origin: Secondary | ICD-10-CM | POA: Diagnosis not present

## 2015-02-05 DIAGNOSIS — D509 Iron deficiency anemia, unspecified: Secondary | ICD-10-CM | POA: Diagnosis not present

## 2015-02-05 DIAGNOSIS — N186 End stage renal disease: Secondary | ICD-10-CM | POA: Diagnosis not present

## 2015-02-05 DIAGNOSIS — N2581 Secondary hyperparathyroidism of renal origin: Secondary | ICD-10-CM | POA: Diagnosis not present

## 2015-02-05 DIAGNOSIS — Z992 Dependence on renal dialysis: Secondary | ICD-10-CM | POA: Diagnosis not present

## 2015-02-05 DIAGNOSIS — D631 Anemia in chronic kidney disease: Secondary | ICD-10-CM | POA: Diagnosis not present

## 2015-02-07 DIAGNOSIS — D631 Anemia in chronic kidney disease: Secondary | ICD-10-CM | POA: Diagnosis not present

## 2015-02-07 DIAGNOSIS — N2581 Secondary hyperparathyroidism of renal origin: Secondary | ICD-10-CM | POA: Diagnosis not present

## 2015-02-07 DIAGNOSIS — D509 Iron deficiency anemia, unspecified: Secondary | ICD-10-CM | POA: Diagnosis not present

## 2015-02-07 DIAGNOSIS — Z992 Dependence on renal dialysis: Secondary | ICD-10-CM | POA: Diagnosis not present

## 2015-02-07 DIAGNOSIS — N186 End stage renal disease: Secondary | ICD-10-CM | POA: Diagnosis not present

## 2015-02-10 DIAGNOSIS — N2581 Secondary hyperparathyroidism of renal origin: Secondary | ICD-10-CM | POA: Diagnosis not present

## 2015-02-10 DIAGNOSIS — D509 Iron deficiency anemia, unspecified: Secondary | ICD-10-CM | POA: Diagnosis not present

## 2015-02-10 DIAGNOSIS — D631 Anemia in chronic kidney disease: Secondary | ICD-10-CM | POA: Diagnosis not present

## 2015-02-10 DIAGNOSIS — N186 End stage renal disease: Secondary | ICD-10-CM | POA: Diagnosis not present

## 2015-02-10 DIAGNOSIS — Z992 Dependence on renal dialysis: Secondary | ICD-10-CM | POA: Diagnosis not present

## 2015-02-12 DIAGNOSIS — N186 End stage renal disease: Secondary | ICD-10-CM | POA: Diagnosis not present

## 2015-02-12 DIAGNOSIS — D509 Iron deficiency anemia, unspecified: Secondary | ICD-10-CM | POA: Diagnosis not present

## 2015-02-12 DIAGNOSIS — N2581 Secondary hyperparathyroidism of renal origin: Secondary | ICD-10-CM | POA: Diagnosis not present

## 2015-02-12 DIAGNOSIS — D631 Anemia in chronic kidney disease: Secondary | ICD-10-CM | POA: Diagnosis not present

## 2015-02-12 DIAGNOSIS — Z992 Dependence on renal dialysis: Secondary | ICD-10-CM | POA: Diagnosis not present

## 2015-02-14 DIAGNOSIS — N186 End stage renal disease: Secondary | ICD-10-CM | POA: Diagnosis not present

## 2015-02-14 DIAGNOSIS — N2581 Secondary hyperparathyroidism of renal origin: Secondary | ICD-10-CM | POA: Diagnosis not present

## 2015-02-14 DIAGNOSIS — D631 Anemia in chronic kidney disease: Secondary | ICD-10-CM | POA: Diagnosis not present

## 2015-02-14 DIAGNOSIS — Z992 Dependence on renal dialysis: Secondary | ICD-10-CM | POA: Diagnosis not present

## 2015-02-14 DIAGNOSIS — D509 Iron deficiency anemia, unspecified: Secondary | ICD-10-CM | POA: Diagnosis not present

## 2015-02-17 DIAGNOSIS — D509 Iron deficiency anemia, unspecified: Secondary | ICD-10-CM | POA: Diagnosis not present

## 2015-02-17 DIAGNOSIS — N186 End stage renal disease: Secondary | ICD-10-CM | POA: Diagnosis not present

## 2015-02-17 DIAGNOSIS — Z992 Dependence on renal dialysis: Secondary | ICD-10-CM | POA: Diagnosis not present

## 2015-02-17 DIAGNOSIS — N2581 Secondary hyperparathyroidism of renal origin: Secondary | ICD-10-CM | POA: Diagnosis not present

## 2015-02-17 DIAGNOSIS — D631 Anemia in chronic kidney disease: Secondary | ICD-10-CM | POA: Diagnosis not present

## 2015-02-19 DIAGNOSIS — N186 End stage renal disease: Secondary | ICD-10-CM | POA: Diagnosis not present

## 2015-02-19 DIAGNOSIS — D509 Iron deficiency anemia, unspecified: Secondary | ICD-10-CM | POA: Diagnosis not present

## 2015-02-19 DIAGNOSIS — D631 Anemia in chronic kidney disease: Secondary | ICD-10-CM | POA: Diagnosis not present

## 2015-02-19 DIAGNOSIS — Z992 Dependence on renal dialysis: Secondary | ICD-10-CM | POA: Diagnosis not present

## 2015-02-19 DIAGNOSIS — N2581 Secondary hyperparathyroidism of renal origin: Secondary | ICD-10-CM | POA: Diagnosis not present

## 2015-02-21 DIAGNOSIS — N186 End stage renal disease: Secondary | ICD-10-CM | POA: Diagnosis not present

## 2015-02-21 DIAGNOSIS — N2581 Secondary hyperparathyroidism of renal origin: Secondary | ICD-10-CM | POA: Diagnosis not present

## 2015-02-21 DIAGNOSIS — D509 Iron deficiency anemia, unspecified: Secondary | ICD-10-CM | POA: Diagnosis not present

## 2015-02-21 DIAGNOSIS — D631 Anemia in chronic kidney disease: Secondary | ICD-10-CM | POA: Diagnosis not present

## 2015-02-21 DIAGNOSIS — Z992 Dependence on renal dialysis: Secondary | ICD-10-CM | POA: Diagnosis not present

## 2015-02-24 DIAGNOSIS — D631 Anemia in chronic kidney disease: Secondary | ICD-10-CM | POA: Diagnosis not present

## 2015-02-24 DIAGNOSIS — N186 End stage renal disease: Secondary | ICD-10-CM | POA: Diagnosis not present

## 2015-02-24 DIAGNOSIS — N2581 Secondary hyperparathyroidism of renal origin: Secondary | ICD-10-CM | POA: Diagnosis not present

## 2015-02-24 DIAGNOSIS — D509 Iron deficiency anemia, unspecified: Secondary | ICD-10-CM | POA: Diagnosis not present

## 2015-02-24 DIAGNOSIS — Z992 Dependence on renal dialysis: Secondary | ICD-10-CM | POA: Diagnosis not present

## 2015-02-26 DIAGNOSIS — Z992 Dependence on renal dialysis: Secondary | ICD-10-CM | POA: Diagnosis not present

## 2015-02-26 DIAGNOSIS — D509 Iron deficiency anemia, unspecified: Secondary | ICD-10-CM | POA: Diagnosis not present

## 2015-02-26 DIAGNOSIS — D631 Anemia in chronic kidney disease: Secondary | ICD-10-CM | POA: Diagnosis not present

## 2015-02-26 DIAGNOSIS — N2581 Secondary hyperparathyroidism of renal origin: Secondary | ICD-10-CM | POA: Diagnosis not present

## 2015-02-26 DIAGNOSIS — N186 End stage renal disease: Secondary | ICD-10-CM | POA: Diagnosis not present

## 2015-02-28 DIAGNOSIS — N186 End stage renal disease: Secondary | ICD-10-CM | POA: Diagnosis not present

## 2015-02-28 DIAGNOSIS — Z992 Dependence on renal dialysis: Secondary | ICD-10-CM | POA: Diagnosis not present

## 2015-02-28 DIAGNOSIS — D509 Iron deficiency anemia, unspecified: Secondary | ICD-10-CM | POA: Diagnosis not present

## 2015-02-28 DIAGNOSIS — N2581 Secondary hyperparathyroidism of renal origin: Secondary | ICD-10-CM | POA: Diagnosis not present

## 2015-02-28 DIAGNOSIS — D631 Anemia in chronic kidney disease: Secondary | ICD-10-CM | POA: Diagnosis not present

## 2015-03-03 DIAGNOSIS — Z992 Dependence on renal dialysis: Secondary | ICD-10-CM | POA: Diagnosis not present

## 2015-03-03 DIAGNOSIS — D509 Iron deficiency anemia, unspecified: Secondary | ICD-10-CM | POA: Diagnosis not present

## 2015-03-03 DIAGNOSIS — D631 Anemia in chronic kidney disease: Secondary | ICD-10-CM | POA: Diagnosis not present

## 2015-03-03 DIAGNOSIS — N186 End stage renal disease: Secondary | ICD-10-CM | POA: Diagnosis not present

## 2015-03-03 DIAGNOSIS — N2581 Secondary hyperparathyroidism of renal origin: Secondary | ICD-10-CM | POA: Diagnosis not present

## 2015-03-05 DIAGNOSIS — Z992 Dependence on renal dialysis: Secondary | ICD-10-CM | POA: Diagnosis not present

## 2015-03-05 DIAGNOSIS — N2581 Secondary hyperparathyroidism of renal origin: Secondary | ICD-10-CM | POA: Diagnosis not present

## 2015-03-05 DIAGNOSIS — N186 End stage renal disease: Secondary | ICD-10-CM | POA: Diagnosis not present

## 2015-03-05 DIAGNOSIS — D509 Iron deficiency anemia, unspecified: Secondary | ICD-10-CM | POA: Diagnosis not present

## 2015-03-05 DIAGNOSIS — D631 Anemia in chronic kidney disease: Secondary | ICD-10-CM | POA: Diagnosis not present

## 2015-03-07 DIAGNOSIS — N2581 Secondary hyperparathyroidism of renal origin: Secondary | ICD-10-CM | POA: Diagnosis not present

## 2015-03-07 DIAGNOSIS — N186 End stage renal disease: Secondary | ICD-10-CM | POA: Diagnosis not present

## 2015-03-07 DIAGNOSIS — Z992 Dependence on renal dialysis: Secondary | ICD-10-CM | POA: Diagnosis not present

## 2015-03-07 DIAGNOSIS — D509 Iron deficiency anemia, unspecified: Secondary | ICD-10-CM | POA: Diagnosis not present

## 2015-03-07 DIAGNOSIS — D631 Anemia in chronic kidney disease: Secondary | ICD-10-CM | POA: Diagnosis not present

## 2015-03-10 DIAGNOSIS — Z992 Dependence on renal dialysis: Secondary | ICD-10-CM | POA: Diagnosis not present

## 2015-03-10 DIAGNOSIS — D631 Anemia in chronic kidney disease: Secondary | ICD-10-CM | POA: Diagnosis not present

## 2015-03-10 DIAGNOSIS — N186 End stage renal disease: Secondary | ICD-10-CM | POA: Diagnosis not present

## 2015-03-10 DIAGNOSIS — D509 Iron deficiency anemia, unspecified: Secondary | ICD-10-CM | POA: Diagnosis not present

## 2015-03-10 DIAGNOSIS — N2581 Secondary hyperparathyroidism of renal origin: Secondary | ICD-10-CM | POA: Diagnosis not present

## 2015-03-12 DIAGNOSIS — N186 End stage renal disease: Secondary | ICD-10-CM | POA: Diagnosis not present

## 2015-03-12 DIAGNOSIS — D509 Iron deficiency anemia, unspecified: Secondary | ICD-10-CM | POA: Diagnosis not present

## 2015-03-12 DIAGNOSIS — D631 Anemia in chronic kidney disease: Secondary | ICD-10-CM | POA: Diagnosis not present

## 2015-03-12 DIAGNOSIS — Z992 Dependence on renal dialysis: Secondary | ICD-10-CM | POA: Diagnosis not present

## 2015-03-12 DIAGNOSIS — N2581 Secondary hyperparathyroidism of renal origin: Secondary | ICD-10-CM | POA: Diagnosis not present

## 2015-03-14 DIAGNOSIS — Z992 Dependence on renal dialysis: Secondary | ICD-10-CM | POA: Diagnosis not present

## 2015-03-14 DIAGNOSIS — D509 Iron deficiency anemia, unspecified: Secondary | ICD-10-CM | POA: Diagnosis not present

## 2015-03-14 DIAGNOSIS — N186 End stage renal disease: Secondary | ICD-10-CM | POA: Diagnosis not present

## 2015-03-14 DIAGNOSIS — N2581 Secondary hyperparathyroidism of renal origin: Secondary | ICD-10-CM | POA: Diagnosis not present

## 2015-03-14 DIAGNOSIS — D631 Anemia in chronic kidney disease: Secondary | ICD-10-CM | POA: Diagnosis not present

## 2015-03-17 DIAGNOSIS — D509 Iron deficiency anemia, unspecified: Secondary | ICD-10-CM | POA: Diagnosis not present

## 2015-03-17 DIAGNOSIS — D631 Anemia in chronic kidney disease: Secondary | ICD-10-CM | POA: Diagnosis not present

## 2015-03-17 DIAGNOSIS — N2581 Secondary hyperparathyroidism of renal origin: Secondary | ICD-10-CM | POA: Diagnosis not present

## 2015-03-17 DIAGNOSIS — Z992 Dependence on renal dialysis: Secondary | ICD-10-CM | POA: Diagnosis not present

## 2015-03-17 DIAGNOSIS — N186 End stage renal disease: Secondary | ICD-10-CM | POA: Diagnosis not present

## 2015-03-19 DIAGNOSIS — D631 Anemia in chronic kidney disease: Secondary | ICD-10-CM | POA: Diagnosis not present

## 2015-03-19 DIAGNOSIS — D509 Iron deficiency anemia, unspecified: Secondary | ICD-10-CM | POA: Diagnosis not present

## 2015-03-19 DIAGNOSIS — N2581 Secondary hyperparathyroidism of renal origin: Secondary | ICD-10-CM | POA: Diagnosis not present

## 2015-03-19 DIAGNOSIS — Z992 Dependence on renal dialysis: Secondary | ICD-10-CM | POA: Diagnosis not present

## 2015-03-19 DIAGNOSIS — N186 End stage renal disease: Secondary | ICD-10-CM | POA: Diagnosis not present

## 2015-03-21 DIAGNOSIS — D631 Anemia in chronic kidney disease: Secondary | ICD-10-CM | POA: Diagnosis not present

## 2015-03-21 DIAGNOSIS — Z992 Dependence on renal dialysis: Secondary | ICD-10-CM | POA: Diagnosis not present

## 2015-03-21 DIAGNOSIS — N186 End stage renal disease: Secondary | ICD-10-CM | POA: Diagnosis not present

## 2015-03-21 DIAGNOSIS — N2581 Secondary hyperparathyroidism of renal origin: Secondary | ICD-10-CM | POA: Diagnosis not present

## 2015-03-21 DIAGNOSIS — D509 Iron deficiency anemia, unspecified: Secondary | ICD-10-CM | POA: Diagnosis not present

## 2015-03-24 DIAGNOSIS — N186 End stage renal disease: Secondary | ICD-10-CM | POA: Diagnosis not present

## 2015-03-24 DIAGNOSIS — Z992 Dependence on renal dialysis: Secondary | ICD-10-CM | POA: Diagnosis not present

## 2015-03-24 DIAGNOSIS — D509 Iron deficiency anemia, unspecified: Secondary | ICD-10-CM | POA: Diagnosis not present

## 2015-03-24 DIAGNOSIS — N2581 Secondary hyperparathyroidism of renal origin: Secondary | ICD-10-CM | POA: Diagnosis not present

## 2015-03-24 DIAGNOSIS — D631 Anemia in chronic kidney disease: Secondary | ICD-10-CM | POA: Diagnosis not present

## 2015-03-26 DIAGNOSIS — N2581 Secondary hyperparathyroidism of renal origin: Secondary | ICD-10-CM | POA: Diagnosis not present

## 2015-03-26 DIAGNOSIS — Z992 Dependence on renal dialysis: Secondary | ICD-10-CM | POA: Diagnosis not present

## 2015-03-26 DIAGNOSIS — D631 Anemia in chronic kidney disease: Secondary | ICD-10-CM | POA: Diagnosis not present

## 2015-03-26 DIAGNOSIS — D509 Iron deficiency anemia, unspecified: Secondary | ICD-10-CM | POA: Diagnosis not present

## 2015-03-26 DIAGNOSIS — N186 End stage renal disease: Secondary | ICD-10-CM | POA: Diagnosis not present

## 2015-03-28 DIAGNOSIS — D631 Anemia in chronic kidney disease: Secondary | ICD-10-CM | POA: Diagnosis not present

## 2015-03-28 DIAGNOSIS — Z992 Dependence on renal dialysis: Secondary | ICD-10-CM | POA: Diagnosis not present

## 2015-03-28 DIAGNOSIS — D509 Iron deficiency anemia, unspecified: Secondary | ICD-10-CM | POA: Diagnosis not present

## 2015-03-28 DIAGNOSIS — N186 End stage renal disease: Secondary | ICD-10-CM | POA: Diagnosis not present

## 2015-03-28 DIAGNOSIS — N2581 Secondary hyperparathyroidism of renal origin: Secondary | ICD-10-CM | POA: Diagnosis not present

## 2015-03-31 DIAGNOSIS — D509 Iron deficiency anemia, unspecified: Secondary | ICD-10-CM | POA: Diagnosis not present

## 2015-03-31 DIAGNOSIS — D631 Anemia in chronic kidney disease: Secondary | ICD-10-CM | POA: Diagnosis not present

## 2015-03-31 DIAGNOSIS — Z992 Dependence on renal dialysis: Secondary | ICD-10-CM | POA: Diagnosis not present

## 2015-03-31 DIAGNOSIS — N186 End stage renal disease: Secondary | ICD-10-CM | POA: Diagnosis not present

## 2015-03-31 DIAGNOSIS — N2581 Secondary hyperparathyroidism of renal origin: Secondary | ICD-10-CM | POA: Diagnosis not present

## 2015-04-02 DIAGNOSIS — Z992 Dependence on renal dialysis: Secondary | ICD-10-CM | POA: Diagnosis not present

## 2015-04-02 DIAGNOSIS — N2581 Secondary hyperparathyroidism of renal origin: Secondary | ICD-10-CM | POA: Diagnosis not present

## 2015-04-02 DIAGNOSIS — D509 Iron deficiency anemia, unspecified: Secondary | ICD-10-CM | POA: Diagnosis not present

## 2015-04-02 DIAGNOSIS — N186 End stage renal disease: Secondary | ICD-10-CM | POA: Diagnosis not present

## 2015-04-02 DIAGNOSIS — D631 Anemia in chronic kidney disease: Secondary | ICD-10-CM | POA: Diagnosis not present

## 2015-04-04 DIAGNOSIS — Z992 Dependence on renal dialysis: Secondary | ICD-10-CM | POA: Diagnosis not present

## 2015-04-04 DIAGNOSIS — D631 Anemia in chronic kidney disease: Secondary | ICD-10-CM | POA: Diagnosis not present

## 2015-04-04 DIAGNOSIS — N186 End stage renal disease: Secondary | ICD-10-CM | POA: Diagnosis not present

## 2015-04-04 DIAGNOSIS — D509 Iron deficiency anemia, unspecified: Secondary | ICD-10-CM | POA: Diagnosis not present

## 2015-04-04 DIAGNOSIS — N2581 Secondary hyperparathyroidism of renal origin: Secondary | ICD-10-CM | POA: Diagnosis not present

## 2015-04-07 DIAGNOSIS — N186 End stage renal disease: Secondary | ICD-10-CM | POA: Diagnosis not present

## 2015-04-07 DIAGNOSIS — D509 Iron deficiency anemia, unspecified: Secondary | ICD-10-CM | POA: Diagnosis not present

## 2015-04-07 DIAGNOSIS — D631 Anemia in chronic kidney disease: Secondary | ICD-10-CM | POA: Diagnosis not present

## 2015-04-07 DIAGNOSIS — Z992 Dependence on renal dialysis: Secondary | ICD-10-CM | POA: Diagnosis not present

## 2015-04-07 DIAGNOSIS — N2581 Secondary hyperparathyroidism of renal origin: Secondary | ICD-10-CM | POA: Diagnosis not present

## 2015-04-09 DIAGNOSIS — N2581 Secondary hyperparathyroidism of renal origin: Secondary | ICD-10-CM | POA: Diagnosis not present

## 2015-04-09 DIAGNOSIS — D631 Anemia in chronic kidney disease: Secondary | ICD-10-CM | POA: Diagnosis not present

## 2015-04-09 DIAGNOSIS — D509 Iron deficiency anemia, unspecified: Secondary | ICD-10-CM | POA: Diagnosis not present

## 2015-04-09 DIAGNOSIS — Z992 Dependence on renal dialysis: Secondary | ICD-10-CM | POA: Diagnosis not present

## 2015-04-09 DIAGNOSIS — N186 End stage renal disease: Secondary | ICD-10-CM | POA: Diagnosis not present

## 2015-04-11 DIAGNOSIS — D631 Anemia in chronic kidney disease: Secondary | ICD-10-CM | POA: Diagnosis not present

## 2015-04-11 DIAGNOSIS — D509 Iron deficiency anemia, unspecified: Secondary | ICD-10-CM | POA: Diagnosis not present

## 2015-04-11 DIAGNOSIS — N186 End stage renal disease: Secondary | ICD-10-CM | POA: Diagnosis not present

## 2015-04-11 DIAGNOSIS — N2581 Secondary hyperparathyroidism of renal origin: Secondary | ICD-10-CM | POA: Diagnosis not present

## 2015-04-11 DIAGNOSIS — Z992 Dependence on renal dialysis: Secondary | ICD-10-CM | POA: Diagnosis not present

## 2015-04-14 DIAGNOSIS — N186 End stage renal disease: Secondary | ICD-10-CM | POA: Diagnosis not present

## 2015-04-14 DIAGNOSIS — D509 Iron deficiency anemia, unspecified: Secondary | ICD-10-CM | POA: Diagnosis not present

## 2015-04-14 DIAGNOSIS — Z992 Dependence on renal dialysis: Secondary | ICD-10-CM | POA: Diagnosis not present

## 2015-04-14 DIAGNOSIS — N2581 Secondary hyperparathyroidism of renal origin: Secondary | ICD-10-CM | POA: Diagnosis not present

## 2015-04-14 DIAGNOSIS — D631 Anemia in chronic kidney disease: Secondary | ICD-10-CM | POA: Diagnosis not present

## 2015-04-16 DIAGNOSIS — D631 Anemia in chronic kidney disease: Secondary | ICD-10-CM | POA: Diagnosis not present

## 2015-04-16 DIAGNOSIS — N2581 Secondary hyperparathyroidism of renal origin: Secondary | ICD-10-CM | POA: Diagnosis not present

## 2015-04-16 DIAGNOSIS — N186 End stage renal disease: Secondary | ICD-10-CM | POA: Diagnosis not present

## 2015-04-16 DIAGNOSIS — D509 Iron deficiency anemia, unspecified: Secondary | ICD-10-CM | POA: Diagnosis not present

## 2015-04-16 DIAGNOSIS — Z992 Dependence on renal dialysis: Secondary | ICD-10-CM | POA: Diagnosis not present

## 2015-04-18 DIAGNOSIS — D509 Iron deficiency anemia, unspecified: Secondary | ICD-10-CM | POA: Diagnosis not present

## 2015-04-18 DIAGNOSIS — N186 End stage renal disease: Secondary | ICD-10-CM | POA: Diagnosis not present

## 2015-04-18 DIAGNOSIS — D631 Anemia in chronic kidney disease: Secondary | ICD-10-CM | POA: Diagnosis not present

## 2015-04-18 DIAGNOSIS — Z992 Dependence on renal dialysis: Secondary | ICD-10-CM | POA: Diagnosis not present

## 2015-04-18 DIAGNOSIS — N2581 Secondary hyperparathyroidism of renal origin: Secondary | ICD-10-CM | POA: Diagnosis not present

## 2015-04-21 DIAGNOSIS — N2581 Secondary hyperparathyroidism of renal origin: Secondary | ICD-10-CM | POA: Diagnosis not present

## 2015-04-21 DIAGNOSIS — Z992 Dependence on renal dialysis: Secondary | ICD-10-CM | POA: Diagnosis not present

## 2015-04-21 DIAGNOSIS — D631 Anemia in chronic kidney disease: Secondary | ICD-10-CM | POA: Diagnosis not present

## 2015-04-21 DIAGNOSIS — N186 End stage renal disease: Secondary | ICD-10-CM | POA: Diagnosis not present

## 2015-04-21 DIAGNOSIS — D509 Iron deficiency anemia, unspecified: Secondary | ICD-10-CM | POA: Diagnosis not present

## 2015-04-23 DIAGNOSIS — D509 Iron deficiency anemia, unspecified: Secondary | ICD-10-CM | POA: Diagnosis not present

## 2015-04-23 DIAGNOSIS — Z992 Dependence on renal dialysis: Secondary | ICD-10-CM | POA: Diagnosis not present

## 2015-04-23 DIAGNOSIS — N2581 Secondary hyperparathyroidism of renal origin: Secondary | ICD-10-CM | POA: Diagnosis not present

## 2015-04-23 DIAGNOSIS — N186 End stage renal disease: Secondary | ICD-10-CM | POA: Diagnosis not present

## 2015-04-23 DIAGNOSIS — D631 Anemia in chronic kidney disease: Secondary | ICD-10-CM | POA: Diagnosis not present

## 2015-04-25 DIAGNOSIS — N186 End stage renal disease: Secondary | ICD-10-CM | POA: Diagnosis not present

## 2015-04-25 DIAGNOSIS — D631 Anemia in chronic kidney disease: Secondary | ICD-10-CM | POA: Diagnosis not present

## 2015-04-25 DIAGNOSIS — D509 Iron deficiency anemia, unspecified: Secondary | ICD-10-CM | POA: Diagnosis not present

## 2015-04-25 DIAGNOSIS — Z992 Dependence on renal dialysis: Secondary | ICD-10-CM | POA: Diagnosis not present

## 2015-04-25 DIAGNOSIS — N2581 Secondary hyperparathyroidism of renal origin: Secondary | ICD-10-CM | POA: Diagnosis not present

## 2015-04-28 DIAGNOSIS — N186 End stage renal disease: Secondary | ICD-10-CM | POA: Diagnosis not present

## 2015-04-28 DIAGNOSIS — Z992 Dependence on renal dialysis: Secondary | ICD-10-CM | POA: Diagnosis not present

## 2015-04-28 DIAGNOSIS — D509 Iron deficiency anemia, unspecified: Secondary | ICD-10-CM | POA: Diagnosis not present

## 2015-04-28 DIAGNOSIS — N2581 Secondary hyperparathyroidism of renal origin: Secondary | ICD-10-CM | POA: Diagnosis not present

## 2015-04-28 DIAGNOSIS — D631 Anemia in chronic kidney disease: Secondary | ICD-10-CM | POA: Diagnosis not present

## 2015-04-30 DIAGNOSIS — N186 End stage renal disease: Secondary | ICD-10-CM | POA: Diagnosis not present

## 2015-04-30 DIAGNOSIS — D509 Iron deficiency anemia, unspecified: Secondary | ICD-10-CM | POA: Diagnosis not present

## 2015-04-30 DIAGNOSIS — Z992 Dependence on renal dialysis: Secondary | ICD-10-CM | POA: Diagnosis not present

## 2015-04-30 DIAGNOSIS — D631 Anemia in chronic kidney disease: Secondary | ICD-10-CM | POA: Diagnosis not present

## 2015-04-30 DIAGNOSIS — N2581 Secondary hyperparathyroidism of renal origin: Secondary | ICD-10-CM | POA: Diagnosis not present

## 2015-05-02 DIAGNOSIS — D631 Anemia in chronic kidney disease: Secondary | ICD-10-CM | POA: Diagnosis not present

## 2015-05-02 DIAGNOSIS — Z992 Dependence on renal dialysis: Secondary | ICD-10-CM | POA: Diagnosis not present

## 2015-05-02 DIAGNOSIS — D509 Iron deficiency anemia, unspecified: Secondary | ICD-10-CM | POA: Diagnosis not present

## 2015-05-02 DIAGNOSIS — N2581 Secondary hyperparathyroidism of renal origin: Secondary | ICD-10-CM | POA: Diagnosis not present

## 2015-05-02 DIAGNOSIS — N186 End stage renal disease: Secondary | ICD-10-CM | POA: Diagnosis not present

## 2015-05-05 DIAGNOSIS — N2581 Secondary hyperparathyroidism of renal origin: Secondary | ICD-10-CM | POA: Diagnosis not present

## 2015-05-05 DIAGNOSIS — D631 Anemia in chronic kidney disease: Secondary | ICD-10-CM | POA: Diagnosis not present

## 2015-05-05 DIAGNOSIS — Z992 Dependence on renal dialysis: Secondary | ICD-10-CM | POA: Diagnosis not present

## 2015-05-05 DIAGNOSIS — D509 Iron deficiency anemia, unspecified: Secondary | ICD-10-CM | POA: Diagnosis not present

## 2015-05-05 DIAGNOSIS — N186 End stage renal disease: Secondary | ICD-10-CM | POA: Diagnosis not present

## 2015-05-07 DIAGNOSIS — Z992 Dependence on renal dialysis: Secondary | ICD-10-CM | POA: Diagnosis not present

## 2015-05-07 DIAGNOSIS — D631 Anemia in chronic kidney disease: Secondary | ICD-10-CM | POA: Diagnosis not present

## 2015-05-07 DIAGNOSIS — N186 End stage renal disease: Secondary | ICD-10-CM | POA: Diagnosis not present

## 2015-05-07 DIAGNOSIS — N2581 Secondary hyperparathyroidism of renal origin: Secondary | ICD-10-CM | POA: Diagnosis not present

## 2015-05-07 DIAGNOSIS — D509 Iron deficiency anemia, unspecified: Secondary | ICD-10-CM | POA: Diagnosis not present

## 2015-05-09 DIAGNOSIS — N2581 Secondary hyperparathyroidism of renal origin: Secondary | ICD-10-CM | POA: Diagnosis not present

## 2015-05-09 DIAGNOSIS — Z992 Dependence on renal dialysis: Secondary | ICD-10-CM | POA: Diagnosis not present

## 2015-05-09 DIAGNOSIS — D631 Anemia in chronic kidney disease: Secondary | ICD-10-CM | POA: Diagnosis not present

## 2015-05-09 DIAGNOSIS — N186 End stage renal disease: Secondary | ICD-10-CM | POA: Diagnosis not present

## 2015-05-09 DIAGNOSIS — D509 Iron deficiency anemia, unspecified: Secondary | ICD-10-CM | POA: Diagnosis not present

## 2015-05-10 DIAGNOSIS — Z992 Dependence on renal dialysis: Secondary | ICD-10-CM | POA: Diagnosis not present

## 2015-05-10 DIAGNOSIS — N186 End stage renal disease: Secondary | ICD-10-CM | POA: Diagnosis not present

## 2015-05-12 DIAGNOSIS — Z992 Dependence on renal dialysis: Secondary | ICD-10-CM | POA: Diagnosis not present

## 2015-05-12 DIAGNOSIS — N186 End stage renal disease: Secondary | ICD-10-CM | POA: Diagnosis not present

## 2015-05-12 DIAGNOSIS — D509 Iron deficiency anemia, unspecified: Secondary | ICD-10-CM | POA: Diagnosis not present

## 2015-05-12 DIAGNOSIS — D631 Anemia in chronic kidney disease: Secondary | ICD-10-CM | POA: Diagnosis not present

## 2015-05-12 DIAGNOSIS — N2581 Secondary hyperparathyroidism of renal origin: Secondary | ICD-10-CM | POA: Diagnosis not present

## 2015-05-14 DIAGNOSIS — N2581 Secondary hyperparathyroidism of renal origin: Secondary | ICD-10-CM | POA: Diagnosis not present

## 2015-05-14 DIAGNOSIS — N186 End stage renal disease: Secondary | ICD-10-CM | POA: Diagnosis not present

## 2015-05-14 DIAGNOSIS — D631 Anemia in chronic kidney disease: Secondary | ICD-10-CM | POA: Diagnosis not present

## 2015-05-14 DIAGNOSIS — D509 Iron deficiency anemia, unspecified: Secondary | ICD-10-CM | POA: Diagnosis not present

## 2015-05-14 DIAGNOSIS — Z992 Dependence on renal dialysis: Secondary | ICD-10-CM | POA: Diagnosis not present

## 2015-05-16 DIAGNOSIS — N186 End stage renal disease: Secondary | ICD-10-CM | POA: Diagnosis not present

## 2015-05-16 DIAGNOSIS — Z992 Dependence on renal dialysis: Secondary | ICD-10-CM | POA: Diagnosis not present

## 2015-05-16 DIAGNOSIS — D631 Anemia in chronic kidney disease: Secondary | ICD-10-CM | POA: Diagnosis not present

## 2015-05-16 DIAGNOSIS — D509 Iron deficiency anemia, unspecified: Secondary | ICD-10-CM | POA: Diagnosis not present

## 2015-05-16 DIAGNOSIS — N2581 Secondary hyperparathyroidism of renal origin: Secondary | ICD-10-CM | POA: Diagnosis not present

## 2015-05-19 DIAGNOSIS — N2581 Secondary hyperparathyroidism of renal origin: Secondary | ICD-10-CM | POA: Diagnosis not present

## 2015-05-19 DIAGNOSIS — D631 Anemia in chronic kidney disease: Secondary | ICD-10-CM | POA: Diagnosis not present

## 2015-05-19 DIAGNOSIS — D509 Iron deficiency anemia, unspecified: Secondary | ICD-10-CM | POA: Diagnosis not present

## 2015-05-19 DIAGNOSIS — Z992 Dependence on renal dialysis: Secondary | ICD-10-CM | POA: Diagnosis not present

## 2015-05-19 DIAGNOSIS — N186 End stage renal disease: Secondary | ICD-10-CM | POA: Diagnosis not present

## 2015-05-21 DIAGNOSIS — Z992 Dependence on renal dialysis: Secondary | ICD-10-CM | POA: Diagnosis not present

## 2015-05-21 DIAGNOSIS — N2581 Secondary hyperparathyroidism of renal origin: Secondary | ICD-10-CM | POA: Diagnosis not present

## 2015-05-21 DIAGNOSIS — N186 End stage renal disease: Secondary | ICD-10-CM | POA: Diagnosis not present

## 2015-05-21 DIAGNOSIS — D509 Iron deficiency anemia, unspecified: Secondary | ICD-10-CM | POA: Diagnosis not present

## 2015-05-21 DIAGNOSIS — D631 Anemia in chronic kidney disease: Secondary | ICD-10-CM | POA: Diagnosis not present

## 2015-05-22 DIAGNOSIS — Z6826 Body mass index (BMI) 26.0-26.9, adult: Secondary | ICD-10-CM | POA: Diagnosis not present

## 2015-05-22 DIAGNOSIS — Z1389 Encounter for screening for other disorder: Secondary | ICD-10-CM | POA: Diagnosis not present

## 2015-05-22 DIAGNOSIS — J209 Acute bronchitis, unspecified: Secondary | ICD-10-CM | POA: Diagnosis not present

## 2015-05-22 DIAGNOSIS — J069 Acute upper respiratory infection, unspecified: Secondary | ICD-10-CM | POA: Diagnosis not present

## 2015-05-22 DIAGNOSIS — C3490 Malignant neoplasm of unspecified part of unspecified bronchus or lung: Secondary | ICD-10-CM | POA: Diagnosis not present

## 2015-05-23 DIAGNOSIS — D509 Iron deficiency anemia, unspecified: Secondary | ICD-10-CM | POA: Diagnosis not present

## 2015-05-23 DIAGNOSIS — Z992 Dependence on renal dialysis: Secondary | ICD-10-CM | POA: Diagnosis not present

## 2015-05-23 DIAGNOSIS — N2581 Secondary hyperparathyroidism of renal origin: Secondary | ICD-10-CM | POA: Diagnosis not present

## 2015-05-23 DIAGNOSIS — N186 End stage renal disease: Secondary | ICD-10-CM | POA: Diagnosis not present

## 2015-05-23 DIAGNOSIS — D631 Anemia in chronic kidney disease: Secondary | ICD-10-CM | POA: Diagnosis not present

## 2015-05-26 DIAGNOSIS — N2581 Secondary hyperparathyroidism of renal origin: Secondary | ICD-10-CM | POA: Diagnosis not present

## 2015-05-26 DIAGNOSIS — D631 Anemia in chronic kidney disease: Secondary | ICD-10-CM | POA: Diagnosis not present

## 2015-05-26 DIAGNOSIS — D509 Iron deficiency anemia, unspecified: Secondary | ICD-10-CM | POA: Diagnosis not present

## 2015-05-26 DIAGNOSIS — Z992 Dependence on renal dialysis: Secondary | ICD-10-CM | POA: Diagnosis not present

## 2015-05-26 DIAGNOSIS — N186 End stage renal disease: Secondary | ICD-10-CM | POA: Diagnosis not present

## 2015-05-28 DIAGNOSIS — Z6826 Body mass index (BMI) 26.0-26.9, adult: Secondary | ICD-10-CM | POA: Diagnosis not present

## 2015-05-28 DIAGNOSIS — K529 Noninfective gastroenteritis and colitis, unspecified: Secondary | ICD-10-CM | POA: Diagnosis not present

## 2015-05-28 DIAGNOSIS — J189 Pneumonia, unspecified organism: Secondary | ICD-10-CM | POA: Diagnosis not present

## 2015-05-28 DIAGNOSIS — Z1389 Encounter for screening for other disorder: Secondary | ICD-10-CM | POA: Diagnosis not present

## 2015-05-30 DIAGNOSIS — Z992 Dependence on renal dialysis: Secondary | ICD-10-CM | POA: Diagnosis not present

## 2015-05-30 DIAGNOSIS — D631 Anemia in chronic kidney disease: Secondary | ICD-10-CM | POA: Diagnosis not present

## 2015-05-30 DIAGNOSIS — N186 End stage renal disease: Secondary | ICD-10-CM | POA: Diagnosis not present

## 2015-05-30 DIAGNOSIS — D509 Iron deficiency anemia, unspecified: Secondary | ICD-10-CM | POA: Diagnosis not present

## 2015-05-30 DIAGNOSIS — N2581 Secondary hyperparathyroidism of renal origin: Secondary | ICD-10-CM | POA: Diagnosis not present

## 2015-06-02 DIAGNOSIS — N2581 Secondary hyperparathyroidism of renal origin: Secondary | ICD-10-CM | POA: Diagnosis not present

## 2015-06-02 DIAGNOSIS — N186 End stage renal disease: Secondary | ICD-10-CM | POA: Diagnosis not present

## 2015-06-02 DIAGNOSIS — D509 Iron deficiency anemia, unspecified: Secondary | ICD-10-CM | POA: Diagnosis not present

## 2015-06-02 DIAGNOSIS — D631 Anemia in chronic kidney disease: Secondary | ICD-10-CM | POA: Diagnosis not present

## 2015-06-02 DIAGNOSIS — Z992 Dependence on renal dialysis: Secondary | ICD-10-CM | POA: Diagnosis not present

## 2015-06-04 DIAGNOSIS — N2581 Secondary hyperparathyroidism of renal origin: Secondary | ICD-10-CM | POA: Diagnosis not present

## 2015-06-04 DIAGNOSIS — D631 Anemia in chronic kidney disease: Secondary | ICD-10-CM | POA: Diagnosis not present

## 2015-06-04 DIAGNOSIS — N186 End stage renal disease: Secondary | ICD-10-CM | POA: Diagnosis not present

## 2015-06-04 DIAGNOSIS — Z992 Dependence on renal dialysis: Secondary | ICD-10-CM | POA: Diagnosis not present

## 2015-06-04 DIAGNOSIS — D509 Iron deficiency anemia, unspecified: Secondary | ICD-10-CM | POA: Diagnosis not present

## 2015-06-06 DIAGNOSIS — Z992 Dependence on renal dialysis: Secondary | ICD-10-CM | POA: Diagnosis not present

## 2015-06-06 DIAGNOSIS — N186 End stage renal disease: Secondary | ICD-10-CM | POA: Diagnosis not present

## 2015-06-06 DIAGNOSIS — N2581 Secondary hyperparathyroidism of renal origin: Secondary | ICD-10-CM | POA: Diagnosis not present

## 2015-06-06 DIAGNOSIS — D631 Anemia in chronic kidney disease: Secondary | ICD-10-CM | POA: Diagnosis not present

## 2015-06-06 DIAGNOSIS — D509 Iron deficiency anemia, unspecified: Secondary | ICD-10-CM | POA: Diagnosis not present

## 2015-06-09 DIAGNOSIS — D509 Iron deficiency anemia, unspecified: Secondary | ICD-10-CM | POA: Diagnosis not present

## 2015-06-09 DIAGNOSIS — N2581 Secondary hyperparathyroidism of renal origin: Secondary | ICD-10-CM | POA: Diagnosis not present

## 2015-06-09 DIAGNOSIS — Z992 Dependence on renal dialysis: Secondary | ICD-10-CM | POA: Diagnosis not present

## 2015-06-09 DIAGNOSIS — D631 Anemia in chronic kidney disease: Secondary | ICD-10-CM | POA: Diagnosis not present

## 2015-06-09 DIAGNOSIS — N186 End stage renal disease: Secondary | ICD-10-CM | POA: Diagnosis not present

## 2015-06-10 DIAGNOSIS — N186 End stage renal disease: Secondary | ICD-10-CM | POA: Diagnosis not present

## 2015-06-10 DIAGNOSIS — Z992 Dependence on renal dialysis: Secondary | ICD-10-CM | POA: Diagnosis not present

## 2015-06-11 ENCOUNTER — Encounter (HOSPITAL_COMMUNITY): Payer: Self-pay

## 2015-06-11 ENCOUNTER — Emergency Department (HOSPITAL_COMMUNITY): Payer: Medicare Other

## 2015-06-11 ENCOUNTER — Emergency Department (HOSPITAL_COMMUNITY)
Admission: EM | Admit: 2015-06-11 | Discharge: 2015-06-11 | Disposition: A | Discharge status: Home / Self Care | Payer: Medicare Other | Attending: Emergency Medicine | Admitting: Emergency Medicine

## 2015-06-11 DIAGNOSIS — Z85118 Personal history of other malignant neoplasm of bronchus and lung: Secondary | ICD-10-CM | POA: Insufficient documentation

## 2015-06-11 DIAGNOSIS — W1839XA Other fall on same level, initial encounter: Secondary | ICD-10-CM | POA: Insufficient documentation

## 2015-06-11 DIAGNOSIS — Z8669 Personal history of other diseases of the nervous system and sense organs: Secondary | ICD-10-CM | POA: Insufficient documentation

## 2015-06-11 DIAGNOSIS — J449 Chronic obstructive pulmonary disease, unspecified: Secondary | ICD-10-CM | POA: Insufficient documentation

## 2015-06-11 DIAGNOSIS — I129 Hypertensive chronic kidney disease with stage 1 through stage 4 chronic kidney disease, or unspecified chronic kidney disease: Secondary | ICD-10-CM | POA: Insufficient documentation

## 2015-06-11 DIAGNOSIS — M25551 Pain in right hip: Secondary | ICD-10-CM | POA: Diagnosis not present

## 2015-06-11 DIAGNOSIS — Z8719 Personal history of other diseases of the digestive system: Secondary | ICD-10-CM | POA: Insufficient documentation

## 2015-06-11 DIAGNOSIS — Z87442 Personal history of urinary calculi: Secondary | ICD-10-CM | POA: Diagnosis not present

## 2015-06-11 DIAGNOSIS — Y9389 Activity, other specified: Secondary | ICD-10-CM | POA: Diagnosis not present

## 2015-06-11 DIAGNOSIS — Z8739 Personal history of other diseases of the musculoskeletal system and connective tissue: Secondary | ICD-10-CM | POA: Insufficient documentation

## 2015-06-11 DIAGNOSIS — Z85528 Personal history of other malignant neoplasm of kidney: Secondary | ICD-10-CM | POA: Insufficient documentation

## 2015-06-11 DIAGNOSIS — R52 Pain, unspecified: Secondary | ICD-10-CM

## 2015-06-11 DIAGNOSIS — Z79899 Other long term (current) drug therapy: Secondary | ICD-10-CM | POA: Diagnosis not present

## 2015-06-11 DIAGNOSIS — Z87448 Personal history of other diseases of urinary system: Secondary | ICD-10-CM | POA: Diagnosis not present

## 2015-06-11 DIAGNOSIS — Y9289 Other specified places as the place of occurrence of the external cause: Secondary | ICD-10-CM | POA: Insufficient documentation

## 2015-06-11 DIAGNOSIS — Z87891 Personal history of nicotine dependence: Secondary | ICD-10-CM | POA: Diagnosis not present

## 2015-06-11 DIAGNOSIS — N189 Chronic kidney disease, unspecified: Secondary | ICD-10-CM | POA: Insufficient documentation

## 2015-06-11 DIAGNOSIS — Z992 Dependence on renal dialysis: Secondary | ICD-10-CM | POA: Diagnosis not present

## 2015-06-11 DIAGNOSIS — Y998 Other external cause status: Secondary | ICD-10-CM | POA: Diagnosis not present

## 2015-06-11 DIAGNOSIS — S79911A Unspecified injury of right hip, initial encounter: Secondary | ICD-10-CM | POA: Diagnosis not present

## 2015-06-11 DIAGNOSIS — S7001XA Contusion of right hip, initial encounter: Secondary | ICD-10-CM | POA: Insufficient documentation

## 2015-06-11 MED ORDER — HYDROCODONE-ACETAMINOPHEN 5-325 MG PO TABS: 1.0000 | ORAL_TABLET | Freq: Four times a day (QID) | ORAL | Status: DC | PRN

## 2015-06-11 NOTE — ED Notes (Signed)
Pt says was walking out of the [facility] yesterday, lost his balance and fell.  Denies dizziness.  C/O pain to r hip, thigh.  Pt ambulatory with a cane today.  Says usually doesn't have to use a cane but has "bad knees" and unsteady gait at times.  Denies hitting head or losing consciousness.

## 2015-06-11 NOTE — Discharge Instructions (Signed)
Follow up with your md next week. °

## 2015-06-11 NOTE — ED Provider Notes (Signed)
CSN: [id number]     Arrival date & time 06/11/15  3532 History   First MD Initiated Contact with Patient 06/11/15 0703     Chief Complaint  Patient presents with  . Fall     (Consider location/radiation/quality/duration/timing/severity/associated sxs/prior Treatment) Patient is a 82 y.o. male presenting with fall. The history is provided by the patient ( Patient states he fell on his right hip couple days ago patient has pain in right hip but is able to ambulate).  Fall This is a new problem. The current episode started 2 days ago. The problem occurs constantly. The problem has not changed since onset.Pertinent negatives include no chest pain, no abdominal pain and no headaches. Exacerbated by: Movement. Nothing relieves the symptoms.    Past Medical History  Diagnosis Date  . BPH (benign prostatic hypertrophy)   . GERD (gastroesophageal reflux disease)   . Hypertension     stress test done - many years ago  . Arthritis     ankle , hip, knees , low back   . Chronic inflammatory demyelinating polyneuropathy ([place]) 04/07/11    ,diagnosed 1992, tx. /w prednisone x 17 yrs. has been d/c for 3 yrs.   [name] Kitchen COPD (chronic obstructive pulmonary disease) ([place])     told that he has "a bit of emphysema"  . Chronic kidney disease     ,not on hemo as of yet  . Cancer ([place])     lung  . Cancer [facility]) 2001    renal  . Kidney calculus   . Dialysis patient [facility])    Past Surgical History  Procedure Laterality Date  . Nephrectomy  2001    left  . Lung removal, partial      partial, right  . Ankle fusion      R ankle  . Cholecystectomy    . Joint replacement      L knee- 2010- [facility], L hip partial replacement   . Av fistula placement  06/01/2011    Procedure: ARTERIOVENOUS (AV) FISTULA CREATION;  Surgeon: Angelia Mould, MD;  Location: [facility] OR;  Service: Vascular;  Laterality: Left;  Creation of Left Arteriovenous fistula  . Fistulogram left radial cephalic av fistula  [phone]      Dr.  Scot Dock  . Hip arthroplasty Right     "partial"  . Av fistula placement Left 07/12/2012    Procedure: ARTERIOVENOUS (AV) FISTULA CREATION;  Surgeon: Conrad Esmeralda, MD;  Location: [place];  Service: Vascular;  Laterality: Left;  . Av fistula placement Left 4.11.14  . Bascilic vein transposition Left 08/28/2012    Procedure: BASCILIC VEIN TRANSPOSITION;  Surgeon: Conrad Orosi, MD;  Location: [place];  Service: Vascular;  Laterality: Left;  left 2nd stage basilic vein transposition  . Fistulogram N/A 08/16/2011    Procedure: FISTULOGRAM;  Surgeon: Angelia Mould, MD;  Location[facility] CATH LAB;  Service: Cardiovascular;  Laterality: N/A;  . Shuntogram N/A 05/15/2012    Procedure: Earney Mallet;  Surgeon: Conrad [place], MD;  Location: [facility] CATH LAB;  Service: Cardiovascular;  Laterality: N/A;  . Shuntogram N/A 05/09/2014    Procedure: FISTULOGRAM;  Surgeon: Conrad Ramos, MD;  Location: [facility] CATH LAB;  Service: Cardiovascular;  Laterality: N/A;   Family History  Problem Relation Age of Onset  . Anesthesia problems Neg Hx   . Hypotension Neg Hx   . Malignant hyperthermia Neg Hx   . Pseudochol deficiency Neg Hx   . Heart disease Mother   . Heart  disease Father   . Heart attack Father    Social History  Substance Use Topics  . Smoking status: Former Smoker -- 35 years    Types: Cigarettes    Quit date: 02/04/1977  . Smokeless tobacco: Current User    Types: Chew  . Alcohol Use: No     Comment: beer- x2/ mth    Review of Systems  Constitutional: Negative for appetite change and fatigue.  HENT: Negative for congestion, ear discharge and sinus pressure.   Eyes: Negative for discharge.  Respiratory: Negative for cough.   Cardiovascular: Negative for chest pain.  Gastrointestinal: Negative for abdominal pain and diarrhea.  Genitourinary: Negative for frequency and hematuria.  Musculoskeletal: Negative for back pain.       Right hip pain  Skin: Negative for rash.  Neurological: Negative for seizures  and headaches.  Psychiatric/Behavioral: Negative for hallucinations.      Allergies  Review of patient's allergies indicates no known allergies.  Home Medications   Prior to Admission medications   Medication Sig Start Date End Date Taking? Authorizing Provider  Cholecalciferol (VITAMIN D) 1000 UNITS capsule Take 5,000 Units by mouth every morning.     Historical Provider, MD  HYDROcodone-acetaminophen (NORCO/VICODIN) 5-325 MG tablet Take 1 tablet by mouth every 6 (six) hours as needed. 06/11/15   Milton Ferguson, MD  metoprolol succinate (TOPROL-XL) 25 MG 24 hr tablet Take 25 mg by mouth every morning.     Historical Provider, MD  Olopatadine HCl (PATANASE) 0.6 % SOLN Place 1 drop into the nose 2 (two) times daily.    Historical Provider, MD  oxyCODONE (ROXICODONE) 5 MG immediate release tablet Take 1 tablet (5 mg total) by mouth every 4 (four) hours as needed for pain. 08/28/12   Conrad [place], MD   BP 140/70 mmHg  Pulse 70  Temp(Src) 97.8 F (36.6 C) (Oral)  Resp 14  Ht '5\' 7"'$  (1.702 m)  Wt 170 lb (77.111 kg)  BMI 26.62 kg/m2  SpO2 100% Physical Exam  Constitutional: He is oriented to person, place, and time. He appears well-developed.  HENT:  Head: Normocephalic.  Eyes: Conjunctivae are normal.  Neck: No tracheal deviation present.  Cardiovascular:  No murmur heard. Musculoskeletal: Normal range of motion.  Tender right hip  Neurological: He is oriented to person, place, and time.  Skin: Skin is warm.  Psychiatric: He has a normal mood and affect.    ED Course  Procedures (including critical care time) Labs Review Labs Reviewed - No data to display  Imaging Review Dg Hip Unilat With Pelvis 2-3 Views Right  06/11/2015  CLINICAL DATA:  Generalized right hip pain, fall 2 days ago, history of right hip arthroplasty EXAM: DG HIP (WITH OR WITHOUT PELVIS) 2-3V RIGHT COMPARISON:  None. FINDINGS: Three views of the right hip submitted. No acute fracture or subluxation. There is  diffuse osteopenia. Right hip prosthesis with anatomic alignment. No evidence of prosthesis loosening. Multiple foci of high-density material noted throughout the colon probable ingested contrast material or pills. IMPRESSION: No acute fracture or subluxation. Diffuse osteopenia. Right hip prosthesis with anatomic alignment. No evidence of prosthesis loosening. Electronically Signed   By: Lahoma Crocker M.D.   On: 06/11/2015 08:11   I have personally reviewed and evaluated these images and lab results as part of my medical decision-making.   EKG Interpretation None      MDM   Final diagnoses:  Contusion, hip, right, initial encounter   X-ray of right hip shows  prosthesis but no fracture. Patient will be put on Vicodin and told to stay off his leg as much as possible for the next week. He is to follow-up with his PCP    Milton Ferguson, MD 06/11/15 [phone]

## 2015-06-11 NOTE — ED Notes (Signed)
Pt c/o pain in r hip radiating down r thigh.  Pt says pain is better when he sits down.  Pain worse when walking.  Pedal pulse present.  R leg shorter than left but pt says is not new.  Scars noted to both knees from old surgery.  Dialysis graft in left upper arm positive thrill and bruit.

## 2015-06-13 DIAGNOSIS — N186 End stage renal disease: Secondary | ICD-10-CM | POA: Diagnosis not present

## 2015-06-13 DIAGNOSIS — Z992 Dependence on renal dialysis: Secondary | ICD-10-CM | POA: Diagnosis not present

## 2015-06-13 DIAGNOSIS — D631 Anemia in chronic kidney disease: Secondary | ICD-10-CM | POA: Diagnosis not present

## 2015-06-13 DIAGNOSIS — N2581 Secondary hyperparathyroidism of renal origin: Secondary | ICD-10-CM | POA: Diagnosis not present

## 2015-06-13 DIAGNOSIS — D509 Iron deficiency anemia, unspecified: Secondary | ICD-10-CM | POA: Diagnosis not present

## 2015-06-16 DIAGNOSIS — N186 End stage renal disease: Secondary | ICD-10-CM | POA: Diagnosis not present

## 2015-06-16 DIAGNOSIS — D509 Iron deficiency anemia, unspecified: Secondary | ICD-10-CM | POA: Diagnosis not present

## 2015-06-16 DIAGNOSIS — N2581 Secondary hyperparathyroidism of renal origin: Secondary | ICD-10-CM | POA: Diagnosis not present

## 2015-06-16 DIAGNOSIS — Z992 Dependence on renal dialysis: Secondary | ICD-10-CM | POA: Diagnosis not present

## 2015-06-16 DIAGNOSIS — D631 Anemia in chronic kidney disease: Secondary | ICD-10-CM | POA: Diagnosis not present

## 2015-06-18 DIAGNOSIS — N2581 Secondary hyperparathyroidism of renal origin: Secondary | ICD-10-CM | POA: Diagnosis not present

## 2015-06-18 DIAGNOSIS — D509 Iron deficiency anemia, unspecified: Secondary | ICD-10-CM | POA: Diagnosis not present

## 2015-06-18 DIAGNOSIS — Z992 Dependence on renal dialysis: Secondary | ICD-10-CM | POA: Diagnosis not present

## 2015-06-18 DIAGNOSIS — N186 End stage renal disease: Secondary | ICD-10-CM | POA: Diagnosis not present

## 2015-06-18 DIAGNOSIS — D631 Anemia in chronic kidney disease: Secondary | ICD-10-CM | POA: Diagnosis not present

## 2015-06-20 DIAGNOSIS — D509 Iron deficiency anemia, unspecified: Secondary | ICD-10-CM | POA: Diagnosis not present

## 2015-06-20 DIAGNOSIS — Z992 Dependence on renal dialysis: Secondary | ICD-10-CM | POA: Diagnosis not present

## 2015-06-20 DIAGNOSIS — D631 Anemia in chronic kidney disease: Secondary | ICD-10-CM | POA: Diagnosis not present

## 2015-06-20 DIAGNOSIS — N186 End stage renal disease: Secondary | ICD-10-CM | POA: Diagnosis not present

## 2015-06-20 DIAGNOSIS — N2581 Secondary hyperparathyroidism of renal origin: Secondary | ICD-10-CM | POA: Diagnosis not present

## 2015-06-23 DIAGNOSIS — N2581 Secondary hyperparathyroidism of renal origin: Secondary | ICD-10-CM | POA: Diagnosis not present

## 2015-06-23 DIAGNOSIS — N186 End stage renal disease: Secondary | ICD-10-CM | POA: Diagnosis not present

## 2015-06-23 DIAGNOSIS — D631 Anemia in chronic kidney disease: Secondary | ICD-10-CM | POA: Diagnosis not present

## 2015-06-23 DIAGNOSIS — D509 Iron deficiency anemia, unspecified: Secondary | ICD-10-CM | POA: Diagnosis not present

## 2015-06-23 DIAGNOSIS — Z992 Dependence on renal dialysis: Secondary | ICD-10-CM | POA: Diagnosis not present

## 2015-06-25 DIAGNOSIS — D509 Iron deficiency anemia, unspecified: Secondary | ICD-10-CM | POA: Diagnosis not present

## 2015-06-25 DIAGNOSIS — N186 End stage renal disease: Secondary | ICD-10-CM | POA: Diagnosis not present

## 2015-06-25 DIAGNOSIS — N2581 Secondary hyperparathyroidism of renal origin: Secondary | ICD-10-CM | POA: Diagnosis not present

## 2015-06-25 DIAGNOSIS — Z992 Dependence on renal dialysis: Secondary | ICD-10-CM | POA: Diagnosis not present

## 2015-06-25 DIAGNOSIS — D631 Anemia in chronic kidney disease: Secondary | ICD-10-CM | POA: Diagnosis not present

## 2015-06-27 DIAGNOSIS — N2581 Secondary hyperparathyroidism of renal origin: Secondary | ICD-10-CM | POA: Diagnosis not present

## 2015-06-27 DIAGNOSIS — N186 End stage renal disease: Secondary | ICD-10-CM | POA: Diagnosis not present

## 2015-06-27 DIAGNOSIS — Z992 Dependence on renal dialysis: Secondary | ICD-10-CM | POA: Diagnosis not present

## 2015-06-27 DIAGNOSIS — D509 Iron deficiency anemia, unspecified: Secondary | ICD-10-CM | POA: Diagnosis not present

## 2015-06-27 DIAGNOSIS — D631 Anemia in chronic kidney disease: Secondary | ICD-10-CM | POA: Diagnosis not present

## 2015-06-30 DIAGNOSIS — N2581 Secondary hyperparathyroidism of renal origin: Secondary | ICD-10-CM | POA: Diagnosis not present

## 2015-06-30 DIAGNOSIS — D631 Anemia in chronic kidney disease: Secondary | ICD-10-CM | POA: Diagnosis not present

## 2015-06-30 DIAGNOSIS — D509 Iron deficiency anemia, unspecified: Secondary | ICD-10-CM | POA: Diagnosis not present

## 2015-06-30 DIAGNOSIS — Z992 Dependence on renal dialysis: Secondary | ICD-10-CM | POA: Diagnosis not present

## 2015-06-30 DIAGNOSIS — N186 End stage renal disease: Secondary | ICD-10-CM | POA: Diagnosis not present

## 2015-07-02 DIAGNOSIS — N186 End stage renal disease: Secondary | ICD-10-CM | POA: Diagnosis not present

## 2015-07-02 DIAGNOSIS — D509 Iron deficiency anemia, unspecified: Secondary | ICD-10-CM | POA: Diagnosis not present

## 2015-07-02 DIAGNOSIS — Z992 Dependence on renal dialysis: Secondary | ICD-10-CM | POA: Diagnosis not present

## 2015-07-02 DIAGNOSIS — N2581 Secondary hyperparathyroidism of renal origin: Secondary | ICD-10-CM | POA: Diagnosis not present

## 2015-07-02 DIAGNOSIS — D631 Anemia in chronic kidney disease: Secondary | ICD-10-CM | POA: Diagnosis not present

## 2015-07-04 DIAGNOSIS — N2581 Secondary hyperparathyroidism of renal origin: Secondary | ICD-10-CM | POA: Diagnosis not present

## 2015-07-04 DIAGNOSIS — Z992 Dependence on renal dialysis: Secondary | ICD-10-CM | POA: Diagnosis not present

## 2015-07-04 DIAGNOSIS — D631 Anemia in chronic kidney disease: Secondary | ICD-10-CM | POA: Diagnosis not present

## 2015-07-04 DIAGNOSIS — N186 End stage renal disease: Secondary | ICD-10-CM | POA: Diagnosis not present

## 2015-07-04 DIAGNOSIS — D509 Iron deficiency anemia, unspecified: Secondary | ICD-10-CM | POA: Diagnosis not present

## 2015-07-07 DIAGNOSIS — D631 Anemia in chronic kidney disease: Secondary | ICD-10-CM | POA: Diagnosis not present

## 2015-07-07 DIAGNOSIS — N2581 Secondary hyperparathyroidism of renal origin: Secondary | ICD-10-CM | POA: Diagnosis not present

## 2015-07-07 DIAGNOSIS — D509 Iron deficiency anemia, unspecified: Secondary | ICD-10-CM | POA: Diagnosis not present

## 2015-07-07 DIAGNOSIS — N186 End stage renal disease: Secondary | ICD-10-CM | POA: Diagnosis not present

## 2015-07-07 DIAGNOSIS — Z992 Dependence on renal dialysis: Secondary | ICD-10-CM | POA: Diagnosis not present

## 2015-07-08 DIAGNOSIS — N186 End stage renal disease: Secondary | ICD-10-CM | POA: Diagnosis not present

## 2015-07-08 DIAGNOSIS — Z992 Dependence on renal dialysis: Secondary | ICD-10-CM | POA: Diagnosis not present

## 2015-07-09 DIAGNOSIS — N186 End stage renal disease: Secondary | ICD-10-CM | POA: Diagnosis not present

## 2015-07-09 DIAGNOSIS — N2581 Secondary hyperparathyroidism of renal origin: Secondary | ICD-10-CM | POA: Diagnosis not present

## 2015-07-09 DIAGNOSIS — D509 Iron deficiency anemia, unspecified: Secondary | ICD-10-CM | POA: Diagnosis not present

## 2015-07-09 DIAGNOSIS — D631 Anemia in chronic kidney disease: Secondary | ICD-10-CM | POA: Diagnosis not present

## 2015-07-09 DIAGNOSIS — Z992 Dependence on renal dialysis: Secondary | ICD-10-CM | POA: Diagnosis not present

## 2015-07-11 DIAGNOSIS — D631 Anemia in chronic kidney disease: Secondary | ICD-10-CM | POA: Diagnosis not present

## 2015-07-11 DIAGNOSIS — N2581 Secondary hyperparathyroidism of renal origin: Secondary | ICD-10-CM | POA: Diagnosis not present

## 2015-07-11 DIAGNOSIS — N186 End stage renal disease: Secondary | ICD-10-CM | POA: Diagnosis not present

## 2015-07-11 DIAGNOSIS — Z992 Dependence on renal dialysis: Secondary | ICD-10-CM | POA: Diagnosis not present

## 2015-07-11 DIAGNOSIS — D509 Iron deficiency anemia, unspecified: Secondary | ICD-10-CM | POA: Diagnosis not present

## 2015-07-14 DIAGNOSIS — Z992 Dependence on renal dialysis: Secondary | ICD-10-CM | POA: Diagnosis not present

## 2015-07-14 DIAGNOSIS — D631 Anemia in chronic kidney disease: Secondary | ICD-10-CM | POA: Diagnosis not present

## 2015-07-14 DIAGNOSIS — D509 Iron deficiency anemia, unspecified: Secondary | ICD-10-CM | POA: Diagnosis not present

## 2015-07-14 DIAGNOSIS — N186 End stage renal disease: Secondary | ICD-10-CM | POA: Diagnosis not present

## 2015-07-14 DIAGNOSIS — N2581 Secondary hyperparathyroidism of renal origin: Secondary | ICD-10-CM | POA: Diagnosis not present

## 2015-07-16 DIAGNOSIS — D509 Iron deficiency anemia, unspecified: Secondary | ICD-10-CM | POA: Diagnosis not present

## 2015-07-16 DIAGNOSIS — D631 Anemia in chronic kidney disease: Secondary | ICD-10-CM | POA: Diagnosis not present

## 2015-07-16 DIAGNOSIS — N186 End stage renal disease: Secondary | ICD-10-CM | POA: Diagnosis not present

## 2015-07-16 DIAGNOSIS — N2581 Secondary hyperparathyroidism of renal origin: Secondary | ICD-10-CM | POA: Diagnosis not present

## 2015-07-16 DIAGNOSIS — Z992 Dependence on renal dialysis: Secondary | ICD-10-CM | POA: Diagnosis not present

## 2015-07-18 DIAGNOSIS — N186 End stage renal disease: Secondary | ICD-10-CM | POA: Diagnosis not present

## 2015-07-18 DIAGNOSIS — N2581 Secondary hyperparathyroidism of renal origin: Secondary | ICD-10-CM | POA: Diagnosis not present

## 2015-07-18 DIAGNOSIS — D509 Iron deficiency anemia, unspecified: Secondary | ICD-10-CM | POA: Diagnosis not present

## 2015-07-18 DIAGNOSIS — D631 Anemia in chronic kidney disease: Secondary | ICD-10-CM | POA: Diagnosis not present

## 2015-07-18 DIAGNOSIS — Z992 Dependence on renal dialysis: Secondary | ICD-10-CM | POA: Diagnosis not present

## 2015-07-21 DIAGNOSIS — D631 Anemia in chronic kidney disease: Secondary | ICD-10-CM | POA: Diagnosis not present

## 2015-07-21 DIAGNOSIS — D509 Iron deficiency anemia, unspecified: Secondary | ICD-10-CM | POA: Diagnosis not present

## 2015-07-21 DIAGNOSIS — N186 End stage renal disease: Secondary | ICD-10-CM | POA: Diagnosis not present

## 2015-07-21 DIAGNOSIS — Z992 Dependence on renal dialysis: Secondary | ICD-10-CM | POA: Diagnosis not present

## 2015-07-21 DIAGNOSIS — N2581 Secondary hyperparathyroidism of renal origin: Secondary | ICD-10-CM | POA: Diagnosis not present

## 2015-07-23 DIAGNOSIS — D509 Iron deficiency anemia, unspecified: Secondary | ICD-10-CM | POA: Diagnosis not present

## 2015-07-23 DIAGNOSIS — D631 Anemia in chronic kidney disease: Secondary | ICD-10-CM | POA: Diagnosis not present

## 2015-07-23 DIAGNOSIS — N186 End stage renal disease: Secondary | ICD-10-CM | POA: Diagnosis not present

## 2015-07-23 DIAGNOSIS — Z992 Dependence on renal dialysis: Secondary | ICD-10-CM | POA: Diagnosis not present

## 2015-07-23 DIAGNOSIS — N2581 Secondary hyperparathyroidism of renal origin: Secondary | ICD-10-CM | POA: Diagnosis not present

## 2015-07-25 DIAGNOSIS — D509 Iron deficiency anemia, unspecified: Secondary | ICD-10-CM | POA: Diagnosis not present

## 2015-07-25 DIAGNOSIS — N2581 Secondary hyperparathyroidism of renal origin: Secondary | ICD-10-CM | POA: Diagnosis not present

## 2015-07-25 DIAGNOSIS — Z992 Dependence on renal dialysis: Secondary | ICD-10-CM | POA: Diagnosis not present

## 2015-07-25 DIAGNOSIS — D631 Anemia in chronic kidney disease: Secondary | ICD-10-CM | POA: Diagnosis not present

## 2015-07-25 DIAGNOSIS — N186 End stage renal disease: Secondary | ICD-10-CM | POA: Diagnosis not present

## 2015-07-28 DIAGNOSIS — D509 Iron deficiency anemia, unspecified: Secondary | ICD-10-CM | POA: Diagnosis not present

## 2015-07-28 DIAGNOSIS — N2581 Secondary hyperparathyroidism of renal origin: Secondary | ICD-10-CM | POA: Diagnosis not present

## 2015-07-28 DIAGNOSIS — Z992 Dependence on renal dialysis: Secondary | ICD-10-CM | POA: Diagnosis not present

## 2015-07-28 DIAGNOSIS — D631 Anemia in chronic kidney disease: Secondary | ICD-10-CM | POA: Diagnosis not present

## 2015-07-28 DIAGNOSIS — N186 End stage renal disease: Secondary | ICD-10-CM | POA: Diagnosis not present

## 2015-07-30 DIAGNOSIS — D631 Anemia in chronic kidney disease: Secondary | ICD-10-CM | POA: Diagnosis not present

## 2015-07-30 DIAGNOSIS — N2581 Secondary hyperparathyroidism of renal origin: Secondary | ICD-10-CM | POA: Diagnosis not present

## 2015-07-30 DIAGNOSIS — N186 End stage renal disease: Secondary | ICD-10-CM | POA: Diagnosis not present

## 2015-07-30 DIAGNOSIS — Z992 Dependence on renal dialysis: Secondary | ICD-10-CM | POA: Diagnosis not present

## 2015-07-30 DIAGNOSIS — D509 Iron deficiency anemia, unspecified: Secondary | ICD-10-CM | POA: Diagnosis not present

## 2015-08-01 DIAGNOSIS — D509 Iron deficiency anemia, unspecified: Secondary | ICD-10-CM | POA: Diagnosis not present

## 2015-08-01 DIAGNOSIS — J069 Acute upper respiratory infection, unspecified: Secondary | ICD-10-CM | POA: Diagnosis not present

## 2015-08-01 DIAGNOSIS — N186 End stage renal disease: Secondary | ICD-10-CM | POA: Diagnosis not present

## 2015-08-01 DIAGNOSIS — Z992 Dependence on renal dialysis: Secondary | ICD-10-CM | POA: Diagnosis not present

## 2015-08-01 DIAGNOSIS — E663 Overweight: Secondary | ICD-10-CM | POA: Diagnosis not present

## 2015-08-01 DIAGNOSIS — Z6825 Body mass index (BMI) 25.0-25.9, adult: Secondary | ICD-10-CM | POA: Diagnosis not present

## 2015-08-01 DIAGNOSIS — N2581 Secondary hyperparathyroidism of renal origin: Secondary | ICD-10-CM | POA: Diagnosis not present

## 2015-08-01 DIAGNOSIS — D631 Anemia in chronic kidney disease: Secondary | ICD-10-CM | POA: Diagnosis not present

## 2015-08-01 DIAGNOSIS — Z1389 Encounter for screening for other disorder: Secondary | ICD-10-CM | POA: Diagnosis not present

## 2015-08-04 DIAGNOSIS — D631 Anemia in chronic kidney disease: Secondary | ICD-10-CM | POA: Diagnosis not present

## 2015-08-04 DIAGNOSIS — Z992 Dependence on renal dialysis: Secondary | ICD-10-CM | POA: Diagnosis not present

## 2015-08-04 DIAGNOSIS — D509 Iron deficiency anemia, unspecified: Secondary | ICD-10-CM | POA: Diagnosis not present

## 2015-08-04 DIAGNOSIS — N186 End stage renal disease: Secondary | ICD-10-CM | POA: Diagnosis not present

## 2015-08-04 DIAGNOSIS — N2581 Secondary hyperparathyroidism of renal origin: Secondary | ICD-10-CM | POA: Diagnosis not present

## 2015-08-06 DIAGNOSIS — D631 Anemia in chronic kidney disease: Secondary | ICD-10-CM | POA: Diagnosis not present

## 2015-08-06 DIAGNOSIS — D509 Iron deficiency anemia, unspecified: Secondary | ICD-10-CM | POA: Diagnosis not present

## 2015-08-06 DIAGNOSIS — Z992 Dependence on renal dialysis: Secondary | ICD-10-CM | POA: Diagnosis not present

## 2015-08-06 DIAGNOSIS — N2581 Secondary hyperparathyroidism of renal origin: Secondary | ICD-10-CM | POA: Diagnosis not present

## 2015-08-06 DIAGNOSIS — N186 End stage renal disease: Secondary | ICD-10-CM | POA: Diagnosis not present

## 2015-08-08 DIAGNOSIS — D631 Anemia in chronic kidney disease: Secondary | ICD-10-CM | POA: Diagnosis not present

## 2015-08-08 DIAGNOSIS — D509 Iron deficiency anemia, unspecified: Secondary | ICD-10-CM | POA: Diagnosis not present

## 2015-08-08 DIAGNOSIS — Z992 Dependence on renal dialysis: Secondary | ICD-10-CM | POA: Diagnosis not present

## 2015-08-08 DIAGNOSIS — N2581 Secondary hyperparathyroidism of renal origin: Secondary | ICD-10-CM | POA: Diagnosis not present

## 2015-08-08 DIAGNOSIS — N186 End stage renal disease: Secondary | ICD-10-CM | POA: Diagnosis not present

## 2015-08-11 DIAGNOSIS — Z992 Dependence on renal dialysis: Secondary | ICD-10-CM | POA: Diagnosis not present

## 2015-08-11 DIAGNOSIS — N186 End stage renal disease: Secondary | ICD-10-CM | POA: Diagnosis not present

## 2015-08-11 DIAGNOSIS — N2581 Secondary hyperparathyroidism of renal origin: Secondary | ICD-10-CM | POA: Diagnosis not present

## 2015-08-11 DIAGNOSIS — D509 Iron deficiency anemia, unspecified: Secondary | ICD-10-CM | POA: Diagnosis not present

## 2015-08-11 DIAGNOSIS — D631 Anemia in chronic kidney disease: Secondary | ICD-10-CM | POA: Diagnosis not present

## 2015-08-13 DIAGNOSIS — D509 Iron deficiency anemia, unspecified: Secondary | ICD-10-CM | POA: Diagnosis not present

## 2015-08-13 DIAGNOSIS — Z992 Dependence on renal dialysis: Secondary | ICD-10-CM | POA: Diagnosis not present

## 2015-08-13 DIAGNOSIS — N186 End stage renal disease: Secondary | ICD-10-CM | POA: Diagnosis not present

## 2015-08-13 DIAGNOSIS — N2581 Secondary hyperparathyroidism of renal origin: Secondary | ICD-10-CM | POA: Diagnosis not present

## 2015-08-13 DIAGNOSIS — D631 Anemia in chronic kidney disease: Secondary | ICD-10-CM | POA: Diagnosis not present

## 2015-08-15 DIAGNOSIS — D631 Anemia in chronic kidney disease: Secondary | ICD-10-CM | POA: Diagnosis not present

## 2015-08-15 DIAGNOSIS — N2581 Secondary hyperparathyroidism of renal origin: Secondary | ICD-10-CM | POA: Diagnosis not present

## 2015-08-15 DIAGNOSIS — D509 Iron deficiency anemia, unspecified: Secondary | ICD-10-CM | POA: Diagnosis not present

## 2015-08-15 DIAGNOSIS — N186 End stage renal disease: Secondary | ICD-10-CM | POA: Diagnosis not present

## 2015-08-15 DIAGNOSIS — Z992 Dependence on renal dialysis: Secondary | ICD-10-CM | POA: Diagnosis not present

## 2015-08-18 DIAGNOSIS — Z992 Dependence on renal dialysis: Secondary | ICD-10-CM | POA: Diagnosis not present

## 2015-08-18 DIAGNOSIS — N2581 Secondary hyperparathyroidism of renal origin: Secondary | ICD-10-CM | POA: Diagnosis not present

## 2015-08-18 DIAGNOSIS — N186 End stage renal disease: Secondary | ICD-10-CM | POA: Diagnosis not present

## 2015-08-18 DIAGNOSIS — D509 Iron deficiency anemia, unspecified: Secondary | ICD-10-CM | POA: Diagnosis not present

## 2015-08-18 DIAGNOSIS — D631 Anemia in chronic kidney disease: Secondary | ICD-10-CM | POA: Diagnosis not present

## 2015-08-20 DIAGNOSIS — D509 Iron deficiency anemia, unspecified: Secondary | ICD-10-CM | POA: Diagnosis not present

## 2015-08-20 DIAGNOSIS — N2581 Secondary hyperparathyroidism of renal origin: Secondary | ICD-10-CM | POA: Diagnosis not present

## 2015-08-20 DIAGNOSIS — D631 Anemia in chronic kidney disease: Secondary | ICD-10-CM | POA: Diagnosis not present

## 2015-08-20 DIAGNOSIS — Z992 Dependence on renal dialysis: Secondary | ICD-10-CM | POA: Diagnosis not present

## 2015-08-20 DIAGNOSIS — N186 End stage renal disease: Secondary | ICD-10-CM | POA: Diagnosis not present

## 2015-08-21 ENCOUNTER — Telehealth: Payer: Self-pay

## 2015-08-21 NOTE — Telephone Encounter (Signed)
Pt called to schedule his colonoscopy. He received a triage letter from [facility]. Pt said that he is on dialysis M,W,F.   [phone]

## 2015-08-21 NOTE — Telephone Encounter (Signed)
I called pt and he said his last one was over 10 years ago by Dr. Laural Golden. He is aware that Dr. Laural Golden is not here and he is fine with Dr. Gala Romney. He has no family hx of colon cancer. He does have episodes of diarrhea and also an urgency to get to the bathroom. He has been scheduled an OV with Neil Crouch, PA on 09/11/2015 at 1:30 Pm.

## 2015-08-22 DIAGNOSIS — N186 End stage renal disease: Secondary | ICD-10-CM | POA: Diagnosis not present

## 2015-08-22 DIAGNOSIS — N2581 Secondary hyperparathyroidism of renal origin: Secondary | ICD-10-CM | POA: Diagnosis not present

## 2015-08-22 DIAGNOSIS — D631 Anemia in chronic kidney disease: Secondary | ICD-10-CM | POA: Diagnosis not present

## 2015-08-22 DIAGNOSIS — D509 Iron deficiency anemia, unspecified: Secondary | ICD-10-CM | POA: Diagnosis not present

## 2015-08-22 DIAGNOSIS — Z992 Dependence on renal dialysis: Secondary | ICD-10-CM | POA: Diagnosis not present

## 2015-08-25 DIAGNOSIS — N2581 Secondary hyperparathyroidism of renal origin: Secondary | ICD-10-CM | POA: Diagnosis not present

## 2015-08-25 DIAGNOSIS — D509 Iron deficiency anemia, unspecified: Secondary | ICD-10-CM | POA: Diagnosis not present

## 2015-08-25 DIAGNOSIS — D631 Anemia in chronic kidney disease: Secondary | ICD-10-CM | POA: Diagnosis not present

## 2015-08-25 DIAGNOSIS — Z992 Dependence on renal dialysis: Secondary | ICD-10-CM | POA: Diagnosis not present

## 2015-08-25 DIAGNOSIS — N186 End stage renal disease: Secondary | ICD-10-CM | POA: Diagnosis not present

## 2015-08-27 DIAGNOSIS — N186 End stage renal disease: Secondary | ICD-10-CM | POA: Diagnosis not present

## 2015-08-27 DIAGNOSIS — Z992 Dependence on renal dialysis: Secondary | ICD-10-CM | POA: Diagnosis not present

## 2015-08-27 DIAGNOSIS — N2581 Secondary hyperparathyroidism of renal origin: Secondary | ICD-10-CM | POA: Diagnosis not present

## 2015-08-27 DIAGNOSIS — D631 Anemia in chronic kidney disease: Secondary | ICD-10-CM | POA: Diagnosis not present

## 2015-08-27 DIAGNOSIS — D509 Iron deficiency anemia, unspecified: Secondary | ICD-10-CM | POA: Diagnosis not present

## 2015-08-29 DIAGNOSIS — N186 End stage renal disease: Secondary | ICD-10-CM | POA: Diagnosis not present

## 2015-08-29 DIAGNOSIS — D509 Iron deficiency anemia, unspecified: Secondary | ICD-10-CM | POA: Diagnosis not present

## 2015-08-29 DIAGNOSIS — Z992 Dependence on renal dialysis: Secondary | ICD-10-CM | POA: Diagnosis not present

## 2015-08-29 DIAGNOSIS — N2581 Secondary hyperparathyroidism of renal origin: Secondary | ICD-10-CM | POA: Diagnosis not present

## 2015-08-29 DIAGNOSIS — D631 Anemia in chronic kidney disease: Secondary | ICD-10-CM | POA: Diagnosis not present

## 2015-09-01 DIAGNOSIS — D509 Iron deficiency anemia, unspecified: Secondary | ICD-10-CM | POA: Diagnosis not present

## 2015-09-01 DIAGNOSIS — N186 End stage renal disease: Secondary | ICD-10-CM | POA: Diagnosis not present

## 2015-09-01 DIAGNOSIS — Z992 Dependence on renal dialysis: Secondary | ICD-10-CM | POA: Diagnosis not present

## 2015-09-01 DIAGNOSIS — N2581 Secondary hyperparathyroidism of renal origin: Secondary | ICD-10-CM | POA: Diagnosis not present

## 2015-09-01 DIAGNOSIS — D631 Anemia in chronic kidney disease: Secondary | ICD-10-CM | POA: Diagnosis not present

## 2015-09-03 DIAGNOSIS — Z992 Dependence on renal dialysis: Secondary | ICD-10-CM | POA: Diagnosis not present

## 2015-09-03 DIAGNOSIS — N186 End stage renal disease: Secondary | ICD-10-CM | POA: Diagnosis not present

## 2015-09-03 DIAGNOSIS — D509 Iron deficiency anemia, unspecified: Secondary | ICD-10-CM | POA: Diagnosis not present

## 2015-09-03 DIAGNOSIS — D631 Anemia in chronic kidney disease: Secondary | ICD-10-CM | POA: Diagnosis not present

## 2015-09-03 DIAGNOSIS — N2581 Secondary hyperparathyroidism of renal origin: Secondary | ICD-10-CM | POA: Diagnosis not present

## 2015-09-04 DIAGNOSIS — Z85828 Personal history of other malignant neoplasm of skin: Secondary | ICD-10-CM | POA: Diagnosis not present

## 2015-09-04 DIAGNOSIS — D1801 Hemangioma of skin and subcutaneous tissue: Secondary | ICD-10-CM | POA: Diagnosis not present

## 2015-09-04 DIAGNOSIS — L814 Other melanin hyperpigmentation: Secondary | ICD-10-CM | POA: Diagnosis not present

## 2015-09-04 DIAGNOSIS — D485 Neoplasm of uncertain behavior of skin: Secondary | ICD-10-CM | POA: Diagnosis not present

## 2015-09-04 DIAGNOSIS — Z08 Encounter for follow-up examination after completed treatment for malignant neoplasm: Secondary | ICD-10-CM | POA: Diagnosis not present

## 2015-09-05 DIAGNOSIS — D509 Iron deficiency anemia, unspecified: Secondary | ICD-10-CM | POA: Diagnosis not present

## 2015-09-05 DIAGNOSIS — Z992 Dependence on renal dialysis: Secondary | ICD-10-CM | POA: Diagnosis not present

## 2015-09-05 DIAGNOSIS — D631 Anemia in chronic kidney disease: Secondary | ICD-10-CM | POA: Diagnosis not present

## 2015-09-05 DIAGNOSIS — N2581 Secondary hyperparathyroidism of renal origin: Secondary | ICD-10-CM | POA: Diagnosis not present

## 2015-09-05 DIAGNOSIS — N186 End stage renal disease: Secondary | ICD-10-CM | POA: Diagnosis not present

## 2015-09-07 DIAGNOSIS — N186 End stage renal disease: Secondary | ICD-10-CM | POA: Diagnosis not present

## 2015-09-07 DIAGNOSIS — Z992 Dependence on renal dialysis: Secondary | ICD-10-CM | POA: Diagnosis not present

## 2015-09-08 DIAGNOSIS — N2581 Secondary hyperparathyroidism of renal origin: Secondary | ICD-10-CM | POA: Diagnosis not present

## 2015-09-08 DIAGNOSIS — D631 Anemia in chronic kidney disease: Secondary | ICD-10-CM | POA: Diagnosis not present

## 2015-09-08 DIAGNOSIS — D509 Iron deficiency anemia, unspecified: Secondary | ICD-10-CM | POA: Diagnosis not present

## 2015-09-08 DIAGNOSIS — Z992 Dependence on renal dialysis: Secondary | ICD-10-CM | POA: Diagnosis not present

## 2015-09-08 DIAGNOSIS — N186 End stage renal disease: Secondary | ICD-10-CM | POA: Diagnosis not present

## 2015-09-10 DIAGNOSIS — N2581 Secondary hyperparathyroidism of renal origin: Secondary | ICD-10-CM | POA: Diagnosis not present

## 2015-09-10 DIAGNOSIS — D509 Iron deficiency anemia, unspecified: Secondary | ICD-10-CM | POA: Diagnosis not present

## 2015-09-10 DIAGNOSIS — D631 Anemia in chronic kidney disease: Secondary | ICD-10-CM | POA: Diagnosis not present

## 2015-09-10 DIAGNOSIS — Z992 Dependence on renal dialysis: Secondary | ICD-10-CM | POA: Diagnosis not present

## 2015-09-10 DIAGNOSIS — N186 End stage renal disease: Secondary | ICD-10-CM | POA: Diagnosis not present

## 2015-09-11 ENCOUNTER — Encounter: Payer: Self-pay | Admitting: Gastroenterology

## 2015-09-11 ENCOUNTER — Ambulatory Visit (INDEPENDENT_AMBULATORY_CARE_PROVIDER_SITE_OTHER): Payer: Medicare Other | Admitting: Gastroenterology

## 2015-09-11 VITALS — BP 128/67 | HR 79 | Temp 97.2°F | Ht 67.0 in | Wt 170.6 lb

## 2015-09-11 DIAGNOSIS — R194 Change in bowel habit: Secondary | ICD-10-CM | POA: Diagnosis not present

## 2015-09-11 NOTE — Patient Instructions (Signed)
1. We will review recent labs and get further recommendations from Dr. Gala Romney regarding colonoscopy or any other work up.

## 2015-09-11 NOTE — Assessment & Plan Note (Signed)
82 year old gentleman with 2 year history of change in bowel habits. 2-3 loose stools daily. No other worrisome symptoms. Last colonoscopy 2005, 2 polyps removed, pathology unavailable. We have requested labs for review. Patient is indecisive about pursuing colonoscopy would like Dr. Roseanne Kaufman opinion. Further recommendations to follow.

## 2015-09-11 NOTE — Progress Notes (Addendum)
Primary Care Physician:  Purvis Kilts, MD  Primary Gastroenterologist:  Garfield Cornea, MD   Chief Complaint  Patient presents with  . Diarrhea    Loose stools alot    HPI:  [name] is a 82 y.o. male here At the request of Delman Cheadle, PA-C for screening colonoscopy. Patient gives a two-year history of change in bowel habits. Having 2-3 loose stools daily. Associated with fecal urgency and incontinence if he is not careful. No nocturnal bowel movements. No blood in the stool or melena. Denies abdominal pain or weight loss. He has been on hemodialysis for approximately 2 years, goes on Monday Wednesday and Fridays. Previous history of chronic GERD, none since 2010 after he stopped all of his medications. Denies dysphagia.  Last colonoscopy in 2005 by Dr. Laural Golden. He had a couple of small polyps, pathology unavailable. Diverticulosis and hemorrhoids.    Current Outpatient Prescriptions  Medication Sig Dispense Refill  . Cholecalciferol (VITAMIN D) 1000 UNITS capsule Take 5,000 Units by mouth every morning.     [name] Kitchen lanthanum (FOSRENOL) 1000 MG chewable tablet Chew 1,000 mg by mouth 3 (three) times daily with meals. Takes 1/2 tablet with snacks    . metoprolol succinate (TOPROL-XL) 25 MG 24 hr tablet Take 25 mg by mouth every morning.      No current facility-administered medications for this visit.    Allergies as of 09/11/2015  . (No Known Allergies)    Past Medical History  Diagnosis Date  . BPH (benign prostatic hypertrophy)   . GERD (gastroesophageal reflux disease)   . Hypertension     stress test done - many years ago  . Arthritis     ankle , hip, knees , low back   . Chronic inflammatory demyelinating polyneuropathy ([place]) 04/07/11    ,diagnosed 1992, tx. /w prednisone x 17 yrs. has been d/c for 3 yrs.   [name] Kitchen COPD (chronic obstructive pulmonary disease) ([place])     told that he has "a bit of emphysema"  . Chronic kidney disease        . Cancer ([place])     lung   . Cancer [facility]) 2001    renal  . Kidney calculus   . Dialysis patient [facility])     Past Surgical History  Procedure Laterality Date  . Nephrectomy  2001    left  . Lung removal, partial  2003    partial, right  . Ankle fusion      R ankle  . Cholecystectomy    . Joint replacement      L knee- 2010- [facility], L hip partial replacement   . Av fistula placement  06/01/2011    Procedure: ARTERIOVENOUS (AV) FISTULA CREATION;  Surgeon: Angelia Mould, MD;  Location: [name] OR;  Service: Vascular;  Laterality: Left;  Creation of Left Arteriovenous fistula  . Fistulogram left radial cephalic av fistula  [id number]      Dr. Scot Dock  . Hip arthroplasty Right     "partial"  . Av fistula placement Left 07/12/2012    Procedure: ARTERIOVENOUS (AV) FISTULA CREATION;  Surgeon: Conrad Fredonia, MD;  Location: [place];  Service: Vascular;  Laterality: Left;  . Av fistula placement Left 4.11.14  . Bascilic vein transposition Left 08/28/2012    Procedure: BASCILIC VEIN TRANSPOSITION;  Surgeon: Conrad Beverly Beach, MD;  Location: [place];  Service: Vascular;  Laterality: Left;  left 2nd stage basilic vein transposition  . Fistulogram N/A 08/16/2011    Procedure: FISTULOGRAM;  Surgeon: Angelia Mould, MD;  Location: [facility] CATH LAB;  Service: Cardiovascular;  Laterality: N/A;  . Shuntogram N/A 05/15/2012    Procedure: Earney Mallet;  Surgeon: Conrad Morton, MD;  Location: [facility] CATH LAB;  Service: Cardiovascular;  Laterality: N/A;  . Shuntogram N/A 05/09/2014    Procedure: FISTULOGRAM;  Surgeon: Conrad Fruitridge Pocket, MD;  Location: [facility] CATH LAB;  Service: Cardiovascular;  Laterality: N/A;  . Colonoscopy  2005    Dr. Laural Golden, diverticulosis, hemorrhoids, 2 small polyps    Family History  Problem Relation Age of Onset  . Anesthesia problems Neg Hx   . Hypotension Neg Hx   . Malignant hyperthermia Neg Hx   . Pseudochol deficiency Neg Hx   . Heart disease Mother   . Heart disease Father   . Heart attack Father   . Colon cancer Neg Hx      Social History   Social History  . Marital Status: Married    Spouse Name: N/A  . Number of Children: N/A  . Years of Education: N/A   Occupational History  . Not on file.   Social History Main Topics  . Smoking status: Former Smoker -- 35 years    Types: Cigarettes    Quit date: 02/04/1977  . Smokeless tobacco: Current User    Types: Chew     Comment: Quit in 1978  . Alcohol Use: No     Comment: beer- x2/ mth  . Drug Use: No  . Sexual Activity: Not on file   Other Topics Concern  . Not on file   Social History Narrative      ROS:  General: Negative for anorexia, weight loss, fever, chills, fatigue, weakness. Eyes: Negative for vision changes.  ENT: Negative for hoarseness, difficulty swallowing , nasal congestion. CV: Negative for chest pain, angina, palpitations, dyspnea on exertion, peripheral edema.  Respiratory: Negative for dyspnea at rest, dyspnea on exertion, cough, sputum, wheezing.  GI: See history of present illness. GU:  Negative for dysuria, hematuria, urinary incontinence, urinary frequency, nocturnal urination.  MS: Negative for joint pain, low back pain.  Derm: Negative for rash or itching.  Neuro: Negative for weakness, abnormal sensation, seizure, frequent headaches, memory loss, confusion.  Psych: Negative for anxiety, depression, suicidal ideation, hallucinations.  Endo: Negative for unusual weight change.  Heme: Negative for bruising or bleeding. Allergy: Negative for rash or hives.    Physical Examination:  BP 128/67 mmHg  Pulse 79  Temp(Src) 97.2 F (36.2 C) (Oral)  Ht '5\' 7"'$  (1.702 m)  Wt 170 lb 9.6 oz (77.384 kg)  BMI 26.71 kg/m2   General: Well-nourished, well-developed in no acute distress.  Head: Normocephalic, atraumatic.   Eyes: Conjunctiva pink, no icterus. Mouth: Oropharyngeal mucosa moist and pink , no lesions erythema or exudate. Neck: Supple without thyromegaly, masses, or lymphadenopathy.  Lungs: Clear to  auscultation bilaterally.  Heart: Regular rate and rhythm, no murmurs rubs or gallops.  Abdomen: Bowel sounds are normal, nontender, nondistended, no hepatosplenomegaly or masses, no abdominal bruits or    hernia , no rebound or guarding.   Rectal: defered Extremities: No lower extremity edema. No clubbing or deformities.  Neuro: Alert and oriented x 4 , grossly normal neurologically.  Skin: Warm and dry, no rash or jaundice.   Psych: Alert and cooperative, normal mood and affect.  Labs: requested  Imaging Studies: No results found.

## 2015-09-12 ENCOUNTER — Encounter: Payer: Self-pay | Admitting: Gastroenterology

## 2015-09-12 DIAGNOSIS — D509 Iron deficiency anemia, unspecified: Secondary | ICD-10-CM | POA: Diagnosis not present

## 2015-09-12 DIAGNOSIS — N186 End stage renal disease: Secondary | ICD-10-CM | POA: Diagnosis not present

## 2015-09-12 DIAGNOSIS — D631 Anemia in chronic kidney disease: Secondary | ICD-10-CM | POA: Diagnosis not present

## 2015-09-12 DIAGNOSIS — N2581 Secondary hyperparathyroidism of renal origin: Secondary | ICD-10-CM | POA: Diagnosis not present

## 2015-09-12 DIAGNOSIS — Z992 Dependence on renal dialysis: Secondary | ICD-10-CM | POA: Diagnosis not present

## 2015-09-12 NOTE — Progress Notes (Signed)
cc'ed to pcp °

## 2015-09-12 NOTE — Progress Notes (Signed)
Received fax back from [facility] that most recent labs were in 2015.  Please check with his dialysis center.

## 2015-09-15 DIAGNOSIS — D509 Iron deficiency anemia, unspecified: Secondary | ICD-10-CM | POA: Diagnosis not present

## 2015-09-15 DIAGNOSIS — Z992 Dependence on renal dialysis: Secondary | ICD-10-CM | POA: Diagnosis not present

## 2015-09-15 DIAGNOSIS — D631 Anemia in chronic kidney disease: Secondary | ICD-10-CM | POA: Diagnosis not present

## 2015-09-15 DIAGNOSIS — N2581 Secondary hyperparathyroidism of renal origin: Secondary | ICD-10-CM | POA: Diagnosis not present

## 2015-09-15 DIAGNOSIS — N186 End stage renal disease: Secondary | ICD-10-CM | POA: Diagnosis not present

## 2015-09-15 NOTE — Progress Notes (Signed)
I re faxed the release of records to dialysis.

## 2015-09-17 DIAGNOSIS — N2581 Secondary hyperparathyroidism of renal origin: Secondary | ICD-10-CM | POA: Diagnosis not present

## 2015-09-17 DIAGNOSIS — D509 Iron deficiency anemia, unspecified: Secondary | ICD-10-CM | POA: Diagnosis not present

## 2015-09-17 DIAGNOSIS — D631 Anemia in chronic kidney disease: Secondary | ICD-10-CM | POA: Diagnosis not present

## 2015-09-17 DIAGNOSIS — N186 End stage renal disease: Secondary | ICD-10-CM | POA: Diagnosis not present

## 2015-09-17 DIAGNOSIS — Z992 Dependence on renal dialysis: Secondary | ICD-10-CM | POA: Diagnosis not present

## 2015-09-19 DIAGNOSIS — N186 End stage renal disease: Secondary | ICD-10-CM | POA: Diagnosis not present

## 2015-09-19 DIAGNOSIS — Z992 Dependence on renal dialysis: Secondary | ICD-10-CM | POA: Diagnosis not present

## 2015-09-19 DIAGNOSIS — N2581 Secondary hyperparathyroidism of renal origin: Secondary | ICD-10-CM | POA: Diagnosis not present

## 2015-09-19 DIAGNOSIS — D631 Anemia in chronic kidney disease: Secondary | ICD-10-CM | POA: Diagnosis not present

## 2015-09-19 DIAGNOSIS — D509 Iron deficiency anemia, unspecified: Secondary | ICD-10-CM | POA: Diagnosis not present

## 2015-09-22 DIAGNOSIS — N186 End stage renal disease: Secondary | ICD-10-CM | POA: Diagnosis not present

## 2015-09-22 DIAGNOSIS — N2581 Secondary hyperparathyroidism of renal origin: Secondary | ICD-10-CM | POA: Diagnosis not present

## 2015-09-22 DIAGNOSIS — D509 Iron deficiency anemia, unspecified: Secondary | ICD-10-CM | POA: Diagnosis not present

## 2015-09-22 DIAGNOSIS — Z992 Dependence on renal dialysis: Secondary | ICD-10-CM | POA: Diagnosis not present

## 2015-09-22 DIAGNOSIS — D631 Anemia in chronic kidney disease: Secondary | ICD-10-CM | POA: Diagnosis not present

## 2015-09-24 DIAGNOSIS — Z992 Dependence on renal dialysis: Secondary | ICD-10-CM | POA: Diagnosis not present

## 2015-09-24 DIAGNOSIS — D631 Anemia in chronic kidney disease: Secondary | ICD-10-CM | POA: Diagnosis not present

## 2015-09-24 DIAGNOSIS — N2581 Secondary hyperparathyroidism of renal origin: Secondary | ICD-10-CM | POA: Diagnosis not present

## 2015-09-24 DIAGNOSIS — D509 Iron deficiency anemia, unspecified: Secondary | ICD-10-CM | POA: Diagnosis not present

## 2015-09-24 DIAGNOSIS — N186 End stage renal disease: Secondary | ICD-10-CM | POA: Diagnosis not present

## 2015-09-26 DIAGNOSIS — N186 End stage renal disease: Secondary | ICD-10-CM | POA: Diagnosis not present

## 2015-09-26 DIAGNOSIS — D509 Iron deficiency anemia, unspecified: Secondary | ICD-10-CM | POA: Diagnosis not present

## 2015-09-26 DIAGNOSIS — Z992 Dependence on renal dialysis: Secondary | ICD-10-CM | POA: Diagnosis not present

## 2015-09-26 DIAGNOSIS — N2581 Secondary hyperparathyroidism of renal origin: Secondary | ICD-10-CM | POA: Diagnosis not present

## 2015-09-26 DIAGNOSIS — D631 Anemia in chronic kidney disease: Secondary | ICD-10-CM | POA: Diagnosis not present

## 2015-09-29 DIAGNOSIS — N186 End stage renal disease: Secondary | ICD-10-CM | POA: Diagnosis not present

## 2015-09-29 DIAGNOSIS — N2581 Secondary hyperparathyroidism of renal origin: Secondary | ICD-10-CM | POA: Diagnosis not present

## 2015-09-29 DIAGNOSIS — D509 Iron deficiency anemia, unspecified: Secondary | ICD-10-CM | POA: Diagnosis not present

## 2015-09-29 DIAGNOSIS — D631 Anemia in chronic kidney disease: Secondary | ICD-10-CM | POA: Diagnosis not present

## 2015-09-29 DIAGNOSIS — Z992 Dependence on renal dialysis: Secondary | ICD-10-CM | POA: Diagnosis not present

## 2015-10-01 DIAGNOSIS — N2581 Secondary hyperparathyroidism of renal origin: Secondary | ICD-10-CM | POA: Diagnosis not present

## 2015-10-01 DIAGNOSIS — Z992 Dependence on renal dialysis: Secondary | ICD-10-CM | POA: Diagnosis not present

## 2015-10-01 DIAGNOSIS — D631 Anemia in chronic kidney disease: Secondary | ICD-10-CM | POA: Diagnosis not present

## 2015-10-01 DIAGNOSIS — D509 Iron deficiency anemia, unspecified: Secondary | ICD-10-CM | POA: Diagnosis not present

## 2015-10-01 DIAGNOSIS — N186 End stage renal disease: Secondary | ICD-10-CM | POA: Diagnosis not present

## 2015-10-03 DIAGNOSIS — N186 End stage renal disease: Secondary | ICD-10-CM | POA: Diagnosis not present

## 2015-10-03 DIAGNOSIS — D631 Anemia in chronic kidney disease: Secondary | ICD-10-CM | POA: Diagnosis not present

## 2015-10-03 DIAGNOSIS — Z992 Dependence on renal dialysis: Secondary | ICD-10-CM | POA: Diagnosis not present

## 2015-10-03 DIAGNOSIS — D509 Iron deficiency anemia, unspecified: Secondary | ICD-10-CM | POA: Diagnosis not present

## 2015-10-03 DIAGNOSIS — N2581 Secondary hyperparathyroidism of renal origin: Secondary | ICD-10-CM | POA: Diagnosis not present

## 2015-10-06 DIAGNOSIS — N2581 Secondary hyperparathyroidism of renal origin: Secondary | ICD-10-CM | POA: Diagnosis not present

## 2015-10-06 DIAGNOSIS — D631 Anemia in chronic kidney disease: Secondary | ICD-10-CM | POA: Diagnosis not present

## 2015-10-06 DIAGNOSIS — Z992 Dependence on renal dialysis: Secondary | ICD-10-CM | POA: Diagnosis not present

## 2015-10-06 DIAGNOSIS — D509 Iron deficiency anemia, unspecified: Secondary | ICD-10-CM | POA: Diagnosis not present

## 2015-10-06 DIAGNOSIS — N186 End stage renal disease: Secondary | ICD-10-CM | POA: Diagnosis not present

## 2015-10-08 DIAGNOSIS — Z992 Dependence on renal dialysis: Secondary | ICD-10-CM | POA: Diagnosis not present

## 2015-10-08 DIAGNOSIS — D509 Iron deficiency anemia, unspecified: Secondary | ICD-10-CM | POA: Diagnosis not present

## 2015-10-08 DIAGNOSIS — N2581 Secondary hyperparathyroidism of renal origin: Secondary | ICD-10-CM | POA: Diagnosis not present

## 2015-10-08 DIAGNOSIS — D631 Anemia in chronic kidney disease: Secondary | ICD-10-CM | POA: Diagnosis not present

## 2015-10-08 DIAGNOSIS — N186 End stage renal disease: Secondary | ICD-10-CM | POA: Diagnosis not present

## 2015-10-10 DIAGNOSIS — N186 End stage renal disease: Secondary | ICD-10-CM | POA: Diagnosis not present

## 2015-10-10 DIAGNOSIS — Z992 Dependence on renal dialysis: Secondary | ICD-10-CM | POA: Diagnosis not present

## 2015-10-10 DIAGNOSIS — N2581 Secondary hyperparathyroidism of renal origin: Secondary | ICD-10-CM | POA: Diagnosis not present

## 2015-10-10 DIAGNOSIS — D509 Iron deficiency anemia, unspecified: Secondary | ICD-10-CM | POA: Diagnosis not present

## 2015-10-13 DIAGNOSIS — N2581 Secondary hyperparathyroidism of renal origin: Secondary | ICD-10-CM | POA: Diagnosis not present

## 2015-10-13 DIAGNOSIS — D509 Iron deficiency anemia, unspecified: Secondary | ICD-10-CM | POA: Diagnosis not present

## 2015-10-13 DIAGNOSIS — N186 End stage renal disease: Secondary | ICD-10-CM | POA: Diagnosis not present

## 2015-10-13 DIAGNOSIS — Z992 Dependence on renal dialysis: Secondary | ICD-10-CM | POA: Diagnosis not present

## 2015-10-15 DIAGNOSIS — Z992 Dependence on renal dialysis: Secondary | ICD-10-CM | POA: Diagnosis not present

## 2015-10-15 DIAGNOSIS — N2581 Secondary hyperparathyroidism of renal origin: Secondary | ICD-10-CM | POA: Diagnosis not present

## 2015-10-15 DIAGNOSIS — N186 End stage renal disease: Secondary | ICD-10-CM | POA: Diagnosis not present

## 2015-10-15 DIAGNOSIS — D509 Iron deficiency anemia, unspecified: Secondary | ICD-10-CM | POA: Diagnosis not present

## 2015-10-17 DIAGNOSIS — N2581 Secondary hyperparathyroidism of renal origin: Secondary | ICD-10-CM | POA: Diagnosis not present

## 2015-10-17 DIAGNOSIS — D509 Iron deficiency anemia, unspecified: Secondary | ICD-10-CM | POA: Diagnosis not present

## 2015-10-17 DIAGNOSIS — N186 End stage renal disease: Secondary | ICD-10-CM | POA: Diagnosis not present

## 2015-10-17 DIAGNOSIS — Z992 Dependence on renal dialysis: Secondary | ICD-10-CM | POA: Diagnosis not present

## 2015-10-20 DIAGNOSIS — Z992 Dependence on renal dialysis: Secondary | ICD-10-CM | POA: Diagnosis not present

## 2015-10-20 DIAGNOSIS — N186 End stage renal disease: Secondary | ICD-10-CM | POA: Diagnosis not present

## 2015-10-20 DIAGNOSIS — N2581 Secondary hyperparathyroidism of renal origin: Secondary | ICD-10-CM | POA: Diagnosis not present

## 2015-10-20 DIAGNOSIS — D509 Iron deficiency anemia, unspecified: Secondary | ICD-10-CM | POA: Diagnosis not present

## 2015-10-22 DIAGNOSIS — N2581 Secondary hyperparathyroidism of renal origin: Secondary | ICD-10-CM | POA: Diagnosis not present

## 2015-10-22 DIAGNOSIS — D509 Iron deficiency anemia, unspecified: Secondary | ICD-10-CM | POA: Diagnosis not present

## 2015-10-22 DIAGNOSIS — Z992 Dependence on renal dialysis: Secondary | ICD-10-CM | POA: Diagnosis not present

## 2015-10-22 DIAGNOSIS — N186 End stage renal disease: Secondary | ICD-10-CM | POA: Diagnosis not present

## 2015-10-24 DIAGNOSIS — D509 Iron deficiency anemia, unspecified: Secondary | ICD-10-CM | POA: Diagnosis not present

## 2015-10-24 DIAGNOSIS — N2581 Secondary hyperparathyroidism of renal origin: Secondary | ICD-10-CM | POA: Diagnosis not present

## 2015-10-24 DIAGNOSIS — N186 End stage renal disease: Secondary | ICD-10-CM | POA: Diagnosis not present

## 2015-10-24 DIAGNOSIS — Z992 Dependence on renal dialysis: Secondary | ICD-10-CM | POA: Diagnosis not present

## 2015-10-27 DIAGNOSIS — N2581 Secondary hyperparathyroidism of renal origin: Secondary | ICD-10-CM | POA: Diagnosis not present

## 2015-10-27 DIAGNOSIS — D509 Iron deficiency anemia, unspecified: Secondary | ICD-10-CM | POA: Diagnosis not present

## 2015-10-27 DIAGNOSIS — N186 End stage renal disease: Secondary | ICD-10-CM | POA: Diagnosis not present

## 2015-10-27 DIAGNOSIS — Z992 Dependence on renal dialysis: Secondary | ICD-10-CM | POA: Diagnosis not present

## 2015-10-29 DIAGNOSIS — N2581 Secondary hyperparathyroidism of renal origin: Secondary | ICD-10-CM | POA: Diagnosis not present

## 2015-10-29 DIAGNOSIS — N186 End stage renal disease: Secondary | ICD-10-CM | POA: Diagnosis not present

## 2015-10-29 DIAGNOSIS — D509 Iron deficiency anemia, unspecified: Secondary | ICD-10-CM | POA: Diagnosis not present

## 2015-10-29 DIAGNOSIS — Z992 Dependence on renal dialysis: Secondary | ICD-10-CM | POA: Diagnosis not present

## 2015-10-31 DIAGNOSIS — Z992 Dependence on renal dialysis: Secondary | ICD-10-CM | POA: Diagnosis not present

## 2015-10-31 DIAGNOSIS — N186 End stage renal disease: Secondary | ICD-10-CM | POA: Diagnosis not present

## 2015-10-31 DIAGNOSIS — D509 Iron deficiency anemia, unspecified: Secondary | ICD-10-CM | POA: Diagnosis not present

## 2015-10-31 DIAGNOSIS — N2581 Secondary hyperparathyroidism of renal origin: Secondary | ICD-10-CM | POA: Diagnosis not present

## 2015-11-03 DIAGNOSIS — Z992 Dependence on renal dialysis: Secondary | ICD-10-CM | POA: Diagnosis not present

## 2015-11-03 DIAGNOSIS — N186 End stage renal disease: Secondary | ICD-10-CM | POA: Diagnosis not present

## 2015-11-03 DIAGNOSIS — N2581 Secondary hyperparathyroidism of renal origin: Secondary | ICD-10-CM | POA: Diagnosis not present

## 2015-11-03 DIAGNOSIS — D509 Iron deficiency anemia, unspecified: Secondary | ICD-10-CM | POA: Diagnosis not present

## 2015-11-05 DIAGNOSIS — N2581 Secondary hyperparathyroidism of renal origin: Secondary | ICD-10-CM | POA: Diagnosis not present

## 2015-11-05 DIAGNOSIS — Z992 Dependence on renal dialysis: Secondary | ICD-10-CM | POA: Diagnosis not present

## 2015-11-05 DIAGNOSIS — N186 End stage renal disease: Secondary | ICD-10-CM | POA: Diagnosis not present

## 2015-11-05 DIAGNOSIS — D509 Iron deficiency anemia, unspecified: Secondary | ICD-10-CM | POA: Diagnosis not present

## 2015-11-07 DIAGNOSIS — N2581 Secondary hyperparathyroidism of renal origin: Secondary | ICD-10-CM | POA: Diagnosis not present

## 2015-11-07 DIAGNOSIS — Z992 Dependence on renal dialysis: Secondary | ICD-10-CM | POA: Diagnosis not present

## 2015-11-07 DIAGNOSIS — D509 Iron deficiency anemia, unspecified: Secondary | ICD-10-CM | POA: Diagnosis not present

## 2015-11-07 DIAGNOSIS — N186 End stage renal disease: Secondary | ICD-10-CM | POA: Diagnosis not present

## 2015-11-10 DIAGNOSIS — N2581 Secondary hyperparathyroidism of renal origin: Secondary | ICD-10-CM | POA: Diagnosis not present

## 2015-11-10 DIAGNOSIS — D509 Iron deficiency anemia, unspecified: Secondary | ICD-10-CM | POA: Diagnosis not present

## 2015-11-10 DIAGNOSIS — D631 Anemia in chronic kidney disease: Secondary | ICD-10-CM | POA: Diagnosis not present

## 2015-11-10 DIAGNOSIS — Z992 Dependence on renal dialysis: Secondary | ICD-10-CM | POA: Diagnosis not present

## 2015-11-10 DIAGNOSIS — N186 End stage renal disease: Secondary | ICD-10-CM | POA: Diagnosis not present

## 2015-11-12 DIAGNOSIS — Z992 Dependence on renal dialysis: Secondary | ICD-10-CM | POA: Diagnosis not present

## 2015-11-12 DIAGNOSIS — D631 Anemia in chronic kidney disease: Secondary | ICD-10-CM | POA: Diagnosis not present

## 2015-11-12 DIAGNOSIS — D509 Iron deficiency anemia, unspecified: Secondary | ICD-10-CM | POA: Diagnosis not present

## 2015-11-12 DIAGNOSIS — N186 End stage renal disease: Secondary | ICD-10-CM | POA: Diagnosis not present

## 2015-11-12 DIAGNOSIS — N2581 Secondary hyperparathyroidism of renal origin: Secondary | ICD-10-CM | POA: Diagnosis not present

## 2015-11-14 DIAGNOSIS — D509 Iron deficiency anemia, unspecified: Secondary | ICD-10-CM | POA: Diagnosis not present

## 2015-11-14 DIAGNOSIS — D631 Anemia in chronic kidney disease: Secondary | ICD-10-CM | POA: Diagnosis not present

## 2015-11-14 DIAGNOSIS — N186 End stage renal disease: Secondary | ICD-10-CM | POA: Diagnosis not present

## 2015-11-14 DIAGNOSIS — Z992 Dependence on renal dialysis: Secondary | ICD-10-CM | POA: Diagnosis not present

## 2015-11-14 DIAGNOSIS — N2581 Secondary hyperparathyroidism of renal origin: Secondary | ICD-10-CM | POA: Diagnosis not present

## 2015-11-17 DIAGNOSIS — N2581 Secondary hyperparathyroidism of renal origin: Secondary | ICD-10-CM | POA: Diagnosis not present

## 2015-11-17 DIAGNOSIS — Z992 Dependence on renal dialysis: Secondary | ICD-10-CM | POA: Diagnosis not present

## 2015-11-17 DIAGNOSIS — D509 Iron deficiency anemia, unspecified: Secondary | ICD-10-CM | POA: Diagnosis not present

## 2015-11-17 DIAGNOSIS — D631 Anemia in chronic kidney disease: Secondary | ICD-10-CM | POA: Diagnosis not present

## 2015-11-17 DIAGNOSIS — N186 End stage renal disease: Secondary | ICD-10-CM | POA: Diagnosis not present

## 2015-11-19 DIAGNOSIS — N2581 Secondary hyperparathyroidism of renal origin: Secondary | ICD-10-CM | POA: Diagnosis not present

## 2015-11-19 DIAGNOSIS — D631 Anemia in chronic kidney disease: Secondary | ICD-10-CM | POA: Diagnosis not present

## 2015-11-19 DIAGNOSIS — D509 Iron deficiency anemia, unspecified: Secondary | ICD-10-CM | POA: Diagnosis not present

## 2015-11-19 DIAGNOSIS — Z992 Dependence on renal dialysis: Secondary | ICD-10-CM | POA: Diagnosis not present

## 2015-11-19 DIAGNOSIS — N186 End stage renal disease: Secondary | ICD-10-CM | POA: Diagnosis not present

## 2015-11-21 DIAGNOSIS — N186 End stage renal disease: Secondary | ICD-10-CM | POA: Diagnosis not present

## 2015-11-21 DIAGNOSIS — D509 Iron deficiency anemia, unspecified: Secondary | ICD-10-CM | POA: Diagnosis not present

## 2015-11-21 DIAGNOSIS — D631 Anemia in chronic kidney disease: Secondary | ICD-10-CM | POA: Diagnosis not present

## 2015-11-21 DIAGNOSIS — N2581 Secondary hyperparathyroidism of renal origin: Secondary | ICD-10-CM | POA: Diagnosis not present

## 2015-11-21 DIAGNOSIS — Z992 Dependence on renal dialysis: Secondary | ICD-10-CM | POA: Diagnosis not present

## 2015-11-24 DIAGNOSIS — D631 Anemia in chronic kidney disease: Secondary | ICD-10-CM | POA: Diagnosis not present

## 2015-11-24 DIAGNOSIS — Z6825 Body mass index (BMI) 25.0-25.9, adult: Secondary | ICD-10-CM | POA: Diagnosis not present

## 2015-11-24 DIAGNOSIS — Z992 Dependence on renal dialysis: Secondary | ICD-10-CM | POA: Diagnosis not present

## 2015-11-24 DIAGNOSIS — D179 Benign lipomatous neoplasm, unspecified: Secondary | ICD-10-CM | POA: Diagnosis not present

## 2015-11-24 DIAGNOSIS — D509 Iron deficiency anemia, unspecified: Secondary | ICD-10-CM | POA: Diagnosis not present

## 2015-11-24 DIAGNOSIS — N186 End stage renal disease: Secondary | ICD-10-CM | POA: Diagnosis not present

## 2015-11-24 DIAGNOSIS — L723 Sebaceous cyst: Secondary | ICD-10-CM | POA: Diagnosis not present

## 2015-11-24 DIAGNOSIS — N2581 Secondary hyperparathyroidism of renal origin: Secondary | ICD-10-CM | POA: Diagnosis not present

## 2015-11-24 DIAGNOSIS — E663 Overweight: Secondary | ICD-10-CM | POA: Diagnosis not present

## 2015-11-26 DIAGNOSIS — D509 Iron deficiency anemia, unspecified: Secondary | ICD-10-CM | POA: Diagnosis not present

## 2015-11-26 DIAGNOSIS — Z992 Dependence on renal dialysis: Secondary | ICD-10-CM | POA: Diagnosis not present

## 2015-11-26 DIAGNOSIS — N2581 Secondary hyperparathyroidism of renal origin: Secondary | ICD-10-CM | POA: Diagnosis not present

## 2015-11-26 DIAGNOSIS — D631 Anemia in chronic kidney disease: Secondary | ICD-10-CM | POA: Diagnosis not present

## 2015-11-26 DIAGNOSIS — N186 End stage renal disease: Secondary | ICD-10-CM | POA: Diagnosis not present

## 2015-11-28 DIAGNOSIS — Z992 Dependence on renal dialysis: Secondary | ICD-10-CM | POA: Diagnosis not present

## 2015-11-28 DIAGNOSIS — N186 End stage renal disease: Secondary | ICD-10-CM | POA: Diagnosis not present

## 2015-11-28 DIAGNOSIS — N2581 Secondary hyperparathyroidism of renal origin: Secondary | ICD-10-CM | POA: Diagnosis not present

## 2015-11-28 DIAGNOSIS — D509 Iron deficiency anemia, unspecified: Secondary | ICD-10-CM | POA: Diagnosis not present

## 2015-11-28 DIAGNOSIS — D631 Anemia in chronic kidney disease: Secondary | ICD-10-CM | POA: Diagnosis not present

## 2015-11-30 ENCOUNTER — Encounter (HOSPITAL_COMMUNITY): Payer: Self-pay | Admitting: Emergency Medicine

## 2015-11-30 ENCOUNTER — Emergency Department (HOSPITAL_COMMUNITY)
Admission: EM | Admit: 2015-11-30 | Discharge: 2015-11-30 | Disposition: A | Discharge status: Home / Self Care | Payer: Medicare Other | Attending: Emergency Medicine | Admitting: Emergency Medicine

## 2015-11-30 DIAGNOSIS — I120 Hypertensive chronic kidney disease with stage 5 chronic kidney disease or end stage renal disease: Secondary | ICD-10-CM | POA: Insufficient documentation

## 2015-11-30 DIAGNOSIS — L02211 Cutaneous abscess of abdominal wall: Secondary | ICD-10-CM | POA: Diagnosis present

## 2015-11-30 DIAGNOSIS — Z87891 Personal history of nicotine dependence: Secondary | ICD-10-CM | POA: Insufficient documentation

## 2015-11-30 DIAGNOSIS — J449 Chronic obstructive pulmonary disease, unspecified: Secondary | ICD-10-CM | POA: Insufficient documentation

## 2015-11-30 DIAGNOSIS — Z992 Dependence on renal dialysis: Secondary | ICD-10-CM | POA: Insufficient documentation

## 2015-11-30 DIAGNOSIS — Z96652 Presence of left artificial knee joint: Secondary | ICD-10-CM | POA: Diagnosis not present

## 2015-11-30 DIAGNOSIS — N186 End stage renal disease: Secondary | ICD-10-CM | POA: Diagnosis not present

## 2015-11-30 DIAGNOSIS — Z85118 Personal history of other malignant neoplasm of bronchus and lung: Secondary | ICD-10-CM | POA: Insufficient documentation

## 2015-11-30 DIAGNOSIS — L723 Sebaceous cyst: Secondary | ICD-10-CM | POA: Diagnosis not present

## 2015-11-30 DIAGNOSIS — Z85528 Personal history of other malignant neoplasm of kidney: Secondary | ICD-10-CM | POA: Diagnosis not present

## 2015-11-30 MED ORDER — LIDOCAINE HCL (PF) 2 % IJ SOLN
10.0000 mL | Freq: Once | INTRAMUSCULAR | Status: AC
  Administered 2015-11-30: 10 mL via INTRADERMAL
  Filled 2015-11-30: qty 10

## 2015-11-30 NOTE — ED Notes (Signed)
Suture tray and lidocaine at bedside. PA aware.

## 2015-11-30 NOTE — ED Triage Notes (Signed)
Patient c/o wound/abscess to left flank. Patient states "It's been there a long time, started like a little bump under skin but now it's gotten bigger and is starting to drain." Patient has gauze dressing over abscess. Patient reports going to PCP and receiving an antibiotic on Monday. Patient taking Septra Ds. Patient states tried to go to PCP on Wednesday but office never responded.

## 2015-11-30 NOTE — ED Provider Notes (Signed)
[place] DEPT Provider Note   CSN: [id number] Arrival date & time: 11/30/15  4098  First Provider Contact:  11/30/2015  8:01 am      History   Chief Complaint Chief Complaint  Patient presents with  . Abscess    HPI [name] is a 82 y.o. male who complains of a painful mass to his left flank area.  He states the mass has been present for months but became larger and more painful for two weeks.  He was seen by his PCP 6 days ago and started on Bactrim twice daily.  He notes the area began draining and bleeding yesterday.  He states he was told that he needs to follow-up with a surgeon and he is waiting for an appt that his PCP is suppose to schedule.  He denies fever, chills, vomiting or diarrhea.     HPI  Past Medical History:  Diagnosis Date  . Arthritis    ankle , hip, knees , low back   . BPH (benign prostatic hypertrophy)   . Cancer ([place])    lung  . Cancer ([place]) 2001   renal  . Chronic inflammatory demyelinating polyneuropathy ([place]) 04/07/11   ,diagnosed 1992, tx. /w prednisone x 17 yrs. has been d/c for 3 yrs.   . Chronic kidney disease       . COPD (chronic obstructive pulmonary disease) ([place])    told that he has "a bit of emphysema"  . Dialysis patient ([place])   . GERD (gastroesophageal reflux disease)   . Hypertension    stress test done - many years ago  . Kidney calculus     Patient Active Problem List   Diagnosis Date Noted  . Bowel habit changes 09/11/2015  . Non-small cell cancer of left lung ([place]) 10/27/2012  . Visit for wound check 10/09/2012  . Swollen arm 09/15/2012  . Encounter for adequacy testing for hemodialysis ([place]) 09/01/2012  . Aftercare following surgery of the circulatory system, [place] 08/18/2012  . Other complications due to renal dialysis device, implant, and graft 04/19/2012  . End stage renal disease ([place]) 07/14/2011  . Pre-operative cardiovascular examination 04/07/2011    Past Surgical History:  Procedure Laterality  Date  . ANKLE FUSION     R ankle  . AV FISTULA PLACEMENT  06/01/2011   Procedure: ARTERIOVENOUS (AV) FISTULA CREATION;  Surgeon: Angelia Mould, MD;  Location: [facility] OR;  Service: Vascular;  Laterality: Left;  Creation of Left Arteriovenous fistula  . AV FISTULA PLACEMENT Left 07/12/2012   Procedure: ARTERIOVENOUS (AV) FISTULA CREATION;  Surgeon: Conrad Sperryville, MD;  Location: [place];  Service: Vascular;  Laterality: Left;  . AV FISTULA PLACEMENT Left 4.11.14  . BASCILIC VEIN TRANSPOSITION Left 08/28/2012   Procedure: BASCILIC VEIN TRANSPOSITION;  Surgeon: Conrad Dell, MD;  Location: [place];  Service: Vascular;  Laterality: Left;  left 2nd stage basilic vein transposition  . CHOLECYSTECTOMY    . COLONOSCOPY  2005   Dr. Laural Golden, diverticulosis, hemorrhoids, 2 small polyps  . FISTULOGRAM N/A 08/16/2011   Procedure: FISTULOGRAM;  Surgeon: Angelia Mould, MD;  Location: [facility] CATH LAB;  Service: Cardiovascular;  Laterality: N/A;  . fistulogram left radial cephalic AV fistula  [id number]     Dr. Scot Dock  . HIP ARTHROPLASTY Right    "partial"  . JOINT REPLACEMENT     L knee- 2010- [facility], L hip partial replacement   . LUNG REMOVAL, PARTIAL  2003   partial, right  . NEPHRECTOMY  2001   left  . SHUNTOGRAM N/A 05/15/2012   Procedure: Earney Mallet;  Surgeon: Conrad Varnell, MD;  Location: [facility] CATH LAB;  Service: Cardiovascular;  Laterality: N/A;  . SHUNTOGRAM N/A 05/09/2014   Procedure: FISTULOGRAM;  Surgeon: Conrad Ramblewood, MD;  Location: [facility] CATH LAB;  Service: Cardiovascular;  Laterality: N/A;       Home Medications    Prior to Admission medications   Medication Sig Start Date End Date Taking? Authorizing Provider  Cholecalciferol (VITAMIN D) 1000 UNITS capsule Take 5,000 Units by mouth every morning.     Historical Provider, MD  lanthanum (FOSRENOL) 1000 MG chewable tablet Chew 1,000 mg by mouth 3 (three) times daily with meals. Takes 1/2 tablet with snacks    Historical Provider, MD  metoprolol  succinate (TOPROL-XL) 25 MG 24 hr tablet Take 25 mg by mouth every morning.     Historical Provider, MD    Family History Family History  Problem Relation Age of Onset  . Heart disease Mother   . Heart disease Father   . Heart attack Father   . Anesthesia problems Neg Hx   . Hypotension Neg Hx   . Malignant hyperthermia Neg Hx   . Pseudochol deficiency Neg Hx   . Colon cancer Neg Hx     Social History Social History  Substance Use Topics  . Smoking status: Former Smoker    Years: 35.00    Types: Cigarettes    Quit date: 02/04/1977  . Smokeless tobacco: Current User    Types: Chew     Comment: Quit in 1978  . Alcohol use No     Allergies   Review of patient's allergies indicates no known allergies.   Review of Systems Review of Systems  Constitutional: Negative for chills and fever.  Gastrointestinal: Negative for nausea and vomiting.  Musculoskeletal: Negative for arthralgias and joint swelling.  Skin: Positive for color change.       Abscess   Hematological: Negative for adenopathy.  All other systems reviewed and are negative.    Physical Exam Updated Vital Signs BP 136/66 (BP Location: Right Arm)   Pulse 72   Temp 97.7 F (36.5 C) (Oral)   Resp 18   Ht '5\' 7"'$  (1.702 m)   Wt 78.5 kg   SpO2 96%   BMI 27.10 kg/m   Physical Exam  Constitutional: He is oriented to person, place, and time. He appears well-developed and well-nourished. No distress.  HENT:  Head: Normocephalic.  Cardiovascular: Normal rate and regular rhythm.   Pulmonary/Chest: Effort normal. No respiratory distress.  Abdominal: Soft. He exhibits no mass. There is no tenderness. There is no guarding.  Musculoskeletal: Normal range of motion.  Neurological: He is alert and oriented to person, place, and time.  Skin: Skin is warm.  2 cm area of induration to left flank with slight purulent drainage and scant amt of blood.  Mild surrounding erythema  Psychiatric: He has a normal mood and  affect.  Nursing note and vitals reviewed.    ED Treatments / Results  Labs (all labs ordered are listed, but only abnormal results are displayed) Labs Reviewed - No data to display  EKG  EKG Interpretation None       Radiology No results found.  Procedures Procedures (including critical care time)   INCISION AND DRAINAGE Performed by: Hale Bogus. Consent: Verbal consent obtained. Risks and benefits: risks, benefits and alternatives were discussed Type: abscess  Body area: left flank  Anesthesia: local  infiltration  Incision was made with a #11 scalpel.  Local anesthetic: lidocaine 2 % w/o epinephrine  Anesthetic total: 3 ml  Complexity: complex Blunt dissection to break up loculations  Drainage: purulent  Drainage amount: moderate  Packing material: 1/4 in iodoform gauze  Patient tolerance: Patient tolerated the procedure well with no immediate complications.    Medications Ordered in ED Medications  lidocaine (XYLOCAINE) 2 % injection 10 mL (not administered)     Initial Impression / Assessment and Plan / ED Course  I have reviewed the triage vital signs and the nursing notes.  Pertinent labs & imaging results that were available during my care of the patient were reviewed by me and considered in my medical decision making (see chart for details).  Clinical Course   Patient is well-appearing. Vital signs are stable. Patient has sebaceous cyst to left flank with surrounding erythema. He is currently taking Bactrim. I&D was performed, patient will continue his Bactrim as directed. I have advised him to return here in 2 days for packing removal and to follow-up with general surgery.  patient also seen by Dr. Eulis Foster and care plan discussed.  Final Clinical Impressions(s) / ED Diagnoses   Final diagnoses:  Sebaceous cyst    New Prescriptions New Prescriptions   No medications on file     Kem Parkinson, PA-C 11/30/15 [place], PA-C 11/30/15 2146    Daleen Bo, MD 12/01/15 1728

## 2015-11-30 NOTE — ED Notes (Signed)
Patient with no complaints at this time. Respirations even and unlabored. Skin warm/dry. Discharge instructions reviewed with patient at this time. Patient given opportunity to voice concerns/ask questions. Patient discharged at this time and left Emergency Department with steady gait.   

## 2015-12-01 DIAGNOSIS — Z992 Dependence on renal dialysis: Secondary | ICD-10-CM | POA: Diagnosis not present

## 2015-12-01 DIAGNOSIS — N186 End stage renal disease: Secondary | ICD-10-CM | POA: Diagnosis not present

## 2015-12-01 DIAGNOSIS — N2581 Secondary hyperparathyroidism of renal origin: Secondary | ICD-10-CM | POA: Diagnosis not present

## 2015-12-01 DIAGNOSIS — D509 Iron deficiency anemia, unspecified: Secondary | ICD-10-CM | POA: Diagnosis not present

## 2015-12-01 DIAGNOSIS — D631 Anemia in chronic kidney disease: Secondary | ICD-10-CM | POA: Diagnosis not present

## 2015-12-02 ENCOUNTER — Telehealth: Payer: Self-pay | Admitting: Gastroenterology

## 2015-12-02 DIAGNOSIS — L723 Sebaceous cyst: Secondary | ICD-10-CM | POA: Diagnosis not present

## 2015-12-02 NOTE — Telephone Encounter (Signed)
Tried to call patient and lady said he was busy. Asked for return call. I wanted to update him. We have tried to get labs from PCP and/or dialysis center to see if there is any IDA that would support a need for a colonoscopy but have been unsuccessful.  Based on what we do know, patient's age, multiple comorbidities, a vague change in bowel habits, Dr. Gala Romney does not advise a colonoscopy. Patient was on the fence about having one done anyways during his office visit.  Please let patient know or I would be glad to talk to him. We can offer him a follow-up to discuss with Dr. Gala Romney if patient feels like he desires that.

## 2015-12-03 DIAGNOSIS — N186 End stage renal disease: Secondary | ICD-10-CM | POA: Diagnosis not present

## 2015-12-03 DIAGNOSIS — D509 Iron deficiency anemia, unspecified: Secondary | ICD-10-CM | POA: Diagnosis not present

## 2015-12-03 DIAGNOSIS — D631 Anemia in chronic kidney disease: Secondary | ICD-10-CM | POA: Diagnosis not present

## 2015-12-03 DIAGNOSIS — Z992 Dependence on renal dialysis: Secondary | ICD-10-CM | POA: Diagnosis not present

## 2015-12-03 DIAGNOSIS — N2581 Secondary hyperparathyroidism of renal origin: Secondary | ICD-10-CM | POA: Diagnosis not present

## 2015-12-05 DIAGNOSIS — Z992 Dependence on renal dialysis: Secondary | ICD-10-CM | POA: Diagnosis not present

## 2015-12-05 DIAGNOSIS — N2581 Secondary hyperparathyroidism of renal origin: Secondary | ICD-10-CM | POA: Diagnosis not present

## 2015-12-05 DIAGNOSIS — D509 Iron deficiency anemia, unspecified: Secondary | ICD-10-CM | POA: Diagnosis not present

## 2015-12-05 DIAGNOSIS — N186 End stage renal disease: Secondary | ICD-10-CM | POA: Diagnosis not present

## 2015-12-05 DIAGNOSIS — D631 Anemia in chronic kidney disease: Secondary | ICD-10-CM | POA: Diagnosis not present

## 2015-12-08 DIAGNOSIS — N186 End stage renal disease: Secondary | ICD-10-CM | POA: Diagnosis not present

## 2015-12-08 DIAGNOSIS — N2581 Secondary hyperparathyroidism of renal origin: Secondary | ICD-10-CM | POA: Diagnosis not present

## 2015-12-08 DIAGNOSIS — D631 Anemia in chronic kidney disease: Secondary | ICD-10-CM | POA: Diagnosis not present

## 2015-12-08 DIAGNOSIS — Z992 Dependence on renal dialysis: Secondary | ICD-10-CM | POA: Diagnosis not present

## 2015-12-08 DIAGNOSIS — D509 Iron deficiency anemia, unspecified: Secondary | ICD-10-CM | POA: Diagnosis not present

## 2015-12-09 NOTE — Telephone Encounter (Signed)
Pt is aware. He would like for [facility]SL to call him next week. He is aware that she is out of town this week.

## 2015-12-10 DIAGNOSIS — N186 End stage renal disease: Secondary | ICD-10-CM | POA: Diagnosis not present

## 2015-12-10 DIAGNOSIS — D509 Iron deficiency anemia, unspecified: Secondary | ICD-10-CM | POA: Diagnosis not present

## 2015-12-10 DIAGNOSIS — Z992 Dependence on renal dialysis: Secondary | ICD-10-CM | POA: Diagnosis not present

## 2015-12-10 DIAGNOSIS — N2581 Secondary hyperparathyroidism of renal origin: Secondary | ICD-10-CM | POA: Diagnosis not present

## 2015-12-10 DIAGNOSIS — D631 Anemia in chronic kidney disease: Secondary | ICD-10-CM | POA: Diagnosis not present

## 2015-12-12 DIAGNOSIS — N2581 Secondary hyperparathyroidism of renal origin: Secondary | ICD-10-CM | POA: Diagnosis not present

## 2015-12-12 DIAGNOSIS — D509 Iron deficiency anemia, unspecified: Secondary | ICD-10-CM | POA: Diagnosis not present

## 2015-12-12 DIAGNOSIS — N186 End stage renal disease: Secondary | ICD-10-CM | POA: Diagnosis not present

## 2015-12-12 DIAGNOSIS — D631 Anemia in chronic kidney disease: Secondary | ICD-10-CM | POA: Diagnosis not present

## 2015-12-12 DIAGNOSIS — Z992 Dependence on renal dialysis: Secondary | ICD-10-CM | POA: Diagnosis not present

## 2015-12-15 DIAGNOSIS — Z992 Dependence on renal dialysis: Secondary | ICD-10-CM | POA: Diagnosis not present

## 2015-12-15 DIAGNOSIS — D631 Anemia in chronic kidney disease: Secondary | ICD-10-CM | POA: Diagnosis not present

## 2015-12-15 DIAGNOSIS — N186 End stage renal disease: Secondary | ICD-10-CM | POA: Diagnosis not present

## 2015-12-15 DIAGNOSIS — D509 Iron deficiency anemia, unspecified: Secondary | ICD-10-CM | POA: Diagnosis not present

## 2015-12-15 DIAGNOSIS — N2581 Secondary hyperparathyroidism of renal origin: Secondary | ICD-10-CM | POA: Diagnosis not present

## 2015-12-15 NOTE — Telephone Encounter (Signed)
Spoke with patient and he is in agreement and doesn't want to have the colonoscopy done.

## 2015-12-17 DIAGNOSIS — Z992 Dependence on renal dialysis: Secondary | ICD-10-CM | POA: Diagnosis not present

## 2015-12-17 DIAGNOSIS — N2581 Secondary hyperparathyroidism of renal origin: Secondary | ICD-10-CM | POA: Diagnosis not present

## 2015-12-17 DIAGNOSIS — N186 End stage renal disease: Secondary | ICD-10-CM | POA: Diagnosis not present

## 2015-12-17 DIAGNOSIS — D509 Iron deficiency anemia, unspecified: Secondary | ICD-10-CM | POA: Diagnosis not present

## 2015-12-17 DIAGNOSIS — D631 Anemia in chronic kidney disease: Secondary | ICD-10-CM | POA: Diagnosis not present

## 2015-12-19 DIAGNOSIS — D631 Anemia in chronic kidney disease: Secondary | ICD-10-CM | POA: Diagnosis not present

## 2015-12-19 DIAGNOSIS — N186 End stage renal disease: Secondary | ICD-10-CM | POA: Diagnosis not present

## 2015-12-19 DIAGNOSIS — Z992 Dependence on renal dialysis: Secondary | ICD-10-CM | POA: Diagnosis not present

## 2015-12-19 DIAGNOSIS — N2581 Secondary hyperparathyroidism of renal origin: Secondary | ICD-10-CM | POA: Diagnosis not present

## 2015-12-19 DIAGNOSIS — D509 Iron deficiency anemia, unspecified: Secondary | ICD-10-CM | POA: Diagnosis not present

## 2015-12-22 DIAGNOSIS — D509 Iron deficiency anemia, unspecified: Secondary | ICD-10-CM | POA: Diagnosis not present

## 2015-12-22 DIAGNOSIS — N2581 Secondary hyperparathyroidism of renal origin: Secondary | ICD-10-CM | POA: Diagnosis not present

## 2015-12-22 DIAGNOSIS — D631 Anemia in chronic kidney disease: Secondary | ICD-10-CM | POA: Diagnosis not present

## 2015-12-22 DIAGNOSIS — N186 End stage renal disease: Secondary | ICD-10-CM | POA: Diagnosis not present

## 2015-12-22 DIAGNOSIS — Z992 Dependence on renal dialysis: Secondary | ICD-10-CM | POA: Diagnosis not present

## 2015-12-24 DIAGNOSIS — N186 End stage renal disease: Secondary | ICD-10-CM | POA: Diagnosis not present

## 2015-12-24 DIAGNOSIS — D631 Anemia in chronic kidney disease: Secondary | ICD-10-CM | POA: Diagnosis not present

## 2015-12-24 DIAGNOSIS — Z992 Dependence on renal dialysis: Secondary | ICD-10-CM | POA: Diagnosis not present

## 2015-12-24 DIAGNOSIS — D509 Iron deficiency anemia, unspecified: Secondary | ICD-10-CM | POA: Diagnosis not present

## 2015-12-24 DIAGNOSIS — N2581 Secondary hyperparathyroidism of renal origin: Secondary | ICD-10-CM | POA: Diagnosis not present

## 2015-12-26 DIAGNOSIS — Z992 Dependence on renal dialysis: Secondary | ICD-10-CM | POA: Diagnosis not present

## 2015-12-26 DIAGNOSIS — D509 Iron deficiency anemia, unspecified: Secondary | ICD-10-CM | POA: Diagnosis not present

## 2015-12-26 DIAGNOSIS — N2581 Secondary hyperparathyroidism of renal origin: Secondary | ICD-10-CM | POA: Diagnosis not present

## 2015-12-26 DIAGNOSIS — N186 End stage renal disease: Secondary | ICD-10-CM | POA: Diagnosis not present

## 2015-12-26 DIAGNOSIS — D631 Anemia in chronic kidney disease: Secondary | ICD-10-CM | POA: Diagnosis not present

## 2015-12-29 DIAGNOSIS — D631 Anemia in chronic kidney disease: Secondary | ICD-10-CM | POA: Diagnosis not present

## 2015-12-29 DIAGNOSIS — N2581 Secondary hyperparathyroidism of renal origin: Secondary | ICD-10-CM | POA: Diagnosis not present

## 2015-12-29 DIAGNOSIS — N186 End stage renal disease: Secondary | ICD-10-CM | POA: Diagnosis not present

## 2015-12-29 DIAGNOSIS — D509 Iron deficiency anemia, unspecified: Secondary | ICD-10-CM | POA: Diagnosis not present

## 2015-12-29 DIAGNOSIS — Z992 Dependence on renal dialysis: Secondary | ICD-10-CM | POA: Diagnosis not present

## 2015-12-31 DIAGNOSIS — Z992 Dependence on renal dialysis: Secondary | ICD-10-CM | POA: Diagnosis not present

## 2015-12-31 DIAGNOSIS — N186 End stage renal disease: Secondary | ICD-10-CM | POA: Diagnosis not present

## 2015-12-31 DIAGNOSIS — D631 Anemia in chronic kidney disease: Secondary | ICD-10-CM | POA: Diagnosis not present

## 2015-12-31 DIAGNOSIS — D509 Iron deficiency anemia, unspecified: Secondary | ICD-10-CM | POA: Diagnosis not present

## 2015-12-31 DIAGNOSIS — N2581 Secondary hyperparathyroidism of renal origin: Secondary | ICD-10-CM | POA: Diagnosis not present

## 2016-01-01 ENCOUNTER — Telehealth (HOSPITAL_COMMUNITY): Payer: Self-pay | Admitting: *Deleted

## 2016-01-02 ENCOUNTER — Ambulatory Visit (HOSPITAL_COMMUNITY): Payer: Medicare Other

## 2016-01-02 DIAGNOSIS — D509 Iron deficiency anemia, unspecified: Secondary | ICD-10-CM | POA: Diagnosis not present

## 2016-01-02 DIAGNOSIS — Z992 Dependence on renal dialysis: Secondary | ICD-10-CM | POA: Diagnosis not present

## 2016-01-02 DIAGNOSIS — N186 End stage renal disease: Secondary | ICD-10-CM | POA: Diagnosis not present

## 2016-01-02 DIAGNOSIS — D631 Anemia in chronic kidney disease: Secondary | ICD-10-CM | POA: Diagnosis not present

## 2016-01-02 DIAGNOSIS — N2581 Secondary hyperparathyroidism of renal origin: Secondary | ICD-10-CM | POA: Diagnosis not present

## 2016-01-02 NOTE — Telephone Encounter (Signed)
Per Dr. Whitney Muse and tom- we cannot order this because he is not our patient. Attempted to call pat and explain he needs to get PCP or current oncologist to order or make an appt here. Unable to reach or leave a message. Will try later.

## 2016-01-05 ENCOUNTER — Other Ambulatory Visit (HOSPITAL_COMMUNITY): Payer: Self-pay

## 2016-01-05 DIAGNOSIS — D631 Anemia in chronic kidney disease: Secondary | ICD-10-CM | POA: Diagnosis not present

## 2016-01-05 DIAGNOSIS — N2581 Secondary hyperparathyroidism of renal origin: Secondary | ICD-10-CM | POA: Diagnosis not present

## 2016-01-05 DIAGNOSIS — D509 Iron deficiency anemia, unspecified: Secondary | ICD-10-CM | POA: Diagnosis not present

## 2016-01-05 DIAGNOSIS — N186 End stage renal disease: Secondary | ICD-10-CM | POA: Diagnosis not present

## 2016-01-05 DIAGNOSIS — Z992 Dependence on renal dialysis: Secondary | ICD-10-CM | POA: Diagnosis not present

## 2016-01-07 DIAGNOSIS — N186 End stage renal disease: Secondary | ICD-10-CM | POA: Diagnosis not present

## 2016-01-07 DIAGNOSIS — Z992 Dependence on renal dialysis: Secondary | ICD-10-CM | POA: Diagnosis not present

## 2016-01-07 DIAGNOSIS — D631 Anemia in chronic kidney disease: Secondary | ICD-10-CM | POA: Diagnosis not present

## 2016-01-07 DIAGNOSIS — N2581 Secondary hyperparathyroidism of renal origin: Secondary | ICD-10-CM | POA: Diagnosis not present

## 2016-01-07 DIAGNOSIS — D509 Iron deficiency anemia, unspecified: Secondary | ICD-10-CM | POA: Diagnosis not present

## 2016-01-08 DIAGNOSIS — Z992 Dependence on renal dialysis: Secondary | ICD-10-CM | POA: Diagnosis not present

## 2016-01-08 DIAGNOSIS — N186 End stage renal disease: Secondary | ICD-10-CM | POA: Diagnosis not present

## 2016-01-09 DIAGNOSIS — N186 End stage renal disease: Secondary | ICD-10-CM | POA: Diagnosis not present

## 2016-01-09 DIAGNOSIS — D631 Anemia in chronic kidney disease: Secondary | ICD-10-CM | POA: Diagnosis not present

## 2016-01-09 DIAGNOSIS — Z992 Dependence on renal dialysis: Secondary | ICD-10-CM | POA: Diagnosis not present

## 2016-01-09 DIAGNOSIS — D509 Iron deficiency anemia, unspecified: Secondary | ICD-10-CM | POA: Diagnosis not present

## 2016-01-09 DIAGNOSIS — N2581 Secondary hyperparathyroidism of renal origin: Secondary | ICD-10-CM | POA: Diagnosis not present

## 2016-01-09 NOTE — Telephone Encounter (Signed)
Attempted to notify patient that he has an appointment on 02/04/16 at 1pm. Unable to reach or leave a message. Will try later.

## 2016-01-09 NOTE — Telephone Encounter (Signed)
Got in touch with patient. Gave him appt. Date and time. He states he has dialysis M-W-F but thinks he can make it to the 1pm appointment. He will call if he needs to reschedule.

## 2016-01-12 DIAGNOSIS — Z992 Dependence on renal dialysis: Secondary | ICD-10-CM | POA: Diagnosis not present

## 2016-01-12 DIAGNOSIS — D631 Anemia in chronic kidney disease: Secondary | ICD-10-CM | POA: Diagnosis not present

## 2016-01-12 DIAGNOSIS — N186 End stage renal disease: Secondary | ICD-10-CM | POA: Diagnosis not present

## 2016-01-12 DIAGNOSIS — N2581 Secondary hyperparathyroidism of renal origin: Secondary | ICD-10-CM | POA: Diagnosis not present

## 2016-01-12 DIAGNOSIS — D509 Iron deficiency anemia, unspecified: Secondary | ICD-10-CM | POA: Diagnosis not present

## 2016-01-14 DIAGNOSIS — N2581 Secondary hyperparathyroidism of renal origin: Secondary | ICD-10-CM | POA: Diagnosis not present

## 2016-01-14 DIAGNOSIS — D509 Iron deficiency anemia, unspecified: Secondary | ICD-10-CM | POA: Diagnosis not present

## 2016-01-14 DIAGNOSIS — Z992 Dependence on renal dialysis: Secondary | ICD-10-CM | POA: Diagnosis not present

## 2016-01-14 DIAGNOSIS — D631 Anemia in chronic kidney disease: Secondary | ICD-10-CM | POA: Diagnosis not present

## 2016-01-14 DIAGNOSIS — N186 End stage renal disease: Secondary | ICD-10-CM | POA: Diagnosis not present

## 2016-01-16 DIAGNOSIS — N186 End stage renal disease: Secondary | ICD-10-CM | POA: Diagnosis not present

## 2016-01-16 DIAGNOSIS — Z992 Dependence on renal dialysis: Secondary | ICD-10-CM | POA: Diagnosis not present

## 2016-01-16 DIAGNOSIS — N2581 Secondary hyperparathyroidism of renal origin: Secondary | ICD-10-CM | POA: Diagnosis not present

## 2016-01-16 DIAGNOSIS — D509 Iron deficiency anemia, unspecified: Secondary | ICD-10-CM | POA: Diagnosis not present

## 2016-01-16 DIAGNOSIS — D631 Anemia in chronic kidney disease: Secondary | ICD-10-CM | POA: Diagnosis not present

## 2016-01-19 DIAGNOSIS — N186 End stage renal disease: Secondary | ICD-10-CM | POA: Diagnosis not present

## 2016-01-19 DIAGNOSIS — Z992 Dependence on renal dialysis: Secondary | ICD-10-CM | POA: Diagnosis not present

## 2016-01-19 DIAGNOSIS — D631 Anemia in chronic kidney disease: Secondary | ICD-10-CM | POA: Diagnosis not present

## 2016-01-19 DIAGNOSIS — D509 Iron deficiency anemia, unspecified: Secondary | ICD-10-CM | POA: Diagnosis not present

## 2016-01-19 DIAGNOSIS — N2581 Secondary hyperparathyroidism of renal origin: Secondary | ICD-10-CM | POA: Diagnosis not present

## 2016-01-21 DIAGNOSIS — N2581 Secondary hyperparathyroidism of renal origin: Secondary | ICD-10-CM | POA: Diagnosis not present

## 2016-01-21 DIAGNOSIS — D631 Anemia in chronic kidney disease: Secondary | ICD-10-CM | POA: Diagnosis not present

## 2016-01-21 DIAGNOSIS — D509 Iron deficiency anemia, unspecified: Secondary | ICD-10-CM | POA: Diagnosis not present

## 2016-01-21 DIAGNOSIS — Z992 Dependence on renal dialysis: Secondary | ICD-10-CM | POA: Diagnosis not present

## 2016-01-21 DIAGNOSIS — N186 End stage renal disease: Secondary | ICD-10-CM | POA: Diagnosis not present

## 2016-01-23 DIAGNOSIS — N2581 Secondary hyperparathyroidism of renal origin: Secondary | ICD-10-CM | POA: Diagnosis not present

## 2016-01-23 DIAGNOSIS — D509 Iron deficiency anemia, unspecified: Secondary | ICD-10-CM | POA: Diagnosis not present

## 2016-01-23 DIAGNOSIS — D631 Anemia in chronic kidney disease: Secondary | ICD-10-CM | POA: Diagnosis not present

## 2016-01-23 DIAGNOSIS — N186 End stage renal disease: Secondary | ICD-10-CM | POA: Diagnosis not present

## 2016-01-23 DIAGNOSIS — Z992 Dependence on renal dialysis: Secondary | ICD-10-CM | POA: Diagnosis not present

## 2016-01-26 DIAGNOSIS — N186 End stage renal disease: Secondary | ICD-10-CM | POA: Diagnosis not present

## 2016-01-26 DIAGNOSIS — Z992 Dependence on renal dialysis: Secondary | ICD-10-CM | POA: Diagnosis not present

## 2016-01-26 DIAGNOSIS — D631 Anemia in chronic kidney disease: Secondary | ICD-10-CM | POA: Diagnosis not present

## 2016-01-26 DIAGNOSIS — N2581 Secondary hyperparathyroidism of renal origin: Secondary | ICD-10-CM | POA: Diagnosis not present

## 2016-01-26 DIAGNOSIS — D509 Iron deficiency anemia, unspecified: Secondary | ICD-10-CM | POA: Diagnosis not present

## 2016-01-28 DIAGNOSIS — N2581 Secondary hyperparathyroidism of renal origin: Secondary | ICD-10-CM | POA: Diagnosis not present

## 2016-01-28 DIAGNOSIS — N186 End stage renal disease: Secondary | ICD-10-CM | POA: Diagnosis not present

## 2016-01-28 DIAGNOSIS — Z992 Dependence on renal dialysis: Secondary | ICD-10-CM | POA: Diagnosis not present

## 2016-01-28 DIAGNOSIS — D631 Anemia in chronic kidney disease: Secondary | ICD-10-CM | POA: Diagnosis not present

## 2016-01-28 DIAGNOSIS — D509 Iron deficiency anemia, unspecified: Secondary | ICD-10-CM | POA: Diagnosis not present

## 2016-01-30 DIAGNOSIS — D509 Iron deficiency anemia, unspecified: Secondary | ICD-10-CM | POA: Diagnosis not present

## 2016-01-30 DIAGNOSIS — N2581 Secondary hyperparathyroidism of renal origin: Secondary | ICD-10-CM | POA: Diagnosis not present

## 2016-01-30 DIAGNOSIS — N186 End stage renal disease: Secondary | ICD-10-CM | POA: Diagnosis not present

## 2016-01-30 DIAGNOSIS — D631 Anemia in chronic kidney disease: Secondary | ICD-10-CM | POA: Diagnosis not present

## 2016-01-30 DIAGNOSIS — Z992 Dependence on renal dialysis: Secondary | ICD-10-CM | POA: Diagnosis not present

## 2016-02-02 DIAGNOSIS — Z992 Dependence on renal dialysis: Secondary | ICD-10-CM | POA: Diagnosis not present

## 2016-02-02 DIAGNOSIS — D631 Anemia in chronic kidney disease: Secondary | ICD-10-CM | POA: Diagnosis not present

## 2016-02-02 DIAGNOSIS — N186 End stage renal disease: Secondary | ICD-10-CM | POA: Diagnosis not present

## 2016-02-02 DIAGNOSIS — D509 Iron deficiency anemia, unspecified: Secondary | ICD-10-CM | POA: Diagnosis not present

## 2016-02-02 DIAGNOSIS — N2581 Secondary hyperparathyroidism of renal origin: Secondary | ICD-10-CM | POA: Diagnosis not present

## 2016-02-04 ENCOUNTER — Encounter (HOSPITAL_COMMUNITY): Payer: Self-pay | Admitting: Hematology

## 2016-02-04 ENCOUNTER — Encounter (HOSPITAL_COMMUNITY): Payer: Medicare Other | Attending: Hematology | Admitting: Hematology

## 2016-02-04 VITALS — BP 147/58 | HR 67 | Temp 98.2°F | Resp 20 | Ht 67.0 in | Wt 170.0 lb

## 2016-02-04 DIAGNOSIS — C3412 Malignant neoplasm of upper lobe, left bronchus or lung: Secondary | ICD-10-CM | POA: Diagnosis not present

## 2016-02-04 DIAGNOSIS — C3490 Malignant neoplasm of unspecified part of unspecified bronchus or lung: Secondary | ICD-10-CM

## 2016-02-04 DIAGNOSIS — N186 End stage renal disease: Secondary | ICD-10-CM

## 2016-02-04 DIAGNOSIS — N2581 Secondary hyperparathyroidism of renal origin: Secondary | ICD-10-CM | POA: Diagnosis not present

## 2016-02-04 DIAGNOSIS — Z85528 Personal history of other malignant neoplasm of kidney: Secondary | ICD-10-CM

## 2016-02-04 DIAGNOSIS — D509 Iron deficiency anemia, unspecified: Secondary | ICD-10-CM | POA: Diagnosis not present

## 2016-02-04 DIAGNOSIS — D631 Anemia in chronic kidney disease: Secondary | ICD-10-CM | POA: Diagnosis not present

## 2016-02-04 DIAGNOSIS — Z992 Dependence on renal dialysis: Secondary | ICD-10-CM | POA: Diagnosis not present

## 2016-02-04 NOTE — Progress Notes (Signed)
Per Dr. Irene Limbo, pt's dialysis center called to request recent copy of labs.  Labs to be faxed to [facility].

## 2016-02-04 NOTE — Progress Notes (Signed)
[name] Kitchen    HEMATOLOGY/ONCOLOGY CONSULTATION NOTE  Date of Service: 02/04/2016  Patient Care Team: Sharilyn Sites, MD as PCP - General (Family Medicine) Fran Lowes, MD as Attending Physician (Nephrology) Daryll Brod, MD as Consulting Physician (Orthopedic Surgery) Conrad Guinica, MD as Consulting Physician (Vascular Surgery) Daneil Dolin, MD as Consulting Physician (Gastroenterology)  CHIEF COMPLAINTS/PURPOSE OF CONSULTATION:  H/o Lung cancer H/o Kidney cancer  HISTORY OF PRESENTING ILLNESS:   [name] is a wonderful 82 y.o. male who has been referred to [place] by Dr .Hilma Favors, Betsy Coder, MD  for evaluation and followup for his h/o lung cancer.  Patient has a history of chronic inflammatory  Demyelinating neuropathy which was diagnosed in 1992 and which needed treatment with prednisone for more than 17 years.  He reports that he presented with abdominal discomfort and nausea in 2001 and had an ultrasound of the abdomen to rule out a gallbladder problem and was incidentally noted to have a right kidney renal cell carcinoma for which he had a right nephrectomy on 08/31/1999. He has not had any concerns for recurrent/metastatic renal cell carcinoma since then.  He has since had renal failure with nephrotic syndrome and has been on hemodialysis since the last few years.  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 Arlyce Dice. Patient has been getting monitored postoperatively and thus far has not had a recurrence of his lung cancer. He was being followed by Dr. Everardo All and was last seen in clinic on 12/30/2015. He had a CT of the chest on 12/31/2015 which showed a 1 mm increase in the size of now a 3 mm left lower lobe lung parenchymal nodule. He has several other bilateral  lung nodules which have otherwise been stable.  Patient notes no change in breathing since his last clinic visit. He notes he has a little bit of a postnasal drip and chronic cough which has not changed. No hemoptysis. No fever no chills no night sweats no unexpected weight loss. No new bone pains. No focal neurological deficits or headaches. Patient has no clear new focal symptoms.  MEDICAL HISTORY:   Past Medical History:  Diagnosis Date  . Arthritis    ankle , hip, knees , low back   . BPH (benign prostatic hypertrophy)   . Cancer ([place])    lung  . Cancer ([place]) 2001   renal  . Chronic inflammatory demyelinating polyneuropathy ([place]) 04/07/11   ,diagnosed 1992, tx. /w prednisone x 17 yrs. has been d/c for 3 yrs.   . Chronic kidney disease       . COPD (chronic obstructive pulmonary disease) ([place])    told that he has "a bit of emphysema"  . Dialysis patient ([place])   . GERD (gastroesophageal reflux disease)   . Hypertension    stress test done - many years ago  . Kidney calculus    SURGICAL HISTORY:  Past Surgical History:  Procedure Laterality Date  . ANKLE FUSION     R ankle  . AV FISTULA PLACEMENT  06/01/2011   Procedure: ARTERIOVENOUS (AV) FISTULA CREATION;  Surgeon: Angelia Mould, MD;  Location[facility] OR;  Service: Vascular;  Laterality: Left;  Creation of Left Arteriovenous fistula  . AV FISTULA PLACEMENT Left 07/12/2012  Procedure: ARTERIOVENOUS (AV) FISTULA CREATION;  Surgeon: Conrad Isleton, MD;  Location: [facility];  Service: Vascular;  Laterality: Left;  . AV FISTULA PLACEMENT Left 4.11.14  . BASCILIC VEIN TRANSPOSITION Left 08/28/2012   Procedure: BASCILIC VEIN TRANSPOSITION;  Surgeon: Conrad [place], MD;  Location: [facility];  Service: Vascular;  Laterality: Left;  left 2nd stage basilic vein transposition  . CHOLECYSTECTOMY    . COLONOSCOPY  2005   Dr. Laural Golden, diverticulosis, hemorrhoids, 2 small polyps  . FISTULOGRAM N/A 08/16/2011   Procedure: FISTULOGRAM;  Surgeon:  Angelia Mould, MD;  Location: [facility] CATH LAB;  Service: Cardiovascular;  Laterality: N/A;  . fistulogram left radial cephalic AV fistula  [id number]     Dr. Scot Dock  . HIP ARTHROPLASTY Right    "partial"  . JOINT REPLACEMENT     L knee- 2010- [facility], L hip partial replacement   . LUNG REMOVAL, PARTIAL  2003   partial, right  . NEPHRECTOMY  2001   left  . SHUNTOGRAM N/A 05/15/2012   Procedure: Earney Mallet;  Surgeon: Conrad El Dara, MD;  Location: [facility] CATH LAB;  Service: Cardiovascular;  Laterality: N/A;  . SHUNTOGRAM N/A 05/09/2014   Procedure: FISTULOGRAM;  Surgeon: Conrad [place], MD;  Location: [facility] CATH LAB;  Service: Cardiovascular;  Laterality: N/A;    SOCIAL HISTORY: Social History   Social History  . Marital status: Married    Spouse name: N/A  . Number of children: N/A  . Years of education: N/A   Occupational History  . Not on file.   Social History Main Topics  . Smoking status: Former Smoker    Years: 35.00    Types: Cigarettes    Quit date: 02/04/1977  . Smokeless tobacco: Current User    Types: Chew     Comment: Quit in 1978  . Alcohol use No  . Drug use: No  . Sexual activity: Not on file   Other Topics Concern  . Not on file   Social History Narrative  . No narrative on file    FAMILY HISTORY: Family History  Problem Relation Age of Onset  . Heart disease Mother   . Heart disease Father   . Heart attack Father   . Anesthesia problems Neg Hx   . Hypotension Neg Hx   . Malignant hyperthermia Neg Hx   . Pseudochol deficiency Neg Hx   . Colon cancer Neg Hx     ALLERGIES:  has No Known Allergies.  MEDICATIONS:  Current Outpatient Prescriptions  Medication Sig Dispense Refill  . Cholecalciferol (VITAMIN D) 1000 UNITS capsule Take 5,000 Units by mouth every morning.     [name] Kitchen lanthanum (FOSRENOL) 1000 MG chewable tablet Chew 1,000 mg by mouth 3 (three) times daily with meals. Takes 1/2 tablet with snacks    . metoprolol succinate (TOPROL-XL) 25 MG 24 hr  tablet Take 25 mg by mouth every morning.      No current facility-administered medications for this visit.     REVIEW OF SYSTEMS:    10 Point review of Systems was done is negative except as noted above.  PHYSICAL EXAMINATION: ECOG PERFORMANCE STATUS: 2 - Symptomatic, <50% confined to bed  . Vitals:   02/04/16 1300  BP: (!) 147/58  Pulse: 67  Resp: 20  Temp: 98.2 F (36.8 C)   Filed Weights   02/04/16 1300  Weight: 170 lb (77.1 kg)   .Body mass index is 26.63 kg/m.  GENERAL:alert, in no acute distress and comfortable SKIN:  skin color, texture, turgor are normal, no rashes or significant lesions EYES: normal, conjunctiva are pink and non-injected, sclera clear OROPHARYNX:no exudate, no erythema and lips, buccal mucosa, and tongue normal  NECK: supple, no JVD, thyroid normal size, non-tender, without nodularity LYMPH:  no palpable lymphadenopathy in the cervical, axillary or inguinal LUNGS: Decreased air entry bilaterally, no rales scattered rhonchi HEART: regular rate & rhythm,  trace pedal edema bilaterally  ABDOMEN: abdomen soft, non-tender, normoactive bowel sounds and no palpable hepatosplenomegaly. Musculoskeletal: no cyanosis of digits and no clubbing  PSYCH: alert & oriented x 3 with fluent speech NEURO: no focal motor/sensory deficits  LABORATORY DATA:  I have reviewed the data as listed  . CBC Latest Ref Rng & Units 05/09/2014 08/29/2012 08/28/2012  WBC 4.0 - 10.5 K/uL - 9.3 -  Hemoglobin 13.0 - 17.0 g/dL 11.2(L) 9.3(L) 9.5(L)  Hematocrit 39.0 - 52.0 % 33.0(L) 27.6(L) 28.0(L)  Platelets 150 - 400 K/uL - 160 -    . CMP Latest Ref Rng & Units 05/09/2014 10/26/2013 08/29/2012  Glucose 70 - 99 mg/dL 104(H) - 119(H)  BUN 6 - 23 mg/dL 36(H) - 60(H)  Creatinine 0.50 - 1.35 mg/dL 5.60(H) - 5.29(H)  Sodium 135 - 145 mmol/L 139 - 136  Potassium 3.5 - 5.1 mmol/L 3.9 - 4.6  Chloride 96 - 112 mEq/L 100 - 104  CO2 19 - 32 mEq/L - - 20  Calcium 8.4 - 10.5 mg/dL - -  8.3(L)  Total Protein 6.0 - 8.3 g/dL - 6.0 -  Total Bilirubin 0.3 - 1.2 mg/dL - - -  Alkaline Phos 39 - 117 U/L - - -  AST 0 - 37 U/L - - -  ALT 0 - 53 U/L - - -     RADIOGRAPHIC STUDIES: I have personally reviewed the radiological images as listed and agreed with the findings in the report.  Ct chest 12/31/2015: CT Chest Wo Contrast (Accession [id number]) (Order [id number])  Imaging  Date: 12/31/2014 Department: [facility] IMAGING Released By: Brett Fairy Authorizing: Everardo All, MD  PACS Images   Show images for CT Chest Wo Contrast  Study Result   CLINICAL DATA:  Lung cancer, restaging. Surgery 2003. History of renal cell carcinoma status post nephrectomy 2001. Remote smoker.  EXAM: CT CHEST WITHOUT CONTRAST  TECHNIQUE: Multidetector CT imaging of the chest was performed following the standard protocol without IV contrast.  COMPARISON:  10/26/2013 chest CT  FINDINGS: Mediastinum/Nodes: Moderate atheromatous aortic and coronary arterial calcifications. Heart size normal. No pericardial effusion. No lymphadenopathy. Thyroid grossly normal.  Lungs/Pleura: Right apical scarring is stable, image 7. 4 mm right upper lobe pulmonary parenchymal nodule image 16 is stable.  3 mm right upper lobe nodule image 25 is stable.  2 mm probable intrapulmonary lymph node abutting the fissure image 29 is stable.  Within the right lower lobe, 5 and 6 mm nodules are stable allowing for differences in technique, image 51. 4 mm right lower lobe pulmonary nodule image 49 is stable.  Evidence of a left upper lobectomy without mass lesion at the resection margin. 3 mm left lower lobe pulmonary nodule and adjacent 2 mm left lower lobe pulmonary nodule image 39 noted, but the 3 mm nodule measuring slightly larger than the 2015 prior exam and subjectively slightly larger than 2014 prior exam.  Upper abdomen: Evidence of right nephrectomy without mass at the resection  margin. Cholecystectomy clips are noted. Lobulated left renal contour reidentified but incompletely characterized.  Musculoskeletal: No acute  osseous abnormality or lytic or sclerotic lesion. Disc degenerative change noted.  IMPRESSION: 1 mm increase in size of now 3 mm left lower lobe pulmonary parenchymal nodule. Otherwise stable nodules as described above. This is amenable to continued follow-up at future restaging exams.  No acute abnormality.   Electronically Signed   By: Conchita Paris M.D.   On: 12/31/2014 16:55    ASSESSMENT & PLAN:   82 year old Caucasian gentleman with   #1 history of left upper lobe bronchoalveolar carcinoma stage I  - T1 and N0 M0. Diagnosed in April 2003 status post left upper lobectomy by Dr. Arlyce Dice.  Patient has no overt clinical evidence of lung cancer recurrence at this time. #2 multiple lung nodules. Slight increase in left lower lobe lung nodule on CT scan in August 2016. Other lung nodules were stable at the time. #3 history of right sided renal cell carcinoma status post right nephrectomy in April 2001. Has not had any evidence of recurrent renal cell carcinoma. #4 end-stage renal disease on hemodialysis at the [facility]. Plan - given his history of lung cancer and multiple lung nodules we'll repeat a CT of the chest without contrast in the next couple of weeks. - Even if he did have recurrent lung cancer given his baseline shortness of breath at this time I doubt he would tolerate additional surgical resection . However if picked up early might be candidate for SBRT if there is a recurrence . -No clinical evidence of lung cancer recurrence at this time . -No other focal symptoms suggesting metastatic disease currently . -Given his history of dialysis and regular labs with his dialysis Center we will ask them for his last labs to have it on record and would try to avoid venipunctures for vein preservation.  Return to care with Dr  Whitney Muse in 12 months with labs and consideration of CT chest. Earlier if planned CT chest in 1-2 weeks shows any concerning progression.  All of the patients questions were answered with apparent satisfaction. The patient knows to call the clinic with any problems, questions or concerns.  I spent 45 minutes counseling the patient face to face. The total time spent in the appointment was 50 minutes and more than 50% was on counseling and direct patient cares.    Sullivan Lone MD [place] AAHIVMS [facility] Hematology/Oncology Physician [facility]  (Office):       [phone] (Work cell):  [phone] (Fax):           [phone]  02/04/2016 1:24 PM

## 2016-02-04 NOTE — Patient Instructions (Signed)
[place] at [facility]  Discharge Instructions:  CT Scan  Follow up with Dr. Whitney Muse in one year _______________________________________________________________  Thank you for choosing [place] at [facility] to provide your oncology and hematology care.  To afford each patient quality time with our providers, please arrive at least 15 minutes before your scheduled appointment.  You need to re-schedule your appointment if you arrive 10 or more minutes late.  We strive to give you quality time with our providers, and arriving late affects you and other patients whose appointments are after yours.  Also, if you no show three or more times for appointments you may be dismissed from the clinic.  Again, thank you for choosing [place] at [place] hope is that these requests will allow you access to exceptional care and in a timely manner. _______________________________________________________________  If you have questions after your visit, please contact our office at [phone] between the hours of 8:30 a.m. and 5:00 p.m. Voicemails left after 4:30 p.m. will not be returned until the following business day. _______________________________________________________________  For prescription refill requests, have your pharmacy contact our office. _______________________________________________________________  Recommendations made by the consultant and any test results will be sent to your referring physician. _______________________________________________________________

## 2016-02-06 DIAGNOSIS — D509 Iron deficiency anemia, unspecified: Secondary | ICD-10-CM | POA: Diagnosis not present

## 2016-02-06 DIAGNOSIS — Z992 Dependence on renal dialysis: Secondary | ICD-10-CM | POA: Diagnosis not present

## 2016-02-06 DIAGNOSIS — N2581 Secondary hyperparathyroidism of renal origin: Secondary | ICD-10-CM | POA: Diagnosis not present

## 2016-02-06 DIAGNOSIS — N186 End stage renal disease: Secondary | ICD-10-CM | POA: Diagnosis not present

## 2016-02-06 DIAGNOSIS — D631 Anemia in chronic kidney disease: Secondary | ICD-10-CM | POA: Diagnosis not present

## 2016-02-07 DIAGNOSIS — N186 End stage renal disease: Secondary | ICD-10-CM | POA: Diagnosis not present

## 2016-02-07 DIAGNOSIS — Z992 Dependence on renal dialysis: Secondary | ICD-10-CM | POA: Diagnosis not present

## 2016-02-09 DIAGNOSIS — D509 Iron deficiency anemia, unspecified: Secondary | ICD-10-CM | POA: Diagnosis not present

## 2016-02-09 DIAGNOSIS — N186 End stage renal disease: Secondary | ICD-10-CM | POA: Diagnosis not present

## 2016-02-09 DIAGNOSIS — N2581 Secondary hyperparathyroidism of renal origin: Secondary | ICD-10-CM | POA: Diagnosis not present

## 2016-02-09 DIAGNOSIS — Z992 Dependence on renal dialysis: Secondary | ICD-10-CM | POA: Diagnosis not present

## 2016-02-11 DIAGNOSIS — N2581 Secondary hyperparathyroidism of renal origin: Secondary | ICD-10-CM | POA: Diagnosis not present

## 2016-02-11 DIAGNOSIS — N186 End stage renal disease: Secondary | ICD-10-CM | POA: Diagnosis not present

## 2016-02-11 DIAGNOSIS — D509 Iron deficiency anemia, unspecified: Secondary | ICD-10-CM | POA: Diagnosis not present

## 2016-02-11 DIAGNOSIS — Z992 Dependence on renal dialysis: Secondary | ICD-10-CM | POA: Diagnosis not present

## 2016-02-13 DIAGNOSIS — D509 Iron deficiency anemia, unspecified: Secondary | ICD-10-CM | POA: Diagnosis not present

## 2016-02-13 DIAGNOSIS — Z992 Dependence on renal dialysis: Secondary | ICD-10-CM | POA: Diagnosis not present

## 2016-02-13 DIAGNOSIS — N186 End stage renal disease: Secondary | ICD-10-CM | POA: Diagnosis not present

## 2016-02-13 DIAGNOSIS — N2581 Secondary hyperparathyroidism of renal origin: Secondary | ICD-10-CM | POA: Diagnosis not present

## 2016-02-16 DIAGNOSIS — N186 End stage renal disease: Secondary | ICD-10-CM | POA: Diagnosis not present

## 2016-02-16 DIAGNOSIS — D509 Iron deficiency anemia, unspecified: Secondary | ICD-10-CM | POA: Diagnosis not present

## 2016-02-16 DIAGNOSIS — N2581 Secondary hyperparathyroidism of renal origin: Secondary | ICD-10-CM | POA: Diagnosis not present

## 2016-02-16 DIAGNOSIS — Z992 Dependence on renal dialysis: Secondary | ICD-10-CM | POA: Diagnosis not present

## 2016-02-18 DIAGNOSIS — Z992 Dependence on renal dialysis: Secondary | ICD-10-CM | POA: Diagnosis not present

## 2016-02-18 DIAGNOSIS — N2581 Secondary hyperparathyroidism of renal origin: Secondary | ICD-10-CM | POA: Diagnosis not present

## 2016-02-18 DIAGNOSIS — D509 Iron deficiency anemia, unspecified: Secondary | ICD-10-CM | POA: Diagnosis not present

## 2016-02-18 DIAGNOSIS — N186 End stage renal disease: Secondary | ICD-10-CM | POA: Diagnosis not present

## 2016-02-19 ENCOUNTER — Ambulatory Visit (HOSPITAL_COMMUNITY)
Admission: RE | Admit: 2016-02-19 | Discharge: 2016-02-19 | Disposition: A | Discharge status: Home / Self Care | Payer: Medicare Other | Source: Ambulatory Visit | Attending: Hematology | Admitting: Hematology

## 2016-02-19 DIAGNOSIS — C3490 Malignant neoplasm of unspecified part of unspecified bronchus or lung: Secondary | ICD-10-CM | POA: Diagnosis not present

## 2016-02-19 DIAGNOSIS — R918 Other nonspecific abnormal finding of lung field: Secondary | ICD-10-CM | POA: Diagnosis not present

## 2016-02-19 DIAGNOSIS — Z9889 Other specified postprocedural states: Secondary | ICD-10-CM | POA: Diagnosis not present

## 2016-02-19 DIAGNOSIS — R911 Solitary pulmonary nodule: Secondary | ICD-10-CM | POA: Diagnosis not present

## 2016-02-20 DIAGNOSIS — Z992 Dependence on renal dialysis: Secondary | ICD-10-CM | POA: Diagnosis not present

## 2016-02-20 DIAGNOSIS — D509 Iron deficiency anemia, unspecified: Secondary | ICD-10-CM | POA: Diagnosis not present

## 2016-02-20 DIAGNOSIS — N186 End stage renal disease: Secondary | ICD-10-CM | POA: Diagnosis not present

## 2016-02-20 DIAGNOSIS — N2581 Secondary hyperparathyroidism of renal origin: Secondary | ICD-10-CM | POA: Diagnosis not present

## 2016-02-23 DIAGNOSIS — Z992 Dependence on renal dialysis: Secondary | ICD-10-CM | POA: Diagnosis not present

## 2016-02-23 DIAGNOSIS — N2581 Secondary hyperparathyroidism of renal origin: Secondary | ICD-10-CM | POA: Diagnosis not present

## 2016-02-23 DIAGNOSIS — N186 End stage renal disease: Secondary | ICD-10-CM | POA: Diagnosis not present

## 2016-02-23 DIAGNOSIS — D509 Iron deficiency anemia, unspecified: Secondary | ICD-10-CM | POA: Diagnosis not present

## 2016-02-25 ENCOUNTER — Telehealth (HOSPITAL_COMMUNITY): Payer: Self-pay | Admitting: Emergency Medicine

## 2016-02-25 DIAGNOSIS — Z992 Dependence on renal dialysis: Secondary | ICD-10-CM | POA: Diagnosis not present

## 2016-02-25 DIAGNOSIS — N2581 Secondary hyperparathyroidism of renal origin: Secondary | ICD-10-CM | POA: Diagnosis not present

## 2016-02-25 DIAGNOSIS — N186 End stage renal disease: Secondary | ICD-10-CM | POA: Diagnosis not present

## 2016-02-25 DIAGNOSIS — D509 Iron deficiency anemia, unspecified: Secondary | ICD-10-CM | POA: Diagnosis not present

## 2016-02-25 NOTE — Telephone Encounter (Signed)
Called pt to let him know that we were going to bring him in in about 6-8 weeks to discuss his CT scans,  There was a little growth in the nodules in his lungs that we have been following.  Appt made for 04/27/2016 at 9:50 am.  Pt verbalized understanding.

## 2016-02-27 DIAGNOSIS — N186 End stage renal disease: Secondary | ICD-10-CM | POA: Diagnosis not present

## 2016-02-27 DIAGNOSIS — Z992 Dependence on renal dialysis: Secondary | ICD-10-CM | POA: Diagnosis not present

## 2016-02-27 DIAGNOSIS — D509 Iron deficiency anemia, unspecified: Secondary | ICD-10-CM | POA: Diagnosis not present

## 2016-02-27 DIAGNOSIS — N2581 Secondary hyperparathyroidism of renal origin: Secondary | ICD-10-CM | POA: Diagnosis not present

## 2016-03-01 DIAGNOSIS — Z992 Dependence on renal dialysis: Secondary | ICD-10-CM | POA: Diagnosis not present

## 2016-03-01 DIAGNOSIS — N2581 Secondary hyperparathyroidism of renal origin: Secondary | ICD-10-CM | POA: Diagnosis not present

## 2016-03-01 DIAGNOSIS — N186 End stage renal disease: Secondary | ICD-10-CM | POA: Diagnosis not present

## 2016-03-01 DIAGNOSIS — D509 Iron deficiency anemia, unspecified: Secondary | ICD-10-CM | POA: Diagnosis not present

## 2016-03-03 DIAGNOSIS — D509 Iron deficiency anemia, unspecified: Secondary | ICD-10-CM | POA: Diagnosis not present

## 2016-03-03 DIAGNOSIS — Z992 Dependence on renal dialysis: Secondary | ICD-10-CM | POA: Diagnosis not present

## 2016-03-03 DIAGNOSIS — N2581 Secondary hyperparathyroidism of renal origin: Secondary | ICD-10-CM | POA: Diagnosis not present

## 2016-03-03 DIAGNOSIS — N186 End stage renal disease: Secondary | ICD-10-CM | POA: Diagnosis not present

## 2016-03-05 DIAGNOSIS — N186 End stage renal disease: Secondary | ICD-10-CM | POA: Diagnosis not present

## 2016-03-05 DIAGNOSIS — D509 Iron deficiency anemia, unspecified: Secondary | ICD-10-CM | POA: Diagnosis not present

## 2016-03-05 DIAGNOSIS — N2581 Secondary hyperparathyroidism of renal origin: Secondary | ICD-10-CM | POA: Diagnosis not present

## 2016-03-05 DIAGNOSIS — Z992 Dependence on renal dialysis: Secondary | ICD-10-CM | POA: Diagnosis not present

## 2016-03-08 DIAGNOSIS — N186 End stage renal disease: Secondary | ICD-10-CM | POA: Diagnosis not present

## 2016-03-08 DIAGNOSIS — D509 Iron deficiency anemia, unspecified: Secondary | ICD-10-CM | POA: Diagnosis not present

## 2016-03-08 DIAGNOSIS — N2581 Secondary hyperparathyroidism of renal origin: Secondary | ICD-10-CM | POA: Diagnosis not present

## 2016-03-08 DIAGNOSIS — Z992 Dependence on renal dialysis: Secondary | ICD-10-CM | POA: Diagnosis not present

## 2016-03-09 DIAGNOSIS — Z992 Dependence on renal dialysis: Secondary | ICD-10-CM | POA: Diagnosis not present

## 2016-03-09 DIAGNOSIS — N186 End stage renal disease: Secondary | ICD-10-CM | POA: Diagnosis not present

## 2016-03-10 DIAGNOSIS — Z992 Dependence on renal dialysis: Secondary | ICD-10-CM | POA: Diagnosis not present

## 2016-03-10 DIAGNOSIS — N186 End stage renal disease: Secondary | ICD-10-CM | POA: Diagnosis not present

## 2016-03-10 DIAGNOSIS — N2581 Secondary hyperparathyroidism of renal origin: Secondary | ICD-10-CM | POA: Diagnosis not present

## 2016-03-10 DIAGNOSIS — D509 Iron deficiency anemia, unspecified: Secondary | ICD-10-CM | POA: Diagnosis not present

## 2016-03-12 DIAGNOSIS — Z992 Dependence on renal dialysis: Secondary | ICD-10-CM | POA: Diagnosis not present

## 2016-03-12 DIAGNOSIS — N2581 Secondary hyperparathyroidism of renal origin: Secondary | ICD-10-CM | POA: Diagnosis not present

## 2016-03-12 DIAGNOSIS — N186 End stage renal disease: Secondary | ICD-10-CM | POA: Diagnosis not present

## 2016-03-12 DIAGNOSIS — D509 Iron deficiency anemia, unspecified: Secondary | ICD-10-CM | POA: Diagnosis not present

## 2016-03-15 DIAGNOSIS — N186 End stage renal disease: Secondary | ICD-10-CM | POA: Diagnosis not present

## 2016-03-15 DIAGNOSIS — D509 Iron deficiency anemia, unspecified: Secondary | ICD-10-CM | POA: Diagnosis not present

## 2016-03-15 DIAGNOSIS — N2581 Secondary hyperparathyroidism of renal origin: Secondary | ICD-10-CM | POA: Diagnosis not present

## 2016-03-15 DIAGNOSIS — Z992 Dependence on renal dialysis: Secondary | ICD-10-CM | POA: Diagnosis not present

## 2016-03-17 DIAGNOSIS — D509 Iron deficiency anemia, unspecified: Secondary | ICD-10-CM | POA: Diagnosis not present

## 2016-03-17 DIAGNOSIS — N186 End stage renal disease: Secondary | ICD-10-CM | POA: Diagnosis not present

## 2016-03-17 DIAGNOSIS — N2581 Secondary hyperparathyroidism of renal origin: Secondary | ICD-10-CM | POA: Diagnosis not present

## 2016-03-17 DIAGNOSIS — Z992 Dependence on renal dialysis: Secondary | ICD-10-CM | POA: Diagnosis not present

## 2016-03-19 DIAGNOSIS — N2581 Secondary hyperparathyroidism of renal origin: Secondary | ICD-10-CM | POA: Diagnosis not present

## 2016-03-19 DIAGNOSIS — Z992 Dependence on renal dialysis: Secondary | ICD-10-CM | POA: Diagnosis not present

## 2016-03-19 DIAGNOSIS — N186 End stage renal disease: Secondary | ICD-10-CM | POA: Diagnosis not present

## 2016-03-19 DIAGNOSIS — D509 Iron deficiency anemia, unspecified: Secondary | ICD-10-CM | POA: Diagnosis not present

## 2016-03-22 DIAGNOSIS — N2581 Secondary hyperparathyroidism of renal origin: Secondary | ICD-10-CM | POA: Diagnosis not present

## 2016-03-22 DIAGNOSIS — N186 End stage renal disease: Secondary | ICD-10-CM | POA: Diagnosis not present

## 2016-03-22 DIAGNOSIS — Z992 Dependence on renal dialysis: Secondary | ICD-10-CM | POA: Diagnosis not present

## 2016-03-22 DIAGNOSIS — D509 Iron deficiency anemia, unspecified: Secondary | ICD-10-CM | POA: Diagnosis not present

## 2016-03-24 DIAGNOSIS — N2581 Secondary hyperparathyroidism of renal origin: Secondary | ICD-10-CM | POA: Diagnosis not present

## 2016-03-24 DIAGNOSIS — D509 Iron deficiency anemia, unspecified: Secondary | ICD-10-CM | POA: Diagnosis not present

## 2016-03-24 DIAGNOSIS — Z992 Dependence on renal dialysis: Secondary | ICD-10-CM | POA: Diagnosis not present

## 2016-03-24 DIAGNOSIS — N186 End stage renal disease: Secondary | ICD-10-CM | POA: Diagnosis not present

## 2016-03-26 DIAGNOSIS — Z992 Dependence on renal dialysis: Secondary | ICD-10-CM | POA: Diagnosis not present

## 2016-03-26 DIAGNOSIS — N186 End stage renal disease: Secondary | ICD-10-CM | POA: Diagnosis not present

## 2016-03-26 DIAGNOSIS — D509 Iron deficiency anemia, unspecified: Secondary | ICD-10-CM | POA: Diagnosis not present

## 2016-03-26 DIAGNOSIS — N2581 Secondary hyperparathyroidism of renal origin: Secondary | ICD-10-CM | POA: Diagnosis not present

## 2016-03-29 DIAGNOSIS — N186 End stage renal disease: Secondary | ICD-10-CM | POA: Diagnosis not present

## 2016-03-29 DIAGNOSIS — N2581 Secondary hyperparathyroidism of renal origin: Secondary | ICD-10-CM | POA: Diagnosis not present

## 2016-03-29 DIAGNOSIS — Z992 Dependence on renal dialysis: Secondary | ICD-10-CM | POA: Diagnosis not present

## 2016-03-29 DIAGNOSIS — D509 Iron deficiency anemia, unspecified: Secondary | ICD-10-CM | POA: Diagnosis not present

## 2016-03-31 DIAGNOSIS — N2581 Secondary hyperparathyroidism of renal origin: Secondary | ICD-10-CM | POA: Diagnosis not present

## 2016-03-31 DIAGNOSIS — D509 Iron deficiency anemia, unspecified: Secondary | ICD-10-CM | POA: Diagnosis not present

## 2016-03-31 DIAGNOSIS — Z992 Dependence on renal dialysis: Secondary | ICD-10-CM | POA: Diagnosis not present

## 2016-03-31 DIAGNOSIS — N186 End stage renal disease: Secondary | ICD-10-CM | POA: Diagnosis not present

## 2016-04-02 DIAGNOSIS — Z992 Dependence on renal dialysis: Secondary | ICD-10-CM | POA: Diagnosis not present

## 2016-04-02 DIAGNOSIS — N186 End stage renal disease: Secondary | ICD-10-CM | POA: Diagnosis not present

## 2016-04-02 DIAGNOSIS — N2581 Secondary hyperparathyroidism of renal origin: Secondary | ICD-10-CM | POA: Diagnosis not present

## 2016-04-02 DIAGNOSIS — D509 Iron deficiency anemia, unspecified: Secondary | ICD-10-CM | POA: Diagnosis not present

## 2016-04-05 DIAGNOSIS — Z992 Dependence on renal dialysis: Secondary | ICD-10-CM | POA: Diagnosis not present

## 2016-04-05 DIAGNOSIS — N2581 Secondary hyperparathyroidism of renal origin: Secondary | ICD-10-CM | POA: Diagnosis not present

## 2016-04-05 DIAGNOSIS — D509 Iron deficiency anemia, unspecified: Secondary | ICD-10-CM | POA: Diagnosis not present

## 2016-04-05 DIAGNOSIS — N186 End stage renal disease: Secondary | ICD-10-CM | POA: Diagnosis not present

## 2016-04-07 DIAGNOSIS — N186 End stage renal disease: Secondary | ICD-10-CM | POA: Diagnosis not present

## 2016-04-07 DIAGNOSIS — N2581 Secondary hyperparathyroidism of renal origin: Secondary | ICD-10-CM | POA: Diagnosis not present

## 2016-04-07 DIAGNOSIS — D509 Iron deficiency anemia, unspecified: Secondary | ICD-10-CM | POA: Diagnosis not present

## 2016-04-07 DIAGNOSIS — Z992 Dependence on renal dialysis: Secondary | ICD-10-CM | POA: Diagnosis not present

## 2016-04-08 DIAGNOSIS — N186 End stage renal disease: Secondary | ICD-10-CM | POA: Diagnosis not present

## 2016-04-08 DIAGNOSIS — Z992 Dependence on renal dialysis: Secondary | ICD-10-CM | POA: Diagnosis not present

## 2016-04-09 DIAGNOSIS — D631 Anemia in chronic kidney disease: Secondary | ICD-10-CM | POA: Diagnosis not present

## 2016-04-09 DIAGNOSIS — D509 Iron deficiency anemia, unspecified: Secondary | ICD-10-CM | POA: Diagnosis not present

## 2016-04-09 DIAGNOSIS — N186 End stage renal disease: Secondary | ICD-10-CM | POA: Diagnosis not present

## 2016-04-09 DIAGNOSIS — Z992 Dependence on renal dialysis: Secondary | ICD-10-CM | POA: Diagnosis not present

## 2016-04-09 DIAGNOSIS — N2581 Secondary hyperparathyroidism of renal origin: Secondary | ICD-10-CM | POA: Diagnosis not present

## 2016-04-12 DIAGNOSIS — Z992 Dependence on renal dialysis: Secondary | ICD-10-CM | POA: Diagnosis not present

## 2016-04-12 DIAGNOSIS — N2581 Secondary hyperparathyroidism of renal origin: Secondary | ICD-10-CM | POA: Diagnosis not present

## 2016-04-12 DIAGNOSIS — D509 Iron deficiency anemia, unspecified: Secondary | ICD-10-CM | POA: Diagnosis not present

## 2016-04-12 DIAGNOSIS — N186 End stage renal disease: Secondary | ICD-10-CM | POA: Diagnosis not present

## 2016-04-12 DIAGNOSIS — D631 Anemia in chronic kidney disease: Secondary | ICD-10-CM | POA: Diagnosis not present

## 2016-04-14 DIAGNOSIS — N186 End stage renal disease: Secondary | ICD-10-CM | POA: Diagnosis not present

## 2016-04-14 DIAGNOSIS — Z992 Dependence on renal dialysis: Secondary | ICD-10-CM | POA: Diagnosis not present

## 2016-04-14 DIAGNOSIS — D509 Iron deficiency anemia, unspecified: Secondary | ICD-10-CM | POA: Diagnosis not present

## 2016-04-14 DIAGNOSIS — N2581 Secondary hyperparathyroidism of renal origin: Secondary | ICD-10-CM | POA: Diagnosis not present

## 2016-04-14 DIAGNOSIS — D631 Anemia in chronic kidney disease: Secondary | ICD-10-CM | POA: Diagnosis not present

## 2016-04-16 DIAGNOSIS — Z992 Dependence on renal dialysis: Secondary | ICD-10-CM | POA: Diagnosis not present

## 2016-04-16 DIAGNOSIS — N2581 Secondary hyperparathyroidism of renal origin: Secondary | ICD-10-CM | POA: Diagnosis not present

## 2016-04-16 DIAGNOSIS — N186 End stage renal disease: Secondary | ICD-10-CM | POA: Diagnosis not present

## 2016-04-16 DIAGNOSIS — D509 Iron deficiency anemia, unspecified: Secondary | ICD-10-CM | POA: Diagnosis not present

## 2016-04-16 DIAGNOSIS — D631 Anemia in chronic kidney disease: Secondary | ICD-10-CM | POA: Diagnosis not present

## 2016-04-19 DIAGNOSIS — Z992 Dependence on renal dialysis: Secondary | ICD-10-CM | POA: Diagnosis not present

## 2016-04-19 DIAGNOSIS — D631 Anemia in chronic kidney disease: Secondary | ICD-10-CM | POA: Diagnosis not present

## 2016-04-19 DIAGNOSIS — D509 Iron deficiency anemia, unspecified: Secondary | ICD-10-CM | POA: Diagnosis not present

## 2016-04-19 DIAGNOSIS — N186 End stage renal disease: Secondary | ICD-10-CM | POA: Diagnosis not present

## 2016-04-19 DIAGNOSIS — N2581 Secondary hyperparathyroidism of renal origin: Secondary | ICD-10-CM | POA: Diagnosis not present

## 2016-04-21 DIAGNOSIS — Z992 Dependence on renal dialysis: Secondary | ICD-10-CM | POA: Diagnosis not present

## 2016-04-21 DIAGNOSIS — D509 Iron deficiency anemia, unspecified: Secondary | ICD-10-CM | POA: Diagnosis not present

## 2016-04-21 DIAGNOSIS — N186 End stage renal disease: Secondary | ICD-10-CM | POA: Diagnosis not present

## 2016-04-21 DIAGNOSIS — D631 Anemia in chronic kidney disease: Secondary | ICD-10-CM | POA: Diagnosis not present

## 2016-04-21 DIAGNOSIS — N2581 Secondary hyperparathyroidism of renal origin: Secondary | ICD-10-CM | POA: Diagnosis not present

## 2016-04-23 DIAGNOSIS — Z992 Dependence on renal dialysis: Secondary | ICD-10-CM | POA: Diagnosis not present

## 2016-04-23 DIAGNOSIS — N186 End stage renal disease: Secondary | ICD-10-CM | POA: Diagnosis not present

## 2016-04-23 DIAGNOSIS — N2581 Secondary hyperparathyroidism of renal origin: Secondary | ICD-10-CM | POA: Diagnosis not present

## 2016-04-23 DIAGNOSIS — D631 Anemia in chronic kidney disease: Secondary | ICD-10-CM | POA: Diagnosis not present

## 2016-04-23 DIAGNOSIS — D509 Iron deficiency anemia, unspecified: Secondary | ICD-10-CM | POA: Diagnosis not present

## 2016-04-26 DIAGNOSIS — Z992 Dependence on renal dialysis: Secondary | ICD-10-CM | POA: Diagnosis not present

## 2016-04-26 DIAGNOSIS — N2581 Secondary hyperparathyroidism of renal origin: Secondary | ICD-10-CM | POA: Diagnosis not present

## 2016-04-26 DIAGNOSIS — N186 End stage renal disease: Secondary | ICD-10-CM | POA: Diagnosis not present

## 2016-04-26 DIAGNOSIS — D631 Anemia in chronic kidney disease: Secondary | ICD-10-CM | POA: Diagnosis not present

## 2016-04-26 DIAGNOSIS — D509 Iron deficiency anemia, unspecified: Secondary | ICD-10-CM | POA: Diagnosis not present

## 2016-04-27 ENCOUNTER — Encounter (HOSPITAL_COMMUNITY): Payer: Self-pay | Admitting: Hematology & Oncology

## 2016-04-27 ENCOUNTER — Encounter (HOSPITAL_COMMUNITY): Payer: Medicare Other | Attending: Hematology & Oncology | Admitting: Hematology & Oncology

## 2016-04-27 VITALS — BP 148/66 | HR 80 | Temp 98.0°F | Resp 18 | Wt 172.7 lb

## 2016-04-27 DIAGNOSIS — Z85118 Personal history of other malignant neoplasm of bronchus and lung: Secondary | ICD-10-CM | POA: Diagnosis not present

## 2016-04-27 DIAGNOSIS — Z85528 Personal history of other malignant neoplasm of kidney: Secondary | ICD-10-CM

## 2016-04-27 DIAGNOSIS — N186 End stage renal disease: Secondary | ICD-10-CM

## 2016-04-27 DIAGNOSIS — C3492 Malignant neoplasm of unspecified part of left bronchus or lung: Secondary | ICD-10-CM

## 2016-04-27 DIAGNOSIS — R918 Other nonspecific abnormal finding of lung field: Secondary | ICD-10-CM

## 2016-04-27 NOTE — Patient Instructions (Signed)
[place] at [facility] Discharge Instructions  RECOMMENDATIONS MADE BY THE CONSULTANT AND ANY TEST RESULTS WILL BE SENT TO YOUR REFERRING PHYSICIAN.  You saw Dr.Penland today. CT scans will be scheduled for April. Follow up after scans. See Amy at checkout for appointments.  Thank you for choosing [place] at [facility] to provide your oncology and hematology care.  To afford each patient quality time with our provider, please arrive at least 15 minutes before your scheduled appointment time.   Beginning January 23rd 2017 lab work for the [facility] will be done in the  Main lab at [facility] on 1st floor. If you have a lab appointment with the [facility] please come in thru the  Main Entrance and check in at the main information desk  You need to re-schedule your appointment should you arrive 10 or more minutes late.  We strive to give you quality time with our providers, and arriving late affects you and other patients whose appointments are after yours.  Also, if you no show three or more times for appointments you may be dismissed from the clinic at the providers discretion.     Again, thank you for choosing [facility].  Our hope is that these requests will decrease the amount of time that you wait before being seen by our physicians.       _____________________________________________________________  Should you have questions after your visit to [facility], please contact our office at [phone] between the hours of 8:30 a.m. and 4:30 p.m.  Voicemails left after 4:30 p.m. will not be returned until the following business day.  For prescription refill requests, have your pharmacy contact our office.         Resources For Cancer Patients and their Caregivers ? American Cancer Society: Can assist with transportation, wigs, general needs, runs Look Good Feel Better.         989-520-9657 ? Cancer Care: Provides financial assistance, online support groups, medication/co-pay assistance.  1-800-813-HOPE 3366151129) ? [place] Assists [place] cancer patients and their families through emotional , educational and financial support.  878-195-8110 ? [place] DSS Where to apply for food stamps, Medicaid and utility assistance. 201-743-3234 ? [facility]: Transportation to medical appointments. 684-736-7822 ? Social Security Administration: May apply for disability if have a Stage IV cancer. 564-556-5511 878-388-4568 ? [employer], Disability and Transit Services: Assists with nutrition, care and transit needs. [place] Support Programs: '@10RELATIVEDAYS'$ @ > Cancer Support Group  2nd Tuesday of the month 1pm-2pm, Journey Room  > Creative Journey  3rd Tuesday of the month 1130am-1pm, Journey Room  > Look Good Feel Better  1st Wednesday of the month 10am-12 noon, Journey Room (Call [place] to register [phone])

## 2016-04-27 NOTE — Progress Notes (Signed)
[name] HEMATOLOGY/ONCOLOGY PROGRESS NOTE  Date of Service: 04/27/2016  Patient Care Team: Sharilyn Sites, MD as PCP - General (Family Medicine) Fran Lowes, MD as Attending Physician (Nephrology) Daryll Brod, MD as Consulting Physician (Orthopedic Surgery) Conrad Fort Stewart, MD as Consulting Physician (Vascular Surgery) Daneil Dolin, MD as Consulting Physician (Gastroenterology)  CHIEF COMPLAINTS/PURPOSE OF CONSULTATION:  H/o Lung cancer H/o Kidney cancer  HISTORY OF PRESENTING ILLNESS:   JUDAS MOHAMMAD is a wonderful 82 y.o. male is here for a followup for his h/o lung cancer. He also has a history of renal cell carcinoma.  Patient has a history of chronic inflammatory  Demyelinating neuropathy which was diagnosed in 1992 and which needed treatment with prednisone for more than 17 years.  He reports that he presented with abdominal discomfort and nausea in 2001 and had an ultrasound of the abdomen to rule out a gallbladder problem and was incidentally noted to have a right kidney renal cell carcinoma for which he had a right nephrectomy on 08/31/1999. 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 Arlyce Dice. Patient has been getting monitored postoperatively and thus far has not had a recurrence of his lung cancer. He was being followed by Dr. Everardo All and was last seen in clinic on 12/30/2015. He had a CT of the chest on 12/31/2015 which showed a 1 mm increase in the size of now a 3 mm left lower lobe lung parenchymal nodule. He has several other bilateral lung nodules which have  otherwise been stable.  Patient prefers to be called "Burnadette Peter." He is accompanied by his niece. He states he is still on dialysis three times a week.  Denies bowel issues. He has had a colonoscopy.  PCP is Dr. Hilma Favors but patient does not see him on a regular basis.   He cannot take the flu shot because he has [place]. Last CT chest was on 02/19/2016. Results are noted below.   MEDICAL HISTORY:   Past Medical History:  Diagnosis Date  . Arthritis    ankle , hip, knees , low back   . BPH (benign prostatic hypertrophy)   . Cancer ([place])    lung  . Cancer ([place]) 2001   renal  . Chronic inflammatory demyelinating polyneuropathy ([place]) 04/07/11   ,diagnosed 1992, tx. /w prednisone x 17 yrs. has been d/c for 3 yrs.   . Chronic kidney disease       . COPD (chronic obstructive pulmonary disease) ([place])    told that he has "a bit of emphysema"  . Dialysis patient ([place])   . GERD (gastroesophageal reflux disease)   . Hypertension    stress test done - many years ago  . Kidney calculus    SURGICAL HISTORY:  Past Surgical History:  Procedure Laterality Date  . ANKLE FUSION     R ankle  . AV FISTULA PLACEMENT  06/01/2011   Procedure: ARTERIOVENOUS (AV) FISTULA CREATION;  Surgeon: Angelia Mould, MD;  Location: [facility];  Service: Vascular;  Laterality: Left;  Creation of Left Arteriovenous fistula  . AV FISTULA PLACEMENT  Left 07/12/2012   Procedure: ARTERIOVENOUS (AV) FISTULA CREATION;  Surgeon: Conrad McKinney, MD;  Location: [place];  Service: Vascular;  Laterality: Left;  . AV FISTULA PLACEMENT Left 4.11.14  . BASCILIC VEIN TRANSPOSITION Left 08/28/2012   Procedure: BASCILIC VEIN TRANSPOSITION;  Surgeon: Conrad Irvona, MD;  Location: [place];  Service: Vascular;  Laterality: Left;  left 2nd stage basilic vein transposition  . CHOLECYSTECTOMY    . COLONOSCOPY  2005   Dr. Laural Golden, diverticulosis, hemorrhoids, 2 small polyps  . FISTULOGRAM N/A 08/16/2011   Procedure: FISTULOGRAM;  Surgeon: Angelia Mould, MD;  Location: [facility] CATH LAB;  Service: Cardiovascular;  Laterality: N/A;  . fistulogram left radial cephalic AV fistula  [phone]     Dr. Scot Dock  . HIP ARTHROPLASTY Right    "partial"  . JOINT REPLACEMENT     L knee- 2010- [facility], L hip partial replacement   . LUNG REMOVAL, PARTIAL  2003   partial, right  . NEPHRECTOMY  2001   left  . SHUNTOGRAM N/A 05/15/2012   Procedure: Earney Mallet;  Surgeon: Conrad Addington, MD;  Location: [facility] CATH LAB;  Service: Cardiovascular;  Laterality: N/A;  . SHUNTOGRAM N/A 05/09/2014   Procedure: FISTULOGRAM;  Surgeon: Conrad Snyder, MD;  Location: [facility] CATH LAB;  Service: Cardiovascular;  Laterality: N/A;    SOCIAL HISTORY: Social History   Social History  . Marital status: Married    Spouse name: N/A  . Number of children: N/A  . Years of education: N/A   Occupational History  . Not on file.   Social History Main Topics  . Smoking status: Former Smoker    Years: 35.00    Types: Cigarettes    Quit date: 02/04/1977  . Smokeless tobacco: Current User    Types: Chew     Comment: Quit in 1978  . Alcohol use No  . Drug use: No  . Sexual activity: Not on file   Other Topics Concern  . Not on file   Social History Narrative  . No narrative on file    FAMILY HISTORY: Family History  Problem Relation Age of Onset  . Heart disease Mother   . Heart disease Father   . Heart attack Father   . Anesthesia problems Neg Hx   . Hypotension Neg Hx   . Malignant hyperthermia Neg Hx   . Pseudochol deficiency Neg Hx   . Colon cancer Neg Hx     ALLERGIES:  is allergic to nsaids.  MEDICATIONS:  Current Outpatient Prescriptions  Medication Sig Dispense Refill  . Cholecalciferol (VITAMIN D) 1000 UNITS capsule Take 5,000 Units by mouth every morning.     [name] lanthanum (FOSRENOL) 1000 MG chewable tablet Chew 1,000 mg by mouth 3 (three) times daily with meals. Takes 1/2 tablet with snacks    . metoprolol succinate (TOPROL-XL) 25 MG 24 hr tablet Take  25 mg by mouth every morning.      No current facility-administered medications for this visit.     REVIEW OF SYSTEMS:    14 point review of systems was performed and is negative except as detailed under history of present illness and above   PHYSICAL EXAMINATION: ECOG PERFORMANCE STATUS: 2 - Symptomatic, <50% confined to bed  . Vitals:   04/27/16 1114  BP: (!) 148/66  Pulse: 80  Resp: 18  Temp: 98 F (36.7 C)   Filed Weights   04/27/16 1114  Weight: 172 lb 11.2 oz (78.3 kg)   .Body  mass index is 27.05 kg/m.  GENERAL:alert, in no acute distress and comfortable SKIN: skin color, texture, turgor are normal, no rashes or significant lesions EYES: normal, conjunctiva are pink and non-injected, sclera clear OROPHARYNX:no exudate, no erythema and lips, buccal mucosa, and tongue normal  NECK: supple, no JVD, thyroid normal size, non-tender, without nodularity LYMPH:  no palpable lymphadenopathy in the cervical, axillary or inguinal LUNGS: Decreased air entry bilaterally, no rales scattered rhonchi HEART: regular rate & rhythm,  trace pedal edema bilaterally  ABDOMEN: abdomen soft, non-tender, normoactive bowel sounds and no palpable hepatosplenomegaly. Musculoskeletal: no cyanosis of digits and no clubbing, palpable fistula LUE PSYCH: alert & oriented x 3 with fluent speech NEURO: no focal motor/sensory deficits  LABORATORY DATA:  I have reviewed the data as listed  . CBC Latest Ref Rng & Units 05/09/2014 08/29/2012 08/28/2012  WBC 4.0 - 10.5 K/uL - 9.3 -  Hemoglobin 13.0 - 17.0 g/dL 11.2(L) 9.3(L) 9.5(L)  Hematocrit 39.0 - 52.0 % 33.0(L) 27.6(L) 28.0(L)  Platelets 150 - 400 K/uL - 160 -    . CMP Latest Ref Rng & Units 05/09/2014 10/26/2013 08/29/2012  Glucose 70 - 99 mg/dL 104(H) - 119(H)  BUN 6 - 23 mg/dL 36(H) - 60(H)  Creatinine 0.50 - 1.35 mg/dL 5.60(H) - 5.29(H)  Sodium 135 - 145 mmol/L 139 - 136  Potassium 3.5 - 5.1 mmol/L 3.9 - 4.6  Chloride 96 - 112 mEq/L 100  - 104  CO2 19 - 32 mEq/L - - 20  Calcium 8.4 - 10.5 mg/dL - - 8.3(L)  Total Protein 6.0 - 8.3 g/dL - 6.0 -  Total Bilirubin 0.3 - 1.2 mg/dL - - -  Alkaline Phos 39 - 117 U/L - - -  AST 0 - 37 U/L - - -  ALT 0 - 53 U/L - - -     RADIOGRAPHIC STUDIES: I have personally reviewed the radiological images as listed and agreed with the findings in the report.  Study Result   CLINICAL DATA:  History of lung carcinoma and partial right  EXAM: CT CHEST WITHOUT CONTRAST  TECHNIQUE: Multidetector CT imaging of the chest was performed following the standard protocol without IV contrast.  COMPARISON:  12/31/2014  FINDINGS: Cardiovascular: Somewhat limited due to the lack of IV contrast. Aortic calcifications are seen without aneurysmal dilatation. Mild coronary calcifications are seen.  Mediastinum/Nodes: The thoracic inlet is within normal limits. Some small scattered mediastinal lymph nodes are seen. Largest of these lies in the AP window and measures 8 mm in short axis. This is relatively stable from the prior exam. No definitive hilar adenopathy is noted.  Lungs/Pleura: The lungs are again well aerated. There are changes consistent with prior left upper lobectomy consistent with the patient's given clinical history.  On the right common there are multiple parenchymal nodules identified. A 6 mm nodule is noted in the lateral aspect of the right upper lobe best seen on image number 42 of series 3. This has increased in the interval from the prior exam at which time it measured 4 mm. Multiple other nodules are noted throughout the right lung. The largest of these lies in the right lower lobe and measures 9 mm which is an increase of 3 mm from the prior exam.  On the left, multiple small parenchymal nodules are noted as well as some calcified granulomas. The largest of these nodules lies in the left lower lobe and measures 6 mm. This is an increase of 3 mm from the  prior exam.  No sizable effusion or pneumothorax is noted.  Upper Abdomen: There are changes consistent with prior right nephrectomy and cholecystectomy. Diverticular change of the colon is noted without diverticulitis. Hepatic granuloma is noted.  Musculoskeletal: Degenerative change of the thoracic spine is noted. Postoperative changes in the left chest wall are noted.  IMPRESSION: Increase in bilateral parenchymal nodules particularly on the right with increased number and size. The largest of these again lies in the lower lobe on the right measuring mm and increased in size from 6 mm on the prior exam. Doubling of the largest nodule in the left lower lobe is noted from 3-6 mm. This increase in size must be viewed as a progression in disease and given the multiplicity of nodules likely represent metastatic disease. None of these are amenable to percutaneous biopsy at this time.  Postoperative changes consistent with the known history.  These results will be called to the ordering clinician or representative by the Radiologist Assistant, and communication documented in the PACS or zVision Dashboard.   Electronically Signed   By: Inez Catalina M.D.   On: 02/19/2016 09:31     ASSESSMENT & PLAN:   82 year old Caucasian gentleman with   #1 history of left upper lobe bronchoalveolar carcinoma stage I  - T1 and N0 M0. Diagnosed in April 2003 status post left upper lobectomy by Dr. Arlyce Dice.  Patient has no overt clinical evidence of lung cancer recurrence at this time. #2 multiple lung nodules. Slight increase in left lower lobe lung nodules on CT scan. Images are reviewed with the patient and his family. We discussed options on how to proceed. They are still too small to biopsy, realistically may be below PET detection. Have opted to repeat imaging in April and reassess.. #3 history of right sided renal cell carcinoma status post right nephrectomy in April 2001. Has not had  any evidence of recurrent renal cell carcinoma. #4 end-stage renal disease on hemodialysis at the [facility]. Plan -CT imaging from October 2017 demonstrated enlargement in a few of these pulmonary nodules which is concerning for bronchogenic carcinoma.  None of the pulmonary nodules are amenable to biopsy at this time.  Will repeat CT chest imaging in April 2018. - Even if he did have recurrent lung cancer given his baseline shortness of breath at this time I doubt he would tolerate additional surgical resection . However if picked up early might be candidate for SBRT if there is a recurrence . Could be a candidate for immunotherapy.  -No other focal symptoms suggesting metastatic disease currently .   Follow up with patient in April to discuss results of scans. He feels very comfortable with the plan as detailed.  All of the patients questions were answered with apparent satisfaction. The patient knows to call the clinic with any problems, questions or concerns.  Orders Placed This Encounter  Procedures  . CT Chest Wo Contrast    Standing Status:   Future    Standing Expiration Date:   04/27/2017    Order Specific Question:   Reason for Exam (SYMPTOM  OR DIAGNOSIS REQUIRED)    Answer:   follow-up bilateral pulmonary nodules    Order Specific Question:   Preferred imaging location?    Answer:   [facility]     This document serves as a record of services personally performed by Ancil Linsey, MD. It was created on her behalf by Elmyra Ricks, a trained medical scribe. The creation of this record  is based on the scribe's personal observations and the provider's statements to them. This document has been checked and approved by the attending provider.  I have reviewed the above documentation for accuracy and completeness and I agree with the above.  This note is electronically signed.  Patrici Ranks, MD  04/27/2016 2:13 PM

## 2016-04-28 DIAGNOSIS — N2581 Secondary hyperparathyroidism of renal origin: Secondary | ICD-10-CM | POA: Diagnosis not present

## 2016-04-28 DIAGNOSIS — D631 Anemia in chronic kidney disease: Secondary | ICD-10-CM | POA: Diagnosis not present

## 2016-04-28 DIAGNOSIS — N186 End stage renal disease: Secondary | ICD-10-CM | POA: Diagnosis not present

## 2016-04-28 DIAGNOSIS — Z992 Dependence on renal dialysis: Secondary | ICD-10-CM | POA: Diagnosis not present

## 2016-04-28 DIAGNOSIS — D509 Iron deficiency anemia, unspecified: Secondary | ICD-10-CM | POA: Diagnosis not present

## 2016-04-30 DIAGNOSIS — D509 Iron deficiency anemia, unspecified: Secondary | ICD-10-CM | POA: Diagnosis not present

## 2016-04-30 DIAGNOSIS — Z992 Dependence on renal dialysis: Secondary | ICD-10-CM | POA: Diagnosis not present

## 2016-04-30 DIAGNOSIS — D631 Anemia in chronic kidney disease: Secondary | ICD-10-CM | POA: Diagnosis not present

## 2016-04-30 DIAGNOSIS — N186 End stage renal disease: Secondary | ICD-10-CM | POA: Diagnosis not present

## 2016-04-30 DIAGNOSIS — N2581 Secondary hyperparathyroidism of renal origin: Secondary | ICD-10-CM | POA: Diagnosis not present

## 2016-05-02 DIAGNOSIS — D509 Iron deficiency anemia, unspecified: Secondary | ICD-10-CM | POA: Diagnosis not present

## 2016-05-02 DIAGNOSIS — Z992 Dependence on renal dialysis: Secondary | ICD-10-CM | POA: Diagnosis not present

## 2016-05-02 DIAGNOSIS — N186 End stage renal disease: Secondary | ICD-10-CM | POA: Diagnosis not present

## 2016-05-02 DIAGNOSIS — N2581 Secondary hyperparathyroidism of renal origin: Secondary | ICD-10-CM | POA: Diagnosis not present

## 2016-05-02 DIAGNOSIS — D631 Anemia in chronic kidney disease: Secondary | ICD-10-CM | POA: Diagnosis not present

## 2016-05-04 ENCOUNTER — Encounter (HOSPITAL_COMMUNITY): Payer: Self-pay | Admitting: Hematology & Oncology

## 2016-05-05 DIAGNOSIS — Z992 Dependence on renal dialysis: Secondary | ICD-10-CM | POA: Diagnosis not present

## 2016-05-05 DIAGNOSIS — N2581 Secondary hyperparathyroidism of renal origin: Secondary | ICD-10-CM | POA: Diagnosis not present

## 2016-05-05 DIAGNOSIS — D631 Anemia in chronic kidney disease: Secondary | ICD-10-CM | POA: Diagnosis not present

## 2016-05-05 DIAGNOSIS — N186 End stage renal disease: Secondary | ICD-10-CM | POA: Diagnosis not present

## 2016-05-05 DIAGNOSIS — D509 Iron deficiency anemia, unspecified: Secondary | ICD-10-CM | POA: Diagnosis not present

## 2016-05-07 DIAGNOSIS — Z992 Dependence on renal dialysis: Secondary | ICD-10-CM | POA: Diagnosis not present

## 2016-05-07 DIAGNOSIS — D509 Iron deficiency anemia, unspecified: Secondary | ICD-10-CM | POA: Diagnosis not present

## 2016-05-07 DIAGNOSIS — N186 End stage renal disease: Secondary | ICD-10-CM | POA: Diagnosis not present

## 2016-05-07 DIAGNOSIS — D631 Anemia in chronic kidney disease: Secondary | ICD-10-CM | POA: Diagnosis not present

## 2016-05-07 DIAGNOSIS — N2581 Secondary hyperparathyroidism of renal origin: Secondary | ICD-10-CM | POA: Diagnosis not present

## 2016-05-09 DIAGNOSIS — N186 End stage renal disease: Secondary | ICD-10-CM | POA: Diagnosis not present

## 2016-05-09 DIAGNOSIS — Z992 Dependence on renal dialysis: Secondary | ICD-10-CM | POA: Diagnosis not present

## 2016-05-10 DIAGNOSIS — D509 Iron deficiency anemia, unspecified: Secondary | ICD-10-CM | POA: Diagnosis not present

## 2016-05-10 DIAGNOSIS — Z992 Dependence on renal dialysis: Secondary | ICD-10-CM | POA: Diagnosis not present

## 2016-05-10 DIAGNOSIS — N2581 Secondary hyperparathyroidism of renal origin: Secondary | ICD-10-CM | POA: Diagnosis not present

## 2016-05-10 DIAGNOSIS — D631 Anemia in chronic kidney disease: Secondary | ICD-10-CM | POA: Diagnosis not present

## 2016-05-10 DIAGNOSIS — N186 End stage renal disease: Secondary | ICD-10-CM | POA: Diagnosis not present

## 2016-05-12 DIAGNOSIS — D509 Iron deficiency anemia, unspecified: Secondary | ICD-10-CM | POA: Diagnosis not present

## 2016-05-12 DIAGNOSIS — Z992 Dependence on renal dialysis: Secondary | ICD-10-CM | POA: Diagnosis not present

## 2016-05-12 DIAGNOSIS — N2581 Secondary hyperparathyroidism of renal origin: Secondary | ICD-10-CM | POA: Diagnosis not present

## 2016-05-12 DIAGNOSIS — N186 End stage renal disease: Secondary | ICD-10-CM | POA: Diagnosis not present

## 2016-05-12 DIAGNOSIS — D631 Anemia in chronic kidney disease: Secondary | ICD-10-CM | POA: Diagnosis not present

## 2016-05-14 DIAGNOSIS — N186 End stage renal disease: Secondary | ICD-10-CM | POA: Diagnosis not present

## 2016-05-14 DIAGNOSIS — D631 Anemia in chronic kidney disease: Secondary | ICD-10-CM | POA: Diagnosis not present

## 2016-05-14 DIAGNOSIS — Z992 Dependence on renal dialysis: Secondary | ICD-10-CM | POA: Diagnosis not present

## 2016-05-14 DIAGNOSIS — D509 Iron deficiency anemia, unspecified: Secondary | ICD-10-CM | POA: Diagnosis not present

## 2016-05-14 DIAGNOSIS — N2581 Secondary hyperparathyroidism of renal origin: Secondary | ICD-10-CM | POA: Diagnosis not present

## 2016-05-17 DIAGNOSIS — Z992 Dependence on renal dialysis: Secondary | ICD-10-CM | POA: Diagnosis not present

## 2016-05-17 DIAGNOSIS — N2581 Secondary hyperparathyroidism of renal origin: Secondary | ICD-10-CM | POA: Diagnosis not present

## 2016-05-17 DIAGNOSIS — D509 Iron deficiency anemia, unspecified: Secondary | ICD-10-CM | POA: Diagnosis not present

## 2016-05-17 DIAGNOSIS — N186 End stage renal disease: Secondary | ICD-10-CM | POA: Diagnosis not present

## 2016-05-17 DIAGNOSIS — D631 Anemia in chronic kidney disease: Secondary | ICD-10-CM | POA: Diagnosis not present

## 2016-05-19 DIAGNOSIS — D509 Iron deficiency anemia, unspecified: Secondary | ICD-10-CM | POA: Diagnosis not present

## 2016-05-19 DIAGNOSIS — D631 Anemia in chronic kidney disease: Secondary | ICD-10-CM | POA: Diagnosis not present

## 2016-05-19 DIAGNOSIS — Z992 Dependence on renal dialysis: Secondary | ICD-10-CM | POA: Diagnosis not present

## 2016-05-19 DIAGNOSIS — N2581 Secondary hyperparathyroidism of renal origin: Secondary | ICD-10-CM | POA: Diagnosis not present

## 2016-05-19 DIAGNOSIS — N186 End stage renal disease: Secondary | ICD-10-CM | POA: Diagnosis not present

## 2016-05-21 DIAGNOSIS — Z992 Dependence on renal dialysis: Secondary | ICD-10-CM | POA: Diagnosis not present

## 2016-05-21 DIAGNOSIS — D631 Anemia in chronic kidney disease: Secondary | ICD-10-CM | POA: Diagnosis not present

## 2016-05-21 DIAGNOSIS — N186 End stage renal disease: Secondary | ICD-10-CM | POA: Diagnosis not present

## 2016-05-21 DIAGNOSIS — N2581 Secondary hyperparathyroidism of renal origin: Secondary | ICD-10-CM | POA: Diagnosis not present

## 2016-05-21 DIAGNOSIS — D509 Iron deficiency anemia, unspecified: Secondary | ICD-10-CM | POA: Diagnosis not present

## 2016-05-24 DIAGNOSIS — Z992 Dependence on renal dialysis: Secondary | ICD-10-CM | POA: Diagnosis not present

## 2016-05-24 DIAGNOSIS — D509 Iron deficiency anemia, unspecified: Secondary | ICD-10-CM | POA: Diagnosis not present

## 2016-05-24 DIAGNOSIS — N186 End stage renal disease: Secondary | ICD-10-CM | POA: Diagnosis not present

## 2016-05-24 DIAGNOSIS — D631 Anemia in chronic kidney disease: Secondary | ICD-10-CM | POA: Diagnosis not present

## 2016-05-24 DIAGNOSIS — N2581 Secondary hyperparathyroidism of renal origin: Secondary | ICD-10-CM | POA: Diagnosis not present

## 2016-05-26 DIAGNOSIS — N2581 Secondary hyperparathyroidism of renal origin: Secondary | ICD-10-CM | POA: Diagnosis not present

## 2016-05-26 DIAGNOSIS — D631 Anemia in chronic kidney disease: Secondary | ICD-10-CM | POA: Diagnosis not present

## 2016-05-26 DIAGNOSIS — Z992 Dependence on renal dialysis: Secondary | ICD-10-CM | POA: Diagnosis not present

## 2016-05-26 DIAGNOSIS — N186 End stage renal disease: Secondary | ICD-10-CM | POA: Diagnosis not present

## 2016-05-26 DIAGNOSIS — D509 Iron deficiency anemia, unspecified: Secondary | ICD-10-CM | POA: Diagnosis not present

## 2016-05-28 DIAGNOSIS — D631 Anemia in chronic kidney disease: Secondary | ICD-10-CM | POA: Diagnosis not present

## 2016-05-28 DIAGNOSIS — Z992 Dependence on renal dialysis: Secondary | ICD-10-CM | POA: Diagnosis not present

## 2016-05-28 DIAGNOSIS — D509 Iron deficiency anemia, unspecified: Secondary | ICD-10-CM | POA: Diagnosis not present

## 2016-05-28 DIAGNOSIS — N186 End stage renal disease: Secondary | ICD-10-CM | POA: Diagnosis not present

## 2016-05-28 DIAGNOSIS — N2581 Secondary hyperparathyroidism of renal origin: Secondary | ICD-10-CM | POA: Diagnosis not present

## 2016-05-31 DIAGNOSIS — N186 End stage renal disease: Secondary | ICD-10-CM | POA: Diagnosis not present

## 2016-05-31 DIAGNOSIS — N2581 Secondary hyperparathyroidism of renal origin: Secondary | ICD-10-CM | POA: Diagnosis not present

## 2016-05-31 DIAGNOSIS — Z992 Dependence on renal dialysis: Secondary | ICD-10-CM | POA: Diagnosis not present

## 2016-05-31 DIAGNOSIS — D631 Anemia in chronic kidney disease: Secondary | ICD-10-CM | POA: Diagnosis not present

## 2016-05-31 DIAGNOSIS — D509 Iron deficiency anemia, unspecified: Secondary | ICD-10-CM | POA: Diagnosis not present

## 2016-06-02 DIAGNOSIS — Z992 Dependence on renal dialysis: Secondary | ICD-10-CM | POA: Diagnosis not present

## 2016-06-02 DIAGNOSIS — N186 End stage renal disease: Secondary | ICD-10-CM | POA: Diagnosis not present

## 2016-06-02 DIAGNOSIS — D509 Iron deficiency anemia, unspecified: Secondary | ICD-10-CM | POA: Diagnosis not present

## 2016-06-02 DIAGNOSIS — N2581 Secondary hyperparathyroidism of renal origin: Secondary | ICD-10-CM | POA: Diagnosis not present

## 2016-06-02 DIAGNOSIS — D631 Anemia in chronic kidney disease: Secondary | ICD-10-CM | POA: Diagnosis not present

## 2016-06-04 DIAGNOSIS — Z992 Dependence on renal dialysis: Secondary | ICD-10-CM | POA: Diagnosis not present

## 2016-06-04 DIAGNOSIS — D509 Iron deficiency anemia, unspecified: Secondary | ICD-10-CM | POA: Diagnosis not present

## 2016-06-04 DIAGNOSIS — N186 End stage renal disease: Secondary | ICD-10-CM | POA: Diagnosis not present

## 2016-06-04 DIAGNOSIS — N2581 Secondary hyperparathyroidism of renal origin: Secondary | ICD-10-CM | POA: Diagnosis not present

## 2016-06-04 DIAGNOSIS — D631 Anemia in chronic kidney disease: Secondary | ICD-10-CM | POA: Diagnosis not present

## 2016-06-07 DIAGNOSIS — Z992 Dependence on renal dialysis: Secondary | ICD-10-CM | POA: Diagnosis not present

## 2016-06-07 DIAGNOSIS — N186 End stage renal disease: Secondary | ICD-10-CM | POA: Diagnosis not present

## 2016-06-07 DIAGNOSIS — N2581 Secondary hyperparathyroidism of renal origin: Secondary | ICD-10-CM | POA: Diagnosis not present

## 2016-06-07 DIAGNOSIS — D631 Anemia in chronic kidney disease: Secondary | ICD-10-CM | POA: Diagnosis not present

## 2016-06-07 DIAGNOSIS — D509 Iron deficiency anemia, unspecified: Secondary | ICD-10-CM | POA: Diagnosis not present

## 2016-06-09 DIAGNOSIS — D631 Anemia in chronic kidney disease: Secondary | ICD-10-CM | POA: Diagnosis not present

## 2016-06-09 DIAGNOSIS — D509 Iron deficiency anemia, unspecified: Secondary | ICD-10-CM | POA: Diagnosis not present

## 2016-06-09 DIAGNOSIS — Z992 Dependence on renal dialysis: Secondary | ICD-10-CM | POA: Diagnosis not present

## 2016-06-09 DIAGNOSIS — N186 End stage renal disease: Secondary | ICD-10-CM | POA: Diagnosis not present

## 2016-06-09 DIAGNOSIS — N2581 Secondary hyperparathyroidism of renal origin: Secondary | ICD-10-CM | POA: Diagnosis not present

## 2016-06-11 DIAGNOSIS — D509 Iron deficiency anemia, unspecified: Secondary | ICD-10-CM | POA: Diagnosis not present

## 2016-06-11 DIAGNOSIS — D631 Anemia in chronic kidney disease: Secondary | ICD-10-CM | POA: Diagnosis not present

## 2016-06-11 DIAGNOSIS — N2581 Secondary hyperparathyroidism of renal origin: Secondary | ICD-10-CM | POA: Diagnosis not present

## 2016-06-11 DIAGNOSIS — N186 End stage renal disease: Secondary | ICD-10-CM | POA: Diagnosis not present

## 2016-06-11 DIAGNOSIS — Z992 Dependence on renal dialysis: Secondary | ICD-10-CM | POA: Diagnosis not present

## 2016-06-14 DIAGNOSIS — D509 Iron deficiency anemia, unspecified: Secondary | ICD-10-CM | POA: Diagnosis not present

## 2016-06-14 DIAGNOSIS — N186 End stage renal disease: Secondary | ICD-10-CM | POA: Diagnosis not present

## 2016-06-14 DIAGNOSIS — Z992 Dependence on renal dialysis: Secondary | ICD-10-CM | POA: Diagnosis not present

## 2016-06-14 DIAGNOSIS — N2581 Secondary hyperparathyroidism of renal origin: Secondary | ICD-10-CM | POA: Diagnosis not present

## 2016-06-14 DIAGNOSIS — D631 Anemia in chronic kidney disease: Secondary | ICD-10-CM | POA: Diagnosis not present

## 2016-06-16 DIAGNOSIS — D509 Iron deficiency anemia, unspecified: Secondary | ICD-10-CM | POA: Diagnosis not present

## 2016-06-16 DIAGNOSIS — Z992 Dependence on renal dialysis: Secondary | ICD-10-CM | POA: Diagnosis not present

## 2016-06-16 DIAGNOSIS — D631 Anemia in chronic kidney disease: Secondary | ICD-10-CM | POA: Diagnosis not present

## 2016-06-16 DIAGNOSIS — N186 End stage renal disease: Secondary | ICD-10-CM | POA: Diagnosis not present

## 2016-06-16 DIAGNOSIS — N2581 Secondary hyperparathyroidism of renal origin: Secondary | ICD-10-CM | POA: Diagnosis not present

## 2016-06-18 DIAGNOSIS — N2581 Secondary hyperparathyroidism of renal origin: Secondary | ICD-10-CM | POA: Diagnosis not present

## 2016-06-18 DIAGNOSIS — D631 Anemia in chronic kidney disease: Secondary | ICD-10-CM | POA: Diagnosis not present

## 2016-06-18 DIAGNOSIS — D509 Iron deficiency anemia, unspecified: Secondary | ICD-10-CM | POA: Diagnosis not present

## 2016-06-18 DIAGNOSIS — N186 End stage renal disease: Secondary | ICD-10-CM | POA: Diagnosis not present

## 2016-06-18 DIAGNOSIS — Z992 Dependence on renal dialysis: Secondary | ICD-10-CM | POA: Diagnosis not present

## 2016-06-21 DIAGNOSIS — Z992 Dependence on renal dialysis: Secondary | ICD-10-CM | POA: Diagnosis not present

## 2016-06-21 DIAGNOSIS — D631 Anemia in chronic kidney disease: Secondary | ICD-10-CM | POA: Diagnosis not present

## 2016-06-21 DIAGNOSIS — D509 Iron deficiency anemia, unspecified: Secondary | ICD-10-CM | POA: Diagnosis not present

## 2016-06-21 DIAGNOSIS — N186 End stage renal disease: Secondary | ICD-10-CM | POA: Diagnosis not present

## 2016-06-21 DIAGNOSIS — N2581 Secondary hyperparathyroidism of renal origin: Secondary | ICD-10-CM | POA: Diagnosis not present

## 2016-06-23 DIAGNOSIS — D631 Anemia in chronic kidney disease: Secondary | ICD-10-CM | POA: Diagnosis not present

## 2016-06-23 DIAGNOSIS — N2581 Secondary hyperparathyroidism of renal origin: Secondary | ICD-10-CM | POA: Diagnosis not present

## 2016-06-23 DIAGNOSIS — Z992 Dependence on renal dialysis: Secondary | ICD-10-CM | POA: Diagnosis not present

## 2016-06-23 DIAGNOSIS — D509 Iron deficiency anemia, unspecified: Secondary | ICD-10-CM | POA: Diagnosis not present

## 2016-06-23 DIAGNOSIS — N186 End stage renal disease: Secondary | ICD-10-CM | POA: Diagnosis not present

## 2016-06-25 DIAGNOSIS — D509 Iron deficiency anemia, unspecified: Secondary | ICD-10-CM | POA: Diagnosis not present

## 2016-06-25 DIAGNOSIS — D631 Anemia in chronic kidney disease: Secondary | ICD-10-CM | POA: Diagnosis not present

## 2016-06-25 DIAGNOSIS — N2581 Secondary hyperparathyroidism of renal origin: Secondary | ICD-10-CM | POA: Diagnosis not present

## 2016-06-25 DIAGNOSIS — N186 End stage renal disease: Secondary | ICD-10-CM | POA: Diagnosis not present

## 2016-06-25 DIAGNOSIS — Z992 Dependence on renal dialysis: Secondary | ICD-10-CM | POA: Diagnosis not present

## 2016-06-28 DIAGNOSIS — D509 Iron deficiency anemia, unspecified: Secondary | ICD-10-CM | POA: Diagnosis not present

## 2016-06-28 DIAGNOSIS — N2581 Secondary hyperparathyroidism of renal origin: Secondary | ICD-10-CM | POA: Diagnosis not present

## 2016-06-28 DIAGNOSIS — Z992 Dependence on renal dialysis: Secondary | ICD-10-CM | POA: Diagnosis not present

## 2016-06-28 DIAGNOSIS — D631 Anemia in chronic kidney disease: Secondary | ICD-10-CM | POA: Diagnosis not present

## 2016-06-28 DIAGNOSIS — N186 End stage renal disease: Secondary | ICD-10-CM | POA: Diagnosis not present

## 2016-06-30 DIAGNOSIS — D631 Anemia in chronic kidney disease: Secondary | ICD-10-CM | POA: Diagnosis not present

## 2016-06-30 DIAGNOSIS — D509 Iron deficiency anemia, unspecified: Secondary | ICD-10-CM | POA: Diagnosis not present

## 2016-06-30 DIAGNOSIS — N2581 Secondary hyperparathyroidism of renal origin: Secondary | ICD-10-CM | POA: Diagnosis not present

## 2016-06-30 DIAGNOSIS — Z992 Dependence on renal dialysis: Secondary | ICD-10-CM | POA: Diagnosis not present

## 2016-06-30 DIAGNOSIS — N186 End stage renal disease: Secondary | ICD-10-CM | POA: Diagnosis not present

## 2016-07-02 DIAGNOSIS — D509 Iron deficiency anemia, unspecified: Secondary | ICD-10-CM | POA: Diagnosis not present

## 2016-07-02 DIAGNOSIS — N186 End stage renal disease: Secondary | ICD-10-CM | POA: Diagnosis not present

## 2016-07-02 DIAGNOSIS — Z992 Dependence on renal dialysis: Secondary | ICD-10-CM | POA: Diagnosis not present

## 2016-07-02 DIAGNOSIS — D631 Anemia in chronic kidney disease: Secondary | ICD-10-CM | POA: Diagnosis not present

## 2016-07-02 DIAGNOSIS — N2581 Secondary hyperparathyroidism of renal origin: Secondary | ICD-10-CM | POA: Diagnosis not present

## 2016-07-05 DIAGNOSIS — N186 End stage renal disease: Secondary | ICD-10-CM | POA: Diagnosis not present

## 2016-07-05 DIAGNOSIS — N2581 Secondary hyperparathyroidism of renal origin: Secondary | ICD-10-CM | POA: Diagnosis not present

## 2016-07-05 DIAGNOSIS — D509 Iron deficiency anemia, unspecified: Secondary | ICD-10-CM | POA: Diagnosis not present

## 2016-07-05 DIAGNOSIS — D631 Anemia in chronic kidney disease: Secondary | ICD-10-CM | POA: Diagnosis not present

## 2016-07-05 DIAGNOSIS — Z992 Dependence on renal dialysis: Secondary | ICD-10-CM | POA: Diagnosis not present

## 2016-07-06 DIAGNOSIS — R5383 Other fatigue: Secondary | ICD-10-CM | POA: Diagnosis not present

## 2016-07-06 DIAGNOSIS — R972 Elevated prostate specific antigen [PSA]: Secondary | ICD-10-CM | POA: Diagnosis not present

## 2016-07-06 DIAGNOSIS — J449 Chronic obstructive pulmonary disease, unspecified: Secondary | ICD-10-CM | POA: Diagnosis not present

## 2016-07-06 DIAGNOSIS — Z0000 Encounter for general adult medical examination without abnormal findings: Secondary | ICD-10-CM | POA: Diagnosis not present

## 2016-07-06 DIAGNOSIS — Z1389 Encounter for screening for other disorder: Secondary | ICD-10-CM | POA: Diagnosis not present

## 2016-07-06 DIAGNOSIS — E782 Mixed hyperlipidemia: Secondary | ICD-10-CM | POA: Diagnosis not present

## 2016-07-06 DIAGNOSIS — C3490 Malignant neoplasm of unspecified part of unspecified bronchus or lung: Secondary | ICD-10-CM | POA: Diagnosis not present

## 2016-07-06 DIAGNOSIS — N185 Chronic kidney disease, stage 5: Secondary | ICD-10-CM | POA: Diagnosis not present

## 2016-07-06 DIAGNOSIS — Z6826 Body mass index (BMI) 26.0-26.9, adult: Secondary | ICD-10-CM | POA: Diagnosis not present

## 2016-07-07 DIAGNOSIS — D509 Iron deficiency anemia, unspecified: Secondary | ICD-10-CM | POA: Diagnosis not present

## 2016-07-07 DIAGNOSIS — Z992 Dependence on renal dialysis: Secondary | ICD-10-CM | POA: Diagnosis not present

## 2016-07-07 DIAGNOSIS — D631 Anemia in chronic kidney disease: Secondary | ICD-10-CM | POA: Diagnosis not present

## 2016-07-07 DIAGNOSIS — N186 End stage renal disease: Secondary | ICD-10-CM | POA: Diagnosis not present

## 2016-07-07 DIAGNOSIS — N2581 Secondary hyperparathyroidism of renal origin: Secondary | ICD-10-CM | POA: Diagnosis not present

## 2016-07-09 DIAGNOSIS — N2581 Secondary hyperparathyroidism of renal origin: Secondary | ICD-10-CM | POA: Diagnosis not present

## 2016-07-09 DIAGNOSIS — D509 Iron deficiency anemia, unspecified: Secondary | ICD-10-CM | POA: Diagnosis not present

## 2016-07-09 DIAGNOSIS — D631 Anemia in chronic kidney disease: Secondary | ICD-10-CM | POA: Diagnosis not present

## 2016-07-09 DIAGNOSIS — N186 End stage renal disease: Secondary | ICD-10-CM | POA: Diagnosis not present

## 2016-07-09 DIAGNOSIS — Z992 Dependence on renal dialysis: Secondary | ICD-10-CM | POA: Diagnosis not present

## 2016-07-12 DIAGNOSIS — N2581 Secondary hyperparathyroidism of renal origin: Secondary | ICD-10-CM | POA: Diagnosis not present

## 2016-07-12 DIAGNOSIS — D509 Iron deficiency anemia, unspecified: Secondary | ICD-10-CM | POA: Diagnosis not present

## 2016-07-12 DIAGNOSIS — Z992 Dependence on renal dialysis: Secondary | ICD-10-CM | POA: Diagnosis not present

## 2016-07-12 DIAGNOSIS — D631 Anemia in chronic kidney disease: Secondary | ICD-10-CM | POA: Diagnosis not present

## 2016-07-12 DIAGNOSIS — N186 End stage renal disease: Secondary | ICD-10-CM | POA: Diagnosis not present

## 2016-07-14 DIAGNOSIS — N2581 Secondary hyperparathyroidism of renal origin: Secondary | ICD-10-CM | POA: Diagnosis not present

## 2016-07-14 DIAGNOSIS — Z992 Dependence on renal dialysis: Secondary | ICD-10-CM | POA: Diagnosis not present

## 2016-07-14 DIAGNOSIS — D509 Iron deficiency anemia, unspecified: Secondary | ICD-10-CM | POA: Diagnosis not present

## 2016-07-14 DIAGNOSIS — D631 Anemia in chronic kidney disease: Secondary | ICD-10-CM | POA: Diagnosis not present

## 2016-07-14 DIAGNOSIS — N186 End stage renal disease: Secondary | ICD-10-CM | POA: Diagnosis not present

## 2016-07-16 DIAGNOSIS — N2581 Secondary hyperparathyroidism of renal origin: Secondary | ICD-10-CM | POA: Diagnosis not present

## 2016-07-16 DIAGNOSIS — D509 Iron deficiency anemia, unspecified: Secondary | ICD-10-CM | POA: Diagnosis not present

## 2016-07-16 DIAGNOSIS — D631 Anemia in chronic kidney disease: Secondary | ICD-10-CM | POA: Diagnosis not present

## 2016-07-16 DIAGNOSIS — N186 End stage renal disease: Secondary | ICD-10-CM | POA: Diagnosis not present

## 2016-07-16 DIAGNOSIS — Z992 Dependence on renal dialysis: Secondary | ICD-10-CM | POA: Diagnosis not present

## 2016-07-19 DIAGNOSIS — N186 End stage renal disease: Secondary | ICD-10-CM | POA: Diagnosis not present

## 2016-07-19 DIAGNOSIS — Z992 Dependence on renal dialysis: Secondary | ICD-10-CM | POA: Diagnosis not present

## 2016-07-19 DIAGNOSIS — N2581 Secondary hyperparathyroidism of renal origin: Secondary | ICD-10-CM | POA: Diagnosis not present

## 2016-07-19 DIAGNOSIS — D509 Iron deficiency anemia, unspecified: Secondary | ICD-10-CM | POA: Diagnosis not present

## 2016-07-19 DIAGNOSIS — D631 Anemia in chronic kidney disease: Secondary | ICD-10-CM | POA: Diagnosis not present

## 2016-07-21 DIAGNOSIS — D509 Iron deficiency anemia, unspecified: Secondary | ICD-10-CM | POA: Diagnosis not present

## 2016-07-21 DIAGNOSIS — N186 End stage renal disease: Secondary | ICD-10-CM | POA: Diagnosis not present

## 2016-07-21 DIAGNOSIS — D631 Anemia in chronic kidney disease: Secondary | ICD-10-CM | POA: Diagnosis not present

## 2016-07-21 DIAGNOSIS — N2581 Secondary hyperparathyroidism of renal origin: Secondary | ICD-10-CM | POA: Diagnosis not present

## 2016-07-21 DIAGNOSIS — Z992 Dependence on renal dialysis: Secondary | ICD-10-CM | POA: Diagnosis not present

## 2016-07-23 DIAGNOSIS — D631 Anemia in chronic kidney disease: Secondary | ICD-10-CM | POA: Diagnosis not present

## 2016-07-23 DIAGNOSIS — N2581 Secondary hyperparathyroidism of renal origin: Secondary | ICD-10-CM | POA: Diagnosis not present

## 2016-07-23 DIAGNOSIS — N186 End stage renal disease: Secondary | ICD-10-CM | POA: Diagnosis not present

## 2016-07-23 DIAGNOSIS — Z992 Dependence on renal dialysis: Secondary | ICD-10-CM | POA: Diagnosis not present

## 2016-07-23 DIAGNOSIS — D509 Iron deficiency anemia, unspecified: Secondary | ICD-10-CM | POA: Diagnosis not present

## 2016-07-26 DIAGNOSIS — N186 End stage renal disease: Secondary | ICD-10-CM | POA: Diagnosis not present

## 2016-07-26 DIAGNOSIS — D509 Iron deficiency anemia, unspecified: Secondary | ICD-10-CM | POA: Diagnosis not present

## 2016-07-26 DIAGNOSIS — D631 Anemia in chronic kidney disease: Secondary | ICD-10-CM | POA: Diagnosis not present

## 2016-07-26 DIAGNOSIS — N2581 Secondary hyperparathyroidism of renal origin: Secondary | ICD-10-CM | POA: Diagnosis not present

## 2016-07-26 DIAGNOSIS — Z992 Dependence on renal dialysis: Secondary | ICD-10-CM | POA: Diagnosis not present

## 2016-07-28 DIAGNOSIS — D509 Iron deficiency anemia, unspecified: Secondary | ICD-10-CM | POA: Diagnosis not present

## 2016-07-28 DIAGNOSIS — N2581 Secondary hyperparathyroidism of renal origin: Secondary | ICD-10-CM | POA: Diagnosis not present

## 2016-07-28 DIAGNOSIS — Z992 Dependence on renal dialysis: Secondary | ICD-10-CM | POA: Diagnosis not present

## 2016-07-28 DIAGNOSIS — D631 Anemia in chronic kidney disease: Secondary | ICD-10-CM | POA: Diagnosis not present

## 2016-07-28 DIAGNOSIS — N186 End stage renal disease: Secondary | ICD-10-CM | POA: Diagnosis not present

## 2016-07-30 DIAGNOSIS — D509 Iron deficiency anemia, unspecified: Secondary | ICD-10-CM | POA: Diagnosis not present

## 2016-07-30 DIAGNOSIS — D631 Anemia in chronic kidney disease: Secondary | ICD-10-CM | POA: Diagnosis not present

## 2016-07-30 DIAGNOSIS — N2581 Secondary hyperparathyroidism of renal origin: Secondary | ICD-10-CM | POA: Diagnosis not present

## 2016-07-30 DIAGNOSIS — Z992 Dependence on renal dialysis: Secondary | ICD-10-CM | POA: Diagnosis not present

## 2016-07-30 DIAGNOSIS — N186 End stage renal disease: Secondary | ICD-10-CM | POA: Diagnosis not present

## 2016-08-02 DIAGNOSIS — N186 End stage renal disease: Secondary | ICD-10-CM | POA: Diagnosis not present

## 2016-08-02 DIAGNOSIS — D509 Iron deficiency anemia, unspecified: Secondary | ICD-10-CM | POA: Diagnosis not present

## 2016-08-02 DIAGNOSIS — N2581 Secondary hyperparathyroidism of renal origin: Secondary | ICD-10-CM | POA: Diagnosis not present

## 2016-08-02 DIAGNOSIS — D631 Anemia in chronic kidney disease: Secondary | ICD-10-CM | POA: Diagnosis not present

## 2016-08-02 DIAGNOSIS — Z992 Dependence on renal dialysis: Secondary | ICD-10-CM | POA: Diagnosis not present

## 2016-08-04 DIAGNOSIS — D509 Iron deficiency anemia, unspecified: Secondary | ICD-10-CM | POA: Diagnosis not present

## 2016-08-04 DIAGNOSIS — D631 Anemia in chronic kidney disease: Secondary | ICD-10-CM | POA: Diagnosis not present

## 2016-08-04 DIAGNOSIS — N2581 Secondary hyperparathyroidism of renal origin: Secondary | ICD-10-CM | POA: Diagnosis not present

## 2016-08-04 DIAGNOSIS — N186 End stage renal disease: Secondary | ICD-10-CM | POA: Diagnosis not present

## 2016-08-04 DIAGNOSIS — Z992 Dependence on renal dialysis: Secondary | ICD-10-CM | POA: Diagnosis not present

## 2016-08-06 DIAGNOSIS — Z992 Dependence on renal dialysis: Secondary | ICD-10-CM | POA: Diagnosis not present

## 2016-08-06 DIAGNOSIS — D631 Anemia in chronic kidney disease: Secondary | ICD-10-CM | POA: Diagnosis not present

## 2016-08-06 DIAGNOSIS — N2581 Secondary hyperparathyroidism of renal origin: Secondary | ICD-10-CM | POA: Diagnosis not present

## 2016-08-06 DIAGNOSIS — N186 End stage renal disease: Secondary | ICD-10-CM | POA: Diagnosis not present

## 2016-08-06 DIAGNOSIS — D509 Iron deficiency anemia, unspecified: Secondary | ICD-10-CM | POA: Diagnosis not present

## 2016-08-07 DIAGNOSIS — Z992 Dependence on renal dialysis: Secondary | ICD-10-CM | POA: Diagnosis not present

## 2016-08-07 DIAGNOSIS — N186 End stage renal disease: Secondary | ICD-10-CM | POA: Diagnosis not present

## 2016-08-09 DIAGNOSIS — N186 End stage renal disease: Secondary | ICD-10-CM | POA: Diagnosis not present

## 2016-08-09 DIAGNOSIS — N2581 Secondary hyperparathyroidism of renal origin: Secondary | ICD-10-CM | POA: Diagnosis not present

## 2016-08-09 DIAGNOSIS — Z992 Dependence on renal dialysis: Secondary | ICD-10-CM | POA: Diagnosis not present

## 2016-08-09 DIAGNOSIS — D509 Iron deficiency anemia, unspecified: Secondary | ICD-10-CM | POA: Diagnosis not present

## 2016-08-09 DIAGNOSIS — D631 Anemia in chronic kidney disease: Secondary | ICD-10-CM | POA: Diagnosis not present

## 2016-08-11 DIAGNOSIS — N2581 Secondary hyperparathyroidism of renal origin: Secondary | ICD-10-CM | POA: Diagnosis not present

## 2016-08-11 DIAGNOSIS — D509 Iron deficiency anemia, unspecified: Secondary | ICD-10-CM | POA: Diagnosis not present

## 2016-08-11 DIAGNOSIS — N186 End stage renal disease: Secondary | ICD-10-CM | POA: Diagnosis not present

## 2016-08-11 DIAGNOSIS — D631 Anemia in chronic kidney disease: Secondary | ICD-10-CM | POA: Diagnosis not present

## 2016-08-11 DIAGNOSIS — Z992 Dependence on renal dialysis: Secondary | ICD-10-CM | POA: Diagnosis not present

## 2016-08-13 DIAGNOSIS — Z992 Dependence on renal dialysis: Secondary | ICD-10-CM | POA: Diagnosis not present

## 2016-08-13 DIAGNOSIS — D509 Iron deficiency anemia, unspecified: Secondary | ICD-10-CM | POA: Diagnosis not present

## 2016-08-13 DIAGNOSIS — N2581 Secondary hyperparathyroidism of renal origin: Secondary | ICD-10-CM | POA: Diagnosis not present

## 2016-08-13 DIAGNOSIS — D631 Anemia in chronic kidney disease: Secondary | ICD-10-CM | POA: Diagnosis not present

## 2016-08-13 DIAGNOSIS — N186 End stage renal disease: Secondary | ICD-10-CM | POA: Diagnosis not present

## 2016-08-16 DIAGNOSIS — Z992 Dependence on renal dialysis: Secondary | ICD-10-CM | POA: Diagnosis not present

## 2016-08-16 DIAGNOSIS — N2581 Secondary hyperparathyroidism of renal origin: Secondary | ICD-10-CM | POA: Diagnosis not present

## 2016-08-16 DIAGNOSIS — D509 Iron deficiency anemia, unspecified: Secondary | ICD-10-CM | POA: Diagnosis not present

## 2016-08-16 DIAGNOSIS — D631 Anemia in chronic kidney disease: Secondary | ICD-10-CM | POA: Diagnosis not present

## 2016-08-16 DIAGNOSIS — N186 End stage renal disease: Secondary | ICD-10-CM | POA: Diagnosis not present

## 2016-08-18 DIAGNOSIS — N186 End stage renal disease: Secondary | ICD-10-CM | POA: Diagnosis not present

## 2016-08-18 DIAGNOSIS — N2581 Secondary hyperparathyroidism of renal origin: Secondary | ICD-10-CM | POA: Diagnosis not present

## 2016-08-18 DIAGNOSIS — D631 Anemia in chronic kidney disease: Secondary | ICD-10-CM | POA: Diagnosis not present

## 2016-08-18 DIAGNOSIS — Z992 Dependence on renal dialysis: Secondary | ICD-10-CM | POA: Diagnosis not present

## 2016-08-18 DIAGNOSIS — D509 Iron deficiency anemia, unspecified: Secondary | ICD-10-CM | POA: Diagnosis not present

## 2016-08-19 ENCOUNTER — Ambulatory Visit (HOSPITAL_COMMUNITY): Payer: Medicare Other

## 2016-08-20 DIAGNOSIS — N2581 Secondary hyperparathyroidism of renal origin: Secondary | ICD-10-CM | POA: Diagnosis not present

## 2016-08-20 DIAGNOSIS — Z992 Dependence on renal dialysis: Secondary | ICD-10-CM | POA: Diagnosis not present

## 2016-08-20 DIAGNOSIS — D631 Anemia in chronic kidney disease: Secondary | ICD-10-CM | POA: Diagnosis not present

## 2016-08-20 DIAGNOSIS — D509 Iron deficiency anemia, unspecified: Secondary | ICD-10-CM | POA: Diagnosis not present

## 2016-08-20 DIAGNOSIS — N186 End stage renal disease: Secondary | ICD-10-CM | POA: Diagnosis not present

## 2016-08-23 DIAGNOSIS — N2581 Secondary hyperparathyroidism of renal origin: Secondary | ICD-10-CM | POA: Diagnosis not present

## 2016-08-23 DIAGNOSIS — D631 Anemia in chronic kidney disease: Secondary | ICD-10-CM | POA: Diagnosis not present

## 2016-08-23 DIAGNOSIS — D509 Iron deficiency anemia, unspecified: Secondary | ICD-10-CM | POA: Diagnosis not present

## 2016-08-23 DIAGNOSIS — N186 End stage renal disease: Secondary | ICD-10-CM | POA: Diagnosis not present

## 2016-08-23 DIAGNOSIS — Z992 Dependence on renal dialysis: Secondary | ICD-10-CM | POA: Diagnosis not present

## 2016-08-24 ENCOUNTER — Encounter (HOSPITAL_COMMUNITY): Payer: Medicare Other

## 2016-08-25 DIAGNOSIS — N2581 Secondary hyperparathyroidism of renal origin: Secondary | ICD-10-CM | POA: Diagnosis not present

## 2016-08-25 DIAGNOSIS — D509 Iron deficiency anemia, unspecified: Secondary | ICD-10-CM | POA: Diagnosis not present

## 2016-08-25 DIAGNOSIS — D631 Anemia in chronic kidney disease: Secondary | ICD-10-CM | POA: Diagnosis not present

## 2016-08-25 DIAGNOSIS — N186 End stage renal disease: Secondary | ICD-10-CM | POA: Diagnosis not present

## 2016-08-25 DIAGNOSIS — Z992 Dependence on renal dialysis: Secondary | ICD-10-CM | POA: Diagnosis not present

## 2016-08-27 DIAGNOSIS — N186 End stage renal disease: Secondary | ICD-10-CM | POA: Diagnosis not present

## 2016-08-27 DIAGNOSIS — D631 Anemia in chronic kidney disease: Secondary | ICD-10-CM | POA: Diagnosis not present

## 2016-08-27 DIAGNOSIS — N2581 Secondary hyperparathyroidism of renal origin: Secondary | ICD-10-CM | POA: Diagnosis not present

## 2016-08-27 DIAGNOSIS — Z992 Dependence on renal dialysis: Secondary | ICD-10-CM | POA: Diagnosis not present

## 2016-08-27 DIAGNOSIS — D509 Iron deficiency anemia, unspecified: Secondary | ICD-10-CM | POA: Diagnosis not present

## 2016-08-30 DIAGNOSIS — Z992 Dependence on renal dialysis: Secondary | ICD-10-CM | POA: Diagnosis not present

## 2016-08-30 DIAGNOSIS — D631 Anemia in chronic kidney disease: Secondary | ICD-10-CM | POA: Diagnosis not present

## 2016-08-30 DIAGNOSIS — N186 End stage renal disease: Secondary | ICD-10-CM | POA: Diagnosis not present

## 2016-08-30 DIAGNOSIS — N2581 Secondary hyperparathyroidism of renal origin: Secondary | ICD-10-CM | POA: Diagnosis not present

## 2016-08-30 DIAGNOSIS — D509 Iron deficiency anemia, unspecified: Secondary | ICD-10-CM | POA: Diagnosis not present

## 2016-09-01 DIAGNOSIS — N186 End stage renal disease: Secondary | ICD-10-CM | POA: Diagnosis not present

## 2016-09-01 DIAGNOSIS — D631 Anemia in chronic kidney disease: Secondary | ICD-10-CM | POA: Diagnosis not present

## 2016-09-01 DIAGNOSIS — Z992 Dependence on renal dialysis: Secondary | ICD-10-CM | POA: Diagnosis not present

## 2016-09-01 DIAGNOSIS — D509 Iron deficiency anemia, unspecified: Secondary | ICD-10-CM | POA: Diagnosis not present

## 2016-09-01 DIAGNOSIS — N2581 Secondary hyperparathyroidism of renal origin: Secondary | ICD-10-CM | POA: Diagnosis not present

## 2016-09-03 DIAGNOSIS — D631 Anemia in chronic kidney disease: Secondary | ICD-10-CM | POA: Diagnosis not present

## 2016-09-03 DIAGNOSIS — N186 End stage renal disease: Secondary | ICD-10-CM | POA: Diagnosis not present

## 2016-09-03 DIAGNOSIS — Z992 Dependence on renal dialysis: Secondary | ICD-10-CM | POA: Diagnosis not present

## 2016-09-03 DIAGNOSIS — N2581 Secondary hyperparathyroidism of renal origin: Secondary | ICD-10-CM | POA: Diagnosis not present

## 2016-09-03 DIAGNOSIS — D509 Iron deficiency anemia, unspecified: Secondary | ICD-10-CM | POA: Diagnosis not present

## 2016-09-06 DIAGNOSIS — N2581 Secondary hyperparathyroidism of renal origin: Secondary | ICD-10-CM | POA: Diagnosis not present

## 2016-09-06 DIAGNOSIS — D631 Anemia in chronic kidney disease: Secondary | ICD-10-CM | POA: Diagnosis not present

## 2016-09-06 DIAGNOSIS — N186 End stage renal disease: Secondary | ICD-10-CM | POA: Diagnosis not present

## 2016-09-06 DIAGNOSIS — D509 Iron deficiency anemia, unspecified: Secondary | ICD-10-CM | POA: Diagnosis not present

## 2016-09-06 DIAGNOSIS — Z992 Dependence on renal dialysis: Secondary | ICD-10-CM | POA: Diagnosis not present

## 2016-09-08 DIAGNOSIS — D631 Anemia in chronic kidney disease: Secondary | ICD-10-CM | POA: Diagnosis not present

## 2016-09-08 DIAGNOSIS — N186 End stage renal disease: Secondary | ICD-10-CM | POA: Diagnosis not present

## 2016-09-08 DIAGNOSIS — N2581 Secondary hyperparathyroidism of renal origin: Secondary | ICD-10-CM | POA: Diagnosis not present

## 2016-09-08 DIAGNOSIS — D509 Iron deficiency anemia, unspecified: Secondary | ICD-10-CM | POA: Diagnosis not present

## 2016-09-08 DIAGNOSIS — Z992 Dependence on renal dialysis: Secondary | ICD-10-CM | POA: Diagnosis not present

## 2016-09-09 ENCOUNTER — Ambulatory Visit (HOSPITAL_COMMUNITY)
Admission: RE | Admit: 2016-09-09 | Discharge: 2016-09-09 | Disposition: A | Discharge status: Home / Self Care | Payer: Medicare Other | Source: Ambulatory Visit | Attending: Hematology & Oncology | Admitting: Hematology & Oncology

## 2016-09-09 DIAGNOSIS — C3492 Malignant neoplasm of unspecified part of left bronchus or lung: Secondary | ICD-10-CM | POA: Diagnosis not present

## 2016-09-09 DIAGNOSIS — J439 Emphysema, unspecified: Secondary | ICD-10-CM | POA: Diagnosis not present

## 2016-09-09 DIAGNOSIS — R918 Other nonspecific abnormal finding of lung field: Secondary | ICD-10-CM | POA: Diagnosis present

## 2016-09-09 DIAGNOSIS — C3490 Malignant neoplasm of unspecified part of unspecified bronchus or lung: Secondary | ICD-10-CM | POA: Diagnosis not present

## 2016-09-10 ENCOUNTER — Ambulatory Visit (HOSPITAL_COMMUNITY): Payer: Medicare Other

## 2016-09-10 DIAGNOSIS — D509 Iron deficiency anemia, unspecified: Secondary | ICD-10-CM | POA: Diagnosis not present

## 2016-09-10 DIAGNOSIS — N2581 Secondary hyperparathyroidism of renal origin: Secondary | ICD-10-CM | POA: Diagnosis not present

## 2016-09-10 DIAGNOSIS — Z992 Dependence on renal dialysis: Secondary | ICD-10-CM | POA: Diagnosis not present

## 2016-09-10 DIAGNOSIS — N186 End stage renal disease: Secondary | ICD-10-CM | POA: Diagnosis not present

## 2016-09-10 DIAGNOSIS — D631 Anemia in chronic kidney disease: Secondary | ICD-10-CM | POA: Diagnosis not present

## 2016-09-13 DIAGNOSIS — N2581 Secondary hyperparathyroidism of renal origin: Secondary | ICD-10-CM | POA: Diagnosis not present

## 2016-09-13 DIAGNOSIS — N186 End stage renal disease: Secondary | ICD-10-CM | POA: Diagnosis not present

## 2016-09-13 DIAGNOSIS — Z992 Dependence on renal dialysis: Secondary | ICD-10-CM | POA: Diagnosis not present

## 2016-09-13 DIAGNOSIS — D631 Anemia in chronic kidney disease: Secondary | ICD-10-CM | POA: Diagnosis not present

## 2016-09-13 DIAGNOSIS — D509 Iron deficiency anemia, unspecified: Secondary | ICD-10-CM | POA: Diagnosis not present

## 2016-09-14 ENCOUNTER — Encounter (HOSPITAL_COMMUNITY): Payer: Self-pay

## 2016-09-14 ENCOUNTER — Encounter (HOSPITAL_COMMUNITY): Payer: Medicare Other | Attending: Oncology | Admitting: Oncology

## 2016-09-14 VITALS — BP 140/63 | HR 75 | Temp 97.5°F | Resp 18 | Ht 67.0 in | Wt 174.4 lb

## 2016-09-14 DIAGNOSIS — C3492 Malignant neoplasm of unspecified part of left bronchus or lung: Secondary | ICD-10-CM

## 2016-09-14 DIAGNOSIS — Z85118 Personal history of other malignant neoplasm of bronchus and lung: Secondary | ICD-10-CM | POA: Diagnosis not present

## 2016-09-14 DIAGNOSIS — Z85528 Personal history of other malignant neoplasm of kidney: Secondary | ICD-10-CM | POA: Diagnosis not present

## 2016-09-14 DIAGNOSIS — R918 Other nonspecific abnormal finding of lung field: Secondary | ICD-10-CM

## 2016-09-14 NOTE — Patient Instructions (Addendum)
[place] at [facility] Discharge Instructions  RECOMMENDATIONS MADE BY THE CONSULTANT AND ANY TEST RESULTS WILL BE SENT TO YOUR REFERRING PHYSICIAN.  You were seen today by Dr. Talbert Cage. Ct scan ordered. Return in 3 months for follow up and results.    Thank you for choosing [place] at [facility] to provide your oncology and hematology care.  To afford each patient quality time with our provider, please arrive at least 15 minutes before your scheduled appointment time.    If you have a lab appointment with the [facility] please come in thru the  Main Entrance and check in at the main information desk  You need to re-schedule your appointment should you arrive 10 or more minutes late.  We strive to give you quality time with our providers, and arriving late affects you and other patients whose appointments are after yours.  Also, if you no show three or more times for appointments you may be dismissed from the clinic at the providers discretion.     Again, thank you for choosing [facility].  Our hope is that these requests will decrease the amount of time that you wait before being seen by our physicians.       _____________________________________________________________  Should you have questions after your visit to [facility], please contact our office at [phone] between the hours of 8:30 a.m. and 4:30 p.m.  Voicemails left after 4:30 p.m. will not be returned until the following business day.  For prescription refill requests, have your pharmacy contact our office.       Resources For Cancer Patients and their Caregivers ? American Cancer Society: Can assist with transportation, wigs, general needs, runs Look Good Feel Better.        531-155-9905 ? Cancer Care: Provides financial assistance, online support groups, medication/co-pay assistance.  1-800-813-HOPE (579)208-8002) ? [employer] Assists [place] cancer patients and their families through emotional , educational and financial support.  306-005-3741 ? [place] DSS Where to apply for food stamps, Medicaid and utility assistance. 410-282-9081 ? [facility]: Transportation to medical appointments. 223-249-8678 ? Social Security Administration: May apply for disability if have a Stage IV cancer. 6267594326 828 098 5607 ? [employer], Disability and Transit Services: Assists with nutrition, care and transit needs. [employer] Support Programs: '@10RELATIVEDAYS'$ @ > Cancer Support Group  2nd Tuesday of the month 1pm-2pm, Journey Room  > Creative Journey  3rd Tuesday of the month 1130am-1pm, Journey Room  > Look Good Feel Better  1st Wednesday of the month 10am-12 noon, Journey Room (Call [place] to register [phone])

## 2016-09-14 NOTE — Progress Notes (Signed)
[name] HEMATOLOGY/ONCOLOGY PROGRESS NOTE  Date of Service: 09/14/2016  Patient Care Team: Sharilyn Sites, MD as PCP - General (Family Medicine) Fran Lowes, MD as Attending Physician (Nephrology) Daryll Brod, MD as Consulting Physician (Orthopedic Surgery) Conrad Maxwell, MD as Consulting Physician (Vascular Surgery) Rourk, Cristopher Estimable, MD as Consulting Physician (Gastroenterology)  CHIEF COMPLAINTS/PURPOSE OF CONSULTATION:  H/o Lung cancer H/o Kidney cancer  HISTORY OF PRESENTING ILLNESS:   [name] is a wonderful 83 y.o. male is here for a followup for his h/o lung cancer. He also has a history of renal cell carcinoma.  Patient has a history of chronic inflammatory  Demyelinating neuropathy which was diagnosed in 1992 and which needed treatment with prednisone for more than 17 years.  He reports that he presented with abdominal discomfort and nausea in 2001 and had an ultrasound of the abdomen to rule out a gallbladder problem and was incidentally noted to have a right kidney renal cell carcinoma for which he had a right nephrectomy on 08/31/1999. 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 Arlyce Dice. Patient has been getting monitored postoperatively and thus far has not had a recurrence of his lung cancer. He was being followed by Dr. Everardo All and was last seen in clinic on 12/30/2015. He had a CT of the chest on 12/31/2015 which showed a 1 mm increase in the size of now a 3 mm left lower lobe lung parenchymal nodule. He has several other bilateral lung nodules which have  otherwise been stable.  Patient presents to the clinic for continuing follow up. I personally reviewed and went over scans with the patient.  CT Chest from 09/09/2016 demonstrated multiple bilateral pulmonary nodules as before. 8 mm lobular right upper lobe nodule is clearly progressive when comparing back over the prior 2 studies. Other scattered pulmonary nodules are less obviously progressed since the most recent comparison study but appear larger than on 12/31/2014.  Patient states he is doing well. He reports he has chronic shortness of breath that has not worsened from his baseline. He also has chronic fatigue. He denies chest pain, abdominal pain, and swelling in his legs.  The patient does not have any other questions or concerns at this time.   MEDICAL HISTORY:   Past Medical History:  Diagnosis Date  . Arthritis    ankle , hip, knees , low back   . BPH (benign prostatic hypertrophy)   . Cancer ([place])    lung  . Cancer ([place]) 2001   renal  . Chronic inflammatory demyelinating polyneuropathy ([place]) 04/07/11   ,diagnosed 1992, tx. /w prednisone x 17 yrs. has been d/c for 3 yrs.   . Chronic kidney disease       . COPD (chronic obstructive pulmonary disease) ([place])    told that he has "a bit of emphysema"  . Dialysis patient ([place])   . GERD (gastroesophageal reflux disease)   . Hypertension    stress test done - many years ago  . Kidney calculus    SURGICAL HISTORY:  Past Surgical History:  Procedure Laterality  Date  . ANKLE FUSION     R ankle  . AV FISTULA PLACEMENT  06/01/2011   Procedure: ARTERIOVENOUS (AV) FISTULA CREATION;  Surgeon: Angelia Mould, MD;  Location: [facility] OR;  Service: Vascular;  Laterality: Left;  Creation of Left Arteriovenous fistula  . AV FISTULA PLACEMENT Left 07/12/2012   Procedure: ARTERIOVENOUS (AV) FISTULA CREATION;  Surgeon: Conrad Viola, MD;  Location: [facility];  Service: Vascular;  Laterality: Left;  . AV FISTULA PLACEMENT Left 4.11.14  .  BASCILIC VEIN TRANSPOSITION Left 08/28/2012   Procedure: BASCILIC VEIN TRANSPOSITION;  Surgeon: Conrad Lubeck, MD;  Location: [facility];  Service: Vascular;  Laterality: Left;  left 2nd stage basilic vein transposition  . CHOLECYSTECTOMY    . COLONOSCOPY  2005   Dr. Laural Golden, diverticulosis, hemorrhoids, 2 small polyps  . FISTULOGRAM N/A 08/16/2011   Procedure: FISTULOGRAM;  Surgeon: Angelia Mould, MD;  Location: [facility] CATH LAB;  Service: Cardiovascular;  Laterality: N/A;  . fistulogram left radial cephalic AV fistula  [id number]     Dr. Scot Dock  . HIP ARTHROPLASTY Right    "partial"  . JOINT REPLACEMENT     L knee- 2010- [facility], L hip partial replacement   . LUNG REMOVAL, PARTIAL  2003   partial, right  . NEPHRECTOMY  2001   left  . SHUNTOGRAM N/A 05/15/2012   Procedure: Earney Mallet;  Surgeon: Conrad Colbert, MD;  Location: [facility] CATH LAB;  Service: Cardiovascular;  Laterality: N/A;  . SHUNTOGRAM N/A 05/09/2014   Procedure: FISTULOGRAM;  Surgeon: Conrad Huntsville, MD;  Location[facility] CATH LAB;  Service: Cardiovascular;  Laterality: N/A;    SOCIAL HISTORY: Social History   Social History  . Marital status: Married    Spouse name: N/A  . Number of children: N/A  . Years of education: N/A   Occupational History  . Not on file.   Social History Main Topics  . Smoking status: Former Smoker    Years: 35.00    Types: Cigarettes    Quit date: 02/04/1977  . Smokeless tobacco: Current User    Types: Chew     Comment: Quit in 1978  . Alcohol use No  . Drug use: No  . Sexual activity: Not on file   Other Topics Concern  . Not on file   Social History Narrative  . No narrative on file    FAMILY HISTORY: Family History  Problem Relation Age of Onset  . Heart disease Mother   . Heart disease Father   . Heart attack Father   . Anesthesia problems Neg Hx   . Hypotension Neg Hx   . Malignant hyperthermia Neg Hx   . Pseudochol deficiency Neg Hx   . Colon cancer Neg Hx     ALLERGIES:  is  allergic to nsaids.  MEDICATIONS:  Current Outpatient Prescriptions  Medication Sig Dispense Refill  . Cholecalciferol (VITAMIN D) 1000 UNITS capsule Take 5,000 Units by mouth every morning.     [name] lanthanum (FOSRENOL) 1000 MG chewable tablet Chew 1,000 mg by mouth 3 (three) times daily with meals. Takes 1/2 tablet with snacks    . metoprolol succinate (TOPROL-XL) 25 MG 24 hr tablet Take 25 mg by mouth every morning.      No current facility-administered medications for this visit.     REVIEW OF SYSTEMS:    Review of Systems  Constitutional: Positive for malaise/fatigue.  HENT: Negative.   Eyes: Negative.   Respiratory: Positive for shortness of breath (worsened  since last visit).   Cardiovascular: Negative.  Negative for chest pain and leg swelling.  Gastrointestinal: Negative.  Negative for abdominal pain.  Genitourinary: Negative.   Musculoskeletal: Negative.   Skin: Negative.   Neurological: Negative.   Endo/Heme/Allergies: Negative.   Psychiatric/Behavioral: Negative.   All other systems reviewed and are negative.  14 point review of systems was performed and is negative except as detailed under history of present illness and above   PHYSICAL EXAMINATION: ECOG PERFORMANCE STATUS: 2 - Symptomatic, <50% confined to bed  . Vitals:   09/14/16 1504  BP: 140/63  Pulse: 75  Resp: 18  Temp: 97.5 F (36.4 C)   Filed Weights   09/14/16 1504  Weight: 174 lb 6.4 oz (79.1 kg)     Physical Exam  Constitutional: He is oriented to person, place, and time and well-developed, well-nourished, and in no distress.  HENT:  Head: Normocephalic and atraumatic.  Eyes: Conjunctivae and EOM are normal. Pupils are equal, round, and reactive to light.  Neck: Normal range of motion. Neck supple.  Cardiovascular: Normal rate, regular rhythm and normal heart sounds.   Pulmonary/Chest: Effort normal and breath sounds normal.  Abdominal: Soft. Bowel sounds are normal.  Musculoskeletal:  Normal range of motion.  Neurological: He is alert and oriented to person, place, and time. Gait normal.  Skin: Skin is warm and dry.  Nursing note and vitals reviewed.   LABORATORY DATA:  I have reviewed the data as listed  . CBC Latest Ref Rng & Units 05/09/2014 08/29/2012 08/28/2012  WBC 4.0 - 10.5 K/uL - 9.3 -  Hemoglobin 13.0 - 17.0 g/dL 11.2(L) 9.3(L) 9.5(L)  Hematocrit 39.0 - 52.0 % 33.0(L) 27.6(L) 28.0(L)  Platelets 150 - 400 K/uL - 160 -    . CMP Latest Ref Rng & Units 05/09/2014 10/26/2013 08/29/2012  Glucose 70 - 99 mg/dL 104(H) - 119(H)  BUN 6 - 23 mg/dL 36(H) - 60(H)  Creatinine 0.50 - 1.35 mg/dL 5.60(H) - 5.29(H)  Sodium 135 - 145 mmol/L 139 - 136  Potassium 3.5 - 5.1 mmol/L 3.9 - 4.6  Chloride 96 - 112 mEq/L 100 - 104  CO2 19 - 32 mEq/L - - 20  Calcium 8.4 - 10.5 mg/dL - - 8.3(L)  Total Protein 6.0 - 8.3 g/dL - 6.0 -  Total Bilirubin 0.3 - 1.2 mg/dL - - -  Alkaline Phos 39 - 117 U/L - - -  AST 0 - 37 U/L - - -  ALT 0 - 53 U/L - - -     RADIOGRAPHIC STUDIES: I have personally reviewed the radiological images as listed and agreed with the findings in the report.  Study Result   CLINICAL DATA:  History of lung carcinoma and partial right  EXAM: CT CHEST WITHOUT CONTRAST  TECHNIQUE: Multidetector CT imaging of the chest was performed following the standard protocol without IV contrast.  COMPARISON:  12/31/2014  FINDINGS: Cardiovascular: Somewhat limited due to the lack of IV contrast. Aortic calcifications are seen without aneurysmal dilatation. Mild coronary calcifications are seen.  Mediastinum/Nodes: The thoracic inlet is within normal limits. Some small scattered mediastinal lymph nodes are seen. Largest of these lies in the AP window and measures 8 mm in short axis. This is relatively stable from the prior exam. No definitive hilar adenopathy is noted.  Lungs/Pleura: The lungs are again well aerated. There are changes consistent with  prior left upper lobectomy consistent with the patient's given clinical history.  On the right common there are  multiple parenchymal nodules identified. A 6 mm nodule is noted in the lateral aspect of the right upper lobe best seen on image number 42 of series 3. This has increased in the interval from the prior exam at which time it measured 4 mm. Multiple other nodules are noted throughout the right lung. The largest of these lies in the right lower lobe and measures 9 mm which is an increase of 3 mm from the prior exam.  On the left, multiple small parenchymal nodules are noted as well as some calcified granulomas. The largest of these nodules lies in the left lower lobe and measures 6 mm. This is an increase of 3 mm from the prior exam.  No sizable effusion or pneumothorax is noted.  Upper Abdomen: There are changes consistent with prior right nephrectomy and cholecystectomy. Diverticular change of the colon is noted without diverticulitis. Hepatic granuloma is noted.  Musculoskeletal: Degenerative change of the thoracic spine is noted. Postoperative changes in the left chest wall are noted.  IMPRESSION: Increase in bilateral parenchymal nodules particularly on the right with increased number and size. The largest of these again lies in the lower lobe on the right measuring mm and increased in size from 6 mm on the prior exam. Doubling of the largest nodule in the left lower lobe is noted from 3-6 mm. This increase in size must be viewed as a progression in disease and given the multiplicity of nodules likely represent metastatic disease. None of these are amenable to percutaneous biopsy at this time.  Postoperative changes consistent with the known history.  These results will be called to the ordering clinician or representative by the Radiologist Assistant, and communication documented in the PACS or zVision Dashboard.   Electronically Signed   By: Inez Catalina M.D.   On: 02/19/2016 09:31   CT CHEST WITHOUT CONTRAST 09/09/2016  IMPRESSION: 1. Multiple bilateral pulmonary nodules as before. 8 mm lobular right upper lobe nodule is clearly progressive when comparing back over the prior 2 studies. Other scattered pulmonary nodules are less obviously progressed since the most recent comparison study but appear larger than on 12/31/2014. 2. Emphysema.   ASSESSMENT & PLAN:   83 year old Caucasian gentleman with   #1 history of left upper lobe bronchoalveolar carcinoma stage I  - T1 and N0 M0. Diagnosed in April 2003 status post left upper lobectomy by Dr. Arlyce Dice.  Patient has no overt clinical evidence of lung cancer recurrence at this time. #2 multiple lung nodules. Slight increase in left lower lobe lung nodules on CT scan. Images are reviewed with the patient and his family. We discussed options on how to proceed. They are still too small to biopsy, realistically may be below PET detection. Have opted to repeat imaging in April and reassess.. #3 history of right sided renal cell carcinoma status post right nephrectomy in April 2001. Has not had any evidence of recurrent renal cell carcinoma. #4 end-stage renal disease on hemodialysis at the [facility].  Plan I have reviewed his CT chest scan in detail with him and his niece today. Results noted above. I have told them that his RUL nodule (31m) has grown since his last visit, and the 13 mm RUL subsolid nodule has become more prominent. I have discussed options including referral to rad-onc for SBRT to the RUL lesions for presumed malignancy vs. Continued close surveillance. Patient opted to continue surveillance.   RTC in 3 months with repeat CT chest scan.  Orders Placed This  Encounter  Procedures  . CT Chest Wo Contrast    Standing Status:   Future    Standing Expiration Date:   09/14/2017    Order Specific Question:   Reason for Exam (SYMPTOM  OR DIAGNOSIS REQUIRED)    Answer:    surveillance of pulm nodules, especially RUL ones which have been growing.    Order Specific Question:   Preferred imaging location?    Answer:   [facility]    Order Specific Question:   Radiology Contrast Protocol - do NOT remove file path    Answer:   [email or web address]      All of the patients questions were answered with apparent satisfaction. The patient knows to call the clinic with any problems, questions or concerns.  This document serves as a record of services personally performed by Twana First, MD. It was created on her behalf by Shirlean Mylar, a trained medical scribe. The creation of this record is based on the scribe's personal observations and the provider's statements to them. This document has been checked and approved by the attending provider.  I have reviewed the above documentation for accuracy and completeness and I agree with the above.  This note is electronically signed.   09/14/2016 3:13 PM

## 2016-09-15 ENCOUNTER — Ambulatory Visit (HOSPITAL_COMMUNITY): Payer: Medicare Other

## 2016-09-15 DIAGNOSIS — D631 Anemia in chronic kidney disease: Secondary | ICD-10-CM | POA: Diagnosis not present

## 2016-09-15 DIAGNOSIS — N186 End stage renal disease: Secondary | ICD-10-CM | POA: Diagnosis not present

## 2016-09-15 DIAGNOSIS — D509 Iron deficiency anemia, unspecified: Secondary | ICD-10-CM | POA: Diagnosis not present

## 2016-09-15 DIAGNOSIS — N2581 Secondary hyperparathyroidism of renal origin: Secondary | ICD-10-CM | POA: Diagnosis not present

## 2016-09-15 DIAGNOSIS — Z992 Dependence on renal dialysis: Secondary | ICD-10-CM | POA: Diagnosis not present

## 2016-09-17 DIAGNOSIS — Z992 Dependence on renal dialysis: Secondary | ICD-10-CM | POA: Diagnosis not present

## 2016-09-17 DIAGNOSIS — N186 End stage renal disease: Secondary | ICD-10-CM | POA: Diagnosis not present

## 2016-09-17 DIAGNOSIS — D631 Anemia in chronic kidney disease: Secondary | ICD-10-CM | POA: Diagnosis not present

## 2016-09-17 DIAGNOSIS — D509 Iron deficiency anemia, unspecified: Secondary | ICD-10-CM | POA: Diagnosis not present

## 2016-09-17 DIAGNOSIS — N2581 Secondary hyperparathyroidism of renal origin: Secondary | ICD-10-CM | POA: Diagnosis not present

## 2016-09-20 DIAGNOSIS — Z992 Dependence on renal dialysis: Secondary | ICD-10-CM | POA: Diagnosis not present

## 2016-09-20 DIAGNOSIS — N2581 Secondary hyperparathyroidism of renal origin: Secondary | ICD-10-CM | POA: Diagnosis not present

## 2016-09-20 DIAGNOSIS — D631 Anemia in chronic kidney disease: Secondary | ICD-10-CM | POA: Diagnosis not present

## 2016-09-20 DIAGNOSIS — N186 End stage renal disease: Secondary | ICD-10-CM | POA: Diagnosis not present

## 2016-09-20 DIAGNOSIS — D509 Iron deficiency anemia, unspecified: Secondary | ICD-10-CM | POA: Diagnosis not present

## 2016-09-22 DIAGNOSIS — N186 End stage renal disease: Secondary | ICD-10-CM | POA: Diagnosis not present

## 2016-09-22 DIAGNOSIS — D631 Anemia in chronic kidney disease: Secondary | ICD-10-CM | POA: Diagnosis not present

## 2016-09-22 DIAGNOSIS — Z992 Dependence on renal dialysis: Secondary | ICD-10-CM | POA: Diagnosis not present

## 2016-09-22 DIAGNOSIS — D509 Iron deficiency anemia, unspecified: Secondary | ICD-10-CM | POA: Diagnosis not present

## 2016-09-22 DIAGNOSIS — N2581 Secondary hyperparathyroidism of renal origin: Secondary | ICD-10-CM | POA: Diagnosis not present

## 2016-09-24 DIAGNOSIS — D509 Iron deficiency anemia, unspecified: Secondary | ICD-10-CM | POA: Diagnosis not present

## 2016-09-24 DIAGNOSIS — N2581 Secondary hyperparathyroidism of renal origin: Secondary | ICD-10-CM | POA: Diagnosis not present

## 2016-09-24 DIAGNOSIS — Z992 Dependence on renal dialysis: Secondary | ICD-10-CM | POA: Diagnosis not present

## 2016-09-24 DIAGNOSIS — N186 End stage renal disease: Secondary | ICD-10-CM | POA: Diagnosis not present

## 2016-09-24 DIAGNOSIS — D631 Anemia in chronic kidney disease: Secondary | ICD-10-CM | POA: Diagnosis not present

## 2016-09-27 DIAGNOSIS — N186 End stage renal disease: Secondary | ICD-10-CM | POA: Diagnosis not present

## 2016-09-27 DIAGNOSIS — N2581 Secondary hyperparathyroidism of renal origin: Secondary | ICD-10-CM | POA: Diagnosis not present

## 2016-09-27 DIAGNOSIS — D631 Anemia in chronic kidney disease: Secondary | ICD-10-CM | POA: Diagnosis not present

## 2016-09-27 DIAGNOSIS — D509 Iron deficiency anemia, unspecified: Secondary | ICD-10-CM | POA: Diagnosis not present

## 2016-09-27 DIAGNOSIS — Z992 Dependence on renal dialysis: Secondary | ICD-10-CM | POA: Diagnosis not present

## 2016-09-29 DIAGNOSIS — N186 End stage renal disease: Secondary | ICD-10-CM | POA: Diagnosis not present

## 2016-09-29 DIAGNOSIS — N2581 Secondary hyperparathyroidism of renal origin: Secondary | ICD-10-CM | POA: Diagnosis not present

## 2016-09-29 DIAGNOSIS — Z992 Dependence on renal dialysis: Secondary | ICD-10-CM | POA: Diagnosis not present

## 2016-09-29 DIAGNOSIS — D631 Anemia in chronic kidney disease: Secondary | ICD-10-CM | POA: Diagnosis not present

## 2016-09-29 DIAGNOSIS — D509 Iron deficiency anemia, unspecified: Secondary | ICD-10-CM | POA: Diagnosis not present

## 2016-10-01 DIAGNOSIS — N2581 Secondary hyperparathyroidism of renal origin: Secondary | ICD-10-CM | POA: Diagnosis not present

## 2016-10-01 DIAGNOSIS — N186 End stage renal disease: Secondary | ICD-10-CM | POA: Diagnosis not present

## 2016-10-01 DIAGNOSIS — D631 Anemia in chronic kidney disease: Secondary | ICD-10-CM | POA: Diagnosis not present

## 2016-10-01 DIAGNOSIS — D509 Iron deficiency anemia, unspecified: Secondary | ICD-10-CM | POA: Diagnosis not present

## 2016-10-01 DIAGNOSIS — Z992 Dependence on renal dialysis: Secondary | ICD-10-CM | POA: Diagnosis not present

## 2016-10-04 DIAGNOSIS — D631 Anemia in chronic kidney disease: Secondary | ICD-10-CM | POA: Diagnosis not present

## 2016-10-04 DIAGNOSIS — D509 Iron deficiency anemia, unspecified: Secondary | ICD-10-CM | POA: Diagnosis not present

## 2016-10-04 DIAGNOSIS — N186 End stage renal disease: Secondary | ICD-10-CM | POA: Diagnosis not present

## 2016-10-04 DIAGNOSIS — Z992 Dependence on renal dialysis: Secondary | ICD-10-CM | POA: Diagnosis not present

## 2016-10-04 DIAGNOSIS — N2581 Secondary hyperparathyroidism of renal origin: Secondary | ICD-10-CM | POA: Diagnosis not present

## 2016-10-06 DIAGNOSIS — N186 End stage renal disease: Secondary | ICD-10-CM | POA: Diagnosis not present

## 2016-10-06 DIAGNOSIS — N2581 Secondary hyperparathyroidism of renal origin: Secondary | ICD-10-CM | POA: Diagnosis not present

## 2016-10-06 DIAGNOSIS — Z992 Dependence on renal dialysis: Secondary | ICD-10-CM | POA: Diagnosis not present

## 2016-10-06 DIAGNOSIS — D509 Iron deficiency anemia, unspecified: Secondary | ICD-10-CM | POA: Diagnosis not present

## 2016-10-06 DIAGNOSIS — D631 Anemia in chronic kidney disease: Secondary | ICD-10-CM | POA: Diagnosis not present

## 2016-10-07 DIAGNOSIS — N186 End stage renal disease: Secondary | ICD-10-CM | POA: Diagnosis not present

## 2016-10-07 DIAGNOSIS — Z992 Dependence on renal dialysis: Secondary | ICD-10-CM | POA: Diagnosis not present

## 2016-10-08 DIAGNOSIS — D631 Anemia in chronic kidney disease: Secondary | ICD-10-CM | POA: Diagnosis not present

## 2016-10-08 DIAGNOSIS — D509 Iron deficiency anemia, unspecified: Secondary | ICD-10-CM | POA: Diagnosis not present

## 2016-10-08 DIAGNOSIS — N2581 Secondary hyperparathyroidism of renal origin: Secondary | ICD-10-CM | POA: Diagnosis not present

## 2016-10-08 DIAGNOSIS — Z992 Dependence on renal dialysis: Secondary | ICD-10-CM | POA: Diagnosis not present

## 2016-10-08 DIAGNOSIS — N186 End stage renal disease: Secondary | ICD-10-CM | POA: Diagnosis not present

## 2016-10-11 DIAGNOSIS — D509 Iron deficiency anemia, unspecified: Secondary | ICD-10-CM | POA: Diagnosis not present

## 2016-10-11 DIAGNOSIS — N2581 Secondary hyperparathyroidism of renal origin: Secondary | ICD-10-CM | POA: Diagnosis not present

## 2016-10-11 DIAGNOSIS — N186 End stage renal disease: Secondary | ICD-10-CM | POA: Diagnosis not present

## 2016-10-11 DIAGNOSIS — Z992 Dependence on renal dialysis: Secondary | ICD-10-CM | POA: Diagnosis not present

## 2016-10-11 DIAGNOSIS — D631 Anemia in chronic kidney disease: Secondary | ICD-10-CM | POA: Diagnosis not present

## 2016-10-13 DIAGNOSIS — D631 Anemia in chronic kidney disease: Secondary | ICD-10-CM | POA: Diagnosis not present

## 2016-10-13 DIAGNOSIS — Z992 Dependence on renal dialysis: Secondary | ICD-10-CM | POA: Diagnosis not present

## 2016-10-13 DIAGNOSIS — N2581 Secondary hyperparathyroidism of renal origin: Secondary | ICD-10-CM | POA: Diagnosis not present

## 2016-10-13 DIAGNOSIS — D509 Iron deficiency anemia, unspecified: Secondary | ICD-10-CM | POA: Diagnosis not present

## 2016-10-13 DIAGNOSIS — N186 End stage renal disease: Secondary | ICD-10-CM | POA: Diagnosis not present

## 2016-10-15 DIAGNOSIS — D509 Iron deficiency anemia, unspecified: Secondary | ICD-10-CM | POA: Diagnosis not present

## 2016-10-15 DIAGNOSIS — N186 End stage renal disease: Secondary | ICD-10-CM | POA: Diagnosis not present

## 2016-10-15 DIAGNOSIS — Z992 Dependence on renal dialysis: Secondary | ICD-10-CM | POA: Diagnosis not present

## 2016-10-15 DIAGNOSIS — D631 Anemia in chronic kidney disease: Secondary | ICD-10-CM | POA: Diagnosis not present

## 2016-10-15 DIAGNOSIS — N2581 Secondary hyperparathyroidism of renal origin: Secondary | ICD-10-CM | POA: Diagnosis not present

## 2016-10-18 DIAGNOSIS — D631 Anemia in chronic kidney disease: Secondary | ICD-10-CM | POA: Diagnosis not present

## 2016-10-18 DIAGNOSIS — N2581 Secondary hyperparathyroidism of renal origin: Secondary | ICD-10-CM | POA: Diagnosis not present

## 2016-10-18 DIAGNOSIS — N186 End stage renal disease: Secondary | ICD-10-CM | POA: Diagnosis not present

## 2016-10-18 DIAGNOSIS — Z992 Dependence on renal dialysis: Secondary | ICD-10-CM | POA: Diagnosis not present

## 2016-10-18 DIAGNOSIS — H1032 Unspecified acute conjunctivitis, left eye: Secondary | ICD-10-CM | POA: Diagnosis not present

## 2016-10-18 DIAGNOSIS — D509 Iron deficiency anemia, unspecified: Secondary | ICD-10-CM | POA: Diagnosis not present

## 2016-10-20 DIAGNOSIS — N2581 Secondary hyperparathyroidism of renal origin: Secondary | ICD-10-CM | POA: Diagnosis not present

## 2016-10-20 DIAGNOSIS — N186 End stage renal disease: Secondary | ICD-10-CM | POA: Diagnosis not present

## 2016-10-20 DIAGNOSIS — D509 Iron deficiency anemia, unspecified: Secondary | ICD-10-CM | POA: Diagnosis not present

## 2016-10-20 DIAGNOSIS — D631 Anemia in chronic kidney disease: Secondary | ICD-10-CM | POA: Diagnosis not present

## 2016-10-20 DIAGNOSIS — Z992 Dependence on renal dialysis: Secondary | ICD-10-CM | POA: Diagnosis not present

## 2016-10-22 DIAGNOSIS — D631 Anemia in chronic kidney disease: Secondary | ICD-10-CM | POA: Diagnosis not present

## 2016-10-22 DIAGNOSIS — N186 End stage renal disease: Secondary | ICD-10-CM | POA: Diagnosis not present

## 2016-10-22 DIAGNOSIS — Z992 Dependence on renal dialysis: Secondary | ICD-10-CM | POA: Diagnosis not present

## 2016-10-22 DIAGNOSIS — D509 Iron deficiency anemia, unspecified: Secondary | ICD-10-CM | POA: Diagnosis not present

## 2016-10-22 DIAGNOSIS — N2581 Secondary hyperparathyroidism of renal origin: Secondary | ICD-10-CM | POA: Diagnosis not present

## 2016-10-25 DIAGNOSIS — N186 End stage renal disease: Secondary | ICD-10-CM | POA: Diagnosis not present

## 2016-10-25 DIAGNOSIS — D631 Anemia in chronic kidney disease: Secondary | ICD-10-CM | POA: Diagnosis not present

## 2016-10-25 DIAGNOSIS — Z992 Dependence on renal dialysis: Secondary | ICD-10-CM | POA: Diagnosis not present

## 2016-10-25 DIAGNOSIS — D509 Iron deficiency anemia, unspecified: Secondary | ICD-10-CM | POA: Diagnosis not present

## 2016-10-25 DIAGNOSIS — N2581 Secondary hyperparathyroidism of renal origin: Secondary | ICD-10-CM | POA: Diagnosis not present

## 2016-10-27 DIAGNOSIS — Z992 Dependence on renal dialysis: Secondary | ICD-10-CM | POA: Diagnosis not present

## 2016-10-27 DIAGNOSIS — D631 Anemia in chronic kidney disease: Secondary | ICD-10-CM | POA: Diagnosis not present

## 2016-10-27 DIAGNOSIS — N2581 Secondary hyperparathyroidism of renal origin: Secondary | ICD-10-CM | POA: Diagnosis not present

## 2016-10-27 DIAGNOSIS — N186 End stage renal disease: Secondary | ICD-10-CM | POA: Diagnosis not present

## 2016-10-27 DIAGNOSIS — D509 Iron deficiency anemia, unspecified: Secondary | ICD-10-CM | POA: Diagnosis not present

## 2016-10-29 DIAGNOSIS — D509 Iron deficiency anemia, unspecified: Secondary | ICD-10-CM | POA: Diagnosis not present

## 2016-10-29 DIAGNOSIS — N186 End stage renal disease: Secondary | ICD-10-CM | POA: Diagnosis not present

## 2016-10-29 DIAGNOSIS — D631 Anemia in chronic kidney disease: Secondary | ICD-10-CM | POA: Diagnosis not present

## 2016-10-29 DIAGNOSIS — Z992 Dependence on renal dialysis: Secondary | ICD-10-CM | POA: Diagnosis not present

## 2016-10-29 DIAGNOSIS — N2581 Secondary hyperparathyroidism of renal origin: Secondary | ICD-10-CM | POA: Diagnosis not present

## 2016-11-01 DIAGNOSIS — N186 End stage renal disease: Secondary | ICD-10-CM | POA: Diagnosis not present

## 2016-11-01 DIAGNOSIS — D509 Iron deficiency anemia, unspecified: Secondary | ICD-10-CM | POA: Diagnosis not present

## 2016-11-01 DIAGNOSIS — Z992 Dependence on renal dialysis: Secondary | ICD-10-CM | POA: Diagnosis not present

## 2016-11-01 DIAGNOSIS — N2581 Secondary hyperparathyroidism of renal origin: Secondary | ICD-10-CM | POA: Diagnosis not present

## 2016-11-01 DIAGNOSIS — D631 Anemia in chronic kidney disease: Secondary | ICD-10-CM | POA: Diagnosis not present

## 2016-11-03 DIAGNOSIS — D631 Anemia in chronic kidney disease: Secondary | ICD-10-CM | POA: Diagnosis not present

## 2016-11-03 DIAGNOSIS — N2581 Secondary hyperparathyroidism of renal origin: Secondary | ICD-10-CM | POA: Diagnosis not present

## 2016-11-03 DIAGNOSIS — N186 End stage renal disease: Secondary | ICD-10-CM | POA: Diagnosis not present

## 2016-11-03 DIAGNOSIS — Z992 Dependence on renal dialysis: Secondary | ICD-10-CM | POA: Diagnosis not present

## 2016-11-03 DIAGNOSIS — D509 Iron deficiency anemia, unspecified: Secondary | ICD-10-CM | POA: Diagnosis not present

## 2016-11-05 DIAGNOSIS — D509 Iron deficiency anemia, unspecified: Secondary | ICD-10-CM | POA: Diagnosis not present

## 2016-11-05 DIAGNOSIS — N2581 Secondary hyperparathyroidism of renal origin: Secondary | ICD-10-CM | POA: Diagnosis not present

## 2016-11-05 DIAGNOSIS — N186 End stage renal disease: Secondary | ICD-10-CM | POA: Diagnosis not present

## 2016-11-05 DIAGNOSIS — D631 Anemia in chronic kidney disease: Secondary | ICD-10-CM | POA: Diagnosis not present

## 2016-11-05 DIAGNOSIS — Z992 Dependence on renal dialysis: Secondary | ICD-10-CM | POA: Diagnosis not present

## 2016-11-06 DIAGNOSIS — N186 End stage renal disease: Secondary | ICD-10-CM | POA: Diagnosis not present

## 2016-11-06 DIAGNOSIS — Z992 Dependence on renal dialysis: Secondary | ICD-10-CM | POA: Diagnosis not present

## 2016-11-08 DIAGNOSIS — D631 Anemia in chronic kidney disease: Secondary | ICD-10-CM | POA: Diagnosis not present

## 2016-11-08 DIAGNOSIS — N186 End stage renal disease: Secondary | ICD-10-CM | POA: Diagnosis not present

## 2016-11-08 DIAGNOSIS — N2581 Secondary hyperparathyroidism of renal origin: Secondary | ICD-10-CM | POA: Diagnosis not present

## 2016-11-08 DIAGNOSIS — Z992 Dependence on renal dialysis: Secondary | ICD-10-CM | POA: Diagnosis not present

## 2016-11-08 DIAGNOSIS — D509 Iron deficiency anemia, unspecified: Secondary | ICD-10-CM | POA: Diagnosis not present

## 2016-11-10 DIAGNOSIS — D631 Anemia in chronic kidney disease: Secondary | ICD-10-CM | POA: Diagnosis not present

## 2016-11-10 DIAGNOSIS — N2581 Secondary hyperparathyroidism of renal origin: Secondary | ICD-10-CM | POA: Diagnosis not present

## 2016-11-10 DIAGNOSIS — N186 End stage renal disease: Secondary | ICD-10-CM | POA: Diagnosis not present

## 2016-11-10 DIAGNOSIS — D509 Iron deficiency anemia, unspecified: Secondary | ICD-10-CM | POA: Diagnosis not present

## 2016-11-10 DIAGNOSIS — Z992 Dependence on renal dialysis: Secondary | ICD-10-CM | POA: Diagnosis not present

## 2016-11-12 DIAGNOSIS — N186 End stage renal disease: Secondary | ICD-10-CM | POA: Diagnosis not present

## 2016-11-12 DIAGNOSIS — D631 Anemia in chronic kidney disease: Secondary | ICD-10-CM | POA: Diagnosis not present

## 2016-11-12 DIAGNOSIS — N2581 Secondary hyperparathyroidism of renal origin: Secondary | ICD-10-CM | POA: Diagnosis not present

## 2016-11-12 DIAGNOSIS — D509 Iron deficiency anemia, unspecified: Secondary | ICD-10-CM | POA: Diagnosis not present

## 2016-11-12 DIAGNOSIS — Z992 Dependence on renal dialysis: Secondary | ICD-10-CM | POA: Diagnosis not present

## 2016-11-15 DIAGNOSIS — N186 End stage renal disease: Secondary | ICD-10-CM | POA: Diagnosis not present

## 2016-11-15 DIAGNOSIS — N2581 Secondary hyperparathyroidism of renal origin: Secondary | ICD-10-CM | POA: Diagnosis not present

## 2016-11-15 DIAGNOSIS — Z992 Dependence on renal dialysis: Secondary | ICD-10-CM | POA: Diagnosis not present

## 2016-11-15 DIAGNOSIS — D631 Anemia in chronic kidney disease: Secondary | ICD-10-CM | POA: Diagnosis not present

## 2016-11-15 DIAGNOSIS — D509 Iron deficiency anemia, unspecified: Secondary | ICD-10-CM | POA: Diagnosis not present

## 2016-11-17 DIAGNOSIS — N2581 Secondary hyperparathyroidism of renal origin: Secondary | ICD-10-CM | POA: Diagnosis not present

## 2016-11-17 DIAGNOSIS — N186 End stage renal disease: Secondary | ICD-10-CM | POA: Diagnosis not present

## 2016-11-17 DIAGNOSIS — Z992 Dependence on renal dialysis: Secondary | ICD-10-CM | POA: Diagnosis not present

## 2016-11-17 DIAGNOSIS — D509 Iron deficiency anemia, unspecified: Secondary | ICD-10-CM | POA: Diagnosis not present

## 2016-11-17 DIAGNOSIS — D631 Anemia in chronic kidney disease: Secondary | ICD-10-CM | POA: Diagnosis not present

## 2016-11-19 DIAGNOSIS — D631 Anemia in chronic kidney disease: Secondary | ICD-10-CM | POA: Diagnosis not present

## 2016-11-19 DIAGNOSIS — N186 End stage renal disease: Secondary | ICD-10-CM | POA: Diagnosis not present

## 2016-11-19 DIAGNOSIS — Z992 Dependence on renal dialysis: Secondary | ICD-10-CM | POA: Diagnosis not present

## 2016-11-19 DIAGNOSIS — N2581 Secondary hyperparathyroidism of renal origin: Secondary | ICD-10-CM | POA: Diagnosis not present

## 2016-11-19 DIAGNOSIS — D509 Iron deficiency anemia, unspecified: Secondary | ICD-10-CM | POA: Diagnosis not present

## 2016-11-22 DIAGNOSIS — N186 End stage renal disease: Secondary | ICD-10-CM | POA: Diagnosis not present

## 2016-11-22 DIAGNOSIS — D631 Anemia in chronic kidney disease: Secondary | ICD-10-CM | POA: Diagnosis not present

## 2016-11-22 DIAGNOSIS — Z992 Dependence on renal dialysis: Secondary | ICD-10-CM | POA: Diagnosis not present

## 2016-11-22 DIAGNOSIS — N2581 Secondary hyperparathyroidism of renal origin: Secondary | ICD-10-CM | POA: Diagnosis not present

## 2016-11-22 DIAGNOSIS — D509 Iron deficiency anemia, unspecified: Secondary | ICD-10-CM | POA: Diagnosis not present

## 2016-11-24 DIAGNOSIS — Z992 Dependence on renal dialysis: Secondary | ICD-10-CM | POA: Diagnosis not present

## 2016-11-24 DIAGNOSIS — N186 End stage renal disease: Secondary | ICD-10-CM | POA: Diagnosis not present

## 2016-11-24 DIAGNOSIS — D631 Anemia in chronic kidney disease: Secondary | ICD-10-CM | POA: Diagnosis not present

## 2016-11-24 DIAGNOSIS — N2581 Secondary hyperparathyroidism of renal origin: Secondary | ICD-10-CM | POA: Diagnosis not present

## 2016-11-24 DIAGNOSIS — D509 Iron deficiency anemia, unspecified: Secondary | ICD-10-CM | POA: Diagnosis not present

## 2016-11-26 DIAGNOSIS — Z992 Dependence on renal dialysis: Secondary | ICD-10-CM | POA: Diagnosis not present

## 2016-11-26 DIAGNOSIS — N186 End stage renal disease: Secondary | ICD-10-CM | POA: Diagnosis not present

## 2016-11-26 DIAGNOSIS — D509 Iron deficiency anemia, unspecified: Secondary | ICD-10-CM | POA: Diagnosis not present

## 2016-11-26 DIAGNOSIS — D631 Anemia in chronic kidney disease: Secondary | ICD-10-CM | POA: Diagnosis not present

## 2016-11-26 DIAGNOSIS — N2581 Secondary hyperparathyroidism of renal origin: Secondary | ICD-10-CM | POA: Diagnosis not present

## 2016-11-29 DIAGNOSIS — D631 Anemia in chronic kidney disease: Secondary | ICD-10-CM | POA: Diagnosis not present

## 2016-11-29 DIAGNOSIS — Z992 Dependence on renal dialysis: Secondary | ICD-10-CM | POA: Diagnosis not present

## 2016-11-29 DIAGNOSIS — N186 End stage renal disease: Secondary | ICD-10-CM | POA: Diagnosis not present

## 2016-11-29 DIAGNOSIS — N2581 Secondary hyperparathyroidism of renal origin: Secondary | ICD-10-CM | POA: Diagnosis not present

## 2016-11-29 DIAGNOSIS — D509 Iron deficiency anemia, unspecified: Secondary | ICD-10-CM | POA: Diagnosis not present

## 2016-12-01 DIAGNOSIS — N186 End stage renal disease: Secondary | ICD-10-CM | POA: Diagnosis not present

## 2016-12-01 DIAGNOSIS — D631 Anemia in chronic kidney disease: Secondary | ICD-10-CM | POA: Diagnosis not present

## 2016-12-01 DIAGNOSIS — N2581 Secondary hyperparathyroidism of renal origin: Secondary | ICD-10-CM | POA: Diagnosis not present

## 2016-12-01 DIAGNOSIS — Z992 Dependence on renal dialysis: Secondary | ICD-10-CM | POA: Diagnosis not present

## 2016-12-01 DIAGNOSIS — D509 Iron deficiency anemia, unspecified: Secondary | ICD-10-CM | POA: Diagnosis not present

## 2016-12-03 DIAGNOSIS — N2581 Secondary hyperparathyroidism of renal origin: Secondary | ICD-10-CM | POA: Diagnosis not present

## 2016-12-03 DIAGNOSIS — Z992 Dependence on renal dialysis: Secondary | ICD-10-CM | POA: Diagnosis not present

## 2016-12-03 DIAGNOSIS — D509 Iron deficiency anemia, unspecified: Secondary | ICD-10-CM | POA: Diagnosis not present

## 2016-12-03 DIAGNOSIS — D631 Anemia in chronic kidney disease: Secondary | ICD-10-CM | POA: Diagnosis not present

## 2016-12-03 DIAGNOSIS — N186 End stage renal disease: Secondary | ICD-10-CM | POA: Diagnosis not present

## 2016-12-06 DIAGNOSIS — D631 Anemia in chronic kidney disease: Secondary | ICD-10-CM | POA: Diagnosis not present

## 2016-12-06 DIAGNOSIS — D509 Iron deficiency anemia, unspecified: Secondary | ICD-10-CM | POA: Diagnosis not present

## 2016-12-06 DIAGNOSIS — N186 End stage renal disease: Secondary | ICD-10-CM | POA: Diagnosis not present

## 2016-12-06 DIAGNOSIS — N2581 Secondary hyperparathyroidism of renal origin: Secondary | ICD-10-CM | POA: Diagnosis not present

## 2016-12-06 DIAGNOSIS — Z992 Dependence on renal dialysis: Secondary | ICD-10-CM | POA: Diagnosis not present

## 2016-12-07 DIAGNOSIS — Z992 Dependence on renal dialysis: Secondary | ICD-10-CM | POA: Diagnosis not present

## 2016-12-07 DIAGNOSIS — N186 End stage renal disease: Secondary | ICD-10-CM | POA: Diagnosis not present

## 2016-12-08 DIAGNOSIS — N2581 Secondary hyperparathyroidism of renal origin: Secondary | ICD-10-CM | POA: Diagnosis not present

## 2016-12-08 DIAGNOSIS — N186 End stage renal disease: Secondary | ICD-10-CM | POA: Diagnosis not present

## 2016-12-08 DIAGNOSIS — D509 Iron deficiency anemia, unspecified: Secondary | ICD-10-CM | POA: Diagnosis not present

## 2016-12-08 DIAGNOSIS — D631 Anemia in chronic kidney disease: Secondary | ICD-10-CM | POA: Diagnosis not present

## 2016-12-08 DIAGNOSIS — Z992 Dependence on renal dialysis: Secondary | ICD-10-CM | POA: Diagnosis not present

## 2016-12-10 DIAGNOSIS — Z992 Dependence on renal dialysis: Secondary | ICD-10-CM | POA: Diagnosis not present

## 2016-12-10 DIAGNOSIS — N2581 Secondary hyperparathyroidism of renal origin: Secondary | ICD-10-CM | POA: Diagnosis not present

## 2016-12-10 DIAGNOSIS — D631 Anemia in chronic kidney disease: Secondary | ICD-10-CM | POA: Diagnosis not present

## 2016-12-10 DIAGNOSIS — D509 Iron deficiency anemia, unspecified: Secondary | ICD-10-CM | POA: Diagnosis not present

## 2016-12-10 DIAGNOSIS — N186 End stage renal disease: Secondary | ICD-10-CM | POA: Diagnosis not present

## 2016-12-13 DIAGNOSIS — D509 Iron deficiency anemia, unspecified: Secondary | ICD-10-CM | POA: Diagnosis not present

## 2016-12-13 DIAGNOSIS — D631 Anemia in chronic kidney disease: Secondary | ICD-10-CM | POA: Diagnosis not present

## 2016-12-13 DIAGNOSIS — Z992 Dependence on renal dialysis: Secondary | ICD-10-CM | POA: Diagnosis not present

## 2016-12-13 DIAGNOSIS — N2581 Secondary hyperparathyroidism of renal origin: Secondary | ICD-10-CM | POA: Diagnosis not present

## 2016-12-13 DIAGNOSIS — N186 End stage renal disease: Secondary | ICD-10-CM | POA: Diagnosis not present

## 2016-12-14 ENCOUNTER — Ambulatory Visit (HOSPITAL_COMMUNITY)
Admission: RE | Admit: 2016-12-14 | Discharge: 2016-12-14 | Disposition: A | Discharge status: Home / Self Care | Payer: Medicare Other | Source: Ambulatory Visit | Attending: Oncology | Admitting: Oncology

## 2016-12-14 DIAGNOSIS — Z905 Acquired absence of kidney: Secondary | ICD-10-CM | POA: Diagnosis not present

## 2016-12-14 DIAGNOSIS — C3492 Malignant neoplasm of unspecified part of left bronchus or lung: Secondary | ICD-10-CM | POA: Diagnosis not present

## 2016-12-14 DIAGNOSIS — Z85528 Personal history of other malignant neoplasm of kidney: Secondary | ICD-10-CM | POA: Diagnosis not present

## 2016-12-14 DIAGNOSIS — N281 Cyst of kidney, acquired: Secondary | ICD-10-CM | POA: Diagnosis not present

## 2016-12-14 DIAGNOSIS — R918 Other nonspecific abnormal finding of lung field: Secondary | ICD-10-CM | POA: Diagnosis not present

## 2016-12-14 DIAGNOSIS — Z9049 Acquired absence of other specified parts of digestive tract: Secondary | ICD-10-CM | POA: Insufficient documentation

## 2016-12-14 DIAGNOSIS — J432 Centrilobular emphysema: Secondary | ICD-10-CM | POA: Insufficient documentation

## 2016-12-14 DIAGNOSIS — I2510 Atherosclerotic heart disease of native coronary artery without angina pectoris: Secondary | ICD-10-CM | POA: Diagnosis not present

## 2016-12-14 DIAGNOSIS — M47814 Spondylosis without myelopathy or radiculopathy, thoracic region: Secondary | ICD-10-CM | POA: Insufficient documentation

## 2016-12-14 DIAGNOSIS — I700 Atherosclerosis of aorta: Secondary | ICD-10-CM | POA: Insufficient documentation

## 2016-12-14 DIAGNOSIS — Z902 Acquired absence of lung [part of]: Secondary | ICD-10-CM | POA: Insufficient documentation

## 2016-12-15 ENCOUNTER — Ambulatory Visit (HOSPITAL_COMMUNITY): Payer: Self-pay

## 2016-12-15 DIAGNOSIS — N186 End stage renal disease: Secondary | ICD-10-CM | POA: Diagnosis not present

## 2016-12-15 DIAGNOSIS — D631 Anemia in chronic kidney disease: Secondary | ICD-10-CM | POA: Diagnosis not present

## 2016-12-15 DIAGNOSIS — D509 Iron deficiency anemia, unspecified: Secondary | ICD-10-CM | POA: Diagnosis not present

## 2016-12-15 DIAGNOSIS — N2581 Secondary hyperparathyroidism of renal origin: Secondary | ICD-10-CM | POA: Diagnosis not present

## 2016-12-15 DIAGNOSIS — Z992 Dependence on renal dialysis: Secondary | ICD-10-CM | POA: Diagnosis not present

## 2016-12-16 ENCOUNTER — Encounter (HOSPITAL_COMMUNITY): Payer: Self-pay | Admitting: Oncology

## 2016-12-16 ENCOUNTER — Encounter (HOSPITAL_COMMUNITY): Payer: Medicare Other | Attending: Oncology | Admitting: Oncology

## 2016-12-16 VITALS — BP 157/70 | HR 80 | Resp 16 | Ht 66.0 in | Wt 174.2 lb

## 2016-12-16 DIAGNOSIS — C3492 Malignant neoplasm of unspecified part of left bronchus or lung: Secondary | ICD-10-CM | POA: Insufficient documentation

## 2016-12-16 DIAGNOSIS — N186 End stage renal disease: Secondary | ICD-10-CM | POA: Diagnosis not present

## 2016-12-16 DIAGNOSIS — Z905 Acquired absence of kidney: Secondary | ICD-10-CM

## 2016-12-16 DIAGNOSIS — Z992 Dependence on renal dialysis: Secondary | ICD-10-CM

## 2016-12-16 DIAGNOSIS — Z85118 Personal history of other malignant neoplasm of bronchus and lung: Secondary | ICD-10-CM

## 2016-12-16 DIAGNOSIS — Z85528 Personal history of other malignant neoplasm of kidney: Secondary | ICD-10-CM | POA: Diagnosis not present

## 2016-12-16 DIAGNOSIS — R918 Other nonspecific abnormal finding of lung field: Secondary | ICD-10-CM | POA: Diagnosis not present

## 2016-12-16 NOTE — Progress Notes (Signed)
[name] HEMATOLOGY/ONCOLOGY PROGRESS NOTE  Date of Service: 12/16/2016  Patient Care Team: Sharilyn Sites, MD as PCP - General (Family Medicine) Fran Lowes, MD as Attending Physician (Nephrology) Daryll Brod, MD as Consulting Physician (Orthopedic Surgery) Conrad Abbeville, MD as Consulting Physician (Vascular Surgery) Rourk, Cristopher Estimable, MD as Consulting Physician (Gastroenterology)  CHIEF COMPLAINTS/PURPOSE OF CONSULTATION:  H/o Lung cancer H/o Kidney cancer  HISTORY OF PRESENTING ILLNESS:   [name] is a wonderful 83 y.o. male is here for a followup for his h/o lung cancer. He also has a history of renal cell carcinoma.  Patient has a history of chronic inflammatory  Demyelinating neuropathy which was diagnosed in 1992 and which needed treatment with prednisone for more than 17 years.  He reports that he presented with abdominal discomfort and nausea in 2001 and had an ultrasound of the abdomen to rule out a gallbladder problem and was incidentally noted to have a right kidney renal cell carcinoma for which he had a right nephrectomy on 08/31/1999. 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 Arlyce Dice. Patient has been getting monitored postoperatively and thus far has not had a recurrence of his lung cancer. He was being followed by Dr. Everardo All and was last seen in clinic on 12/30/2015. He had a CT of the chest on 12/31/2015 which showed a 1 mm increase in the size of now a 3 mm left lower lobe lung parenchymal nodule. He has several other bilateral lung nodules which have  otherwise been stable.  CT Chest from 09/09/2016 demonstrated multiple bilateral pulmonary nodules as before. 8 mm lobular right upper lobe nodule is clearly progressive when comparing back over the prior 2 studies. Other scattered pulmonary nodules are less obviously progressed since the most recent comparison study but appear larger than on 12/31/2014.  INTERVAL HISTORY: Patient presents for continued follow-up. He states he has been doing well. He has baseline shortness breath with exertion which has not worsened since his last visit. He denies any headaches, chest pain, abdominal pain, changes in appetite, weight loss, focal weakness. He states he still feels fatigued after his dialysis treatments.  MEDICAL HISTORY:   Past Medical History:  Diagnosis Date  . Arthritis    ankle , hip, knees , low back   . BPH (benign prostatic hypertrophy)   . Cancer ([place])    lung  . Cancer ([place]) 2001   renal  . Chronic inflammatory demyelinating polyneuropathy ([place]) 04/07/11   ,diagnosed 1992, tx. /w prednisone x 17 yrs. has been d/c for 3 yrs.   . Chronic kidney disease       . COPD (chronic obstructive pulmonary disease) ([place])    told that he has "a bit of emphysema"  . Dialysis patient ([place])   . GERD (gastroesophageal reflux disease)   . Hypertension    stress test done - many years ago  . Kidney calculus    SURGICAL HISTORY:  Past Surgical History:  Procedure Laterality Date  . ANKLE FUSION     R ankle  . AV FISTULA PLACEMENT  06/01/2011  Procedure: ARTERIOVENOUS (AV) FISTULA CREATION;  Surgeon: Angelia Mould, MD;  Location: [facility] OR;  Service: Vascular;  Laterality: Left;  Creation of Left Arteriovenous fistula  . AV FISTULA PLACEMENT Left 07/12/2012   Procedure: ARTERIOVENOUS (AV) FISTULA CREATION;  Surgeon: Conrad Stanaford, MD;  Location: [place];  Service: Vascular;  Laterality: Left;  . AV FISTULA PLACEMENT Left 4.11.14  . BASCILIC VEIN TRANSPOSITION Left 08/28/2012   Procedure:  BASCILIC VEIN TRANSPOSITION;  Surgeon: Conrad Oakwood, MD;  Location: [place];  Service: Vascular;  Laterality: Left;  left 2nd stage basilic vein transposition  . CHOLECYSTECTOMY    . COLONOSCOPY  2005   Dr. Laural Golden, diverticulosis, hemorrhoids, 2 small polyps  . FISTULOGRAM N/A 08/16/2011   Procedure: FISTULOGRAM;  Surgeon: Angelia Mould, MD;  Location: [facility] CATH LAB;  Service: Cardiovascular;  Laterality: N/A;  . fistulogram left radial cephalic AV fistula  [phone]     Dr. Scot Dock  . HIP ARTHROPLASTY Right    "partial"  . JOINT REPLACEMENT     L knee- 2010- [facility], L hip partial replacement   . LUNG REMOVAL, PARTIAL  2003   partial, right  . NEPHRECTOMY  2001   left  . SHUNTOGRAM N/A 05/15/2012   Procedure: Earney Mallet;  Surgeon: Conrad Turbeville, MD;  Location: [facility] CATH LAB;  Service: Cardiovascular;  Laterality: N/A;  . SHUNTOGRAM N/A 05/09/2014   Procedure: FISTULOGRAM;  Surgeon: Conrad , MD;  Location: [facility] CATH LAB;  Service: Cardiovascular;  Laterality: N/A;    SOCIAL HISTORY: Social History   Social History  . Marital status: Married    Spouse name: N/A  . Number of children: N/A  . Years of education: N/A   Occupational History  . Not on file.   Social History Main Topics  . Smoking status: Former Smoker    Years: 35.00    Types: Cigarettes    Quit date: 02/04/1977  . Smokeless tobacco: Current User    Types: Chew     Comment: Quit in 1978  . Alcohol use No  . Drug use: No  . Sexual activity: Not on file   Other Topics Concern  . Not on file   Social History Narrative  . No narrative on file    FAMILY HISTORY: Family History  Problem Relation Age of Onset  . Heart disease Mother   . Heart disease Father   . Heart attack Father   . Anesthesia problems Neg Hx   . Hypotension Neg Hx   . Malignant hyperthermia Neg Hx   . Pseudochol deficiency Neg Hx   . Colon cancer Neg Hx     ALLERGIES:  is allergic to nsaids.  MEDICATIONS:  Current Outpatient  Prescriptions  Medication Sig Dispense Refill  . Cholecalciferol (VITAMIN D) 1000 UNITS capsule Take 5,000 Units by mouth every morning.     [name] lanthanum (FOSRENOL) 1000 MG chewable tablet Chew 1,000 mg by mouth 3 (three) times daily with meals. Takes 1/2 tablet with snacks    . metoprolol succinate (TOPROL-XL) 25 MG 24 hr tablet Take 25 mg by mouth every morning.      No current facility-administered medications for this visit.     REVIEW OF SYSTEMS:    Review of Systems  Constitutional: Positive for malaise/fatigue.  HENT: Negative.   Eyes: Negative.   Respiratory: Positive for shortness of breath (worsened since last visit).   Cardiovascular: Negative.  Negative for chest pain and leg swelling.  Gastrointestinal: Negative.  Negative  for abdominal pain.  Genitourinary: Negative.   Musculoskeletal: Negative.   Skin: Negative.   Neurological: Negative.   Endo/Heme/Allergies: Negative.   Psychiatric/Behavioral: Negative.   All other systems reviewed and are negative.  14 point review of systems was performed and is negative except as detailed under history of present illness and above   PHYSICAL EXAMINATION: ECOG PERFORMANCE STATUS: 2 - Symptomatic, <50% confined to bed  . There were no vitals filed for this visit. There were no vitals filed for this visit.   Physical Exam  Constitutional: He is oriented to person, place, and time and well-developed, well-nourished, and in no distress.  HENT:  Head: Normocephalic and atraumatic.  Eyes: Pupils are equal, round, and reactive to light. Conjunctivae and EOM are normal.  Neck: Normal range of motion. Neck supple.  Cardiovascular: Normal rate, regular rhythm and normal heart sounds.   Pulmonary/Chest: Effort normal and breath sounds normal.  Abdominal: Soft. Bowel sounds are normal.  Musculoskeletal: Normal range of motion.  Neurological: He is alert and oriented to person, place, and time. Gait normal.  Skin: Skin is warm and  dry.  Nursing note and vitals reviewed.   LABORATORY DATA:  I have reviewed the data as listed  . CBC Latest Ref Rng & Units 05/09/2014 08/29/2012 08/28/2012  WBC 4.0 - 10.5 K/uL - 9.3 -  Hemoglobin 13.0 - 17.0 g/dL 11.2(L) 9.3(L) 9.5(L)  Hematocrit 39.0 - 52.0 % 33.0(L) 27.6(L) 28.0(L)  Platelets 150 - 400 K/uL - 160 -    . CMP Latest Ref Rng & Units 05/09/2014 10/26/2013 08/29/2012  Glucose 70 - 99 mg/dL 104(H) - 119(H)  BUN 6 - 23 mg/dL 36(H) - 60(H)  Creatinine 0.50 - 1.35 mg/dL 5.60(H) - 5.29(H)  Sodium 135 - 145 mmol/L 139 - 136  Potassium 3.5 - 5.1 mmol/L 3.9 - 4.6  Chloride 96 - 112 mEq/L 100 - 104  CO2 19 - 32 mEq/L - - 20  Calcium 8.4 - 10.5 mg/dL - - 8.3(L)  Total Protein 6.0 - 8.3 g/dL - 6.0 -  Total Bilirubin 0.3 - 1.2 mg/dL - - -  Alkaline Phos 39 - 117 U/L - - -  AST 0 - 37 U/L - - -  ALT 0 - 53 U/L - - -     RADIOGRAPHIC STUDIES: I have personally reviewed the radiological images as listed and agreed with the findings in the report.  Study Result   CLINICAL DATA:  History of lung carcinoma and partial right  EXAM: CT CHEST WITHOUT CONTRAST  TECHNIQUE: Multidetector CT imaging of the chest was performed following the standard protocol without IV contrast.  COMPARISON:  12/31/2014  FINDINGS: Cardiovascular: Somewhat limited due to the lack of IV contrast. Aortic calcifications are seen without aneurysmal dilatation. Mild coronary calcifications are seen.  Mediastinum/Nodes: The thoracic inlet is within normal limits. Some small scattered mediastinal lymph nodes are seen. Largest of these lies in the AP window and measures 8 mm in short axis. This is relatively stable from the prior exam. No definitive hilar adenopathy is noted.  Lungs/Pleura: The lungs are again well aerated. There are changes consistent with prior left upper lobectomy consistent with the patient's given clinical history.  On the right common there are multiple  parenchymal nodules identified. A 6 mm nodule is noted in the lateral aspect of the right upper lobe best seen on image number 42 of series 3. This has increased in the interval from the prior exam at which time it  measured 4 mm. Multiple other nodules are noted throughout the right lung. The largest of these lies in the right lower lobe and measures 9 mm which is an increase of 3 mm from the prior exam.  On the left, multiple small parenchymal nodules are noted as well as some calcified granulomas. The largest of these nodules lies in the left lower lobe and measures 6 mm. This is an increase of 3 mm from the prior exam.  No sizable effusion or pneumothorax is noted.  Upper Abdomen: There are changes consistent with prior right nephrectomy and cholecystectomy. Diverticular change of the colon is noted without diverticulitis. Hepatic granuloma is noted.  Musculoskeletal: Degenerative change of the thoracic spine is noted. Postoperative changes in the left chest wall are noted.  IMPRESSION: Increase in bilateral parenchymal nodules particularly on the right with increased number and size. The largest of these again lies in the lower lobe on the right measuring mm and increased in size from 6 mm on the prior exam. Doubling of the largest nodule in the left lower lobe is noted from 3-6 mm. This increase in size must be viewed as a progression in disease and given the multiplicity of nodules likely represent metastatic disease. None of these are amenable to percutaneous biopsy at this time.  Postoperative changes consistent with the known history.  These results will be called to the ordering clinician or representative by the Radiologist Assistant, and communication documented in the PACS or zVision Dashboard.   Electronically Signed   By: Inez Catalina M.D.   On: 02/19/2016 09:31   CT CHEST WITHOUT CONTRAST 09/09/2016  IMPRESSION: 1. Multiple bilateral pulmonary  nodules as before. 8 mm lobular right upper lobe nodule is clearly progressive when comparing back over the prior 2 studies. Other scattered pulmonary nodules are less obviously progressed since the most recent comparison study but appear larger than on 12/31/2014. 2. Emphysema. CT Chest Wo Contrast (Accession [id number]) (Order [id number])  Imaging  Date: 12/14/2016 Department: [facility] Released By: Georgianne Fick Authorizing: Twana First, MD  Exam Information   Status Exam Begun  Exam Ended   Final [99] 12/14/2016 9:44 AM 12/14/2016 9:48 AM  PACS Images   Show images for CT Chest Wo Contrast  Study Result   CLINICAL DATA:  Status post left upper lobectomy in 2003 for non-small cell lung carcinoma. Status post right nephrectomy in 2001 for renal cell carcinoma. Patient presents for follow-up of pulmonary nodules.  EXAM: CT CHEST WITHOUT CONTRAST  TECHNIQUE: Multidetector CT imaging of the chest was performed following the standard protocol without IV contrast.  COMPARISON:  09/09/2016 chest CT.  FINDINGS: Cardiovascular: Normal heart size. No significant pericardial fluid/thickening. Left main, left anterior descending, left circumflex and right coronary atherosclerosis. Atherosclerotic nonaneurysmal thoracic aorta. Normal caliber pulmonary arteries.  Mediastinum/Nodes: No discrete thyroid nodules. Unremarkable esophagus. No pathologically enlarged axillary, mediastinal or gross hilar lymph nodes, noting limited sensitivity for the detection of hilar adenopathy on this noncontrast study.  Lungs/Pleura: No pneumothorax. No pleural effusion. Status post left upper lobectomy. Mild centrilobular emphysema with diffuse bronchial wall thickening. There are at least 20 solid pulmonary nodules scattered throughout both lungs, several which have demonstrated mild interval growth since 09/09/2016 chest CT. For example a 1.0 cm irregular solid right upper lobe  pulmonary nodule (series 4/ image 41), previously 0.8 cm on 09/09/2016 and 0.6 cm on 02/19/2016. Basilar left lower lobe 0.8 cm and solid nodule (series 4/ image 91), previously 0.7 cm  on 09/09/2016 and 0.6 cm on 02/19/2016. Basilar right lower lobe 1.0 cm solid nodule (series 4/image 122), previously 0.9 cm on 09/09/2016. No acute consolidative airspace disease or lung masses.  Upper abdomen: Stable granulomatous calcification in the right liver lobe. Exophytic simple 1.1 cm lateral upper left renal cyst. Partially visualized postsurgical changes from right nephrectomy. Cholecystectomy.  Musculoskeletal: No aggressive appearing focal osseous lesions. Moderate thoracic spondylosis.  IMPRESSION: 1. Numerous solid pulmonary nodules scattered throughout both lungs, several of which demonstrate continued mild growth since 09/09/2016 chest CT, worrisome for progressive pulmonary metastases. 2. Left main and 3 vessel coronary atherosclerosis.  Aortic Atherosclerosis (ICD10-I70.0) and Emphysema (ICD10-J43.9).   Electronically Signed   By: Ilona Sorrel M.D.   On: 12/14/2016 12:56     ASSESSMENT & PLAN:   83 y.o. Caucasian gentleman with   #1 history of left upper lobe bronchoalveolar carcinoma stage I  - T1 and N0 M0. Diagnosed in April 2003 status post left upper lobectomy by Dr. Arlyce Dice.  Patient has no overt clinical evidence of lung cancer recurrence at this time. #2 multiple lung nodules. Slowly increasing in size, still too small to biopsy #3 history of right sided renal cell carcinoma status post right nephrectomy in April 2001. Has not had any evidence of recurrent renal cell carcinoma. #4 end-stage renal disease on hemodialysis at the [facility].  Plan I have reviewed his CT chest scan in detail with him and his niece today. Results noted above. Patient has multiple, at least 20 bilateral pulmonary nodules now, which is concerning for metastatic disease. However  none of them are large enough to be biopsied safely. Patient is clinically not symptomatic from his pulmonary lesions. I have recommended that we get restaging PET/CT on his next visit to see if there is one that's more hypermetabolic active and large enough that we can safely biopsy, patient is agreeable to the plan.   He also stated that he wanted to get a second opinion from his previous oncologist Dr. Anselm Lis, who is currently working in [place] regarding the plan of care and I told him that that is fine.   RTC in 3 months with restaging PET-CT.  Orders Placed This Encounter  Procedures  . NM PET Image Restag (PS) Skull Base To Thigh    Standing Status:   Future    Standing Expiration Date:   12/16/2017    Order Specific Question:   If indicated for the ordered procedure, I authorize the administration of a radiopharmaceutical per Radiology protocol    Answer:   Yes    Order Specific Question:   Preferred imaging location?    Answer:   [facility]    Order Specific Question:   Radiology Contrast Protocol - do NOT remove file path    Answer:   [email or web address]    Order Specific Question:   Reason for Exam additional comments    Answer:   patient with multiple lung nodules that are slowly growing, concerning for metastatic disease, no primary found      All of the patients questions were answered with apparent satisfaction. The patient knows to call the clinic with any problems, questions or concerns.  This note is electronically signed.   Twana First, MD  12/16/2016 2:36 PM

## 2016-12-17 DIAGNOSIS — N2581 Secondary hyperparathyroidism of renal origin: Secondary | ICD-10-CM | POA: Diagnosis not present

## 2016-12-17 DIAGNOSIS — D631 Anemia in chronic kidney disease: Secondary | ICD-10-CM | POA: Diagnosis not present

## 2016-12-17 DIAGNOSIS — N186 End stage renal disease: Secondary | ICD-10-CM | POA: Diagnosis not present

## 2016-12-17 DIAGNOSIS — Z992 Dependence on renal dialysis: Secondary | ICD-10-CM | POA: Diagnosis not present

## 2016-12-17 DIAGNOSIS — D509 Iron deficiency anemia, unspecified: Secondary | ICD-10-CM | POA: Diagnosis not present

## 2016-12-20 ENCOUNTER — Encounter (HOSPITAL_COMMUNITY): Payer: Self-pay | Admitting: Oncology

## 2016-12-20 DIAGNOSIS — D631 Anemia in chronic kidney disease: Secondary | ICD-10-CM | POA: Diagnosis not present

## 2016-12-20 DIAGNOSIS — N186 End stage renal disease: Secondary | ICD-10-CM | POA: Diagnosis not present

## 2016-12-20 DIAGNOSIS — N2581 Secondary hyperparathyroidism of renal origin: Secondary | ICD-10-CM | POA: Diagnosis not present

## 2016-12-20 DIAGNOSIS — Z992 Dependence on renal dialysis: Secondary | ICD-10-CM | POA: Diagnosis not present

## 2016-12-20 DIAGNOSIS — D509 Iron deficiency anemia, unspecified: Secondary | ICD-10-CM | POA: Diagnosis not present

## 2016-12-20 NOTE — Progress Notes (Unsigned)
Faxed patient records to Dr Tressie Stalker for second opinion at patient's request.  [phone]

## 2016-12-22 DIAGNOSIS — D631 Anemia in chronic kidney disease: Secondary | ICD-10-CM | POA: Diagnosis not present

## 2016-12-22 DIAGNOSIS — D509 Iron deficiency anemia, unspecified: Secondary | ICD-10-CM | POA: Diagnosis not present

## 2016-12-22 DIAGNOSIS — N2581 Secondary hyperparathyroidism of renal origin: Secondary | ICD-10-CM | POA: Diagnosis not present

## 2016-12-22 DIAGNOSIS — N186 End stage renal disease: Secondary | ICD-10-CM | POA: Diagnosis not present

## 2016-12-22 DIAGNOSIS — Z992 Dependence on renal dialysis: Secondary | ICD-10-CM | POA: Diagnosis not present

## 2016-12-24 DIAGNOSIS — D509 Iron deficiency anemia, unspecified: Secondary | ICD-10-CM | POA: Diagnosis not present

## 2016-12-24 DIAGNOSIS — N2581 Secondary hyperparathyroidism of renal origin: Secondary | ICD-10-CM | POA: Diagnosis not present

## 2016-12-24 DIAGNOSIS — D631 Anemia in chronic kidney disease: Secondary | ICD-10-CM | POA: Diagnosis not present

## 2016-12-24 DIAGNOSIS — Z992 Dependence on renal dialysis: Secondary | ICD-10-CM | POA: Diagnosis not present

## 2016-12-24 DIAGNOSIS — N186 End stage renal disease: Secondary | ICD-10-CM | POA: Diagnosis not present

## 2016-12-27 DIAGNOSIS — N2581 Secondary hyperparathyroidism of renal origin: Secondary | ICD-10-CM | POA: Diagnosis not present

## 2016-12-27 DIAGNOSIS — D631 Anemia in chronic kidney disease: Secondary | ICD-10-CM | POA: Diagnosis not present

## 2016-12-27 DIAGNOSIS — N186 End stage renal disease: Secondary | ICD-10-CM | POA: Diagnosis not present

## 2016-12-27 DIAGNOSIS — Z992 Dependence on renal dialysis: Secondary | ICD-10-CM | POA: Diagnosis not present

## 2016-12-27 DIAGNOSIS — D509 Iron deficiency anemia, unspecified: Secondary | ICD-10-CM | POA: Diagnosis not present

## 2016-12-28 DIAGNOSIS — Z992 Dependence on renal dialysis: Secondary | ICD-10-CM | POA: Diagnosis not present

## 2016-12-28 DIAGNOSIS — Z85118 Personal history of other malignant neoplasm of bronchus and lung: Secondary | ICD-10-CM | POA: Diagnosis not present

## 2016-12-28 DIAGNOSIS — C3412 Malignant neoplasm of upper lobe, left bronchus or lung: Secondary | ICD-10-CM | POA: Diagnosis not present

## 2016-12-28 DIAGNOSIS — Z87891 Personal history of nicotine dependence: Secondary | ICD-10-CM | POA: Diagnosis not present

## 2016-12-28 DIAGNOSIS — R918 Other nonspecific abnormal finding of lung field: Secondary | ICD-10-CM | POA: Diagnosis not present

## 2016-12-28 DIAGNOSIS — Z85528 Personal history of other malignant neoplasm of kidney: Secondary | ICD-10-CM | POA: Diagnosis not present

## 2016-12-28 DIAGNOSIS — Z905 Acquired absence of kidney: Secondary | ICD-10-CM | POA: Diagnosis not present

## 2016-12-28 DIAGNOSIS — N189 Chronic kidney disease, unspecified: Secondary | ICD-10-CM | POA: Diagnosis not present

## 2016-12-28 DIAGNOSIS — Z902 Acquired absence of lung [part of]: Secondary | ICD-10-CM | POA: Diagnosis not present

## 2016-12-29 DIAGNOSIS — N2581 Secondary hyperparathyroidism of renal origin: Secondary | ICD-10-CM | POA: Diagnosis not present

## 2016-12-29 DIAGNOSIS — N186 End stage renal disease: Secondary | ICD-10-CM | POA: Diagnosis not present

## 2016-12-29 DIAGNOSIS — D509 Iron deficiency anemia, unspecified: Secondary | ICD-10-CM | POA: Diagnosis not present

## 2016-12-29 DIAGNOSIS — D631 Anemia in chronic kidney disease: Secondary | ICD-10-CM | POA: Diagnosis not present

## 2016-12-29 DIAGNOSIS — Z992 Dependence on renal dialysis: Secondary | ICD-10-CM | POA: Diagnosis not present

## 2016-12-30 DIAGNOSIS — N2581 Secondary hyperparathyroidism of renal origin: Secondary | ICD-10-CM | POA: Diagnosis not present

## 2016-12-30 DIAGNOSIS — D509 Iron deficiency anemia, unspecified: Secondary | ICD-10-CM | POA: Diagnosis not present

## 2016-12-30 DIAGNOSIS — Z992 Dependence on renal dialysis: Secondary | ICD-10-CM | POA: Diagnosis not present

## 2016-12-30 DIAGNOSIS — D631 Anemia in chronic kidney disease: Secondary | ICD-10-CM | POA: Diagnosis not present

## 2016-12-30 DIAGNOSIS — N186 End stage renal disease: Secondary | ICD-10-CM | POA: Diagnosis not present

## 2016-12-31 DIAGNOSIS — N186 End stage renal disease: Secondary | ICD-10-CM | POA: Diagnosis not present

## 2016-12-31 DIAGNOSIS — N2581 Secondary hyperparathyroidism of renal origin: Secondary | ICD-10-CM | POA: Diagnosis not present

## 2016-12-31 DIAGNOSIS — Z992 Dependence on renal dialysis: Secondary | ICD-10-CM | POA: Diagnosis not present

## 2016-12-31 DIAGNOSIS — D509 Iron deficiency anemia, unspecified: Secondary | ICD-10-CM | POA: Diagnosis not present

## 2016-12-31 DIAGNOSIS — D631 Anemia in chronic kidney disease: Secondary | ICD-10-CM | POA: Diagnosis not present

## 2017-01-03 DIAGNOSIS — N2581 Secondary hyperparathyroidism of renal origin: Secondary | ICD-10-CM | POA: Diagnosis not present

## 2017-01-03 DIAGNOSIS — N186 End stage renal disease: Secondary | ICD-10-CM | POA: Diagnosis not present

## 2017-01-03 DIAGNOSIS — Z992 Dependence on renal dialysis: Secondary | ICD-10-CM | POA: Diagnosis not present

## 2017-01-03 DIAGNOSIS — D509 Iron deficiency anemia, unspecified: Secondary | ICD-10-CM | POA: Diagnosis not present

## 2017-01-03 DIAGNOSIS — D631 Anemia in chronic kidney disease: Secondary | ICD-10-CM | POA: Diagnosis not present

## 2017-01-05 DIAGNOSIS — N2581 Secondary hyperparathyroidism of renal origin: Secondary | ICD-10-CM | POA: Diagnosis not present

## 2017-01-05 DIAGNOSIS — D509 Iron deficiency anemia, unspecified: Secondary | ICD-10-CM | POA: Diagnosis not present

## 2017-01-05 DIAGNOSIS — N186 End stage renal disease: Secondary | ICD-10-CM | POA: Diagnosis not present

## 2017-01-05 DIAGNOSIS — D631 Anemia in chronic kidney disease: Secondary | ICD-10-CM | POA: Diagnosis not present

## 2017-01-05 DIAGNOSIS — Z992 Dependence on renal dialysis: Secondary | ICD-10-CM | POA: Diagnosis not present

## 2017-01-07 DIAGNOSIS — N2581 Secondary hyperparathyroidism of renal origin: Secondary | ICD-10-CM | POA: Diagnosis not present

## 2017-01-07 DIAGNOSIS — N186 End stage renal disease: Secondary | ICD-10-CM | POA: Diagnosis not present

## 2017-01-07 DIAGNOSIS — D631 Anemia in chronic kidney disease: Secondary | ICD-10-CM | POA: Diagnosis not present

## 2017-01-07 DIAGNOSIS — Z992 Dependence on renal dialysis: Secondary | ICD-10-CM | POA: Diagnosis not present

## 2017-01-07 DIAGNOSIS — D509 Iron deficiency anemia, unspecified: Secondary | ICD-10-CM | POA: Diagnosis not present

## 2017-01-08 DIAGNOSIS — D509 Iron deficiency anemia, unspecified: Secondary | ICD-10-CM | POA: Diagnosis not present

## 2017-01-08 DIAGNOSIS — Z992 Dependence on renal dialysis: Secondary | ICD-10-CM | POA: Diagnosis not present

## 2017-01-08 DIAGNOSIS — N186 End stage renal disease: Secondary | ICD-10-CM | POA: Diagnosis not present

## 2017-01-08 DIAGNOSIS — N2581 Secondary hyperparathyroidism of renal origin: Secondary | ICD-10-CM | POA: Diagnosis not present

## 2017-01-08 DIAGNOSIS — D631 Anemia in chronic kidney disease: Secondary | ICD-10-CM | POA: Diagnosis not present

## 2017-01-10 DIAGNOSIS — N186 End stage renal disease: Secondary | ICD-10-CM | POA: Diagnosis not present

## 2017-01-10 DIAGNOSIS — N2581 Secondary hyperparathyroidism of renal origin: Secondary | ICD-10-CM | POA: Diagnosis not present

## 2017-01-10 DIAGNOSIS — D631 Anemia in chronic kidney disease: Secondary | ICD-10-CM | POA: Diagnosis not present

## 2017-01-10 DIAGNOSIS — D509 Iron deficiency anemia, unspecified: Secondary | ICD-10-CM | POA: Diagnosis not present

## 2017-01-10 DIAGNOSIS — Z992 Dependence on renal dialysis: Secondary | ICD-10-CM | POA: Diagnosis not present

## 2017-01-12 DIAGNOSIS — D631 Anemia in chronic kidney disease: Secondary | ICD-10-CM | POA: Diagnosis not present

## 2017-01-12 DIAGNOSIS — Z992 Dependence on renal dialysis: Secondary | ICD-10-CM | POA: Diagnosis not present

## 2017-01-12 DIAGNOSIS — D509 Iron deficiency anemia, unspecified: Secondary | ICD-10-CM | POA: Diagnosis not present

## 2017-01-12 DIAGNOSIS — N186 End stage renal disease: Secondary | ICD-10-CM | POA: Diagnosis not present

## 2017-01-12 DIAGNOSIS — N2581 Secondary hyperparathyroidism of renal origin: Secondary | ICD-10-CM | POA: Diagnosis not present

## 2017-01-13 DIAGNOSIS — Z85828 Personal history of other malignant neoplasm of skin: Secondary | ICD-10-CM | POA: Diagnosis not present

## 2017-01-13 DIAGNOSIS — L218 Other seborrheic dermatitis: Secondary | ICD-10-CM | POA: Diagnosis not present

## 2017-01-13 DIAGNOSIS — X32XXXD Exposure to sunlight, subsequent encounter: Secondary | ICD-10-CM | POA: Diagnosis not present

## 2017-01-13 DIAGNOSIS — D225 Melanocytic nevi of trunk: Secondary | ICD-10-CM | POA: Diagnosis not present

## 2017-01-13 DIAGNOSIS — Z08 Encounter for follow-up examination after completed treatment for malignant neoplasm: Secondary | ICD-10-CM | POA: Diagnosis not present

## 2017-01-14 DIAGNOSIS — Z992 Dependence on renal dialysis: Secondary | ICD-10-CM | POA: Diagnosis not present

## 2017-01-14 DIAGNOSIS — D509 Iron deficiency anemia, unspecified: Secondary | ICD-10-CM | POA: Diagnosis not present

## 2017-01-14 DIAGNOSIS — D631 Anemia in chronic kidney disease: Secondary | ICD-10-CM | POA: Diagnosis not present

## 2017-01-14 DIAGNOSIS — N186 End stage renal disease: Secondary | ICD-10-CM | POA: Diagnosis not present

## 2017-01-14 DIAGNOSIS — N2581 Secondary hyperparathyroidism of renal origin: Secondary | ICD-10-CM | POA: Diagnosis not present

## 2017-01-17 DIAGNOSIS — N2581 Secondary hyperparathyroidism of renal origin: Secondary | ICD-10-CM | POA: Diagnosis not present

## 2017-01-17 DIAGNOSIS — Z992 Dependence on renal dialysis: Secondary | ICD-10-CM | POA: Diagnosis not present

## 2017-01-17 DIAGNOSIS — D631 Anemia in chronic kidney disease: Secondary | ICD-10-CM | POA: Diagnosis not present

## 2017-01-17 DIAGNOSIS — D509 Iron deficiency anemia, unspecified: Secondary | ICD-10-CM | POA: Diagnosis not present

## 2017-01-17 DIAGNOSIS — N186 End stage renal disease: Secondary | ICD-10-CM | POA: Diagnosis not present

## 2017-01-19 DIAGNOSIS — D631 Anemia in chronic kidney disease: Secondary | ICD-10-CM | POA: Diagnosis not present

## 2017-01-19 DIAGNOSIS — N186 End stage renal disease: Secondary | ICD-10-CM | POA: Diagnosis not present

## 2017-01-19 DIAGNOSIS — N2581 Secondary hyperparathyroidism of renal origin: Secondary | ICD-10-CM | POA: Diagnosis not present

## 2017-01-19 DIAGNOSIS — D509 Iron deficiency anemia, unspecified: Secondary | ICD-10-CM | POA: Diagnosis not present

## 2017-01-19 DIAGNOSIS — Z992 Dependence on renal dialysis: Secondary | ICD-10-CM | POA: Diagnosis not present

## 2017-01-21 DIAGNOSIS — N2581 Secondary hyperparathyroidism of renal origin: Secondary | ICD-10-CM | POA: Diagnosis not present

## 2017-01-21 DIAGNOSIS — D509 Iron deficiency anemia, unspecified: Secondary | ICD-10-CM | POA: Diagnosis not present

## 2017-01-21 DIAGNOSIS — Z992 Dependence on renal dialysis: Secondary | ICD-10-CM | POA: Diagnosis not present

## 2017-01-21 DIAGNOSIS — D631 Anemia in chronic kidney disease: Secondary | ICD-10-CM | POA: Diagnosis not present

## 2017-01-21 DIAGNOSIS — N186 End stage renal disease: Secondary | ICD-10-CM | POA: Diagnosis not present

## 2017-01-24 DIAGNOSIS — D631 Anemia in chronic kidney disease: Secondary | ICD-10-CM | POA: Diagnosis not present

## 2017-01-24 DIAGNOSIS — N2581 Secondary hyperparathyroidism of renal origin: Secondary | ICD-10-CM | POA: Diagnosis not present

## 2017-01-24 DIAGNOSIS — Z992 Dependence on renal dialysis: Secondary | ICD-10-CM | POA: Diagnosis not present

## 2017-01-24 DIAGNOSIS — N186 End stage renal disease: Secondary | ICD-10-CM | POA: Diagnosis not present

## 2017-01-24 DIAGNOSIS — D509 Iron deficiency anemia, unspecified: Secondary | ICD-10-CM | POA: Diagnosis not present

## 2017-01-26 DIAGNOSIS — D631 Anemia in chronic kidney disease: Secondary | ICD-10-CM | POA: Diagnosis not present

## 2017-01-26 DIAGNOSIS — Z992 Dependence on renal dialysis: Secondary | ICD-10-CM | POA: Diagnosis not present

## 2017-01-26 DIAGNOSIS — N2581 Secondary hyperparathyroidism of renal origin: Secondary | ICD-10-CM | POA: Diagnosis not present

## 2017-01-26 DIAGNOSIS — N186 End stage renal disease: Secondary | ICD-10-CM | POA: Diagnosis not present

## 2017-01-26 DIAGNOSIS — D509 Iron deficiency anemia, unspecified: Secondary | ICD-10-CM | POA: Diagnosis not present

## 2017-01-28 DIAGNOSIS — Z992 Dependence on renal dialysis: Secondary | ICD-10-CM | POA: Diagnosis not present

## 2017-01-28 DIAGNOSIS — D509 Iron deficiency anemia, unspecified: Secondary | ICD-10-CM | POA: Diagnosis not present

## 2017-01-28 DIAGNOSIS — D631 Anemia in chronic kidney disease: Secondary | ICD-10-CM | POA: Diagnosis not present

## 2017-01-28 DIAGNOSIS — N186 End stage renal disease: Secondary | ICD-10-CM | POA: Diagnosis not present

## 2017-01-28 DIAGNOSIS — N2581 Secondary hyperparathyroidism of renal origin: Secondary | ICD-10-CM | POA: Diagnosis not present

## 2017-01-29 DIAGNOSIS — N186 End stage renal disease: Secondary | ICD-10-CM | POA: Diagnosis not present

## 2017-01-29 DIAGNOSIS — Z992 Dependence on renal dialysis: Secondary | ICD-10-CM | POA: Diagnosis not present

## 2017-01-29 DIAGNOSIS — D631 Anemia in chronic kidney disease: Secondary | ICD-10-CM | POA: Diagnosis not present

## 2017-01-29 DIAGNOSIS — N2581 Secondary hyperparathyroidism of renal origin: Secondary | ICD-10-CM | POA: Diagnosis not present

## 2017-01-29 DIAGNOSIS — D509 Iron deficiency anemia, unspecified: Secondary | ICD-10-CM | POA: Diagnosis not present

## 2017-01-31 DIAGNOSIS — N2581 Secondary hyperparathyroidism of renal origin: Secondary | ICD-10-CM | POA: Diagnosis not present

## 2017-01-31 DIAGNOSIS — D509 Iron deficiency anemia, unspecified: Secondary | ICD-10-CM | POA: Diagnosis not present

## 2017-01-31 DIAGNOSIS — D631 Anemia in chronic kidney disease: Secondary | ICD-10-CM | POA: Diagnosis not present

## 2017-01-31 DIAGNOSIS — N186 End stage renal disease: Secondary | ICD-10-CM | POA: Diagnosis not present

## 2017-01-31 DIAGNOSIS — Z992 Dependence on renal dialysis: Secondary | ICD-10-CM | POA: Diagnosis not present

## 2017-02-02 DIAGNOSIS — N186 End stage renal disease: Secondary | ICD-10-CM | POA: Diagnosis not present

## 2017-02-02 DIAGNOSIS — D631 Anemia in chronic kidney disease: Secondary | ICD-10-CM | POA: Diagnosis not present

## 2017-02-02 DIAGNOSIS — N2581 Secondary hyperparathyroidism of renal origin: Secondary | ICD-10-CM | POA: Diagnosis not present

## 2017-02-02 DIAGNOSIS — D509 Iron deficiency anemia, unspecified: Secondary | ICD-10-CM | POA: Diagnosis not present

## 2017-02-02 DIAGNOSIS — Z992 Dependence on renal dialysis: Secondary | ICD-10-CM | POA: Diagnosis not present

## 2017-02-03 DIAGNOSIS — R918 Other nonspecific abnormal finding of lung field: Secondary | ICD-10-CM | POA: Diagnosis not present

## 2017-02-03 DIAGNOSIS — K5790 Diverticulosis of intestine, part unspecified, without perforation or abscess without bleeding: Secondary | ICD-10-CM | POA: Diagnosis not present

## 2017-02-03 DIAGNOSIS — C3412 Malignant neoplasm of upper lobe, left bronchus or lung: Secondary | ICD-10-CM | POA: Diagnosis not present

## 2017-02-04 DIAGNOSIS — N2581 Secondary hyperparathyroidism of renal origin: Secondary | ICD-10-CM | POA: Diagnosis not present

## 2017-02-04 DIAGNOSIS — D509 Iron deficiency anemia, unspecified: Secondary | ICD-10-CM | POA: Diagnosis not present

## 2017-02-04 DIAGNOSIS — D631 Anemia in chronic kidney disease: Secondary | ICD-10-CM | POA: Diagnosis not present

## 2017-02-04 DIAGNOSIS — Z992 Dependence on renal dialysis: Secondary | ICD-10-CM | POA: Diagnosis not present

## 2017-02-04 DIAGNOSIS — N186 End stage renal disease: Secondary | ICD-10-CM | POA: Diagnosis not present

## 2017-02-06 DIAGNOSIS — N186 End stage renal disease: Secondary | ICD-10-CM | POA: Diagnosis not present

## 2017-02-06 DIAGNOSIS — Z992 Dependence on renal dialysis: Secondary | ICD-10-CM | POA: Diagnosis not present

## 2017-02-07 DIAGNOSIS — D631 Anemia in chronic kidney disease: Secondary | ICD-10-CM | POA: Diagnosis not present

## 2017-02-07 DIAGNOSIS — N2581 Secondary hyperparathyroidism of renal origin: Secondary | ICD-10-CM | POA: Diagnosis not present

## 2017-02-07 DIAGNOSIS — N186 End stage renal disease: Secondary | ICD-10-CM | POA: Diagnosis not present

## 2017-02-07 DIAGNOSIS — Z992 Dependence on renal dialysis: Secondary | ICD-10-CM | POA: Diagnosis not present

## 2017-02-07 DIAGNOSIS — D509 Iron deficiency anemia, unspecified: Secondary | ICD-10-CM | POA: Diagnosis not present

## 2017-02-08 ENCOUNTER — Ambulatory Visit (HOSPITAL_COMMUNITY): Payer: Medicare Other

## 2017-02-09 ENCOUNTER — Ambulatory Visit (HOSPITAL_COMMUNITY): Payer: Self-pay | Admitting: Adult Health

## 2017-02-09 DIAGNOSIS — D631 Anemia in chronic kidney disease: Secondary | ICD-10-CM | POA: Diagnosis not present

## 2017-02-09 DIAGNOSIS — Z992 Dependence on renal dialysis: Secondary | ICD-10-CM | POA: Diagnosis not present

## 2017-02-09 DIAGNOSIS — N2581 Secondary hyperparathyroidism of renal origin: Secondary | ICD-10-CM | POA: Diagnosis not present

## 2017-02-09 DIAGNOSIS — N186 End stage renal disease: Secondary | ICD-10-CM | POA: Diagnosis not present

## 2017-02-09 DIAGNOSIS — D509 Iron deficiency anemia, unspecified: Secondary | ICD-10-CM | POA: Diagnosis not present

## 2017-02-10 ENCOUNTER — Ambulatory Visit (HOSPITAL_COMMUNITY): Payer: Self-pay | Admitting: Adult Health

## 2017-02-11 DIAGNOSIS — D631 Anemia in chronic kidney disease: Secondary | ICD-10-CM | POA: Diagnosis not present

## 2017-02-11 DIAGNOSIS — N2581 Secondary hyperparathyroidism of renal origin: Secondary | ICD-10-CM | POA: Diagnosis not present

## 2017-02-11 DIAGNOSIS — D509 Iron deficiency anemia, unspecified: Secondary | ICD-10-CM | POA: Diagnosis not present

## 2017-02-11 DIAGNOSIS — Z992 Dependence on renal dialysis: Secondary | ICD-10-CM | POA: Diagnosis not present

## 2017-02-11 DIAGNOSIS — N186 End stage renal disease: Secondary | ICD-10-CM | POA: Diagnosis not present

## 2017-02-14 DIAGNOSIS — D631 Anemia in chronic kidney disease: Secondary | ICD-10-CM | POA: Diagnosis not present

## 2017-02-14 DIAGNOSIS — N186 End stage renal disease: Secondary | ICD-10-CM | POA: Diagnosis not present

## 2017-02-14 DIAGNOSIS — N2581 Secondary hyperparathyroidism of renal origin: Secondary | ICD-10-CM | POA: Diagnosis not present

## 2017-02-14 DIAGNOSIS — D509 Iron deficiency anemia, unspecified: Secondary | ICD-10-CM | POA: Diagnosis not present

## 2017-02-14 DIAGNOSIS — Z992 Dependence on renal dialysis: Secondary | ICD-10-CM | POA: Diagnosis not present

## 2017-02-16 DIAGNOSIS — Z992 Dependence on renal dialysis: Secondary | ICD-10-CM | POA: Diagnosis not present

## 2017-02-16 DIAGNOSIS — N2581 Secondary hyperparathyroidism of renal origin: Secondary | ICD-10-CM | POA: Diagnosis not present

## 2017-02-16 DIAGNOSIS — D509 Iron deficiency anemia, unspecified: Secondary | ICD-10-CM | POA: Diagnosis not present

## 2017-02-16 DIAGNOSIS — D631 Anemia in chronic kidney disease: Secondary | ICD-10-CM | POA: Diagnosis not present

## 2017-02-16 DIAGNOSIS — N186 End stage renal disease: Secondary | ICD-10-CM | POA: Diagnosis not present

## 2017-02-21 DIAGNOSIS — Z992 Dependence on renal dialysis: Secondary | ICD-10-CM | POA: Diagnosis not present

## 2017-02-21 DIAGNOSIS — D631 Anemia in chronic kidney disease: Secondary | ICD-10-CM | POA: Diagnosis not present

## 2017-02-21 DIAGNOSIS — N186 End stage renal disease: Secondary | ICD-10-CM | POA: Diagnosis not present

## 2017-02-21 DIAGNOSIS — N2581 Secondary hyperparathyroidism of renal origin: Secondary | ICD-10-CM | POA: Diagnosis not present

## 2017-02-21 DIAGNOSIS — D509 Iron deficiency anemia, unspecified: Secondary | ICD-10-CM | POA: Diagnosis not present

## 2017-02-23 DIAGNOSIS — N186 End stage renal disease: Secondary | ICD-10-CM | POA: Diagnosis not present

## 2017-02-23 DIAGNOSIS — N2581 Secondary hyperparathyroidism of renal origin: Secondary | ICD-10-CM | POA: Diagnosis not present

## 2017-02-23 DIAGNOSIS — D509 Iron deficiency anemia, unspecified: Secondary | ICD-10-CM | POA: Diagnosis not present

## 2017-02-23 DIAGNOSIS — D631 Anemia in chronic kidney disease: Secondary | ICD-10-CM | POA: Diagnosis not present

## 2017-02-23 DIAGNOSIS — Z992 Dependence on renal dialysis: Secondary | ICD-10-CM | POA: Diagnosis not present

## 2017-02-25 DIAGNOSIS — N2581 Secondary hyperparathyroidism of renal origin: Secondary | ICD-10-CM | POA: Diagnosis not present

## 2017-02-25 DIAGNOSIS — Z992 Dependence on renal dialysis: Secondary | ICD-10-CM | POA: Diagnosis not present

## 2017-02-25 DIAGNOSIS — D509 Iron deficiency anemia, unspecified: Secondary | ICD-10-CM | POA: Diagnosis not present

## 2017-02-25 DIAGNOSIS — N186 End stage renal disease: Secondary | ICD-10-CM | POA: Diagnosis not present

## 2017-02-25 DIAGNOSIS — D631 Anemia in chronic kidney disease: Secondary | ICD-10-CM | POA: Diagnosis not present

## 2017-02-28 DIAGNOSIS — D631 Anemia in chronic kidney disease: Secondary | ICD-10-CM | POA: Diagnosis not present

## 2017-02-28 DIAGNOSIS — N186 End stage renal disease: Secondary | ICD-10-CM | POA: Diagnosis not present

## 2017-02-28 DIAGNOSIS — Z992 Dependence on renal dialysis: Secondary | ICD-10-CM | POA: Diagnosis not present

## 2017-02-28 DIAGNOSIS — D509 Iron deficiency anemia, unspecified: Secondary | ICD-10-CM | POA: Diagnosis not present

## 2017-02-28 DIAGNOSIS — N2581 Secondary hyperparathyroidism of renal origin: Secondary | ICD-10-CM | POA: Diagnosis not present

## 2017-03-02 DIAGNOSIS — N186 End stage renal disease: Secondary | ICD-10-CM | POA: Diagnosis not present

## 2017-03-02 DIAGNOSIS — Z6825 Body mass index (BMI) 25.0-25.9, adult: Secondary | ICD-10-CM | POA: Diagnosis not present

## 2017-03-02 DIAGNOSIS — D631 Anemia in chronic kidney disease: Secondary | ICD-10-CM | POA: Diagnosis not present

## 2017-03-02 DIAGNOSIS — M7711 Lateral epicondylitis, right elbow: Secondary | ICD-10-CM | POA: Diagnosis not present

## 2017-03-02 DIAGNOSIS — E663 Overweight: Secondary | ICD-10-CM | POA: Diagnosis not present

## 2017-03-02 DIAGNOSIS — D509 Iron deficiency anemia, unspecified: Secondary | ICD-10-CM | POA: Diagnosis not present

## 2017-03-02 DIAGNOSIS — N2581 Secondary hyperparathyroidism of renal origin: Secondary | ICD-10-CM | POA: Diagnosis not present

## 2017-03-02 DIAGNOSIS — Z992 Dependence on renal dialysis: Secondary | ICD-10-CM | POA: Diagnosis not present

## 2017-03-04 DIAGNOSIS — Z992 Dependence on renal dialysis: Secondary | ICD-10-CM | POA: Diagnosis not present

## 2017-03-04 DIAGNOSIS — N186 End stage renal disease: Secondary | ICD-10-CM | POA: Diagnosis not present

## 2017-03-04 DIAGNOSIS — D631 Anemia in chronic kidney disease: Secondary | ICD-10-CM | POA: Diagnosis not present

## 2017-03-04 DIAGNOSIS — D509 Iron deficiency anemia, unspecified: Secondary | ICD-10-CM | POA: Diagnosis not present

## 2017-03-04 DIAGNOSIS — N2581 Secondary hyperparathyroidism of renal origin: Secondary | ICD-10-CM | POA: Diagnosis not present

## 2017-03-07 DIAGNOSIS — N186 End stage renal disease: Secondary | ICD-10-CM | POA: Diagnosis not present

## 2017-03-07 DIAGNOSIS — N2581 Secondary hyperparathyroidism of renal origin: Secondary | ICD-10-CM | POA: Diagnosis not present

## 2017-03-07 DIAGNOSIS — D509 Iron deficiency anemia, unspecified: Secondary | ICD-10-CM | POA: Diagnosis not present

## 2017-03-07 DIAGNOSIS — D631 Anemia in chronic kidney disease: Secondary | ICD-10-CM | POA: Diagnosis not present

## 2017-03-07 DIAGNOSIS — Z992 Dependence on renal dialysis: Secondary | ICD-10-CM | POA: Diagnosis not present

## 2017-03-08 DIAGNOSIS — Z992 Dependence on renal dialysis: Secondary | ICD-10-CM | POA: Diagnosis not present

## 2017-03-08 DIAGNOSIS — N186 End stage renal disease: Secondary | ICD-10-CM | POA: Diagnosis not present

## 2017-03-09 DIAGNOSIS — D631 Anemia in chronic kidney disease: Secondary | ICD-10-CM | POA: Diagnosis not present

## 2017-03-09 DIAGNOSIS — Z992 Dependence on renal dialysis: Secondary | ICD-10-CM | POA: Diagnosis not present

## 2017-03-09 DIAGNOSIS — N2581 Secondary hyperparathyroidism of renal origin: Secondary | ICD-10-CM | POA: Diagnosis not present

## 2017-03-09 DIAGNOSIS — D509 Iron deficiency anemia, unspecified: Secondary | ICD-10-CM | POA: Diagnosis not present

## 2017-03-09 DIAGNOSIS — N186 End stage renal disease: Secondary | ICD-10-CM | POA: Diagnosis not present

## 2017-03-10 DIAGNOSIS — Z992 Dependence on renal dialysis: Secondary | ICD-10-CM | POA: Diagnosis not present

## 2017-03-10 DIAGNOSIS — N186 End stage renal disease: Secondary | ICD-10-CM | POA: Diagnosis not present

## 2017-03-10 DIAGNOSIS — D631 Anemia in chronic kidney disease: Secondary | ICD-10-CM | POA: Diagnosis not present

## 2017-03-10 DIAGNOSIS — D509 Iron deficiency anemia, unspecified: Secondary | ICD-10-CM | POA: Diagnosis not present

## 2017-03-10 DIAGNOSIS — N2581 Secondary hyperparathyroidism of renal origin: Secondary | ICD-10-CM | POA: Diagnosis not present

## 2017-03-11 DIAGNOSIS — D509 Iron deficiency anemia, unspecified: Secondary | ICD-10-CM | POA: Diagnosis not present

## 2017-03-11 DIAGNOSIS — D631 Anemia in chronic kidney disease: Secondary | ICD-10-CM | POA: Diagnosis not present

## 2017-03-11 DIAGNOSIS — Z992 Dependence on renal dialysis: Secondary | ICD-10-CM | POA: Diagnosis not present

## 2017-03-11 DIAGNOSIS — N2581 Secondary hyperparathyroidism of renal origin: Secondary | ICD-10-CM | POA: Diagnosis not present

## 2017-03-11 DIAGNOSIS — N186 End stage renal disease: Secondary | ICD-10-CM | POA: Diagnosis not present

## 2017-03-14 DIAGNOSIS — N2581 Secondary hyperparathyroidism of renal origin: Secondary | ICD-10-CM | POA: Diagnosis not present

## 2017-03-14 DIAGNOSIS — D631 Anemia in chronic kidney disease: Secondary | ICD-10-CM | POA: Diagnosis not present

## 2017-03-14 DIAGNOSIS — D509 Iron deficiency anemia, unspecified: Secondary | ICD-10-CM | POA: Diagnosis not present

## 2017-03-14 DIAGNOSIS — N186 End stage renal disease: Secondary | ICD-10-CM | POA: Diagnosis not present

## 2017-03-14 DIAGNOSIS — Z992 Dependence on renal dialysis: Secondary | ICD-10-CM | POA: Diagnosis not present

## 2017-03-15 ENCOUNTER — Ambulatory Visit (HOSPITAL_COMMUNITY): Payer: Medicare Other

## 2017-03-16 DIAGNOSIS — N2581 Secondary hyperparathyroidism of renal origin: Secondary | ICD-10-CM | POA: Diagnosis not present

## 2017-03-16 DIAGNOSIS — D509 Iron deficiency anemia, unspecified: Secondary | ICD-10-CM | POA: Diagnosis not present

## 2017-03-16 DIAGNOSIS — D631 Anemia in chronic kidney disease: Secondary | ICD-10-CM | POA: Diagnosis not present

## 2017-03-16 DIAGNOSIS — N186 End stage renal disease: Secondary | ICD-10-CM | POA: Diagnosis not present

## 2017-03-16 DIAGNOSIS — Z992 Dependence on renal dialysis: Secondary | ICD-10-CM | POA: Diagnosis not present

## 2017-03-17 ENCOUNTER — Ambulatory Visit (HOSPITAL_COMMUNITY): Payer: Self-pay

## 2017-03-18 DIAGNOSIS — Z992 Dependence on renal dialysis: Secondary | ICD-10-CM | POA: Diagnosis not present

## 2017-03-18 DIAGNOSIS — N186 End stage renal disease: Secondary | ICD-10-CM | POA: Diagnosis not present

## 2017-03-18 DIAGNOSIS — D509 Iron deficiency anemia, unspecified: Secondary | ICD-10-CM | POA: Diagnosis not present

## 2017-03-18 DIAGNOSIS — D631 Anemia in chronic kidney disease: Secondary | ICD-10-CM | POA: Diagnosis not present

## 2017-03-18 DIAGNOSIS — N2581 Secondary hyperparathyroidism of renal origin: Secondary | ICD-10-CM | POA: Diagnosis not present

## 2017-03-21 DIAGNOSIS — N186 End stage renal disease: Secondary | ICD-10-CM | POA: Diagnosis not present

## 2017-03-21 DIAGNOSIS — Z992 Dependence on renal dialysis: Secondary | ICD-10-CM | POA: Diagnosis not present

## 2017-03-21 DIAGNOSIS — N2581 Secondary hyperparathyroidism of renal origin: Secondary | ICD-10-CM | POA: Diagnosis not present

## 2017-03-21 DIAGNOSIS — D631 Anemia in chronic kidney disease: Secondary | ICD-10-CM | POA: Diagnosis not present

## 2017-03-21 DIAGNOSIS — D509 Iron deficiency anemia, unspecified: Secondary | ICD-10-CM | POA: Diagnosis not present

## 2017-03-22 DIAGNOSIS — Z992 Dependence on renal dialysis: Secondary | ICD-10-CM | POA: Diagnosis not present

## 2017-03-22 DIAGNOSIS — Z85118 Personal history of other malignant neoplasm of bronchus and lung: Secondary | ICD-10-CM | POA: Diagnosis not present

## 2017-03-22 DIAGNOSIS — Z902 Acquired absence of lung [part of]: Secondary | ICD-10-CM | POA: Diagnosis not present

## 2017-03-22 DIAGNOSIS — R918 Other nonspecific abnormal finding of lung field: Secondary | ICD-10-CM | POA: Diagnosis not present

## 2017-03-22 DIAGNOSIS — C641 Malignant neoplasm of right kidney, except renal pelvis: Secondary | ICD-10-CM | POA: Diagnosis not present

## 2017-03-22 DIAGNOSIS — Z85528 Personal history of other malignant neoplasm of kidney: Secondary | ICD-10-CM | POA: Diagnosis not present

## 2017-03-22 DIAGNOSIS — C3412 Malignant neoplasm of upper lobe, left bronchus or lung: Secondary | ICD-10-CM | POA: Diagnosis not present

## 2017-03-22 DIAGNOSIS — D472 Monoclonal gammopathy: Secondary | ICD-10-CM | POA: Diagnosis not present

## 2017-03-22 DIAGNOSIS — Z905 Acquired absence of kidney: Secondary | ICD-10-CM | POA: Diagnosis not present

## 2017-03-22 DIAGNOSIS — N186 End stage renal disease: Secondary | ICD-10-CM | POA: Diagnosis not present

## 2017-03-22 DIAGNOSIS — J449 Chronic obstructive pulmonary disease, unspecified: Secondary | ICD-10-CM | POA: Diagnosis not present

## 2017-03-22 DIAGNOSIS — Z08 Encounter for follow-up examination after completed treatment for malignant neoplasm: Secondary | ICD-10-CM | POA: Diagnosis not present

## 2017-03-23 DIAGNOSIS — N186 End stage renal disease: Secondary | ICD-10-CM | POA: Diagnosis not present

## 2017-03-23 DIAGNOSIS — Z992 Dependence on renal dialysis: Secondary | ICD-10-CM | POA: Diagnosis not present

## 2017-03-23 DIAGNOSIS — D509 Iron deficiency anemia, unspecified: Secondary | ICD-10-CM | POA: Diagnosis not present

## 2017-03-23 DIAGNOSIS — N2581 Secondary hyperparathyroidism of renal origin: Secondary | ICD-10-CM | POA: Diagnosis not present

## 2017-03-23 DIAGNOSIS — D631 Anemia in chronic kidney disease: Secondary | ICD-10-CM | POA: Diagnosis not present

## 2017-03-25 DIAGNOSIS — N2581 Secondary hyperparathyroidism of renal origin: Secondary | ICD-10-CM | POA: Diagnosis not present

## 2017-03-25 DIAGNOSIS — D631 Anemia in chronic kidney disease: Secondary | ICD-10-CM | POA: Diagnosis not present

## 2017-03-25 DIAGNOSIS — D509 Iron deficiency anemia, unspecified: Secondary | ICD-10-CM | POA: Diagnosis not present

## 2017-03-25 DIAGNOSIS — Z992 Dependence on renal dialysis: Secondary | ICD-10-CM | POA: Diagnosis not present

## 2017-03-25 DIAGNOSIS — N186 End stage renal disease: Secondary | ICD-10-CM | POA: Diagnosis not present

## 2017-03-28 DIAGNOSIS — Z992 Dependence on renal dialysis: Secondary | ICD-10-CM | POA: Diagnosis not present

## 2017-03-28 DIAGNOSIS — D509 Iron deficiency anemia, unspecified: Secondary | ICD-10-CM | POA: Diagnosis not present

## 2017-03-28 DIAGNOSIS — D631 Anemia in chronic kidney disease: Secondary | ICD-10-CM | POA: Diagnosis not present

## 2017-03-28 DIAGNOSIS — N2581 Secondary hyperparathyroidism of renal origin: Secondary | ICD-10-CM | POA: Diagnosis not present

## 2017-03-28 DIAGNOSIS — N186 End stage renal disease: Secondary | ICD-10-CM | POA: Diagnosis not present

## 2017-03-30 DIAGNOSIS — D509 Iron deficiency anemia, unspecified: Secondary | ICD-10-CM | POA: Diagnosis not present

## 2017-03-30 DIAGNOSIS — D631 Anemia in chronic kidney disease: Secondary | ICD-10-CM | POA: Diagnosis not present

## 2017-03-30 DIAGNOSIS — N186 End stage renal disease: Secondary | ICD-10-CM | POA: Diagnosis not present

## 2017-03-30 DIAGNOSIS — Z992 Dependence on renal dialysis: Secondary | ICD-10-CM | POA: Diagnosis not present

## 2017-03-30 DIAGNOSIS — N2581 Secondary hyperparathyroidism of renal origin: Secondary | ICD-10-CM | POA: Diagnosis not present

## 2017-04-01 DIAGNOSIS — D509 Iron deficiency anemia, unspecified: Secondary | ICD-10-CM | POA: Diagnosis not present

## 2017-04-01 DIAGNOSIS — N186 End stage renal disease: Secondary | ICD-10-CM | POA: Diagnosis not present

## 2017-04-01 DIAGNOSIS — N2581 Secondary hyperparathyroidism of renal origin: Secondary | ICD-10-CM | POA: Diagnosis not present

## 2017-04-01 DIAGNOSIS — Z992 Dependence on renal dialysis: Secondary | ICD-10-CM | POA: Diagnosis not present

## 2017-04-01 DIAGNOSIS — D631 Anemia in chronic kidney disease: Secondary | ICD-10-CM | POA: Diagnosis not present

## 2017-04-04 DIAGNOSIS — Z992 Dependence on renal dialysis: Secondary | ICD-10-CM | POA: Diagnosis not present

## 2017-04-04 DIAGNOSIS — D631 Anemia in chronic kidney disease: Secondary | ICD-10-CM | POA: Diagnosis not present

## 2017-04-04 DIAGNOSIS — N186 End stage renal disease: Secondary | ICD-10-CM | POA: Diagnosis not present

## 2017-04-04 DIAGNOSIS — N2581 Secondary hyperparathyroidism of renal origin: Secondary | ICD-10-CM | POA: Diagnosis not present

## 2017-04-04 DIAGNOSIS — D509 Iron deficiency anemia, unspecified: Secondary | ICD-10-CM | POA: Diagnosis not present

## 2017-04-06 DIAGNOSIS — N2581 Secondary hyperparathyroidism of renal origin: Secondary | ICD-10-CM | POA: Diagnosis not present

## 2017-04-06 DIAGNOSIS — D631 Anemia in chronic kidney disease: Secondary | ICD-10-CM | POA: Diagnosis not present

## 2017-04-06 DIAGNOSIS — D509 Iron deficiency anemia, unspecified: Secondary | ICD-10-CM | POA: Diagnosis not present

## 2017-04-06 DIAGNOSIS — Z992 Dependence on renal dialysis: Secondary | ICD-10-CM | POA: Diagnosis not present

## 2017-04-06 DIAGNOSIS — N186 End stage renal disease: Secondary | ICD-10-CM | POA: Diagnosis not present

## 2017-04-07 DIAGNOSIS — Z992 Dependence on renal dialysis: Secondary | ICD-10-CM | POA: Diagnosis not present

## 2017-04-07 DIAGNOSIS — N2581 Secondary hyperparathyroidism of renal origin: Secondary | ICD-10-CM | POA: Diagnosis not present

## 2017-04-07 DIAGNOSIS — D631 Anemia in chronic kidney disease: Secondary | ICD-10-CM | POA: Diagnosis not present

## 2017-04-07 DIAGNOSIS — N186 End stage renal disease: Secondary | ICD-10-CM | POA: Diagnosis not present

## 2017-04-07 DIAGNOSIS — D509 Iron deficiency anemia, unspecified: Secondary | ICD-10-CM | POA: Diagnosis not present

## 2017-04-08 DIAGNOSIS — Z992 Dependence on renal dialysis: Secondary | ICD-10-CM | POA: Diagnosis not present

## 2017-04-08 DIAGNOSIS — N2581 Secondary hyperparathyroidism of renal origin: Secondary | ICD-10-CM | POA: Diagnosis not present

## 2017-04-08 DIAGNOSIS — D631 Anemia in chronic kidney disease: Secondary | ICD-10-CM | POA: Diagnosis not present

## 2017-04-08 DIAGNOSIS — N186 End stage renal disease: Secondary | ICD-10-CM | POA: Diagnosis not present

## 2017-04-08 DIAGNOSIS — D509 Iron deficiency anemia, unspecified: Secondary | ICD-10-CM | POA: Diagnosis not present

## 2017-04-09 DIAGNOSIS — Z992 Dependence on renal dialysis: Secondary | ICD-10-CM | POA: Diagnosis not present

## 2017-04-09 DIAGNOSIS — D631 Anemia in chronic kidney disease: Secondary | ICD-10-CM | POA: Diagnosis not present

## 2017-04-09 DIAGNOSIS — N2581 Secondary hyperparathyroidism of renal origin: Secondary | ICD-10-CM | POA: Diagnosis not present

## 2017-04-09 DIAGNOSIS — D509 Iron deficiency anemia, unspecified: Secondary | ICD-10-CM | POA: Diagnosis not present

## 2017-04-09 DIAGNOSIS — N186 End stage renal disease: Secondary | ICD-10-CM | POA: Diagnosis not present

## 2017-04-11 DIAGNOSIS — D509 Iron deficiency anemia, unspecified: Secondary | ICD-10-CM | POA: Diagnosis not present

## 2017-04-11 DIAGNOSIS — N186 End stage renal disease: Secondary | ICD-10-CM | POA: Diagnosis not present

## 2017-04-11 DIAGNOSIS — N2581 Secondary hyperparathyroidism of renal origin: Secondary | ICD-10-CM | POA: Diagnosis not present

## 2017-04-11 DIAGNOSIS — Z992 Dependence on renal dialysis: Secondary | ICD-10-CM | POA: Diagnosis not present

## 2017-04-11 DIAGNOSIS — D631 Anemia in chronic kidney disease: Secondary | ICD-10-CM | POA: Diagnosis not present

## 2017-04-13 DIAGNOSIS — Z992 Dependence on renal dialysis: Secondary | ICD-10-CM | POA: Diagnosis not present

## 2017-04-13 DIAGNOSIS — N186 End stage renal disease: Secondary | ICD-10-CM | POA: Diagnosis not present

## 2017-04-13 DIAGNOSIS — D631 Anemia in chronic kidney disease: Secondary | ICD-10-CM | POA: Diagnosis not present

## 2017-04-13 DIAGNOSIS — D509 Iron deficiency anemia, unspecified: Secondary | ICD-10-CM | POA: Diagnosis not present

## 2017-04-13 DIAGNOSIS — N2581 Secondary hyperparathyroidism of renal origin: Secondary | ICD-10-CM | POA: Diagnosis not present

## 2017-04-15 DIAGNOSIS — Z992 Dependence on renal dialysis: Secondary | ICD-10-CM | POA: Diagnosis not present

## 2017-04-15 DIAGNOSIS — D509 Iron deficiency anemia, unspecified: Secondary | ICD-10-CM | POA: Diagnosis not present

## 2017-04-15 DIAGNOSIS — N2581 Secondary hyperparathyroidism of renal origin: Secondary | ICD-10-CM | POA: Diagnosis not present

## 2017-04-15 DIAGNOSIS — D631 Anemia in chronic kidney disease: Secondary | ICD-10-CM | POA: Diagnosis not present

## 2017-04-15 DIAGNOSIS — N186 End stage renal disease: Secondary | ICD-10-CM | POA: Diagnosis not present

## 2017-04-19 DIAGNOSIS — N2581 Secondary hyperparathyroidism of renal origin: Secondary | ICD-10-CM | POA: Diagnosis not present

## 2017-04-19 DIAGNOSIS — D631 Anemia in chronic kidney disease: Secondary | ICD-10-CM | POA: Diagnosis not present

## 2017-04-19 DIAGNOSIS — Z992 Dependence on renal dialysis: Secondary | ICD-10-CM | POA: Diagnosis not present

## 2017-04-19 DIAGNOSIS — D509 Iron deficiency anemia, unspecified: Secondary | ICD-10-CM | POA: Diagnosis not present

## 2017-04-19 DIAGNOSIS — N186 End stage renal disease: Secondary | ICD-10-CM | POA: Diagnosis not present

## 2017-04-20 DIAGNOSIS — D509 Iron deficiency anemia, unspecified: Secondary | ICD-10-CM | POA: Diagnosis not present

## 2017-04-20 DIAGNOSIS — Z992 Dependence on renal dialysis: Secondary | ICD-10-CM | POA: Diagnosis not present

## 2017-04-20 DIAGNOSIS — N2581 Secondary hyperparathyroidism of renal origin: Secondary | ICD-10-CM | POA: Diagnosis not present

## 2017-04-20 DIAGNOSIS — N186 End stage renal disease: Secondary | ICD-10-CM | POA: Diagnosis not present

## 2017-04-20 DIAGNOSIS — D631 Anemia in chronic kidney disease: Secondary | ICD-10-CM | POA: Diagnosis not present

## 2017-04-22 DIAGNOSIS — N2581 Secondary hyperparathyroidism of renal origin: Secondary | ICD-10-CM | POA: Diagnosis not present

## 2017-04-22 DIAGNOSIS — D631 Anemia in chronic kidney disease: Secondary | ICD-10-CM | POA: Diagnosis not present

## 2017-04-22 DIAGNOSIS — N186 End stage renal disease: Secondary | ICD-10-CM | POA: Diagnosis not present

## 2017-04-22 DIAGNOSIS — D509 Iron deficiency anemia, unspecified: Secondary | ICD-10-CM | POA: Diagnosis not present

## 2017-04-22 DIAGNOSIS — Z992 Dependence on renal dialysis: Secondary | ICD-10-CM | POA: Diagnosis not present

## 2017-04-25 DIAGNOSIS — D509 Iron deficiency anemia, unspecified: Secondary | ICD-10-CM | POA: Diagnosis not present

## 2017-04-25 DIAGNOSIS — D631 Anemia in chronic kidney disease: Secondary | ICD-10-CM | POA: Diagnosis not present

## 2017-04-25 DIAGNOSIS — Z992 Dependence on renal dialysis: Secondary | ICD-10-CM | POA: Diagnosis not present

## 2017-04-25 DIAGNOSIS — N186 End stage renal disease: Secondary | ICD-10-CM | POA: Diagnosis not present

## 2017-04-25 DIAGNOSIS — N2581 Secondary hyperparathyroidism of renal origin: Secondary | ICD-10-CM | POA: Diagnosis not present

## 2017-04-27 DIAGNOSIS — D509 Iron deficiency anemia, unspecified: Secondary | ICD-10-CM | POA: Diagnosis not present

## 2017-04-27 DIAGNOSIS — D631 Anemia in chronic kidney disease: Secondary | ICD-10-CM | POA: Diagnosis not present

## 2017-04-27 DIAGNOSIS — Z992 Dependence on renal dialysis: Secondary | ICD-10-CM | POA: Diagnosis not present

## 2017-04-27 DIAGNOSIS — N2581 Secondary hyperparathyroidism of renal origin: Secondary | ICD-10-CM | POA: Diagnosis not present

## 2017-04-27 DIAGNOSIS — N186 End stage renal disease: Secondary | ICD-10-CM | POA: Diagnosis not present

## 2017-04-29 DIAGNOSIS — D509 Iron deficiency anemia, unspecified: Secondary | ICD-10-CM | POA: Diagnosis not present

## 2017-04-29 DIAGNOSIS — N186 End stage renal disease: Secondary | ICD-10-CM | POA: Diagnosis not present

## 2017-04-29 DIAGNOSIS — D631 Anemia in chronic kidney disease: Secondary | ICD-10-CM | POA: Diagnosis not present

## 2017-04-29 DIAGNOSIS — N2581 Secondary hyperparathyroidism of renal origin: Secondary | ICD-10-CM | POA: Diagnosis not present

## 2017-04-29 DIAGNOSIS — Z992 Dependence on renal dialysis: Secondary | ICD-10-CM | POA: Diagnosis not present

## 2017-05-01 DIAGNOSIS — N2581 Secondary hyperparathyroidism of renal origin: Secondary | ICD-10-CM | POA: Diagnosis not present

## 2017-05-01 DIAGNOSIS — D509 Iron deficiency anemia, unspecified: Secondary | ICD-10-CM | POA: Diagnosis not present

## 2017-05-01 DIAGNOSIS — D631 Anemia in chronic kidney disease: Secondary | ICD-10-CM | POA: Diagnosis not present

## 2017-05-01 DIAGNOSIS — N186 End stage renal disease: Secondary | ICD-10-CM | POA: Diagnosis not present

## 2017-05-01 DIAGNOSIS — Z992 Dependence on renal dialysis: Secondary | ICD-10-CM | POA: Diagnosis not present

## 2017-05-04 DIAGNOSIS — D631 Anemia in chronic kidney disease: Secondary | ICD-10-CM | POA: Diagnosis not present

## 2017-05-04 DIAGNOSIS — N2581 Secondary hyperparathyroidism of renal origin: Secondary | ICD-10-CM | POA: Diagnosis not present

## 2017-05-04 DIAGNOSIS — D509 Iron deficiency anemia, unspecified: Secondary | ICD-10-CM | POA: Diagnosis not present

## 2017-05-04 DIAGNOSIS — Z992 Dependence on renal dialysis: Secondary | ICD-10-CM | POA: Diagnosis not present

## 2017-05-04 DIAGNOSIS — N186 End stage renal disease: Secondary | ICD-10-CM | POA: Diagnosis not present

## 2017-05-05 DIAGNOSIS — Z1389 Encounter for screening for other disorder: Secondary | ICD-10-CM | POA: Diagnosis not present

## 2017-05-05 DIAGNOSIS — J449 Chronic obstructive pulmonary disease, unspecified: Secondary | ICD-10-CM | POA: Diagnosis not present

## 2017-05-05 DIAGNOSIS — E663 Overweight: Secondary | ICD-10-CM | POA: Diagnosis not present

## 2017-05-05 DIAGNOSIS — J209 Acute bronchitis, unspecified: Secondary | ICD-10-CM | POA: Diagnosis not present

## 2017-05-05 DIAGNOSIS — Z992 Dependence on renal dialysis: Secondary | ICD-10-CM | POA: Diagnosis not present

## 2017-05-05 DIAGNOSIS — Z6826 Body mass index (BMI) 26.0-26.9, adult: Secondary | ICD-10-CM | POA: Diagnosis not present

## 2017-05-06 DIAGNOSIS — D509 Iron deficiency anemia, unspecified: Secondary | ICD-10-CM | POA: Diagnosis not present

## 2017-05-06 DIAGNOSIS — N186 End stage renal disease: Secondary | ICD-10-CM | POA: Diagnosis not present

## 2017-05-06 DIAGNOSIS — D631 Anemia in chronic kidney disease: Secondary | ICD-10-CM | POA: Diagnosis not present

## 2017-05-06 DIAGNOSIS — N2581 Secondary hyperparathyroidism of renal origin: Secondary | ICD-10-CM | POA: Diagnosis not present

## 2017-05-06 DIAGNOSIS — Z992 Dependence on renal dialysis: Secondary | ICD-10-CM | POA: Diagnosis not present

## 2017-05-07 DIAGNOSIS — D631 Anemia in chronic kidney disease: Secondary | ICD-10-CM | POA: Diagnosis not present

## 2017-05-07 DIAGNOSIS — Z992 Dependence on renal dialysis: Secondary | ICD-10-CM | POA: Diagnosis not present

## 2017-05-07 DIAGNOSIS — N186 End stage renal disease: Secondary | ICD-10-CM | POA: Diagnosis not present

## 2017-05-07 DIAGNOSIS — D509 Iron deficiency anemia, unspecified: Secondary | ICD-10-CM | POA: Diagnosis not present

## 2017-05-07 DIAGNOSIS — N2581 Secondary hyperparathyroidism of renal origin: Secondary | ICD-10-CM | POA: Diagnosis not present

## 2017-05-09 DIAGNOSIS — R05 Cough: Secondary | ICD-10-CM | POA: Diagnosis not present

## 2017-05-09 DIAGNOSIS — R062 Wheezing: Secondary | ICD-10-CM | POA: Diagnosis not present

## 2017-05-09 DIAGNOSIS — Z6826 Body mass index (BMI) 26.0-26.9, adult: Secondary | ICD-10-CM | POA: Diagnosis not present

## 2017-05-09 DIAGNOSIS — N186 End stage renal disease: Secondary | ICD-10-CM | POA: Diagnosis not present

## 2017-05-09 DIAGNOSIS — E663 Overweight: Secondary | ICD-10-CM | POA: Diagnosis not present

## 2017-05-09 DIAGNOSIS — Z992 Dependence on renal dialysis: Secondary | ICD-10-CM | POA: Diagnosis not present

## 2017-05-09 DIAGNOSIS — D631 Anemia in chronic kidney disease: Secondary | ICD-10-CM | POA: Diagnosis not present

## 2017-05-09 DIAGNOSIS — D509 Iron deficiency anemia, unspecified: Secondary | ICD-10-CM | POA: Diagnosis not present

## 2017-05-09 DIAGNOSIS — N2581 Secondary hyperparathyroidism of renal origin: Secondary | ICD-10-CM | POA: Diagnosis not present

## 2017-05-10 DIAGNOSIS — Z992 Dependence on renal dialysis: Secondary | ICD-10-CM | POA: Diagnosis not present

## 2017-05-10 DIAGNOSIS — N186 End stage renal disease: Secondary | ICD-10-CM | POA: Diagnosis not present

## 2017-05-10 DIAGNOSIS — D631 Anemia in chronic kidney disease: Secondary | ICD-10-CM | POA: Diagnosis not present

## 2017-05-10 DIAGNOSIS — D509 Iron deficiency anemia, unspecified: Secondary | ICD-10-CM | POA: Diagnosis not present

## 2017-05-10 DIAGNOSIS — N2581 Secondary hyperparathyroidism of renal origin: Secondary | ICD-10-CM | POA: Diagnosis not present

## 2017-05-11 DIAGNOSIS — D509 Iron deficiency anemia, unspecified: Secondary | ICD-10-CM | POA: Diagnosis not present

## 2017-05-11 DIAGNOSIS — N2581 Secondary hyperparathyroidism of renal origin: Secondary | ICD-10-CM | POA: Diagnosis not present

## 2017-05-11 DIAGNOSIS — Z992 Dependence on renal dialysis: Secondary | ICD-10-CM | POA: Diagnosis not present

## 2017-05-11 DIAGNOSIS — N186 End stage renal disease: Secondary | ICD-10-CM | POA: Diagnosis not present

## 2017-05-11 DIAGNOSIS — D631 Anemia in chronic kidney disease: Secondary | ICD-10-CM | POA: Diagnosis not present

## 2017-05-13 DIAGNOSIS — Z992 Dependence on renal dialysis: Secondary | ICD-10-CM | POA: Diagnosis not present

## 2017-05-13 DIAGNOSIS — N2581 Secondary hyperparathyroidism of renal origin: Secondary | ICD-10-CM | POA: Diagnosis not present

## 2017-05-13 DIAGNOSIS — N186 End stage renal disease: Secondary | ICD-10-CM | POA: Diagnosis not present

## 2017-05-13 DIAGNOSIS — D631 Anemia in chronic kidney disease: Secondary | ICD-10-CM | POA: Diagnosis not present

## 2017-05-13 DIAGNOSIS — D509 Iron deficiency anemia, unspecified: Secondary | ICD-10-CM | POA: Diagnosis not present

## 2017-05-16 DIAGNOSIS — D631 Anemia in chronic kidney disease: Secondary | ICD-10-CM | POA: Diagnosis not present

## 2017-05-16 DIAGNOSIS — Z992 Dependence on renal dialysis: Secondary | ICD-10-CM | POA: Diagnosis not present

## 2017-05-16 DIAGNOSIS — D509 Iron deficiency anemia, unspecified: Secondary | ICD-10-CM | POA: Diagnosis not present

## 2017-05-16 DIAGNOSIS — N2581 Secondary hyperparathyroidism of renal origin: Secondary | ICD-10-CM | POA: Diagnosis not present

## 2017-05-16 DIAGNOSIS — N186 End stage renal disease: Secondary | ICD-10-CM | POA: Diagnosis not present

## 2017-05-18 DIAGNOSIS — D509 Iron deficiency anemia, unspecified: Secondary | ICD-10-CM | POA: Diagnosis not present

## 2017-05-18 DIAGNOSIS — N186 End stage renal disease: Secondary | ICD-10-CM | POA: Diagnosis not present

## 2017-05-18 DIAGNOSIS — Z992 Dependence on renal dialysis: Secondary | ICD-10-CM | POA: Diagnosis not present

## 2017-05-18 DIAGNOSIS — N2581 Secondary hyperparathyroidism of renal origin: Secondary | ICD-10-CM | POA: Diagnosis not present

## 2017-05-18 DIAGNOSIS — D631 Anemia in chronic kidney disease: Secondary | ICD-10-CM | POA: Diagnosis not present

## 2017-05-20 DIAGNOSIS — N2581 Secondary hyperparathyroidism of renal origin: Secondary | ICD-10-CM | POA: Diagnosis not present

## 2017-05-20 DIAGNOSIS — Z992 Dependence on renal dialysis: Secondary | ICD-10-CM | POA: Diagnosis not present

## 2017-05-20 DIAGNOSIS — N186 End stage renal disease: Secondary | ICD-10-CM | POA: Diagnosis not present

## 2017-05-20 DIAGNOSIS — D631 Anemia in chronic kidney disease: Secondary | ICD-10-CM | POA: Diagnosis not present

## 2017-05-20 DIAGNOSIS — D509 Iron deficiency anemia, unspecified: Secondary | ICD-10-CM | POA: Diagnosis not present

## 2017-05-23 DIAGNOSIS — N2581 Secondary hyperparathyroidism of renal origin: Secondary | ICD-10-CM | POA: Diagnosis not present

## 2017-05-23 DIAGNOSIS — D631 Anemia in chronic kidney disease: Secondary | ICD-10-CM | POA: Diagnosis not present

## 2017-05-23 DIAGNOSIS — N186 End stage renal disease: Secondary | ICD-10-CM | POA: Diagnosis not present

## 2017-05-23 DIAGNOSIS — D509 Iron deficiency anemia, unspecified: Secondary | ICD-10-CM | POA: Diagnosis not present

## 2017-05-23 DIAGNOSIS — Z992 Dependence on renal dialysis: Secondary | ICD-10-CM | POA: Diagnosis not present

## 2017-05-24 DIAGNOSIS — C761 Malignant neoplasm of thorax: Secondary | ICD-10-CM | POA: Diagnosis not present

## 2017-05-24 DIAGNOSIS — Z6826 Body mass index (BMI) 26.0-26.9, adult: Secondary | ICD-10-CM | POA: Diagnosis not present

## 2017-05-24 DIAGNOSIS — R0602 Shortness of breath: Secondary | ICD-10-CM | POA: Diagnosis not present

## 2017-05-24 DIAGNOSIS — K5790 Diverticulosis of intestine, part unspecified, without perforation or abscess without bleeding: Secondary | ICD-10-CM | POA: Diagnosis not present

## 2017-05-24 DIAGNOSIS — J449 Chronic obstructive pulmonary disease, unspecified: Secondary | ICD-10-CM | POA: Diagnosis not present

## 2017-05-24 DIAGNOSIS — E663 Overweight: Secondary | ICD-10-CM | POA: Diagnosis not present

## 2017-05-24 DIAGNOSIS — R911 Solitary pulmonary nodule: Secondary | ICD-10-CM | POA: Diagnosis not present

## 2017-05-24 DIAGNOSIS — R918 Other nonspecific abnormal finding of lung field: Secondary | ICD-10-CM | POA: Diagnosis not present

## 2017-05-24 DIAGNOSIS — N400 Enlarged prostate without lower urinary tract symptoms: Secondary | ICD-10-CM | POA: Diagnosis not present

## 2017-05-24 DIAGNOSIS — R05 Cough: Secondary | ICD-10-CM | POA: Diagnosis not present

## 2017-05-25 DIAGNOSIS — Z992 Dependence on renal dialysis: Secondary | ICD-10-CM | POA: Diagnosis not present

## 2017-05-25 DIAGNOSIS — N2581 Secondary hyperparathyroidism of renal origin: Secondary | ICD-10-CM | POA: Diagnosis not present

## 2017-05-25 DIAGNOSIS — D509 Iron deficiency anemia, unspecified: Secondary | ICD-10-CM | POA: Diagnosis not present

## 2017-05-25 DIAGNOSIS — N186 End stage renal disease: Secondary | ICD-10-CM | POA: Diagnosis not present

## 2017-05-25 DIAGNOSIS — D631 Anemia in chronic kidney disease: Secondary | ICD-10-CM | POA: Diagnosis not present

## 2017-05-26 ENCOUNTER — Emergency Department (HOSPITAL_COMMUNITY): Payer: Medicare Other

## 2017-05-26 ENCOUNTER — Other Ambulatory Visit: Payer: Self-pay

## 2017-05-26 ENCOUNTER — Encounter (HOSPITAL_COMMUNITY): Payer: Self-pay | Admitting: Emergency Medicine

## 2017-05-26 ENCOUNTER — Emergency Department (HOSPITAL_COMMUNITY)
Admission: EM | Admit: 2017-05-26 | Discharge: 2017-05-26 | Disposition: A | Discharge status: Home / Self Care | Payer: Medicare Other | Attending: Emergency Medicine | Admitting: Emergency Medicine

## 2017-05-26 DIAGNOSIS — Z96652 Presence of left artificial knee joint: Secondary | ICD-10-CM | POA: Diagnosis not present

## 2017-05-26 DIAGNOSIS — Z85528 Personal history of other malignant neoplasm of kidney: Secondary | ICD-10-CM | POA: Insufficient documentation

## 2017-05-26 DIAGNOSIS — J101 Influenza due to other identified influenza virus with other respiratory manifestations: Secondary | ICD-10-CM | POA: Insufficient documentation

## 2017-05-26 DIAGNOSIS — N186 End stage renal disease: Secondary | ICD-10-CM | POA: Insufficient documentation

## 2017-05-26 DIAGNOSIS — Z992 Dependence on renal dialysis: Secondary | ICD-10-CM | POA: Diagnosis not present

## 2017-05-26 DIAGNOSIS — I120 Hypertensive chronic kidney disease with stage 5 chronic kidney disease or end stage renal disease: Secondary | ICD-10-CM | POA: Insufficient documentation

## 2017-05-26 DIAGNOSIS — Z87891 Personal history of nicotine dependence: Secondary | ICD-10-CM | POA: Diagnosis not present

## 2017-05-26 DIAGNOSIS — Z85118 Personal history of other malignant neoplasm of bronchus and lung: Secondary | ICD-10-CM | POA: Diagnosis not present

## 2017-05-26 DIAGNOSIS — Z79899 Other long term (current) drug therapy: Secondary | ICD-10-CM | POA: Insufficient documentation

## 2017-05-26 DIAGNOSIS — J449 Chronic obstructive pulmonary disease, unspecified: Secondary | ICD-10-CM | POA: Insufficient documentation

## 2017-05-26 DIAGNOSIS — C3492 Malignant neoplasm of unspecified part of left bronchus or lung: Secondary | ICD-10-CM | POA: Diagnosis not present

## 2017-05-26 DIAGNOSIS — R05 Cough: Secondary | ICD-10-CM | POA: Diagnosis present

## 2017-05-26 DIAGNOSIS — R0602 Shortness of breath: Secondary | ICD-10-CM | POA: Insufficient documentation

## 2017-05-26 DIAGNOSIS — Z6826 Body mass index (BMI) 26.0-26.9, adult: Secondary | ICD-10-CM | POA: Diagnosis not present

## 2017-05-26 DIAGNOSIS — M6281 Muscle weakness (generalized): Secondary | ICD-10-CM | POA: Insufficient documentation

## 2017-05-26 DIAGNOSIS — J111 Influenza due to unidentified influenza virus with other respiratory manifestations: Secondary | ICD-10-CM | POA: Diagnosis not present

## 2017-05-26 DIAGNOSIS — J069 Acute upper respiratory infection, unspecified: Secondary | ICD-10-CM | POA: Diagnosis not present

## 2017-05-26 DIAGNOSIS — E663 Overweight: Secondary | ICD-10-CM | POA: Diagnosis not present

## 2017-05-26 LAB — CBC WITH DIFFERENTIAL/PLATELET
BASOS PCT: 0 %
Basophils Absolute: 0 10*3/uL (ref 0.0–0.1)
EOS ABS: 0 10*3/uL (ref 0.0–0.7)
Eosinophils Relative: 0 %
HEMATOCRIT: 36.4 % — AB (ref 39.0–52.0)
HEMOGLOBIN: 11.4 g/dL — AB (ref 13.0–17.0)
Lymphocytes Relative: 14 %
Lymphs Abs: 0.9 10*3/uL (ref 0.7–4.0)
MCH: 31.8 pg (ref 26.0–34.0)
MCHC: 31.3 g/dL (ref 30.0–36.0)
MCV: 101.7 fL — ABNORMAL HIGH (ref 78.0–100.0)
MONO ABS: 0.7 10*3/uL (ref 0.1–1.0)
MONOS PCT: 11 %
NEUTROS ABS: 5 10*3/uL (ref 1.7–7.7)
NEUTROS PCT: 75 %
Platelets: 177 10*3/uL (ref 150–400)
RBC: 3.58 MIL/uL — ABNORMAL LOW (ref 4.22–5.81)
RDW: 13.5 % (ref 11.5–15.5)
WBC: 6.6 10*3/uL (ref 4.0–10.5)

## 2017-05-26 LAB — BASIC METABOLIC PANEL
Anion gap: 17 — ABNORMAL HIGH (ref 5–15)
BUN: 47 mg/dL — ABNORMAL HIGH (ref 6–20)
CALCIUM: 9 mg/dL (ref 8.9–10.3)
CO2: 24 mmol/L (ref 22–32)
CREATININE: 8.01 mg/dL — AB (ref 0.61–1.24)
Chloride: 93 mmol/L — ABNORMAL LOW (ref 101–111)
GFR calc non Af Amer: 5 mL/min — ABNORMAL LOW (ref >60)
GFR, EST AFRICAN AMERICAN: 6 mL/min — AB (ref >60)
GLUCOSE: 127 mg/dL — AB (ref 65–99)
Potassium: 4.2 mmol/L (ref 3.5–5.1)
Sodium: 134 mmol/L — ABNORMAL LOW (ref 135–145)

## 2017-05-26 LAB — INFLUENZA PANEL BY PCR (TYPE A & B)
INFLAPCR: POSITIVE — AB
Influenza B By PCR: NEGATIVE

## 2017-05-26 LAB — I-STAT CG4 LACTIC ACID, ED
Lactic Acid, Venous: 2.24 mmol/L (ref 0.5–1.9)
Lactic Acid, Venous: 1.8 mmol/L (ref 0.5–1.9)

## 2017-05-26 MED ORDER — OSELTAMIVIR PHOSPHATE 75 MG PO CAPS
75.0000 mg | ORAL_CAPSULE | Freq: Once | ORAL | Status: AC
  Administered 2017-05-26: 75 mg via ORAL
  Filled 2017-05-26: qty 1

## 2017-05-26 MED ORDER — OSELTAMIVIR PHOSPHATE 75 MG PO CAPS: 75.0000 mg | ORAL_CAPSULE | Freq: Two times a day (BID) | ORAL | 0 refills | Status: DC

## 2017-05-26 MED ORDER — SODIUM CHLORIDE 0.9 % IV BOLUS (SEPSIS)
2400.0000 mL | Freq: Once | INTRAVENOUS | Status: AC
Start: 2017-05-26 — End: 2017-05-26
  Administered 2017-05-26: 2400 mL via INTRAVENOUS

## 2017-05-26 MED ORDER — ACETAMINOPHEN 325 MG PO TABS
650.0000 mg | ORAL_TABLET | Freq: Once | ORAL | Status: AC
  Administered 2017-05-26: 650 mg via ORAL
  Filled 2017-05-26: qty 2

## 2017-05-26 NOTE — ED Notes (Signed)
Pt unable to provide urine specimen at this time

## 2017-05-26 NOTE — Discharge Instructions (Signed)
A test result today was positive for influenza A.  For fever or pain use Tylenol every 4 hours.  For cough or congestion use Robitussin-DM.  Continue dialysis, but inform your center, that you have influenza A.  You will likely need to have a facemask on the entire time that you are at the dialysis center.  Return here, if needed for problems, such as weakness, dizziness, shortness of breath, or other concerns.

## 2017-05-26 NOTE — ED Notes (Signed)
Fluids stopped after I-stat Lactic result of 1.8, per MD Eulis Foster.

## 2017-05-26 NOTE — ED Triage Notes (Signed)
Patient sent from Dr. Jake Shark office stating possible pneumonia. Patient complains of cough and shortness of breath x 3 days. Patient has labored breathing in triage. History of dialysis Monday, Wednesday, and Friday.

## 2017-05-26 NOTE — ED Provider Notes (Signed)
[facility] EMERGENCY DEPARTMENT Provider Note   CSN: [id number] Arrival date & time: 05/26/17  9562     History   Chief Complaint Chief Complaint  Patient presents with  . Shortness of Breath    HPI [name] is a 84 y.o. male.  He is here for evaluation of shortness of breath with cough, that is nonproductive, present for 2 weeks despite treatment with "antibiotics," by his PCP.  He was also given an inhaler, possibly Symbicort, without relief of his discomfort.  This morning he saw his PCP who sent him here for treatment of "pneumonia,", and to likely be admitted.  He has decreased appetite for several days.  He lives alone.  He is having more trouble walking because of general weakness.  He dialyzed as usual, yesterday.  There are no other known modifying factors.  HPI  Past Medical History:  Diagnosis Date  . Arthritis    ankle , hip, knees , low back   . BPH (benign prostatic hypertrophy)   . Cancer ([place])    lung  . Cancer ([place]) 2001   renal  . Chronic inflammatory demyelinating polyneuropathy ([place]) 04/07/11   ,diagnosed 1992, tx. /w prednisone x 17 yrs. has been d/c for 3 yrs.   . Chronic kidney disease       . COPD (chronic obstructive pulmonary disease) ([place])    told that he has "a bit of emphysema"  . Dialysis patient ([place])   . GERD (gastroesophageal reflux disease)   . Hypertension    stress test done - many years ago  . Kidney calculus     Patient Active Problem List   Diagnosis Date Noted  . Pulmonary nodules/lesions, multiple 09/14/2016  . Bowel habit changes 09/11/2015  . Non-small cell cancer of left lung ([place]) 10/27/2012  . Visit for wound check 10/09/2012  . Swollen arm 09/15/2012  . Encounter for adequacy testing for hemodialysis ([place]) 09/01/2012  . Aftercare following surgery of the circulatory system, [place] 08/18/2012  . Other complications due to renal dialysis device, implant, and graft 04/19/2012  . End stage renal disease ([place])  07/14/2011  . Pre-operative cardiovascular examination 04/07/2011    Past Surgical History:  Procedure Laterality Date  . ANKLE FUSION     R ankle  . AV FISTULA PLACEMENT  06/01/2011   Procedure: ARTERIOVENOUS (AV) FISTULA CREATION;  Surgeon: Angelia Mould, MD;  Location: [facility] OR;  Service: Vascular;  Laterality: Left;  Creation of Left Arteriovenous fistula  . AV FISTULA PLACEMENT Left 07/12/2012   Procedure: ARTERIOVENOUS (AV) FISTULA CREATION;  Surgeon: Conrad Weston, MD;  Location: [place];  Service: Vascular;  Laterality: Left;  . AV FISTULA PLACEMENT Left 4.11.14  . BASCILIC VEIN TRANSPOSITION Left 08/28/2012   Procedure: BASCILIC VEIN TRANSPOSITION;  Surgeon: Conrad Huguley, MD;  Location: [place];  Service: Vascular;  Laterality: Left;  left 2nd stage basilic vein transposition  . CHOLECYSTECTOMY    . COLONOSCOPY  2005   Dr. Laural Golden, diverticulosis, hemorrhoids, 2 small polyps  . FISTULOGRAM N/A 08/16/2011   Procedure: FISTULOGRAM;  Surgeon: Angelia Mould, MD;  Location: [facility] CATH LAB;  Service: Cardiovascular;  Laterality: N/A;  . fistulogram left radial cephalic AV fistula  [id number]     Dr. Scot Dock  . HIP ARTHROPLASTY Right    "partial"  . JOINT REPLACEMENT     L knee- 2010- [facility], L hip partial replacement   . LUNG REMOVAL, PARTIAL  2003   partial, right  .  NEPHRECTOMY  2001   left  . SHUNTOGRAM N/A 05/15/2012   Procedure: Earney Mallet;  Surgeon: Conrad [place], MD;  Location: [facility] CATH LAB;  Service: Cardiovascular;  Laterality: N/A;  . SHUNTOGRAM N/A 05/09/2014   Procedure: FISTULOGRAM;  Surgeon: Conrad Iuka, MD;  Location: [facility] CATH LAB;  Service: Cardiovascular;  Laterality: N/A;       Home Medications    Prior to Admission medications   Medication Sig Start Date End Date Taking? Authorizing Provider  albuterol (PROVENTIL HFA;VENTOLIN HFA) 108 (90 Base) MCG/ACT inhaler Inhale 1-2 puffs into the lungs every 6 (six) hours as needed for wheezing or shortness of breath.    Yes [provider]  azithromycin (ZITHROMAX) 250 MG tablet Take 1-2 tablets by mouth as directed. Take 2 tablets by mouth on day 1, then take 1 tablet by mouth daily for 4 days 05/24/17  Yes [provider]  cinacalcet (SENSIPAR) 30 MG tablet Take 30 mg by mouth daily with supper.   Yes [provider]  clobetasol cream (TEMOVATE) 1.06 % Apply 1 application topically 2 (two) times daily as needed (itchy ears).   Yes [provider]  lanthanum (FOSRENOL) 1000 MG chewable tablet Chew 2,000 mg by mouth 3 (three) times daily with meals. Takes 1/2 tablet with snacks    Yes [provider]  lidocaine-prilocaine (EMLA) cream Apply 1 application topically as directed. Apply to access site for dialysis 1 hour prior to visit 12/17/14  Yes [provider]  metoprolol succinate (TOPROL-XL) 25 MG 24 hr tablet Take 25 mg by mouth every morning.    Yes [provider]  sevelamer carbonate (RENVELA) 800 MG tablet Take 800-1,600 mg by mouth as directed. Take 2 tablets (1600mg ) three times a day with meals and 1 tablet (800mg ) with a snack   Yes [provider]  oseltamivir (TAMIFLU) 75 MG capsule Take 1 capsule (75 mg total) by mouth every 12 (twelve) hours. 05/26/17   Daleen Bo, MD    Family History Family History  Problem Relation Age of Onset  . Heart disease Mother   . Heart disease Father   . Heart attack Father   . Anesthesia problems Neg Hx   . Hypotension Neg Hx   . Malignant hyperthermia Neg Hx   . Pseudochol deficiency Neg Hx   . Colon cancer Neg Hx     Social History Social History   Tobacco Use  . Smoking status: Former Smoker    Years: 35.00    Types: Cigarettes    Last attempt to quit: 02/04/1977    Years since quitting: 40.3  . Smokeless tobacco: Current User    Types: Chew  . Tobacco comment: Quit in 1978  Substance Use Topics  . Alcohol use: No    Alcohol/week: 0.0 oz  . Drug use: No     Allergies     Patient has no active allergies.   Review of Systems Review of Systems  All other systems reviewed and are negative.    Physical Exam Updated Vital Signs BP (!) 115/57   Pulse 84   Temp (!) 100.5 F (38.1 C) (Rectal)   Resp (!) 24   Ht 5\' 8"  (1.727 m)   Wt 78.9 kg (174 lb)   SpO2 96%   BMI 26.46 kg/m   Physical Exam  Constitutional: He is oriented to person, place, and time. He appears well-developed. He does not appear ill.  Elderly, frail  HENT:  Head: Normocephalic and atraumatic.  Right Ear: External ear normal.  Left Ear: External ear normal.  Eyes: Conjunctivae and EOM are normal. Pupils are equal, round, and reactive to light.  Neck: Normal range of motion and phonation normal. Neck supple.  Cardiovascular: Normal rate, regular rhythm and normal heart sounds.  Vascular fistula left upper arm with normal pulse.  Pulmonary/Chest: Effort normal. He has decreased breath sounds in the right middle field, the right lower field, the left middle field and the left lower field. He has no wheezes. He has no rhonchi. He has no rales. He exhibits no bony tenderness.  Abdominal: Soft. There is no tenderness.  Musculoskeletal: Normal range of motion.       Right lower leg: He exhibits no edema.  Neurological: He is alert and oriented to person, place, and time. No cranial nerve deficit or sensory deficit. He exhibits normal muscle tone. Coordination normal.  Skin: Skin is warm, dry and intact.  Psychiatric: He has a normal mood and affect. His behavior is normal. Judgment and thought content normal.  Nursing note and vitals reviewed.    ED Treatments / Results  Labs (all labs ordered are listed, but only abnormal results are displayed) Labs Reviewed  INFLUENZA PANEL BY PCR (TYPE A & B) - Abnormal; Notable for the following components:      Result Value   Influenza A By PCR POSITIVE (*)    All other components within normal limits  BASIC METABOLIC PANEL - Abnormal;  Notable for the following components:   Sodium 134 (*)    Chloride 93 (*)    Glucose, Bld 127 (*)    BUN 47 (*)    Creatinine, Ser 8.01 (*)    GFR calc non Af Amer 5 (*)    GFR calc Af Amer 6 (*)    Anion gap 17 (*)    All other components within normal limits  CBC WITH DIFFERENTIAL/PLATELET - Abnormal; Notable for the following components:   RBC 3.58 (*)    Hemoglobin 11.4 (*)    HCT 36.4 (*)    MCV 101.7 (*)    All other components within normal limits  I-STAT CG4 LACTIC ACID, ED - Abnormal; Notable for the following components:   Lactic Acid, Venous 2.24 (*)    All other components within normal limits  CULTURE, BLOOD (ROUTINE X 2)  CULTURE, BLOOD (ROUTINE X 2)  I-STAT CG4 LACTIC ACID, ED    EKG  EKG Interpretation  Date/Time:  Thursday May 26 2017 10:10:28 EST Ventricular Rate:  98 PR Interval:  186 QRS Duration: 92 QT Interval:  348 QTC Calculation: 444 R Axis:   -76 Text Interpretation:  Normal sinus rhythm Left anterior fascicular block Cannot rule out Anterior infarct , age undetermined Abnormal ECG Since last tracing PR interval is now normal Confirmed by Daleen Bo [phone]) on 05/26/2017 12:06:39 PM       Radiology Dg Chest 2 View  Result Date: 05/26/2017 CLINICAL DATA:  Shortness of breath. EXAM: CHEST  2 VIEW COMPARISON:  12/14/2016 chest CT and 07/12/2012 radiographs FINDINGS: The cardiac silhouette is normal in size. Thoracic aortic atherosclerosis and tortuosity are noted. Sequelae left upper lobectomy are again identified. Underlying emphysema is again noted. Scattered small lung nodules are present, better demonstrated on CT. No acute airspace consolidation, edema, pleural effusion, or pneumothorax is identified. No acute osseous abnormality is seen. IMPRESSION: 1. No evidence of acute cardiopulmonary process. 2. Emphysema, postsurgical changes, and small lung nodules as previously assessed by CT. Electronically  Signed   By: Logan Bores M.D.   On:  05/26/2017 10:59    Procedures Procedures (including critical care time)  Medications Ordered in ED Medications  sodium chloride 0.9 % bolus 2,400 mL (0 mLs Intravenous Stopped 05/26/17 1348)  acetaminophen (TYLENOL) tablet 650 mg (650 mg Oral Given 05/26/17 1404)  oseltamivir (TAMIFLU) capsule 75 mg (75 mg Oral Given 05/26/17 1418)     Initial Impression / Assessment and Plan / ED Course  I have reviewed the triage vital signs and the nursing notes.  Pertinent labs & imaging results that were available during my care of the patient were reviewed by me and considered in my medical decision making (see chart for details).      Patient Vitals for the past 24 hrs:  BP Temp Temp src Pulse Resp SpO2 Height Weight  05/26/17 1400 (!) 115/57 - - 84 (!) 24 96 % - -  05/26/17 1300 127/62 - - 83 18 91 % - -  05/26/17 1200 (!) 101/58 - - 85 (!) 21 92 % - -  05/26/17 1125 - (!) 100.5 F (38.1 C) Rectal - - - - -  05/26/17 1100 111/66 - - 89 19 94 % - -  05/26/17 1032 105/63 - - 90 17 96 % - -  05/26/17 1003 - - - - - - 5\' 8"  (1.727 m) 78.9 kg (174 lb)  05/26/17 1002 130/75 98.6 F (37 C) Oral 95 (!) 22 100 % - -    2:43 PM Reevaluation with update and discussion. After initial assessment and treatment, an updated evaluation reveals no change in clinical status.  He remains comfortable.  Findings discussed with patient, and his niece who is with him and is helping to manage his care at home.  She understands that his dialysis center will need to be contacted regarding the positive flu test today.  All questions answered. Daleen Bo     Final Clinical Impressions(s) / ED Diagnoses   Final diagnoses:  Influenza A   Influenza, seasonal, without evidence for cardiopulmonary compromise.  There is no indication for admission at this time.  Tamiflu started.  Onset of illness is unclear since he has had a respiratory illness for about 3 weeks.  Doubt ACS, PE or pneumonia.  Doubt  UTI.  Nursing Notes Reviewed/ Care Coordinated Applicable Imaging Reviewed Interpretation of Laboratory Data incorporated into ED treatment  The patient appears reasonably screened and/or stabilized for discharge and I doubt any other medical condition or other [facility] requiring further screening, evaluation, or treatment in the ED at this time prior to discharge.  Plan: Home Medications-continue usual medications, APAP for fever or aches, Robitussin-DM for cough; Home Treatments-rest, fluids; return here if the recommended treatment, does not improve the symptoms; Recommended follow up-PCP, as needed.  Continue dialysis   ED Discharge Orders        Ordered    oseltamivir (TAMIFLU) 75 MG capsule  Every 12 hours     05/26/17 1438       Daleen Bo, MD 05/26/17 1445

## 2017-05-27 DIAGNOSIS — D631 Anemia in chronic kidney disease: Secondary | ICD-10-CM | POA: Diagnosis not present

## 2017-05-27 DIAGNOSIS — N186 End stage renal disease: Secondary | ICD-10-CM | POA: Diagnosis not present

## 2017-05-27 DIAGNOSIS — Z992 Dependence on renal dialysis: Secondary | ICD-10-CM | POA: Diagnosis not present

## 2017-05-27 DIAGNOSIS — D509 Iron deficiency anemia, unspecified: Secondary | ICD-10-CM | POA: Diagnosis not present

## 2017-05-27 DIAGNOSIS — N2581 Secondary hyperparathyroidism of renal origin: Secondary | ICD-10-CM | POA: Diagnosis not present

## 2017-05-30 ENCOUNTER — Inpatient Hospital Stay (HOSPITAL_COMMUNITY)
Admission: EM | Admit: 2017-05-30 | Discharge: 2017-06-03 | DRG: 193 | Disposition: A | Discharge status: Home / Self Care | Payer: Medicare Other | Attending: Internal Medicine | Admitting: Internal Medicine

## 2017-05-30 ENCOUNTER — Other Ambulatory Visit: Payer: Self-pay

## 2017-05-30 ENCOUNTER — Encounter (HOSPITAL_COMMUNITY): Payer: Self-pay | Admitting: Emergency Medicine

## 2017-05-30 ENCOUNTER — Emergency Department (HOSPITAL_COMMUNITY): Payer: Medicare Other

## 2017-05-30 DIAGNOSIS — J1000 Influenza due to other identified influenza virus with unspecified type of pneumonia: Secondary | ICD-10-CM | POA: Diagnosis not present

## 2017-05-30 DIAGNOSIS — G6181 Chronic inflammatory demyelinating polyneuritis: Secondary | ICD-10-CM | POA: Diagnosis present

## 2017-05-30 DIAGNOSIS — R0602 Shortness of breath: Secondary | ICD-10-CM

## 2017-05-30 DIAGNOSIS — Z905 Acquired absence of kidney: Secondary | ICD-10-CM | POA: Diagnosis not present

## 2017-05-30 DIAGNOSIS — R05 Cough: Secondary | ICD-10-CM | POA: Diagnosis not present

## 2017-05-30 DIAGNOSIS — Z96642 Presence of left artificial hip joint: Secondary | ICD-10-CM | POA: Diagnosis present

## 2017-05-30 DIAGNOSIS — I120 Hypertensive chronic kidney disease with stage 5 chronic kidney disease or end stage renal disease: Secondary | ICD-10-CM | POA: Diagnosis present

## 2017-05-30 DIAGNOSIS — D509 Iron deficiency anemia, unspecified: Secondary | ICD-10-CM | POA: Diagnosis not present

## 2017-05-30 DIAGNOSIS — Z992 Dependence on renal dialysis: Secondary | ICD-10-CM | POA: Diagnosis not present

## 2017-05-30 DIAGNOSIS — J440 Chronic obstructive pulmonary disease with acute lower respiratory infection: Secondary | ICD-10-CM | POA: Diagnosis present

## 2017-05-30 DIAGNOSIS — C3492 Malignant neoplasm of unspecified part of left bronchus or lung: Secondary | ICD-10-CM | POA: Diagnosis not present

## 2017-05-30 DIAGNOSIS — N2581 Secondary hyperparathyroidism of renal origin: Secondary | ICD-10-CM | POA: Diagnosis present

## 2017-05-30 DIAGNOSIS — Z79899 Other long term (current) drug therapy: Secondary | ICD-10-CM | POA: Diagnosis not present

## 2017-05-30 DIAGNOSIS — Z96652 Presence of left artificial knee joint: Secondary | ICD-10-CM | POA: Diagnosis present

## 2017-05-30 DIAGNOSIS — N186 End stage renal disease: Secondary | ICD-10-CM | POA: Diagnosis present

## 2017-05-30 DIAGNOSIS — D631 Anemia in chronic kidney disease: Secondary | ICD-10-CM | POA: Diagnosis present

## 2017-05-30 DIAGNOSIS — I10 Essential (primary) hypertension: Secondary | ICD-10-CM | POA: Diagnosis not present

## 2017-05-30 DIAGNOSIS — R918 Other nonspecific abnormal finding of lung field: Secondary | ICD-10-CM

## 2017-05-30 DIAGNOSIS — J189 Pneumonia, unspecified organism: Secondary | ICD-10-CM | POA: Diagnosis present

## 2017-05-30 DIAGNOSIS — J09X2 Influenza due to identified novel influenza A virus with other respiratory manifestations: Secondary | ICD-10-CM

## 2017-05-30 DIAGNOSIS — Z902 Acquired absence of lung [part of]: Secondary | ICD-10-CM | POA: Diagnosis not present

## 2017-05-30 DIAGNOSIS — Z9049 Acquired absence of other specified parts of digestive tract: Secondary | ICD-10-CM | POA: Diagnosis not present

## 2017-05-30 DIAGNOSIS — Z85528 Personal history of other malignant neoplasm of kidney: Secondary | ICD-10-CM

## 2017-05-30 DIAGNOSIS — N400 Enlarged prostate without lower urinary tract symptoms: Secondary | ICD-10-CM | POA: Diagnosis present

## 2017-05-30 DIAGNOSIS — R0902 Hypoxemia: Secondary | ICD-10-CM

## 2017-05-30 DIAGNOSIS — J101 Influenza due to other identified influenza virus with other respiratory manifestations: Secondary | ICD-10-CM | POA: Diagnosis not present

## 2017-05-30 DIAGNOSIS — J449 Chronic obstructive pulmonary disease, unspecified: Secondary | ICD-10-CM | POA: Diagnosis not present

## 2017-05-30 DIAGNOSIS — K219 Gastro-esophageal reflux disease without esophagitis: Secondary | ICD-10-CM | POA: Diagnosis not present

## 2017-05-30 DIAGNOSIS — C3411 Malignant neoplasm of upper lobe, right bronchus or lung: Secondary | ICD-10-CM | POA: Diagnosis present

## 2017-05-30 DIAGNOSIS — Z720 Tobacco use: Secondary | ICD-10-CM

## 2017-05-30 DIAGNOSIS — J09X1 Influenza due to identified novel influenza A virus with pneumonia: Secondary | ICD-10-CM | POA: Diagnosis not present

## 2017-05-30 DIAGNOSIS — J441 Chronic obstructive pulmonary disease with (acute) exacerbation: Secondary | ICD-10-CM | POA: Diagnosis not present

## 2017-05-30 LAB — BASIC METABOLIC PANEL
Anion gap: 13 (ref 5–15)
BUN: 25 mg/dL — AB (ref 6–20)
CALCIUM: 8 mg/dL — AB (ref 8.9–10.3)
CO2: 29 mmol/L (ref 22–32)
CREATININE: 4.62 mg/dL — AB (ref 0.61–1.24)
Chloride: 94 mmol/L — ABNORMAL LOW (ref 101–111)
GFR calc Af Amer: 12 mL/min — ABNORMAL LOW (ref >60)
GFR, EST NON AFRICAN AMERICAN: 10 mL/min — AB (ref >60)
GLUCOSE: 91 mg/dL (ref 65–99)
Potassium: 3.7 mmol/L (ref 3.5–5.1)
SODIUM: 136 mmol/L (ref 135–145)

## 2017-05-30 LAB — CBC WITH DIFFERENTIAL/PLATELET
BASOS PCT: 0 %
Basophils Absolute: 0 10*3/uL (ref 0.0–0.1)
EOS ABS: 0 10*3/uL (ref 0.0–0.7)
EOS PCT: 0 %
HCT: 33 % — ABNORMAL LOW (ref 39.0–52.0)
HEMOGLOBIN: 10.6 g/dL — AB (ref 13.0–17.0)
LYMPHS ABS: 0.5 10*3/uL — AB (ref 0.7–4.0)
Lymphocytes Relative: 10 %
MCH: 31.8 pg (ref 26.0–34.0)
MCHC: 32.1 g/dL (ref 30.0–36.0)
MCV: 99.1 fL (ref 78.0–100.0)
Monocytes Absolute: 0.6 10*3/uL (ref 0.1–1.0)
Monocytes Relative: 13 %
NEUTROS PCT: 77 %
Neutro Abs: 3.4 10*3/uL (ref 1.7–7.7)
PLATELETS: 150 10*3/uL (ref 150–400)
RBC: 3.33 MIL/uL — AB (ref 4.22–5.81)
RDW: 12.8 % (ref 11.5–15.5)
WBC: 4.5 10*3/uL (ref 4.0–10.5)

## 2017-05-30 LAB — LACTIC ACID, PLASMA
Lactic Acid, Venous: 1.6 mmol/L (ref 0.5–1.9)
Lactic Acid, Venous: 1.4 mmol/L (ref 0.5–1.9)

## 2017-05-30 LAB — EXPECTORATED SPUTUM ASSESSMENT W REFEX TO RESP CULTURE

## 2017-05-30 LAB — MRSA PCR SCREENING: MRSA BY PCR: NEGATIVE

## 2017-05-30 LAB — BRAIN NATRIURETIC PEPTIDE: B NATRIURETIC PEPTIDE 5: 424 pg/mL — AB (ref 0.0–100.0)

## 2017-05-30 LAB — TROPONIN I: TROPONIN I: 0.03 ng/mL — AB (ref <0.03)

## 2017-05-30 LAB — EXPECTORATED SPUTUM ASSESSMENT W GRAM STAIN, RFLX TO RESP C

## 2017-05-30 MED ORDER — DEXTROSE 5 % IV SOLN
500.0000 mg | Freq: Once | INTRAVENOUS | Status: AC
  Administered 2017-05-30: 500 mg via INTRAVENOUS
  Filled 2017-05-30: qty 500

## 2017-05-30 MED ORDER — ALBUTEROL SULFATE (2.5 MG/3ML) 0.083% IN NEBU: 5.0000 mg | INHALATION_SOLUTION | Freq: Once | RESPIRATORY_TRACT | Status: DC

## 2017-05-30 MED ORDER — METOPROLOL SUCCINATE ER 25 MG PO TB24
25.0000 mg | ORAL_TABLET | Freq: Every morning | ORAL | Status: DC
  Administered 2017-05-31 – 2017-06-03 (×4): 25 mg via ORAL
  Filled 2017-05-30 (×4): qty 1

## 2017-05-30 MED ORDER — DEXTROSE 5 % IV SOLN
500.0000 mg | INTRAVENOUS | Status: AC
  Administered 2017-05-31: 500 mg via INTRAVENOUS
  Filled 2017-05-30 (×2): qty 500

## 2017-05-30 MED ORDER — IPRATROPIUM-ALBUTEROL 0.5-2.5 (3) MG/3ML IN SOLN
3.0000 mL | Freq: Four times a day (QID) | RESPIRATORY_TRACT | Status: DC
  Administered 2017-05-30 – 2017-06-01 (×9): 3 mL via RESPIRATORY_TRACT
  Filled 2017-05-30 (×9): qty 3

## 2017-05-30 MED ORDER — SEVELAMER CARBONATE 800 MG PO TABS
800.0000 mg | ORAL_TABLET | ORAL | Status: DC
  Administered 2017-05-30 – 2017-06-02 (×5): 800 mg via ORAL
  Filled 2017-05-30 (×4): qty 1

## 2017-05-30 MED ORDER — SEVELAMER CARBONATE 800 MG PO TABS
1600.0000 mg | ORAL_TABLET | Freq: Three times a day (TID) | ORAL | Status: DC
  Administered 2017-05-30 – 2017-06-03 (×11): 1600 mg via ORAL
  Filled 2017-05-30 (×13): qty 2

## 2017-05-30 MED ORDER — DEXTROSE 5 % IV SOLN
1.0000 g | Freq: Once | INTRAVENOUS | Status: AC
  Administered 2017-05-30: 1 g via INTRAVENOUS
  Filled 2017-05-30: qty 10

## 2017-05-30 MED ORDER — IPRATROPIUM-ALBUTEROL 0.5-2.5 (3) MG/3ML IN SOLN: 3.0000 mL | Freq: Three times a day (TID) | RESPIRATORY_TRACT | Status: DC

## 2017-05-30 MED ORDER — LANTHANUM CARBONATE 500 MG PO CHEW: 2000.0000 mg | CHEWABLE_TABLET | Freq: Three times a day (TID) | ORAL | Status: DC

## 2017-05-30 MED ORDER — PANTOPRAZOLE SODIUM 40 MG PO TBEC
40.0000 mg | DELAYED_RELEASE_TABLET | Freq: Every day | ORAL | Status: DC
  Administered 2017-05-31 – 2017-06-03 (×4): 40 mg via ORAL
  Filled 2017-05-30 (×4): qty 1

## 2017-05-30 MED ORDER — DEXTROSE 5 % IV SOLN
1.0000 g | INTRAVENOUS | Status: AC
  Administered 2017-05-31: 1 g via INTRAVENOUS
  Filled 2017-05-30 (×2): qty 10

## 2017-05-30 MED ORDER — IPRATROPIUM-ALBUTEROL 0.5-2.5 (3) MG/3ML IN SOLN
3.0000 mL | Freq: Once | RESPIRATORY_TRACT | Status: AC
  Administered 2017-05-30: 3 mL via RESPIRATORY_TRACT
  Filled 2017-05-30: qty 3

## 2017-05-30 MED ORDER — ALBUTEROL SULFATE (2.5 MG/3ML) 0.083% IN NEBU
2.5000 mg | INHALATION_SOLUTION | Freq: Once | RESPIRATORY_TRACT | Status: AC
  Administered 2017-05-30: 2.5 mg via RESPIRATORY_TRACT
  Filled 2017-05-30: qty 3

## 2017-05-30 MED ORDER — CINACALCET HCL 30 MG PO TABS
30.0000 mg | ORAL_TABLET | Freq: Every day | ORAL | Status: DC
  Administered 2017-05-30 – 2017-06-02 (×4): 30 mg via ORAL
  Filled 2017-05-30 (×4): qty 1

## 2017-05-30 MED ORDER — HEPARIN SODIUM (PORCINE) 5000 UNIT/ML IJ SOLN
5000.0000 [IU] | Freq: Three times a day (TID) | INTRAMUSCULAR | Status: DC
  Administered 2017-05-30 – 2017-06-02 (×11): 5000 [IU] via SUBCUTANEOUS
  Filled 2017-05-30 (×11): qty 1

## 2017-05-30 NOTE — H&P (Signed)
History and Physical  [name] XBM:[id number] DOB: 15-Sep-1930 DOA: 05/30/2017  Referring physician: Thurnell Garbe PCP: Sharilyn Sites, MD   Chief Complaint: SOB  HPI: [name] is a 84 y.o. male with end-stage renal disease on hemodialysis who was recently diagnosed with influenza A and treated in the emergency room.  The patient had been taking Tamiflu.  He had hemodialysis today but had to be placed on oxygen because of shortness of breath.  He has had intermittent shortness of breath for the past couple of days.  He denies chest pain.  Otherwise he seemed to tolerate dialysis well.  He tends to get short of breath with ambulating 10-15 feet.  He was evaluated in the emergency department and there was some concern regarding pneumonia.  ED Course: Pt received a chest x-ray suspicious for mild opacities in both lungs and possible pneumonia.  He is being admitted for further treatment.  Given his advanced age and comorbidities the influenza A with superimposed pneumonia places him at high risk and he is being placed on inpatient status.  Review of Systems: All systems reviewed and apart from history of presenting illness, are negative.  Past Medical History:  Diagnosis Date  . Arthritis    ankle , hip, knees , low back   . BPH (benign prostatic hypertrophy)   . Cancer ([place])    lung  . Cancer ([place]) 2001   renal  . Chronic inflammatory demyelinating polyneuropathy ([place]) 04/07/11   ,diagnosed 1992, tx. /w prednisone x 17 yrs. has been d/c for 3 yrs.   . Chronic kidney disease       . COPD (chronic obstructive pulmonary disease) ([place])    told that he has "a bit of emphysema"  . Dialysis patient ([place])   . GERD (gastroesophageal reflux disease)   . Hypertension    stress test done - many years ago  . Kidney calculus    Past Surgical History:  Procedure Laterality Date  . ANKLE FUSION     R ankle  . AV FISTULA PLACEMENT  06/01/2011   Procedure: ARTERIOVENOUS (AV) FISTULA  CREATION;  Surgeon: Angelia Mould, MD;  Location: [facility] OR;  Service: Vascular;  Laterality: Left;  Creation of Left Arteriovenous fistula  . AV FISTULA PLACEMENT Left 07/12/2012   Procedure: ARTERIOVENOUS (AV) FISTULA CREATION;  Surgeon: Conrad Penns Creek, MD;  Location: [place];  Service: Vascular;  Laterality: Left;  . AV FISTULA PLACEMENT Left 4.11.14  . BASCILIC VEIN TRANSPOSITION Left 08/28/2012   Procedure: BASCILIC VEIN TRANSPOSITION;  Surgeon: Conrad Sedgwick, MD;  Location: [place];  Service: Vascular;  Laterality: Left;  left 2nd stage basilic vein transposition  . CHOLECYSTECTOMY    . COLONOSCOPY  2005   Dr. Laural Golden, diverticulosis, hemorrhoids, 2 small polyps  . FISTULOGRAM N/A 08/16/2011   Procedure: FISTULOGRAM;  Surgeon: Angelia Mould, MD;  Location: [facility] CATH LAB;  Service: Cardiovascular;  Laterality: N/A;  . fistulogram left radial cephalic AV fistula  [id number]     Dr. Scot Dock  . HIP ARTHROPLASTY Right    "partial"  . JOINT REPLACEMENT     L knee- 2010- [facility], L hip partial replacement   . LUNG REMOVAL, PARTIAL  2003   partial, right  . NEPHRECTOMY  2001   left  . SHUNTOGRAM N/A 05/15/2012   Procedure: Earney Mallet;  Surgeon: Conrad Annona, MD;  Location: [facility] CATH LAB;  Service: Cardiovascular;  Laterality: N/A;  . SHUNTOGRAM N/A 05/09/2014   Procedure:  FISTULOGRAM;  Surgeon: Conrad Dayton, MD;  Location: [facility] CATH LAB;  Service: Cardiovascular;  Laterality: N/A;   Social History:  reports that he quit smoking about 40 years ago. His smoking use included cigarettes. He quit after 35.00 years of use. His smokeless tobacco use includes chew. He reports that he does not drink alcohol or use drugs.  No Known Allergies  Family History  Problem Relation Age of Onset  . Heart disease Mother   . Heart disease Father   . Heart attack Father   . Anesthesia problems Neg Hx   . Hypotension Neg Hx   . Malignant hyperthermia Neg Hx   . Pseudochol deficiency Neg Hx   . Colon cancer Neg Hx      Prior to Admission medications   Medication Sig Start Date End Date Taking? Authorizing Provider  albuterol (PROVENTIL HFA;VENTOLIN HFA) 108 (90 Base) MCG/ACT inhaler Inhale 1-2 puffs into the lungs every 6 (six) hours as needed for wheezing or shortness of breath.   Yes [provider]  cinacalcet (SENSIPAR) 30 MG tablet Take 30 mg by mouth daily with supper.   Yes [provider]  clobetasol cream (TEMOVATE) 4.13 % Apply 1 application topically 2 (two) times daily as needed (itchy ears).   Yes [provider]  lidocaine-prilocaine (EMLA) cream Apply 1 application topically as directed. Apply to access site for dialysis 1 hour prior to visit 12/17/14  Yes [provider]  metoprolol succinate (TOPROL-XL) 25 MG 24 hr tablet Take 25 mg by mouth every morning.    Yes [provider]  oseltamivir (TAMIFLU) 75 MG capsule Take 1 capsule (75 mg total) by mouth every 12 (twelve) hours. 05/26/17  Yes Daleen Bo, MD  sevelamer carbonate (RENVELA) 800 MG tablet Take 800-1,600 mg by mouth as directed. Take 2 tablets (1600mg ) three times a day with meals and 1 tablet (800mg ) with a snack   Yes [provider]  azithromycin (ZITHROMAX) 250 MG tablet Take 1-2 tablets by mouth as directed. Take 2 tablets by mouth on day 1, then take 1 tablet by mouth daily for 4 days 05/24/17   [provider]  lanthanum (FOSRENOL) 1000 MG chewable tablet Chew 2,000 mg by mouth 3 (three) times daily with meals. Takes 1/2 tablet with snacks     [provider]   Physical Exam: Vitals:   05/30/17 1230 05/30/17 1245 05/30/17 1251 05/30/17 1300  BP: (!) 140/59     Pulse: 68 70  67  Resp: (!) 21 20  18   Temp:      SpO2: 96% 97% 98% 100%  Weight:      Height:        General exam: Moderately built and nourished patient, lying comfortably supine on the gurney in no obvious distress.  Head, eyes and ENT: Nontraumatic and normocephalic. Pupils equally  reacting to light and accommodation. Oral mucosa moist.  Neck: Supple. No JVD, carotid bruit or thyromegaly.  Lymphatics: No lymphadenopathy.  Respiratory system: Clear to auscultation. No increased work of breathing.  Cardiovascular system: S1 and S2 heard, RRR. No JVD, murmurs, gallops, clicks or pedal edema.  Gastrointestinal system: Abdomen is nondistended, soft and nontender. Normal bowel sounds heard. No organomegaly or masses appreciated.  Central nervous system: Alert and oriented. No focal neurological deficits.  Extremities: Symmetric 5 x 5 power. Peripheral pulses symmetrically felt.   Skin: No rashes or acute findings.  Musculoskeletal system: Negative exam.  Psychiatry: Pleasant and cooperative.  Labs on  Admission:  Basic Metabolic Panel: Recent Labs  Lab 05/26/17 1025 05/30/17 1105  NA 134* 136  K 4.2 3.7  CL 93* 94*  CO2 24 29  GLUCOSE 127* 91  BUN 47* 25*  CREATININE 8.01* 4.62*  CALCIUM 9.0 8.0*   Liver Function Tests: No results for input(s): AST, ALT, ALKPHOS, BILITOT, PROT, ALBUMIN in the last 168 hours. No results for input(s): LIPASE, AMYLASE in the last 168 hours. No results for input(s): AMMONIA in the last 168 hours. CBC: Recent Labs  Lab 05/26/17 1025 05/30/17 1105  WBC 6.6 4.5  NEUTROABS 5.0 3.4  HGB 11.4* 10.6*  HCT 36.4* 33.0*  MCV 101.7* 99.1  PLT 177 150   Cardiac Enzymes: Recent Labs  Lab 05/30/17 1105  TROPONINI 0.03*    BNP (last 3 results) No results for input(s): PROBNP in the last 8760 hours. CBG: No results for input(s): GLUCAP in the last 168 hours.  Radiological Exams on Admission: Dg Chest 2 View  Result Date: 05/30/2017 CLINICAL DATA:  Patient has been treated for the flu last Thursday. EXAM: CHEST  2 VIEW COMPARISON:  May 26, 2017 FINDINGS: The heart size and mediastinal contours are stable. Mild patchy opacities identified bilateral lung bases. Small pulmonary nodules identified around base unchanged.  Old posttraumatic changes of left ribs are identified unchanged. IMPRESSION: Mild opacities identified in bilateral lung bases, developing pneumonia is not excluded. Electronically Signed   By: Abelardo Diesel M.D.   On: 05/30/2017 11:13   EKG: Independently reviewed. NSR  Assessment/Plan Active Problems:   End stage renal disease (HCC)   Non-small cell cancer of left lung (HCC)   Pulmonary nodules/lesions, multiple   Community acquired pneumonia   Influenza A   BPH (benign prostatic hypertrophy)   COPD (chronic obstructive pulmonary disease) (HCC)   Hypertension   GERD (gastroesophageal reflux disease)   1. Influenza A with superimposed pneumonia -given this patient's advanced comorbidities and age he is at high risk for high mortality.  He is being admitted for treatment with IV antibiotics and supportive care.  He has completed the course of Tamiflu prescribed in the ED.  He is being treated with ceftriaxone and azithromycin.  He is being delivered supplemental oxygen as needed. 2. End-stage renal disease on hemodialysis-patient has received hemodialysis earlier today.  I will consult nephrology to see him for hemodialysis in the hospital if required. 3. Non-small cell lung cancer with possible pulmonary metastasis-he is being followed outpatient with an oncologist and is currently receiving watchful waiting treatment and possibly will have a PET scan later in the month according to records. 4. History of renal cell cancer-he has had no evidence of recurrence at this time, however he is being monitored for suspicious pulmonary nodules by his oncology team. 5. BPH-reported stable. 6. COPD- nebulizer treatments ordered.  7. Essential Hypertension -resuming home medication. 8. GERD - protonix for GI protection.   DVT Prophylaxis: heparin Code Status: Full   Family Communication: bedside  Disposition Plan: home    Time spent: 63 mins  Irwin Brakeman, MD [facility] Pager  [phone]  If 7PM-7AM, please contact night-coverage [email or web address] Password [email or web address] 05/30/2017, 1:24 PM

## 2017-05-30 NOTE — ED Notes (Signed)
Date and time results received: 05/30/17 11:59 AM  (use smartphrase ".now" to insert current time)  Test: Troponin Critical Value: 0.03  Name of Provider Notified: Thurnell Garbe  Orders Received? Or Actions Taken?: Orders Received - See Orders for details

## 2017-05-30 NOTE — ED Provider Notes (Signed)
[facility] EMERGENCY DEPARTMENT Provider Note   CSN: [id number] Arrival date & time: 05/30/17  1023     History   Chief Complaint Chief Complaint  Patient presents with  . Shortness of Breath    HPI [name] is a 84 y.o. male.  HPI  Pt was seen at 1120.  Per pt, c/o gradual onset and worsening of persistent cough and SOB for the past 2 weeks. Has been associated with generalized weakness.  Pt was evaluated in the ED 4 days ago for his symptoms, dx influenza A. Pt states he continues to have symptoms. Pt was at his usual HD today and need to be placed on O2 N/C for his SOB.  Denies CP/palpitations, no back pain, no abd pain, no N/V/D, no fevers, no rash.    Past Medical History:  Diagnosis Date  . Arthritis    ankle , hip, knees , low back   . BPH (benign prostatic hypertrophy)   . Cancer ([place])    lung  . Cancer ([place]) 2001   renal  . Chronic inflammatory demyelinating polyneuropathy ([place]) 04/07/11   ,diagnosed 1992, tx. /w prednisone x 17 yrs. has been d/c for 3 yrs.   . Chronic kidney disease       . COPD (chronic obstructive pulmonary disease) ([place])    told that he has "a bit of emphysema"  . Dialysis patient ([place])   . GERD (gastroesophageal reflux disease)   . Hypertension    stress test done - many years ago  . Kidney calculus     Patient Active Problem List   Diagnosis Date Noted  . Community acquired pneumonia 05/30/2017  . Influenza A 05/30/2017  . BPH (benign prostatic hypertrophy) 05/30/2017  . COPD (chronic obstructive pulmonary disease) ([place]) 05/30/2017  . Hypertension 05/30/2017  . Pulmonary nodules/lesions, multiple 09/14/2016  . Bowel habit changes 09/11/2015  . Non-small cell cancer of left lung ([place]) 10/27/2012  . Visit for wound check 10/09/2012  . Swollen arm 09/15/2012  . Encounter for adequacy testing for hemodialysis ([place]) 09/01/2012  . Aftercare following surgery of the circulatory system, [place] 08/18/2012  . Other complications due  to renal dialysis device, implant, and graft 04/19/2012  . End stage renal disease ([place]) 07/14/2011  . Pre-operative cardiovascular examination 04/07/2011    Past Surgical History:  Procedure Laterality Date  . ANKLE FUSION     R ankle  . AV FISTULA PLACEMENT  06/01/2011   Procedure: ARTERIOVENOUS (AV) FISTULA CREATION;  Surgeon: Angelia Mould, MD;  Location: [facility] OR;  Service: Vascular;  Laterality: Left;  Creation of Left Arteriovenous fistula  . AV FISTULA PLACEMENT Left 07/12/2012   Procedure: ARTERIOVENOUS (AV) FISTULA CREATION;  Surgeon: Conrad Salem, MD;  Location: [place];  Service: Vascular;  Laterality: Left;  . AV FISTULA PLACEMENT Left 4.11.14  . BASCILIC VEIN TRANSPOSITION Left 08/28/2012   Procedure: BASCILIC VEIN TRANSPOSITION;  Surgeon: Conrad Holly, MD;  Location: [place];  Service: Vascular;  Laterality: Left;  left 2nd stage basilic vein transposition  . CHOLECYSTECTOMY    . COLONOSCOPY  2005   Dr. Laural Golden, diverticulosis, hemorrhoids, 2 small polyps  . FISTULOGRAM N/A 08/16/2011   Procedure: FISTULOGRAM;  Surgeon: Angelia Mould, MD;  Location: [facility] CATH LAB;  Service: Cardiovascular;  Laterality: N/A;  . fistulogram left radial cephalic AV fistula  03-06-2535     Dr. Scot Dock  . HIP ARTHROPLASTY Right    "partial"  . JOINT REPLACEMENT  L knee- 2010- [facility], L hip partial replacement   . LUNG REMOVAL, PARTIAL  2003   partial, right  . NEPHRECTOMY  2001   left  . SHUNTOGRAM N/A 05/15/2012   Procedure: Earney Mallet;  Surgeon: Conrad Powder River, MD;  Location: [facility] CATH LAB;  Service: Cardiovascular;  Laterality: N/A;  . SHUNTOGRAM N/A 05/09/2014   Procedure: FISTULOGRAM;  Surgeon: Conrad Aberdeen, MD;  Location: [facility] CATH LAB;  Service: Cardiovascular;  Laterality: N/A;       Home Medications    Prior to Admission medications   Medication Sig Start Date End Date Taking? Authorizing Provider  albuterol (PROVENTIL HFA;VENTOLIN HFA) 108 (90 Base) MCG/ACT inhaler Inhale 1-2  puffs into the lungs every 6 (six) hours as needed for wheezing or shortness of breath.   Yes [provider]  cinacalcet (SENSIPAR) 30 MG tablet Take 30 mg by mouth daily with supper.   Yes [provider]  clobetasol cream (TEMOVATE) 4.62 % Apply 1 application topically 2 (two) times daily as needed (itchy ears).   Yes [provider]  lidocaine-prilocaine (EMLA) cream Apply 1 application topically as directed. Apply to access site for dialysis 1 hour prior to visit 12/17/14  Yes [provider]  metoprolol succinate (TOPROL-XL) 25 MG 24 hr tablet Take 25 mg by mouth every morning.    Yes [provider]  oseltamivir (TAMIFLU) 75 MG capsule Take 1 capsule (75 mg total) by mouth every 12 (twelve) hours. 05/26/17  Yes Daleen Bo, MD  sevelamer carbonate (RENVELA) 800 MG tablet Take 800-1,600 mg by mouth as directed. Take 2 tablets (1600mg ) three times a day with meals and 1 tablet (800mg ) with a snack   Yes [provider]  azithromycin (ZITHROMAX) 250 MG tablet Take 1-2 tablets by mouth as directed. Take 2 tablets by mouth on day 1, then take 1 tablet by mouth daily for 4 days 05/24/17   [provider]  lanthanum (FOSRENOL) 1000 MG chewable tablet Chew 2,000 mg by mouth 3 (three) times daily with meals. Takes 1/2 tablet with snacks     [provider]    Family History Family History  Problem Relation Age of Onset  . Heart disease Mother   . Heart disease Father   . Heart attack Father   . Anesthesia problems Neg Hx   . Hypotension Neg Hx   . Malignant hyperthermia Neg Hx   . Pseudochol deficiency Neg Hx   . Colon cancer Neg Hx     Social History Social History   Tobacco Use  . Smoking status: Former Smoker    Years: 35.00    Types: Cigarettes    Last attempt to quit: 02/04/1977    Years since quitting: 40.3  . Smokeless tobacco: Current User    Types: Chew  . Tobacco comment: Quit in 1978  Substance Use  Topics  . Alcohol use: No    Alcohol/week: 0.0 oz  . Drug use: No     Allergies   Patient has no known allergies.   Review of Systems Review of Systems ROS: Statement: All systems negative except as marked or noted in the HPI; Constitutional: Negative for fever and chills. ; ; Eyes: Negative for eye pain, redness and discharge. ; ; ENMT: Negative for ear pain, hoarseness, nasal congestion, sinus pressure and sore throat. ; ; Cardiovascular: Negative for chest pain, palpitations, diaphoresis, and peripheral edema. ; ; Respiratory: +cough, SOB. Negative for wheezing and stridor. ; ; Gastrointestinal: Negative for  nausea, vomiting, diarrhea, abdominal pain, blood in stool, hematemesis, jaundice and rectal bleeding. . ; ; Genitourinary: Negative for dysuria, flank pain and hematuria. ; ; Musculoskeletal: Negative for back pain and neck pain. Negative for swelling and trauma.; ; Skin: Negative for pruritus, rash, abrasions, blisters, bruising and skin lesion.; ; Neuro: Negative for headache, lightheadedness and neck stiffness. Negative for weakness, altered level of consciousness, altered mental status, extremity weakness, paresthesias, involuntary movement, seizure and syncope.       Physical Exam Updated Vital Signs BP (!) 140/59   Pulse 67   Temp 98.5 F (36.9 C)   Resp 18   Ht 5\' 7"  (1.702 m)   Wt 79.4 kg (175 lb)   SpO2 100%   BMI 27.41 kg/m     12:31 Orthostatic Vital Signs TD  Orthostatic Lying   BP- Lying: 140/59  Pulse- Lying: 68      Orthostatic Sitting  BP- Sitting: 127/61  Pulse- Sitting: 77      Orthostatic Standing at 0 minutes  BP- Standing at 0 minutes: 126/56  Pulse- Standing at 0 minutes: 87      Physical Exam 1125: Physical examination:  Nursing notes reviewed; Vital signs and O2 SAT reviewed;  Constitutional: Well developed, Well nourished, Well hydrated, In no acute distress; Head:  Normocephalic, atraumatic; Eyes: EOMI, PERRL, No scleral icterus;  ENMT: Mouth and pharynx normal, Mucous membranes moist; Neck: Supple, Full range of motion, No lymphadenopathy; Cardiovascular: Regular rate and rhythm, No gallop; Respiratory: Breath sounds coarse & equal bilaterally, No wheezes.  Speaking full sentences with ease, Normal respiratory effort/excursion; Chest: Nontender, Movement normal; Abdomen: Soft, Nontender, Nondistended, Normal bowel sounds; Genitourinary: No CVA tenderness; Extremities: Pulses normal, No tenderness, No edema, No calf edema or asymmetry.; Neuro: AA&Ox3, +HOH, otherwise major CN grossly intact. No facial droop. Speech clear. No gross focal motor or sensory deficits in extremities.; Skin: Color normal, Warm, Dry.   ED Treatments / Results  Labs (all labs ordered are listed, but only abnormal results are displayed)   EKG  EKG Interpretation  Date/Time:  Monday May 30 2017 10:33:16 EST Ventricular Rate:  73 PR Interval:    QRS Duration: 96 QT Interval:  446 QTC Calculation: 492 R Axis:   -75 Text Interpretation:  Sinus rhythm Atrial premature complexes Left axis deviation Left anterior fascicular block Abnormal R-wave progression, late transition Borderline prolonged QT interval Baseline wander When compared with ECG of 05/26/2017 QT has lengthened Confirmed by Francine Graven [phone]) on 05/30/2017 11:36:13 AM       Radiology   Procedures Procedures (including critical care time)  Medications Ordered in ED Medications  cefTRIAXone (ROCEPHIN) 1 g in dextrose 5 % 50 mL IVPB (1 g Intravenous New Bag/Given 05/30/17 1242)  azithromycin (ZITHROMAX) 500 mg in dextrose 5 % 250 mL IVPB (not administered)  cefTRIAXone (ROCEPHIN) 1 g in dextrose 5 % 50 mL IVPB (not administered)  azithromycin (ZITHROMAX) 500 mg in dextrose 5 % 250 mL IVPB (not administered)  albuterol (PROVENTIL) (2.5 MG/3ML) 0.083% nebulizer solution 2.5 mg (2.5 mg Nebulization Given 05/30/17 1246)  ipratropium-albuterol (DUONEB) 0.5-2.5 (3) MG/3ML  nebulizer solution 3 mL (3 mLs Nebulization Given 05/30/17 1247)     Initial Impression / Assessment and Plan / ED Course  I have reviewed the triage vital signs and the nursing notes.  Pertinent labs & imaging results that were available during my care of the patient were reviewed by me and considered in my medical decision making (see chart for details).  MDM Reviewed: previous chart, nursing note and vitals Reviewed previous: labs and ECG Interpretation: labs, ECG and x-ray    Results for orders placed or performed during the hospital encounter of 36/14/43  Basic metabolic panel  Result Value Ref Range   Sodium 136 135 - 145 mmol/L   Potassium 3.7 3.5 - 5.1 mmol/L   Chloride 94 (L) 101 - 111 mmol/L   CO2 29 22 - 32 mmol/L   Glucose, Bld 91 65 - 99 mg/dL   BUN 25 (H) 6 - 20 mg/dL   Creatinine, Ser 4.62 (H) 0.61 - 1.24 mg/dL   Calcium 8.0 (L) 8.9 - 10.3 mg/dL   GFR calc non Af Amer 10 (L) >60 mL/min   GFR calc Af Amer 12 (L) >60 mL/min   Anion gap 13 5 - 15  Brain natriuretic peptide  Result Value Ref Range   B Natriuretic Peptide 424.0 (H) 0.0 - 100.0 pg/mL  Troponin I  Result Value Ref Range   Troponin I 0.03 (HH) <0.03 ng/mL  Lactic acid, plasma  Result Value Ref Range   Lactic Acid, Venous 1.6 0.5 - 1.9 mmol/L  CBC with Differential  Result Value Ref Range   WBC 4.5 4.0 - 10.5 K/uL   RBC 3.33 (L) 4.22 - 5.81 MIL/uL   Hemoglobin 10.6 (L) 13.0 - 17.0 g/dL   HCT 33.0 (L) 39.0 - 52.0 %   MCV 99.1 78.0 - 100.0 fL   MCH 31.8 26.0 - 34.0 pg   MCHC 32.1 30.0 - 36.0 g/dL   RDW 12.8 11.5 - 15.5 %   Platelets 150 150 - 400 K/uL   Neutrophils Relative % 77 %   Neutro Abs 3.4 1.7 - 7.7 K/uL   Lymphocytes Relative 10 %   Lymphs Abs 0.5 (L) 0.7 - 4.0 K/uL   Monocytes Relative 13 %   Monocytes Absolute 0.6 0.1 - 1.0 K/uL   Eosinophils Relative 0 %   Eosinophils Absolute 0.0 0.0 - 0.7 K/uL   Basophils Relative 0 %   Basophils Absolute 0.0 0.0 - 0.1 K/uL   Dg Chest 2  View Result Date: 05/30/2017 CLINICAL DATA:  Patient has been treated for the flu last Thursday. EXAM: CHEST  2 VIEW COMPARISON:  May 26, 2017 FINDINGS: The heart size and mediastinal contours are stable. Mild patchy opacities identified bilateral lung bases. Small pulmonary nodules identified around base unchanged. Old posttraumatic changes of left ribs are identified unchanged. IMPRESSION: Mild opacities identified in bilateral lung bases, developing pneumonia is not excluded. Electronically Signed   By: Abelardo Diesel M.D.   On: 05/30/2017 11:13    1250:  Attempted to walk pt:  Pt O2 Sat dropped to 92% R/A, but then pt became increasingly visibly SOB.  IV abx started for CAP. T/C to [facility] Dr. Wynetta Emery, case discussed, including:  HPI, pertinent PM/SHx, VS/PE, dx testing, ED course and treatment:  Agreeable to admit.   Final Clinical Impressions(s) / ED Diagnoses   Final diagnoses:  None    ED Discharge Orders    None       Francine Graven, DO 06/01/17 1516

## 2017-05-30 NOTE — ED Notes (Signed)
Ambulated pt, sats dropped to 92% while getting out of bed, pt unable to walk far, just 10-15 feet, pt became SOB, sats actually increased to 96-97%, EDP made aware

## 2017-05-30 NOTE — ED Notes (Signed)
Patient transported to X-ray 

## 2017-05-30 NOTE — ED Triage Notes (Signed)
Here last Thursday diagnosed with the flu and treated.  Had dialysis today and placed on O2 due to SOB.  Pt says he has been SOB since Sunday.  Denies pain at this time.

## 2017-05-30 NOTE — Progress Notes (Addendum)
Pharmacy Antibiotic Note  [name] is a 84 y.o. male admitted on 05/30/2017 with CAP.  Pharmacy has been consulted for Ceftriaxone and Azithromycin dosing.  Plan: Azithromycin 500 mg IV Q24 hours Ceftriaxone 1000 mg IV Q24 hours Patient prescribed Tamiflu on 1/17 in ED. Day 5 of therapy, not recommended any further doses based on renal dosing.  Height: 5\' 7"  (170.2 cm) Weight: 175 lb (79.4 kg) IBW/kg (Calculated) : 66.1  Temp (24hrs), Avg:98.5 F (36.9 C), Min:98.5 F (36.9 C), Max:98.5 F (36.9 C)  Recent Labs  Lab 05/26/17 1025 05/26/17 1133 05/26/17 1336 05/30/17 1105  WBC 6.6  --   --  4.5  CREATININE 8.01*  --   --  4.62*  LATICACIDVEN  --  2.24* 1.80 1.6    Estimated Creatinine Clearance: 11.8 mL/min (A) (by C-G formula based on SCr of 4.62 mg/dL (H)).    No Known Allergies  Antimicrobials this admission: 1/21 Azithromycin >>  1/21 Ceftriaxone >>   Dose adjustments this admission: N/A  Microbiology results:   Thank you for allowing pharmacy to be a part of this patient's care.   Margot Ables, PharmD Clinical Pharmacist 05/30/2017 1:05 PM

## 2017-05-31 LAB — CBC WITH DIFFERENTIAL/PLATELET
BASOS ABS: 0 10*3/uL (ref 0.0–0.1)
BASOS PCT: 0 %
EOS ABS: 0 10*3/uL (ref 0.0–0.7)
Eosinophils Relative: 0 %
HEMATOCRIT: 30 % — AB (ref 39.0–52.0)
HEMOGLOBIN: 9.5 g/dL — AB (ref 13.0–17.0)
LYMPHS PCT: 18 %
Lymphs Abs: 0.5 10*3/uL — ABNORMAL LOW (ref 0.7–4.0)
MCH: 31.8 pg (ref 26.0–34.0)
MCHC: 31.7 g/dL (ref 30.0–36.0)
MCV: 100.3 fL — ABNORMAL HIGH (ref 78.0–100.0)
MONOS PCT: 17 %
Monocytes Absolute: 0.4 10*3/uL (ref 0.1–1.0)
NEUTROS PCT: 65 %
Neutro Abs: 1.6 10*3/uL — ABNORMAL LOW (ref 1.7–7.7)
Platelets: 129 10*3/uL — ABNORMAL LOW (ref 150–400)
RBC: 2.99 MIL/uL — ABNORMAL LOW (ref 4.22–5.81)
RDW: 12.8 % (ref 11.5–15.5)
WBC: 2.5 10*3/uL — ABNORMAL LOW (ref 4.0–10.5)

## 2017-05-31 LAB — BASIC METABOLIC PANEL
Anion gap: 14 (ref 5–15)
BUN: 36 mg/dL — ABNORMAL HIGH (ref 6–20)
CO2: 27 mmol/L (ref 22–32)
Calcium: 7.6 mg/dL — ABNORMAL LOW (ref 8.9–10.3)
Chloride: 95 mmol/L — ABNORMAL LOW (ref 101–111)
Creatinine, Ser: 6.82 mg/dL — ABNORMAL HIGH (ref 0.61–1.24)
GFR calc non Af Amer: 7 mL/min — ABNORMAL LOW (ref >60)
GFR, EST AFRICAN AMERICAN: 8 mL/min — AB (ref >60)
Glucose, Bld: 112 mg/dL — ABNORMAL HIGH (ref 65–99)
POTASSIUM: 3.9 mmol/L (ref 3.5–5.1)
SODIUM: 136 mmol/L (ref 135–145)

## 2017-05-31 LAB — CULTURE, BLOOD (ROUTINE X 2)
CULTURE: NO GROWTH
Culture: NO GROWTH
SPECIAL REQUESTS: ADEQUATE

## 2017-05-31 MED ORDER — DOXYCYCLINE HYCLATE 100 MG PO TABS
100.0000 mg | ORAL_TABLET | Freq: Two times a day (BID) | ORAL | Status: DC
  Administered 2017-06-01 – 2017-06-03 (×5): 100 mg via ORAL
  Filled 2017-05-31 (×5): qty 1

## 2017-05-31 NOTE — Evaluation (Signed)
Physical Therapy Evaluation Patient Details Name: [name] MRN: [id number] DOB: 1930-08-18 Today's Date: 05/31/2017   History of Present Illness  [name] is a 84 y.o. male with end-stage renal disease on hemodialysis who was recently diagnosed with influenza A and treated in the emergency room.  The patient had been taking Tamiflu.  He had hemodialysis today but had to be placed on oxygen because of shortness of breath.  He has had intermittent shortness of breath for the past couple of days.  He denies chest pain.  Otherwise he seemed to tolerate dialysis well.  He tends to get short of breath with ambulating 10-15 feet.  He was evaluated in the emergency department and there was some concern regarding pneumonia.    Clinical Impression  Patient limited for functional mobility and gait as stated below secondary to severe SOB with exertion.  Patient will benefit from continued physical therapy in hospital and recommended venue below to increase strength, balance, endurance for safe ADLs and gait.    Follow Up Recommendations Home health PT;Supervision - Intermittent    Equipment Recommendations  None recommended by PT    Recommendations for Other Services       Precautions / Restrictions Precautions Precautions: Fall Restrictions Weight Bearing Restrictions: No      Mobility  Bed Mobility Overal bed mobility: Independent                Transfers Overall transfer level: Modified independent               General transfer comment: demonstrates slightly labored slower than normal movement  Ambulation/Gait Ambulation/Gait assistance: Supervision Ambulation Distance (Feet): 20 Feet Assistive device: None Gait Pattern/deviations: WFL(Within Functional Limits)   Gait velocity interpretation: Below normal speed for age/gender General Gait Details: grossly WFL except slower than normal cadence and limited mostly due to severe SOB, desaturated to 92-94% on  room that recovered to 98-100% after resting  Stairs            Wheelchair Mobility    Modified Rankin (Stroke Patients Only)       Balance Overall balance assessment: No apparent balance deficits (not formally assessed)                                           Pertinent Vitals/Pain Pain Assessment: Faces Faces Pain Scale: Hurts a little bit Pain Location: left lower abdominals when sitting up in bed Pain Descriptors / Indicators: Aching;Nagging Pain Intervention(s): Limited activity within patient's tolerance;Monitored during session    Home Living Family/patient expects to be discharged to:: Private residence Living Arrangements: Alone Available Help at Discharge: Family;Available PRN/intermittently Type of Home: House Home Access: Stairs to enter Entrance Stairs-Rails: Left Entrance Stairs-Number of Steps: 3 Home Layout: One level Home Equipment: Walker - 2 wheels;Cane - single point      Prior Function Level of Independence: Independent               Hand Dominance        Extremity/Trunk Assessment   Upper Extremity Assessment Upper Extremity Assessment: Overall WFL for tasks assessed    Lower Extremity Assessment Lower Extremity Assessment: Generalized weakness    Cervical / Trunk Assessment Cervical / Trunk Assessment: Normal  Communication   Communication: No difficulties  Cognition Arousal/Alertness: Awake/alert Behavior During Therapy: WFL for tasks assessed/performed Overall Cognitive Status: Within Functional Limits  for tasks assessed                                        General Comments      Exercises     Assessment/Plan    PT Assessment Patient needs continued PT services  PT Problem List Decreased range of motion;Decreased strength;Decreased activity tolerance;Decreased mobility       PT Treatment Interventions Gait training;Stair training;Functional mobility training;Therapeutic  activities;Therapeutic exercise;Patient/family education    PT Goals (Current goals can be found in the Care Plan section)  Acute Rehab PT Goals Patient Stated Goal: return home PT Goal Formulation: With patient Time For Goal Achievement: 06/04/17 Potential to Achieve Goals: Good    Frequency Min 3X/week   Barriers to discharge        Co-evaluation               AM-PAC PT "6 Clicks" Daily Activity  Outcome Measure Difficulty turning over in bed (including adjusting bedclothes, sheets and blankets)?: None Difficulty moving from lying on back to sitting on the side of the bed? : None Difficulty sitting down on and standing up from a chair with arms (e.g., wheelchair, bedside commode, etc,.)?: None Help needed moving to and from a bed to chair (including a wheelchair)?: None Help needed walking in hospital room?: None Help needed climbing 3-5 steps with a railing? : A Little 6 Click Score: 23    End of Session   Activity Tolerance: Patient tolerated treatment well Patient left: in chair;with call bell/phone within reach Nurse Communication: Mobility status PT Visit Diagnosis: Unsteadiness on feet (R26.81);Other abnormalities of gait and mobility (R26.89);Muscle weakness (generalized) (M62.81)    Time: 4734-0370 PT Time Calculation (min) (ACUTE ONLY): 22 min   Charges:   PT Evaluation $PT Eval Moderate Complexity: 1 Mod PT Treatments $Therapeutic Activity: 8-22 mins   PT G Codes:        3:56 PM, 2017/06/21 Lonell Grandchild, MPT Physical Therapist with [facility] [phone] office [phone] mobile phone

## 2017-05-31 NOTE — Consult Note (Signed)
Reason for Consult: End-stage renal disease Referring Physician: Dr. [name] is an 84 y.o. male.  HPI: He is a patient who has history of lung cancer, COPD, hypertension and end-stage renal disease on maintenance hemodialysis presently came with complaints of exertional dyspnea and difficulty breathing for the last 2 days.  Patient has history of cough and sputum production about 4 weeks ago.  During that time he was found to have influenza A treated with Tamiflu.  Patient was progressively improving.  However yesterday he was having some difficulty breathing and weakness and decided to come to the hospital.  Patient was dialyzed yesterday.  When he was evaluated he was found to have possible bilateral lung base infiltrate.  Hence admitted for possible pneumonia.  Patient presently is feeling somewhat better.    Past Medical History:  Diagnosis Date  . Arthritis    ankle , hip, knees , low back   . BPH (benign prostatic hypertrophy)   . Cancer ([place])    lung  . Cancer ([place]) 2001   renal  . Chronic inflammatory demyelinating polyneuropathy ([place]) 04/07/11   ,diagnosed 1992, tx. /w prednisone x 17 yrs. has been d/c for 3 yrs.   . Chronic kidney disease       . COPD (chronic obstructive pulmonary disease) ([place])    told that he has "a bit of emphysema"  . Dialysis patient ([place])   . GERD (gastroesophageal reflux disease)   . Hypertension    stress test done - many years ago  . Kidney calculus     Past Surgical History:  Procedure Laterality Date  . ANKLE FUSION     R ankle  . AV FISTULA PLACEMENT  06/01/2011   Procedure: ARTERIOVENOUS (AV) FISTULA CREATION;  Surgeon: Angelia Mould, MD;  [facility];  Service: Vascular;  Laterality: Left;  Creation of Left Arteriovenous fistula  . AV FISTULA PLACEMENT Left 07/12/2012   Procedure: ARTERIOVENOUS (AV) FISTULA CREATION;  Surgeon: Conrad Fingerville, MD;  Location: [facility];  Service: Vascular;  Laterality: Left;  . AV FISTULA  PLACEMENT Left 4.11.14  . BASCILIC VEIN TRANSPOSITION Left 08/28/2012   Procedure: BASCILIC VEIN TRANSPOSITION;  Surgeon: Conrad Jack, MD;  Location: [facility];  Service: Vascular;  Laterality: Left;  left 2nd stage basilic vein transposition  . CHOLECYSTECTOMY    . COLONOSCOPY  2005   Dr. Laural Golden, diverticulosis, hemorrhoids, 2 small polyps  . FISTULOGRAM N/A 08/16/2011   Procedure: FISTULOGRAM;  Surgeon: Angelia Mould, MD;  Location: [facility] CATH LAB;  Service: Cardiovascular;  Laterality: N/A;  . fistulogram left radial cephalic AV fistula  [phone]     Dr. Scot Dock  . HIP ARTHROPLASTY Right    "partial"  . JOINT REPLACEMENT     L knee- 2010- [facility], L hip partial replacement   . LUNG REMOVAL, PARTIAL  2003   partial, right  . NEPHRECTOMY  2001   left  . SHUNTOGRAM N/A 05/15/2012   Procedure: Earney Mallet;  Surgeon: Conrad Parker, MD;  Location: [facility] CATH LAB;  Service: Cardiovascular;  Laterality: N/A;  . SHUNTOGRAM N/A 05/09/2014   Procedure: FISTULOGRAM;  Surgeon: Conrad McMullen, MD;  Location: [facility] CATH LAB;  Service: Cardiovascular;  Laterality: N/A;    Family History  Problem Relation Age of Onset  . Heart disease Mother   . Heart disease Father   . Heart attack Father   . Anesthesia problems Neg Hx   . Hypotension Neg Hx   .  Malignant hyperthermia Neg Hx   . Pseudochol deficiency Neg Hx   . Colon cancer Neg Hx     Social History:  reports that he quit smoking about 40 years ago. His smoking use included cigarettes. He quit after 35.00 years of use. His smokeless tobacco use includes chew. He reports that he does not drink alcohol or use drugs.  Allergies: No Known Allergies  Medications: I have reviewed the patient's current medications.  Results for orders placed or performed during the hospital encounter of 05/30/17 (from the past 48 hour(s))  Basic metabolic panel     Status: Abnormal   Collection Time: 05/30/17 11:05 AM  Result Value Ref Range   Sodium 136 135 - 145 mmol/L    Potassium 3.7 3.5 - 5.1 mmol/L   Chloride 94 (L) 101 - 111 mmol/L   CO2 29 22 - 32 mmol/L   Glucose, Bld 91 65 - 99 mg/dL   BUN 25 (H) 6 - 20 mg/dL   Creatinine, Ser 4.62 (H) 0.61 - 1.24 mg/dL   Calcium 8.0 (L) 8.9 - 10.3 mg/dL   GFR calc non Af Amer 10 (L) >60 mL/min   GFR calc Af Amer 12 (L) >60 mL/min    Comment: (NOTE) The eGFR has been calculated using the CKD EPI equation. This calculation has not been validated in all clinical situations. eGFR's persistently <60 mL/min signify possible Chronic Kidney Disease.    Anion gap 13 5 - 15  Brain natriuretic peptide     Status: Abnormal   Collection Time: 05/30/17 11:05 AM  Result Value Ref Range   B Natriuretic Peptide 424.0 (H) 0.0 - 100.0 pg/mL  Troponin I     Status: Abnormal   Collection Time: 05/30/17 11:05 AM  Result Value Ref Range   Troponin I 0.03 (HH) <0.03 ng/mL    Comment: CRITICAL RESULT CALLED TO, READ BACK BY AND VERIFIED WITH: CREWS,M AT 12:00PM ON 05/30/17 BY FESTERMAN,C   Lactic acid, plasma     Status: None   Collection Time: 05/30/17 11:05 AM  Result Value Ref Range   Lactic Acid, Venous 1.6 0.5 - 1.9 mmol/L  CBC with Differential     Status: Abnormal   Collection Time: 05/30/17 11:05 AM  Result Value Ref Range   WBC 4.5 4.0 - 10.5 K/uL   RBC 3.33 (L) 4.22 - 5.81 MIL/uL   Hemoglobin 10.6 (L) 13.0 - 17.0 g/dL   HCT 33.0 (L) 39.0 - 52.0 %   MCV 99.1 78.0 - 100.0 fL   MCH 31.8 26.0 - 34.0 pg   MCHC 32.1 30.0 - 36.0 g/dL   RDW 12.8 11.5 - 15.5 %   Platelets 150 150 - 400 K/uL   Neutrophils Relative % 77 %   Neutro Abs 3.4 1.7 - 7.7 K/uL   Lymphocytes Relative 10 %   Lymphs Abs 0.5 (L) 0.7 - 4.0 K/uL   Monocytes Relative 13 %   Monocytes Absolute 0.6 0.1 - 1.0 K/uL   Eosinophils Relative 0 %   Eosinophils Absolute 0.0 0.0 - 0.7 K/uL   Basophils Relative 0 %   Basophils Absolute 0.0 0.0 - 0.1 K/uL  Lactic acid, plasma     Status: None   Collection Time: 05/30/17 12:32 PM  Result Value Ref Range    Lactic Acid, Venous 1.4 0.5 - 1.9 mmol/L  Culture, sputum-assessment     Status: None   Collection Time: 05/30/17  2:06 PM  Result Value Ref Range   Specimen  Description EXPECTORATED SPUTUM    Special Requests NONE    Sputum evaluation THIS SPECIMEN IS ACCEPTABLE FOR SPUTUM CULTURE    Report Status 05/30/2017 FINAL   MRSA PCR Screening     Status: None   Collection Time: 05/30/17  2:55 PM  Result Value Ref Range   MRSA by PCR NEGATIVE NEGATIVE    Comment:        The GeneXpert MRSA Assay (FDA approved for NASAL specimens only), is one component of a comprehensive MRSA colonization surveillance program. It is not intended to diagnose MRSA infection nor to guide or monitor treatment for MRSA infections.   CBC WITH DIFFERENTIAL     Status: Abnormal   Collection Time: 05/31/17  5:42 AM  Result Value Ref Range   WBC 2.5 (L) 4.0 - 10.5 K/uL   RBC 2.99 (L) 4.22 - 5.81 MIL/uL   Hemoglobin 9.5 (L) 13.0 - 17.0 g/dL   HCT 30.0 (L) 39.0 - 52.0 %   MCV 100.3 (H) 78.0 - 100.0 fL   MCH 31.8 26.0 - 34.0 pg   MCHC 31.7 30.0 - 36.0 g/dL   RDW 12.8 11.5 - 15.5 %   Platelets 129 (L) 150 - 400 K/uL   Neutrophils Relative % 65 %   Lymphocytes Relative 18 %   Monocytes Relative 17 %   Eosinophils Relative 0 %   Basophils Relative 0 %   Neutro Abs 1.6 (L) 1.7 - 7.7 K/uL   Lymphs Abs 0.5 (L) 0.7 - 4.0 K/uL   Monocytes Absolute 0.4 0.1 - 1.0 K/uL   Eosinophils Absolute 0.0 0.0 - 0.7 K/uL   Basophils Absolute 0.0 0.0 - 0.1 K/uL   WBC Morphology ATYPICAL LYMPHOCYTES   Basic metabolic panel     Status: Abnormal   Collection Time: 05/31/17  5:42 AM  Result Value Ref Range   Sodium 136 135 - 145 mmol/L   Potassium 3.9 3.5 - 5.1 mmol/L   Chloride 95 (L) 101 - 111 mmol/L   CO2 27 22 - 32 mmol/L   Glucose, Bld 112 (H) 65 - 99 mg/dL   BUN 36 (H) 6 - 20 mg/dL   Creatinine, Ser 6.82 (H) 0.61 - 1.24 mg/dL   Calcium 7.6 (L) 8.9 - 10.3 mg/dL   GFR calc non Af Amer 7 (L) >60 mL/min   GFR calc Af  Amer 8 (L) >60 mL/min    Comment: (NOTE) The eGFR has been calculated using the CKD EPI equation. This calculation has not been validated in all clinical situations. eGFR's persistently <60 mL/min signify possible Chronic Kidney Disease.    Anion gap 14 5 - 15    Dg Chest 2 View  Result Date: 05/30/2017 CLINICAL DATA:  Patient has been treated for the flu last Thursday. EXAM: CHEST  2 VIEW COMPARISON:  May 26, 2017 FINDINGS: The heart size and mediastinal contours are stable. Mild patchy opacities identified bilateral lung bases. Small pulmonary nodules identified around base unchanged. Old posttraumatic changes of left ribs are identified unchanged. IMPRESSION: Mild opacities identified in bilateral lung bases, developing pneumonia is not excluded. Electronically Signed   By: Abelardo Diesel M.D.   On: 05/30/2017 11:13    Review of Systems  Constitutional: Negative for chills and fever.  Respiratory: Positive for cough, shortness of breath and wheezing. Negative for sputum production.   Cardiovascular: Negative for chest pain.  Gastrointestinal: Negative for abdominal pain, nausea and vomiting.  Neurological: Positive for weakness.   Blood pressure 124/68, pulse 77,  temperature 98.8 F (37.1 C), temperature source Oral, resp. rate 18, height '5\' 7"'  (1.702 m), weight 79.4 kg (175 lb), SpO2 94 %. Physical Exam  Constitutional: He is oriented to person, place, and time. No distress.  Eyes: No scleral icterus.  Neck: No JVD present.  Cardiovascular: Normal rate and regular rhythm.  Respiratory: No respiratory distress. He has rales.  GI: He exhibits distension. There is no tenderness. There is no rebound.  Neurological: He is alert and oriented to person, place, and time.    Assessment/Plan: 1] difficulty in breathing: Possibly multifactorial including history of COPD/history of non-small cell CA with multiple nodules/pneumonia.  Patient was dialyzed yesterday and no sign of  significant fluid overload.  Normally patient has exertional dyspnea for some time before. 2] end-stage renal disease: He is status post hemodialysis yesterday.  Presently he does not have any uremic signs and symptoms and potassium is good 3] hypertension: His blood pressure is reasonably controlled 4] anemia: His hemoglobin is below our target goal 5] bone and mineral disorder: His calcium is a range. 6] patient with multiple lung nodule.  Etiology not clear.  Being followed by oncology and possible suspicious for metastasis. Plan: We will continue his present management 2] we will check his renal panel and CBC in the morning 3] will make arrangement for patient to get dialysis tomorrow and possibly try to remove about 2-1/2 L if blood pressure tolerates.  [name] S 05/31/2017, 9:16 AM

## 2017-05-31 NOTE — Progress Notes (Signed)
Patient had BP cuff applied to left arm this afternoon, nursing staff realized left arm extremity restriction right after cuff began inflating and immediately stopped and removed BP cuff. Left arm fistula still positive for bruit and thrill, no apparent bruising noted to site. Pt did not complain of pain. Left arm extremity restriction bracelet applied. Notified Dr. Wynetta Emery this evening. No orders at this time, stated nursing staff just to continue monitoring. Donavan Foil, RN

## 2017-05-31 NOTE — Plan of Care (Signed)
  Acute Rehab PT Goals(only PT should resolve) Patient Will Transfer Sit To/From Stand 05/31/2017 1558 - Progressing by Lonell Grandchild, PT Flowsheets Taken 05/31/2017 1558  Patient will transfer sit to/from stand Independently Pt Will Transfer Bed To Chair/Chair To Bed 05/31/2017 1558 - Progressing by Lonell Grandchild, PT Flowsheets Taken 05/31/2017 1558  Pt will Transfer Bed to Chair/Chair to Bed Independently Pt Will Ambulate 05/31/2017 1558 - Progressing by Lonell Grandchild, PT Flowsheets Taken 05/31/2017 1558  Pt will Ambulate with modified independence;50 feet;with rolling walker  3:58 PM, 05/31/17 Lonell Grandchild, MPT Physical Therapist with [facility] [phone] office [phone] mobile phone

## 2017-05-31 NOTE — Progress Notes (Signed)
PROGRESS NOTE    [name]  HFW:[id number]  DOB: 02-Dec-1930  DOA: 05/30/2017 PCP: Sharilyn Sites, MD   Brief Admission Hx: [name] is a 84 y.o. male with end-stage renal disease on hemodialysis who was recently diagnosed with influenza A and treated in the emergency room.  The patient had been taking Tamiflu.  He had hemodialysis today but had to be placed on oxygen because of shortness of breath.  He has had intermittent shortness of breath for the past couple of days.  He denies chest pain.  Otherwise he seemed to tolerate dialysis well.  He tends to get short of breath with ambulating 10-15 feet.  He was evaluated in the emergency department and there was some concern regarding pneumonia.  MDM/Assessment & Plan:   1. Influenza A with superimposed pneumonia -given this patient's advanced comorbidities and age he is at high risk for high mortality.  He is being admitted for treatment with IV antibiotics and supportive care.  He has completed the course of Tamiflu prescribed in the ED.  He is being treated with ceftriaxone and azithromycin.  He is being delivered supplemental oxygen as needed. 2. End-stage renal disease on hemodialysis-patient has received hemodialysis earlier today.  I will consult nephrology to see him for hemodialysis in the hospital if required. 3. Non-small cell lung cancer with possible pulmonary metastasis-he is being followed outpatient with an oncologist and is currently receiving watchful waiting treatment and possibly will have a PET scan later in the month according to records. 4. History of renal cell cancer-he has had no evidence of recurrence at this time, however he is being monitored for suspicious pulmonary nodules by his oncology team. 5. BPH-reported stable. 6. COPD- nebulizer treatments ordered.  7. Essential Hypertension -resuming home medication. 8. GERD - protonix for GI protection.   DVT Prophylaxis: heparin Code Status: Full   Family  Communication:  I spoke with power of attorney niece by telephone  Disposition Plan: home    Consultants: nephrology  Subjective: Pt says that he is feeling a lot better, wants to ambulate today.    Objective: Vitals:   05/30/17 2049 05/31/17 0137 05/31/17 0701 05/31/17 0833  BP: 119/60  124/68   Pulse: 71  77   Resp: 18     Temp: 98.2 F (36.8 C)  98.8 F (37.1 C)   TempSrc: Oral  Oral   SpO2: 94% 95% 95% 94%  Weight:      Height:        Intake/Output Summary (Last 24 hours) at 05/31/2017 0933 Last data filed at 05/30/2017 1820 Gross per 24 hour  Intake 290 ml  Output 50 ml  Net 240 ml   Filed Weights   05/30/17 1036  Weight: 79.4 kg (175 lb)     REVIEW OF SYSTEMS  As per history otherwise all reviewed and reported negative  Exam:  General exam: Awake, alert, no distress, cooperative and pleasant, sitting up in the bed. Respiratory system: Improved sounds from prior exam, better air movement bilaterally with no rails heard today. No increased work of breathing. Cardiovascular system: S1 & S2 heard. Gastrointestinal system: Abdomen is nondistended, soft and nontender. Normal bowel sounds heard. Central nervous system: Alert and oriented. No focal neurological deficits. Extremities: no CCE.  Data Reviewed: Basic Metabolic Panel: Recent Labs  Lab 05/26/17 1025 05/30/17 1105 05/31/17 0542  NA 134* 136 136  K 4.2 3.7 3.9  CL 93* 94* 95*  CO2 24 29 27   GLUCOSE  127* 91 112*  BUN 47* 25* 36*  CREATININE 8.01* 4.62* 6.82*  CALCIUM 9.0 8.0* 7.6*   Liver Function Tests: No results for input(s): AST, ALT, ALKPHOS, BILITOT, PROT, ALBUMIN in the last 168 hours. No results for input(s): LIPASE, AMYLASE in the last 168 hours. No results for input(s): AMMONIA in the last 168 hours. CBC: Recent Labs  Lab 05/26/17 1025 05/30/17 1105 05/31/17 0542  WBC 6.6 4.5 2.5*  NEUTROABS 5.0 3.4 1.6*  HGB 11.4* 10.6* 9.5*  HCT 36.4* 33.0* 30.0*  MCV 101.7* 99.1 100.3*    PLT 177 150 129*   Cardiac Enzymes: Recent Labs  Lab 05/30/17 1105  TROPONINI 0.03*   CBG (last 3)  No results for input(s): GLUCAP in the last 72 hours. Recent Results (from the past 240 hour(s))  Culture, blood (routine x 2)     Status: None   Collection Time: 05/26/17 11:16 AM  Result Value Ref Range Status   Specimen Description   Final    BLOOD RIGHT WRIST BOTTLES DRAWN AEROBIC AND ANAEROBIC   Special Requests   Final    Blood Culture results may not be optimal due to an inadequate volume of blood received in culture bottles   Culture NO GROWTH 5 DAYS  Final   Report Status 05/31/2017 FINAL  Final  Culture, blood (routine x 2)     Status: None   Collection Time: 05/26/17 11:16 AM  Result Value Ref Range Status   Specimen Description   Final    BLOOD RIGHT ARM BOTTLES DRAWN AEROBIC AND ANAEROBIC   Special Requests Blood Culture adequate volume  Final   Culture NO GROWTH 5 DAYS  Final   Report Status 05/31/2017 FINAL  Final  Culture, sputum-assessment     Status: None   Collection Time: 05/30/17  2:06 PM  Result Value Ref Range Status   Specimen Description EXPECTORATED SPUTUM  Final   Special Requests NONE  Final   Sputum evaluation THIS SPECIMEN IS ACCEPTABLE FOR SPUTUM CULTURE  Final   Report Status 05/30/2017 FINAL  Final  MRSA PCR Screening     Status: None   Collection Time: 05/30/17  2:55 PM  Result Value Ref Range Status   MRSA by PCR NEGATIVE NEGATIVE Final    Comment:        The GeneXpert MRSA Assay (FDA approved for NASAL specimens only), is one component of a comprehensive MRSA colonization surveillance program. It is not intended to diagnose MRSA infection nor to guide or monitor treatment for MRSA infections.      Studies: Dg Chest 2 View  Result Date: 05/30/2017 CLINICAL DATA:  Patient has been treated for the flu last Thursday. EXAM: CHEST  2 VIEW COMPARISON:  May 26, 2017 FINDINGS: The heart size and mediastinal contours are stable.  Mild patchy opacities identified bilateral lung bases. Small pulmonary nodules identified around base unchanged. Old posttraumatic changes of left ribs are identified unchanged. IMPRESSION: Mild opacities identified in bilateral lung bases, developing pneumonia is not excluded. Electronically Signed   By: Abelardo Diesel M.D.   On: 05/30/2017 11:13   Scheduled Meds: . cinacalcet  30 mg Oral Q supper  . heparin  5,000 Units Subcutaneous Q8H  . ipratropium-albuterol  3 mL Nebulization Q6H  . metoprolol succinate  25 mg Oral q morning - 10a  . pantoprazole  40 mg Oral Q0600  . sevelamer carbonate  1,600 mg Oral TID WC  . sevelamer carbonate  800 mg Oral With snacks  Continuous Infusions: . azithromycin    . cefTRIAXone (ROCEPHIN)  IV      Active Problems:   End stage renal disease ([facility])   Non-small cell cancer of left lung ([facility])   Pulmonary nodules/lesions, multiple   Community acquired pneumonia   Influenza A   BPH (benign prostatic hypertrophy)   COPD (chronic obstructive pulmonary disease) ([facility])   Hypertension   GERD (gastroesophageal reflux disease)   Time spent:   Irwin Brakeman, MD, FAAFP [facility] Pager [phone]  If 7PM-7AM, please contact night-coverage [email or web address] Password [facility] 05/31/2017, 9:33 AM    LOS: 1 day

## 2017-05-31 NOTE — Care Management Note (Addendum)
Case Management Note  Patient Details  Name: [name] MRN: [id number] Date of Birth: December 24, 1930  Subjective/Objective:   Adm with CAP. Recent DX of flu. ESRD on HD. Patient reports ind with ADL's. Has RW, cane at home if needed. Has PCP, Dr. Hilma Favors. Still drives.              Action/Plan: DC home. CM following for needs. Currently on room air. PT eval pending. Patient unsure if he would be agreeable to [facility]    Expected Discharge Date:   06/02/2016               Expected Discharge Plan:     In-House Referral:     Discharge planning Services  CM Consult  Post Acute Care Choice:    Choice offered to:     DME Arranged:    DME Agency:     HH Arranged:    HH Agency:     Status of Service:  In process, will continue to follow  If discussed at Long Length of Stay Meetings, dates discussed:    Additional Comments:  Cris Gibby, Chauncey Reading, RN 05/31/2017, 12:28 PM

## 2017-06-01 DIAGNOSIS — J09X2 Influenza due to identified novel influenza A virus with other respiratory manifestations: Secondary | ICD-10-CM

## 2017-06-01 DIAGNOSIS — J441 Chronic obstructive pulmonary disease with (acute) exacerbation: Secondary | ICD-10-CM

## 2017-06-01 DIAGNOSIS — C3492 Malignant neoplasm of unspecified part of left bronchus or lung: Secondary | ICD-10-CM

## 2017-06-01 DIAGNOSIS — J189 Pneumonia, unspecified organism: Secondary | ICD-10-CM

## 2017-06-01 DIAGNOSIS — N186 End stage renal disease: Secondary | ICD-10-CM

## 2017-06-01 DIAGNOSIS — K219 Gastro-esophageal reflux disease without esophagitis: Secondary | ICD-10-CM

## 2017-06-01 DIAGNOSIS — N400 Enlarged prostate without lower urinary tract symptoms: Secondary | ICD-10-CM

## 2017-06-01 LAB — BASIC METABOLIC PANEL
ANION GAP: 16 — AB (ref 5–15)
BUN: 50 mg/dL — ABNORMAL HIGH (ref 6–20)
CALCIUM: 7.8 mg/dL — AB (ref 8.9–10.3)
CO2: 24 mmol/L (ref 22–32)
Chloride: 95 mmol/L — ABNORMAL LOW (ref 101–111)
Creatinine, Ser: 8.48 mg/dL — ABNORMAL HIGH (ref 0.61–1.24)
GFR, EST AFRICAN AMERICAN: 6 mL/min — AB (ref >60)
GFR, EST NON AFRICAN AMERICAN: 5 mL/min — AB (ref >60)
Glucose, Bld: 109 mg/dL — ABNORMAL HIGH (ref 65–99)
POTASSIUM: 4.2 mmol/L (ref 3.5–5.1)
Sodium: 135 mmol/L (ref 135–145)

## 2017-06-01 LAB — CBC
HEMATOCRIT: 29.9 % — AB (ref 39.0–52.0)
HEMOGLOBIN: 9.5 g/dL — AB (ref 13.0–17.0)
MCH: 31.8 pg (ref 26.0–34.0)
MCHC: 31.8 g/dL (ref 30.0–36.0)
MCV: 100 fL (ref 78.0–100.0)
Platelets: 143 10*3/uL — ABNORMAL LOW (ref 150–400)
RBC: 2.99 MIL/uL — AB (ref 4.22–5.81)
RDW: 12.6 % (ref 11.5–15.5)
WBC: 3.5 10*3/uL — ABNORMAL LOW (ref 4.0–10.5)

## 2017-06-01 LAB — RENAL FUNCTION PANEL
ALBUMIN: 2.9 g/dL — AB (ref 3.5–5.0)
ANION GAP: 15 (ref 5–15)
BUN: 50 mg/dL — ABNORMAL HIGH (ref 6–20)
CO2: 25 mmol/L (ref 22–32)
Calcium: 7.8 mg/dL — ABNORMAL LOW (ref 8.9–10.3)
Chloride: 95 mmol/L — ABNORMAL LOW (ref 101–111)
Creatinine, Ser: 8.38 mg/dL — ABNORMAL HIGH (ref 0.61–1.24)
GFR calc non Af Amer: 5 mL/min — ABNORMAL LOW (ref >60)
GFR, EST AFRICAN AMERICAN: 6 mL/min — AB (ref >60)
Glucose, Bld: 108 mg/dL — ABNORMAL HIGH (ref 65–99)
PHOSPHORUS: 4.3 mg/dL (ref 2.5–4.6)
Potassium: 4.2 mmol/L (ref 3.5–5.1)
Sodium: 135 mmol/L (ref 135–145)

## 2017-06-01 LAB — HEPATITIS B SURFACE ANTIGEN: Hepatitis B Surface Ag: NEGATIVE

## 2017-06-01 MED ORDER — ALBUTEROL SULFATE (2.5 MG/3ML) 0.083% IN NEBU
2.5000 mg | INHALATION_SOLUTION | RESPIRATORY_TRACT | Status: DC | PRN
Start: 2017-06-01 — End: 2017-06-03

## 2017-06-01 MED ORDER — METHYLPREDNISOLONE SODIUM SUCC 40 MG IJ SOLR
40.0000 mg | Freq: Two times a day (BID) | INTRAMUSCULAR | Status: DC
  Administered 2017-06-01 – 2017-06-03 (×5): 40 mg via INTRAVENOUS
  Filled 2017-06-01 (×5): qty 1

## 2017-06-01 MED ORDER — IPRATROPIUM-ALBUTEROL 0.5-2.5 (3) MG/3ML IN SOLN
3.0000 mL | Freq: Three times a day (TID) | RESPIRATORY_TRACT | Status: DC
  Administered 2017-06-02 – 2017-06-03 (×5): 3 mL via RESPIRATORY_TRACT
  Filled 2017-06-01 (×5): qty 3

## 2017-06-01 MED ORDER — LIDOCAINE HCL (PF) 1 % IJ SOLN: 5.0000 mL | INTRAMUSCULAR | Status: DC | PRN

## 2017-06-01 MED ORDER — PENTAFLUOROPROP-TETRAFLUOROETH EX AERO: 1.0000 "application " | INHALATION_SPRAY | CUTANEOUS | Status: DC | PRN

## 2017-06-01 MED ORDER — LIDOCAINE-PRILOCAINE 2.5-2.5 % EX CREA: 1.0000 "application " | TOPICAL_CREAM | CUTANEOUS | Status: DC | PRN

## 2017-06-01 MED ORDER — SODIUM CHLORIDE 0.9 % IV SOLN: 100.0000 mL | INTRAVENOUS | Status: DC | PRN

## 2017-06-01 MED ORDER — GUAIFENESIN ER 600 MG PO TB12
1200.0000 mg | ORAL_TABLET | Freq: Two times a day (BID) | ORAL | Status: DC
  Administered 2017-06-01 – 2017-06-03 (×5): 1200 mg via ORAL
  Filled 2017-06-01 (×5): qty 2

## 2017-06-01 NOTE — Procedures (Signed)
     HEMODIALYSIS TREATMENT NOTE:   4 hour heparin-free dialysis completed via left upper arm AVF (16g/antegrade). Goal met: 2.5 liters removed without interruption in ultrafiltration.  All blood was returned and hemostasis was achieved in 10 minutes.  Rockwell Alexandria, RN, CDN

## 2017-06-01 NOTE — Progress Notes (Signed)
[name]  MRN: [id number]  DOB/AGE: Apr 09, 1931 84 y.o.  Primary Care Physician:Golding, [name], MD  Admit date: 05/30/2017  Chief Complaint:  Chief Complaint  Patient presents with  . Shortness of Breath    S-Pt presented on  05/30/2017 with  Chief Complaint  Patient presents with  . Shortness of Breath  .    Pt says " I am a little better "   meds . cinacalcet  30 mg Oral Q supper  . doxycycline  100 mg Oral Q12H  . heparin  5,000 Units Subcutaneous Q8H  . ipratropium-albuterol  3 mL Nebulization Q6H  . metoprolol succinate  25 mg Oral q morning - 10a  . pantoprazole  40 mg Oral Q0600  . sevelamer carbonate  1,600 mg Oral TID WC  . sevelamer carbonate  800 mg Oral With snacks    Physical Exam: Vital signs in last 24 hours: Temp:  [98.1 F (36.7 C)-98.5 F (36.9 C)] 98.4 F (36.9 C) (01/23 0537) Pulse Rate:  [67-84] 74 (01/23 0537) Resp:  [18-20] 18 (01/22 2115) BP: (146-158)/(62-82) 149/62 (01/23 0537) SpO2:  [92 %-98 %] 93 % (01/23 0804) Weight change:  Last BM Date: 06/01/17  Intake/Output from previous day: No intake/output data recorded. No intake/output data recorded.   Physical Exam: General- pt is awake,alert, oriented to time place and person Resp- No acute REsp distress,  Rhonchi+ CVS- S1S2 regular in rate and rhythm GIT- BS+, soft, NT, ND EXT- NO LE Edema, NO Cyanosis Access -left AVF+  Lab Results: CBC Recent Labs    05/31/17 0542 06/01/17 0521  WBC 2.5* 3.5*  HGB 9.5* 9.5*  HCT 30.0* 29.9*  PLT 129* 143*    BMET Recent Labs    05/31/17 0542 06/01/17 0521  NA 136 135  135  K 3.9 4.2  4.2  CL 95* 95*  95*  CO2 27 25  24   GLUCOSE 112* 108*  109*  BUN 36* 50*  50*  CREATININE 6.82* 8.38*  8.48*  CALCIUM 7.6* 7.8*  7.8*    MICRO Recent Results (from the past 240 hour(s))  Culture, blood (routine x 2)     Status: None   Collection Time: 05/26/17 11:16 AM  Result Value Ref Range Status   Specimen Description    Final    BLOOD RIGHT WRIST BOTTLES DRAWN AEROBIC AND ANAEROBIC   Special Requests   Final    Blood Culture results may not be optimal due to an inadequate volume of blood received in culture bottles   Culture NO GROWTH 5 DAYS  Final   Report Status 05/31/2017 FINAL  Final  Culture, blood (routine x 2)     Status: None   Collection Time: 05/26/17 11:16 AM  Result Value Ref Range Status   Specimen Description   Final    BLOOD RIGHT ARM BOTTLES DRAWN AEROBIC AND ANAEROBIC   Special Requests Blood Culture adequate volume  Final   Culture NO GROWTH 5 DAYS  Final   Report Status 05/31/2017 FINAL  Final  Culture, sputum-assessment     Status: None   Collection Time: 05/30/17  2:06 PM  Result Value Ref Range Status   Specimen Description EXPECTORATED SPUTUM  Final   Special Requests NONE  Final   Sputum evaluation THIS SPECIMEN IS ACCEPTABLE FOR SPUTUM CULTURE  Final   Report Status 05/30/2017 FINAL  Final  MRSA PCR Screening     Status: None   Collection Time: 05/30/17  2:55 PM  Result  Value Ref Range Status   MRSA by PCR NEGATIVE NEGATIVE Final    Comment:        The GeneXpert MRSA Assay (FDA approved for NASAL specimens only), is one component of a comprehensive MRSA colonization surveillance program. It is not intended to diagnose MRSA infection nor to guide or monitor treatment for MRSA infections.       Lab Results  Component Value Date   CALCIUM 7.8 (L) 06/01/2017   CALCIUM 7.8 (L) 06/01/2017   CAION 1.12 (L) 05/09/2014   PHOS 4.3 06/01/2017    Alb  2.9 Corrected calcium 7.8+ 0.9=8.7     Impression: 1)Renal  ESRD on HD                Pt is on Tues/Thursday/Saturday schedule               Will dialyze today to help with fluid overload  2)HTN  Medication- On Beta blockers    3)Anemia in ESRD the goal for HGb is 9--11. Hgb is at goal   4)CKD Mineral-Bone Disorder  Secondary Hyperparathyroidism  Present.     On senipar  Hx of High Phosphorus  .     On binders   Calcium when corrected for albumin is at goal.  5)ID-recently diagnosed with Flu On antivirals PMD following  6)Electrolytes Normokalemic NOrmonatremic   7)Acid base Co2 at goal        Plan:  Will dialyze today will use 3 k bath       Donaciano Range S 06/01/2017, 9:14 AM

## 2017-06-01 NOTE — Progress Notes (Signed)
PROGRESS NOTE    [name]  GQQ:[id number]  DOB: 01-Jan-1931  DOA: 05/30/2017 PCP: Sharilyn Sites, MD   Brief Admission Hx: [name] is a 84 y.o. male with end-stage renal disease on hemodialysis who was recently diagnosed with influenza A and treated in the emergency room.  The patient had been taking Tamiflu.  He had hemodialysis today but had to be placed on oxygen because of shortness of breath.  He has had intermittent shortness of breath for the past couple of days.  He denies chest pain.  Otherwise he seemed to tolerate dialysis well.  He tends to get short of breath with ambulating 10-15 feet.  He was evaluated in the emergency department and there was some concern regarding pneumonia.  MDM/Assessment & Plan:   1. Influenza A with superimposed pneumonia -given this patient's advanced comorbidities and age he is at high risk for high mortality.  He is being admitted for treatment with IV antibiotics and supportive care.  He has completed the course of Tamiflu prescribed in the ED.  He is being treated with ceftriaxone and azithromycin.  He is being delivered supplemental oxygen as needed. 2. End-stage renal disease on hemodialysis nephrology consulted for dialysis needs. 3. Non-small cell lung cancer with possible pulmonary metastasis-he is being followed outpatient with an oncologist and is currently receiving watchful waiting treatment and possibly will have a PET scan later in the month according to records. 4. History of renal cell cancer-he has had no evidence of recurrence at this time, however he is being monitored for suspicious pulmonary nodules by his oncology team. 5. BPH-reported stable. 6. COPD exacerbation.  Likely precipitated by influenza/pneumonia- continues to feel short of breath and wheezing.  Will continue nebulizer treatments.  Add steroids. 7. Essential Hypertension -continue home medication. 8. GERD - protonix for GI protection.   DVT Prophylaxis:  heparin Code Status: Full   Family Communication:  No family present Disposition Plan: home when respiratory status is better  Consultants: nephrology  Subjective: Still feels short of breath.  Not back to baseline.  Continues to wheeze.  Objective: Vitals:   06/01/17 0804 06/01/17 1018 06/01/17 1407 06/01/17 1411  BP:  139/62 (!) 153/77   Pulse:  79 76   Resp:   18   Temp:   98.4 F (36.9 C)   TempSrc:      SpO2: 93%  95% 94%  Weight:      Height:        Intake/Output Summary (Last 24 hours) at 06/01/2017 1707 Last data filed at 06/01/2017 1300 Gross per 24 hour  Intake 480 ml  Output -  Net 480 ml   Filed Weights   05/30/17 1036  Weight: 79.4 kg (175 lb)     REVIEW OF SYSTEMS  As per history otherwise all reviewed and reported negative  Exam:  General exam: Awake, alert, no distress, cooperative and pleasant, sitting up in the bed. Respiratory system: Bilateral wheezes and diminished breath sounds Cardiovascular system: S1 & S2 heard. Gastrointestinal system: Abdomen is nondistended, soft and nontender. Normal bowel sounds heard. Central nervous system: Alert and oriented. No focal neurological deficits. Extremities: no CCE.  Data Reviewed: Basic Metabolic Panel: Recent Labs  Lab 05/26/17 1025 05/30/17 1105 05/31/17 0542 06/01/17 0521  NA 134* 136 136 135  135  K 4.2 3.7 3.9 4.2  4.2  CL 93* 94* 95* 95*  95*  CO2 24 29 27 25  24   GLUCOSE 127* 91 112* 108*  109*  BUN 47* 25* 36* 50*  50*  CREATININE 8.01* 4.62* 6.82* 8.38*  8.48*  CALCIUM 9.0 8.0* 7.6* 7.8*  7.8*  PHOS  --   --   --  4.3   Liver Function Tests: Recent Labs  Lab 06/01/17 0521  ALBUMIN 2.9*   No results for input(s): LIPASE, AMYLASE in the last 168 hours. No results for input(s): AMMONIA in the last 168 hours. CBC: Recent Labs  Lab 05/26/17 1025 05/30/17 1105 05/31/17 0542 06/01/17 0521  WBC 6.6 4.5 2.5* 3.5*  NEUTROABS 5.0 3.4 1.6*  --   HGB 11.4* 10.6* 9.5*  9.5*  HCT 36.4* 33.0* 30.0* 29.9*  MCV 101.7* 99.1 100.3* 100.0  PLT 177 150 129* 143*   Cardiac Enzymes: Recent Labs  Lab 05/30/17 1105  TROPONINI 0.03*   CBG (last 3)  No results for input(s): GLUCAP in the last 72 hours. Recent Results (from the past 240 hour(s))  Culture, blood (routine x 2)     Status: None   Collection Time: 05/26/17 11:16 AM  Result Value Ref Range Status   Specimen Description   Final    BLOOD RIGHT WRIST BOTTLES DRAWN AEROBIC AND ANAEROBIC   Special Requests   Final    Blood Culture results may not be optimal due to an inadequate volume of blood received in culture bottles   Culture NO GROWTH 5 DAYS  Final   Report Status 05/31/2017 FINAL  Final  Culture, blood (routine x 2)     Status: None   Collection Time: 05/26/17 11:16 AM  Result Value Ref Range Status   Specimen Description   Final    BLOOD RIGHT ARM BOTTLES DRAWN AEROBIC AND ANAEROBIC   Special Requests Blood Culture adequate volume  Final   Culture NO GROWTH 5 DAYS  Final   Report Status 05/31/2017 FINAL  Final  Culture, sputum-assessment     Status: None   Collection Time: 05/30/17  2:06 PM  Result Value Ref Range Status   Specimen Description EXPECTORATED SPUTUM  Final   Special Requests NONE  Final   Sputum evaluation THIS SPECIMEN IS ACCEPTABLE FOR SPUTUM CULTURE  Final   Report Status 05/30/2017 FINAL  Final  MRSA PCR Screening     Status: None   Collection Time: 05/30/17  2:55 PM  Result Value Ref Range Status   MRSA by PCR NEGATIVE NEGATIVE Final    Comment:        The GeneXpert MRSA Assay (FDA approved for NASAL specimens only), is one component of a comprehensive MRSA colonization surveillance program. It is not intended to diagnose MRSA infection nor to guide or monitor treatment for MRSA infections.      Studies: No results found. Scheduled Meds: . cinacalcet  30 mg Oral Q supper  . doxycycline  100 mg Oral Q12H  . guaiFENesin  1,200 mg Oral BID  . heparin   5,000 Units Subcutaneous Q8H  . ipratropium-albuterol  3 mL Nebulization Q6H  . methylPREDNISolone (SOLU-MEDROL) injection  40 mg Intravenous Q12H  . metoprolol succinate  25 mg Oral q morning - 10a  . pantoprazole  40 mg Oral Q0600  . sevelamer carbonate  1,600 mg Oral TID WC  . sevelamer carbonate  800 mg Oral With snacks   Continuous Infusions: . sodium chloride    . sodium chloride      Active Problems:   End stage renal disease (HCC)   Non-small cell cancer of left lung [facility])   Pulmonary  nodules/lesions, multiple   Community acquired pneumonia   Influenza A   BPH (benign prostatic hypertrophy)   COPD (chronic obstructive pulmonary disease) (HCC)   Hypertension   GERD (gastroesophageal reflux disease)   Time spent: 53mins  Kathie Dike, MD [facility] Pager [phone]  If 7PM-7AM, please contact night-coverage [email or web address] Password [email or web address] 06/01/2017, 5:07 PM    LOS: 2 days

## 2017-06-02 NOTE — Progress Notes (Signed)
Physical Therapy Treatment Patient Details Name: [name] MRN: [id number] DOB: March 27, 1931 Today's Date: 06/02/2017    History of Present Illness [name] is a 84 y.o. male with end-stage renal disease on hemodialysis who was recently diagnosed with influenza A and treated in the emergency room.  The patient had been taking Tamiflu.  He had hemodialysis today but had to be placed on oxygen because of shortness of breath.  He has had intermittent shortness of breath for the past couple of days.  He denies chest pain.  Otherwise he seemed to tolerate dialysis well.  He tends to get short of breath with ambulating 10-15 feet.  He was evaluated in the emergency department and there was some concern regarding pneumonia.    PT Comments    Patient presents up in chair agreeable for therapy, presents on room air, demonstrates increased endurance/distance for walking with less visible signs of SOB, limited secondary to fatigue and O2 desaturation down to 84%, able to recover above 94% after sitting and resting for approximately 2-3 minutes and sat at bedside to eat lunch after therapy.  Patient will benefit from continued physical therapy in hospital and recommended venue below to increase strength, balance, endurance for safe ADLs and gait.    Follow Up Recommendations  Home health PT;Supervision - Intermittent     Equipment Recommendations  None recommended by PT    Recommendations for Other Services       Precautions / Restrictions Precautions Precautions: Fall Restrictions Weight Bearing Restrictions: No    Mobility  Bed Mobility Overal bed mobility: Independent                Transfers Overall transfer level: Modified independent                  Ambulation/Gait Ambulation/Gait assistance: Supervision Ambulation Distance (Feet): 35 Feet Assistive device: None Gait Pattern/deviations: WFL(Within Functional Limits)   Gait velocity interpretation: Below  normal speed for age/gender General Gait Details: demonstrated increased endurance/distance for taking steps while on room air with O2 saturation dropping to 84%, able to recover to 94% while resting   Stairs            Wheelchair Mobility    Modified Rankin (Stroke Patients Only)       Balance Overall balance assessment: No apparent balance deficits (not formally assessed)                                          Cognition Arousal/Alertness: Awake/alert Behavior During Therapy: WFL for tasks assessed/performed Overall Cognitive Status: Within Functional Limits for tasks assessed                                        Exercises      General Comments        Pertinent Vitals/Pain Pain Assessment: No/denies pain    Home Living                      Prior Function            PT Goals (current goals can now be found in the care plan section) Acute Rehab PT Goals Patient Stated Goal: return home PT Goal Formulation: With patient Time For Goal Achievement: 06/04/17 Potential to Achieve  Goals: Good Progress towards PT goals: Progressing toward goals    Frequency    Min 3X/week      PT Plan Current plan remains appropriate    Co-evaluation              AM-PAC PT "6 Clicks" Daily Activity  Outcome Measure  Difficulty turning over in bed (including adjusting bedclothes, sheets and blankets)?: None Difficulty moving from lying on back to sitting on the side of the bed? : None Difficulty sitting down on and standing up from a chair with arms (e.g., wheelchair, bedside commode, etc,.)?: None Help needed moving to and from a bed to chair (including a wheelchair)?: None Help needed walking in hospital room?: None Help needed climbing 3-5 steps with a railing? : A Little 6 Click Score: 23    End of Session   Activity Tolerance: Patient tolerated treatment well;Patient limited by fatigue Patient left: in  chair;with call bell/phone within reach Nurse Communication: Mobility status PT Visit Diagnosis: Unsteadiness on feet (R26.81);Other abnormalities of gait and mobility (R26.89);Muscle weakness (generalized) (M62.81)     Time: 2897-9150 PT Time Calculation (min) (ACUTE ONLY): 18 min  Charges:  $Therapeutic Activity: 8-22 mins                    G Codes:       1:55 PM, 06/23/2017 Lonell Grandchild, MPT Physical Therapist with [facility] [phone] office [phone] mobile phone

## 2017-06-02 NOTE — Progress Notes (Signed)
Subjective: Interval History: has no complaint of difficulty breathing or orthopnea.  Patient also denies any cough..  Objective: Vital signs in last 24 hours: Temp:  [97.9 F (36.6 C)-98.4 F (36.9 C)] 98.2 F (36.8 C) (01/24 0500) Pulse Rate:  [75-92] 83 (01/24 0500) Resp:  [16-20] 20 (01/24 0500) BP: (108-164)/(60-83) 158/73 (01/24 0500) SpO2:  [91 %-99 %] 91 % (01/24 0804) Weight:  [75.6 kg (166 lb 9.6 oz)-80.2 kg (176 lb 12.9 oz)] 75.6 kg (166 lb 9.6 oz) (01/24 0500) Weight change:   Intake/Output from previous day: 01/23 0701 - 01/24 0700 In: 720 [P.O.:720] Out: 2500  Intake/Output this shift: No intake/output data recorded.  General appearance: alert, cooperative and no distress Resp: wheezes bilaterally and posterior - bilateral Cardio: regular rate and rhythm Extremities: No edema  Lab Results: Recent Labs    05/31/17 0542 06/01/17 0521  WBC 2.5* 3.5*  HGB 9.5* 9.5*  HCT 30.0* 29.9*  PLT 129* 143*   BMET:  Recent Labs    05/31/17 0542 06/01/17 0521  NA 136 135  135  K 3.9 4.2  4.2  CL 95* 95*  95*  CO2 27 25  24   GLUCOSE 112* 108*  109*  BUN 36* 50*  50*  CREATININE 6.82* 8.38*  8.48*  CALCIUM 7.6* 7.8*  7.8*   No results for input(s): PTH in the last 72 hours. Iron Studies: No results for input(s): IRON, TIBC, TRANSFERRIN, FERRITIN in the last 72 hours.  Studies/Results: No results found.  I have reviewed the patient's current medications.  Assessment/Plan: 1] difficulty breathing: Possibly multifactorial.  Presently feeling much better. 2] end-stage renal disease: He is status post hemodialysis yesterday.  Patient at this moment does not have any uremic signs and symptoms. 3] anemia: His hemoglobin is below our target goal.  Patient is on Epogen during dialysis. 4] bone and mineral disorder: His calcium and phosphorus is range 5] fluid management: Patient does not have any sign of fluid overload.  We are able to remove about 2-1/2 L  yesterday was dialysis. 6] history of lung cancer patient being followed by oncology.  7] hypertension: His blood pressure is reasonably controlled. 8] possible pneumonia: Patient is afebrile and his white blood cell count is normal.  Presently feeling better. Plan: 1] patient does not need dialysis today his dialysis will be tomorrow which is her regular schedule. 2] if patient is going to be discharged we will go to his regular schedule as an outpatient.  If not we will make arrangement for him to get dialysis here. 3] renal panel in the morning   LOS: 3 days   [name] S 06/02/2017,9:16 AM

## 2017-06-02 NOTE — Care Management Important Message (Signed)
Important Message  Patient Details  Name: [name] MRN: [id number] Date of Birth: 03-01-1931   Medicare Important Message Given:  Yes    Gurpreet Mariani, Chauncey Reading, RN 06/02/2017, 8:17 AM

## 2017-06-02 NOTE — Progress Notes (Addendum)
PROGRESS NOTE    JORGELUIS GURGANUS  TWK:[id number]  DOB: [birth year]  DOA: 05/30/2017 PCP: Sharilyn Sites, MD   Brief Admission Hx: [name] is a 84 y.o. male with end-stage renal disease on hemodialysis who was recently diagnosed with influenza A and treated in the emergency room.  The patient had been taking Tamiflu.  He had hemodialysis today but had to be placed on oxygen because of shortness of breath.  He has had intermittent shortness of breath for the past couple of days.  He denies chest pain.  Otherwise he seemed to tolerate dialysis well.  He tends to get short of breath with ambulating 10-15 feet.  He was evaluated in the emergency department and there was some concern regarding pneumonia.  MDM/Assessment & Plan:   1. Influenza A with superimposed pneumonia -given this patient's advanced comorbidities and age he is at high risk for mortality.  He was admitted for treatment with IV antibiotics and supportive care.  He has completed the course of Tamiflu prescribed in the ED.  He was being treated with ceftriaxone and azithromycin, but has since been transitioned to oral doxycycline.  He is being delivered supplemental oxygen as needed. 2. End-stage renal disease on hemodialysis nephrology following for dialysis needs. 3. Non-small cell lung cancer with possible pulmonary metastasis-he is being followed outpatient with an oncologist and is currently receiving watchful waiting treatment and possibly will have a PET scan later in the month according to records. 4. History of renal cell cancer-he has had no evidence of recurrence at this time, however he is being monitored for suspicious pulmonary nodules by his oncology team. 5. BPH-reported stable. 6. COPD exacerbation.  Likely precipitated by influenza/pneumonia- although he is feeling better today, he still becomes very short of breath on exertion.  Will continue nebulizer treatments and steroids. 7. Essential Hypertension -continue  home medication. 8. GERD - protonix for GI protection.   DVT Prophylaxis: heparin Code Status: Full   Family Communication:  Discussed with niece at the bedside Disposition Plan: home when respiratory status is better  Consultants: nephrology  Subjective: Feeling better today.  Wheezing is improving.  Cough is better.  Able to ambulate more.  Objective: Vitals:   06/01/17 2245 06/02/17 0500 06/02/17 0804 06/02/17 1300  BP: 118/71 (!) 158/73  128/67  Pulse: 87 83  89  Resp: 16 20  20   Temp: 97.9 F (36.6 C) 98.2 F (36.8 C)  98.3 F (36.8 C)  TempSrc: Oral   Oral  SpO2: 99% 98% 91% 94%  Weight: 77.8 kg (171 lb 8.3 oz) 75.6 kg (166 lb 9.6 oz)    Height:        Intake/Output Summary (Last 24 hours) at 06/02/2017 1331 Last data filed at 06/02/2017 1300 Gross per 24 hour  Intake 720 ml  Output 3050 ml  Net -2330 ml   Filed Weights   06/01/17 1825 06/01/17 2245 06/02/17 0500  Weight: 80.2 kg (176 lb 12.9 oz) 77.8 kg (171 lb 8.3 oz) 75.6 kg (166 lb 9.6 oz)     REVIEW OF SYSTEMS  As per history otherwise all reviewed and reported negative  Exam:  General exam: Awake, alert, no distress, cooperative and pleasant, sitting up in the bed. Respiratory system: Diminished breath sounds, wheezing has improved Cardiovascular system: S1 & S2 heard. Gastrointestinal system: Abdomen is nondistended, soft and nontender. Normal bowel sounds heard. Central nervous system: Alert and oriented. No focal neurological deficits. Extremities: no CCE.  Data Reviewed: Basic  Metabolic Panel: Recent Labs  Lab 05/30/17 1105 05/31/17 0542 06/01/17 0521  NA 136 136 135  135  K 3.7 3.9 4.2  4.2  CL 94* 95* 95*  95*  CO2 29 27 25  24   GLUCOSE 91 112* 108*  109*  BUN 25* 36* 50*  50*  CREATININE 4.62* 6.82* 8.38*  8.48*  CALCIUM 8.0* 7.6* 7.8*  7.8*  PHOS  --   --  4.3   Liver Function Tests: Recent Labs  Lab 06/01/17 0521  ALBUMIN 2.9*   No results for input(s): LIPASE,  AMYLASE in the last 168 hours. No results for input(s): AMMONIA in the last 168 hours. CBC: Recent Labs  Lab 05/30/17 1105 05/31/17 0542 06/01/17 0521  WBC 4.5 2.5* 3.5*  NEUTROABS 3.4 1.6*  --   HGB 10.6* 9.5* 9.5*  HCT 33.0* 30.0* 29.9*  MCV 99.1 100.3* 100.0  PLT 150 129* 143*   Cardiac Enzymes: Recent Labs  Lab 05/30/17 1105  TROPONINI 0.03*   CBG (last 3)  No results for input(s): GLUCAP in the last 72 hours. Recent Results (from the past 240 hour(s))  Culture, blood (routine x 2)     Status: None   Collection Time: 05/26/17 11:16 AM  Result Value Ref Range Status   Specimen Description   Final    BLOOD RIGHT WRIST BOTTLES DRAWN AEROBIC AND ANAEROBIC   Special Requests   Final    Blood Culture results may not be optimal due to an inadequate volume of blood received in culture bottles   Culture NO GROWTH 5 DAYS  Final   Report Status 05/31/2017 FINAL  Final  Culture, blood (routine x 2)     Status: None   Collection Time: 05/26/17 11:16 AM  Result Value Ref Range Status   Specimen Description   Final    BLOOD RIGHT ARM BOTTLES DRAWN AEROBIC AND ANAEROBIC   Special Requests Blood Culture adequate volume  Final   Culture NO GROWTH 5 DAYS  Final   Report Status 05/31/2017 FINAL  Final  Culture, sputum-assessment     Status: None   Collection Time: 05/30/17  2:06 PM  Result Value Ref Range Status   Specimen Description EXPECTORATED SPUTUM  Final   Special Requests NONE  Final   Sputum evaluation THIS SPECIMEN IS ACCEPTABLE FOR SPUTUM CULTURE  Final   Report Status 05/30/2017 FINAL  Final  MRSA PCR Screening     Status: None   Collection Time: 05/30/17  2:55 PM  Result Value Ref Range Status   MRSA by PCR NEGATIVE NEGATIVE Final    Comment:        The GeneXpert MRSA Assay (FDA approved for NASAL specimens only), is one component of a comprehensive MRSA colonization surveillance program. It is not intended to diagnose MRSA infection nor to guide or monitor  treatment for MRSA infections.      Studies: No results found. Scheduled Meds: . cinacalcet  30 mg Oral Q supper  . doxycycline  100 mg Oral Q12H  . guaiFENesin  1,200 mg Oral BID  . heparin  5,000 Units Subcutaneous Q8H  . ipratropium-albuterol  3 mL Nebulization TID  . methylPREDNISolone (SOLU-MEDROL) injection  40 mg Intravenous Q12H  . metoprolol succinate  25 mg Oral q morning - 10a  . pantoprazole  40 mg Oral Q0600  . sevelamer carbonate  1,600 mg Oral TID WC  . sevelamer carbonate  800 mg Oral With snacks   Continuous Infusions: . sodium chloride    .  sodium chloride      Active Problems:   End stage renal disease (HCC)   Non-small cell cancer of left lung (HCC)   Pulmonary nodules/lesions, multiple   Community acquired pneumonia   Influenza A   BPH (benign prostatic hypertrophy)   COPD (chronic obstructive pulmonary disease) (HCC)   Hypertension   GERD (gastroesophageal reflux disease)   Time spent: 95mins  Kathie Dike, MD [facility] Pager [phone]  If 7PM-7AM, please contact night-coverage [email or web address] Password [email or web address] 06/02/2017, 1:31 PM    LOS: 3 days

## 2017-06-03 DIAGNOSIS — I10 Essential (primary) hypertension: Secondary | ICD-10-CM

## 2017-06-03 DIAGNOSIS — J101 Influenza due to other identified influenza virus with other respiratory manifestations: Secondary | ICD-10-CM

## 2017-06-03 LAB — RENAL FUNCTION PANEL
Albumin: 3.2 g/dL — ABNORMAL LOW (ref 3.5–5.0)
Anion gap: 19 — ABNORMAL HIGH (ref 5–15)
BUN: 65 mg/dL — ABNORMAL HIGH (ref 6–20)
CHLORIDE: 94 mmol/L — AB (ref 101–111)
CO2: 21 mmol/L — ABNORMAL LOW (ref 22–32)
Calcium: 8.1 mg/dL — ABNORMAL LOW (ref 8.9–10.3)
Creatinine, Ser: 8.07 mg/dL — ABNORMAL HIGH (ref 0.61–1.24)
GFR calc Af Amer: 6 mL/min — ABNORMAL LOW (ref >60)
GFR, EST NON AFRICAN AMERICAN: 5 mL/min — AB (ref >60)
Glucose, Bld: 147 mg/dL — ABNORMAL HIGH (ref 65–99)
POTASSIUM: 5 mmol/L (ref 3.5–5.1)
Phosphorus: 4.8 mg/dL — ABNORMAL HIGH (ref 2.5–4.6)
Sodium: 134 mmol/L — ABNORMAL LOW (ref 135–145)

## 2017-06-03 LAB — CBC
HEMATOCRIT: 32.9 % — AB (ref 39.0–52.0)
Hemoglobin: 10.5 g/dL — ABNORMAL LOW (ref 13.0–17.0)
MCH: 31.6 pg (ref 26.0–34.0)
MCHC: 31.9 g/dL (ref 30.0–36.0)
MCV: 99.1 fL (ref 78.0–100.0)
Platelets: 214 10*3/uL (ref 150–400)
RBC: 3.32 MIL/uL — AB (ref 4.22–5.81)
RDW: 13.2 % (ref 11.5–15.5)
WBC: 7.8 10*3/uL (ref 4.0–10.5)

## 2017-06-03 MED ORDER — GUAIFENESIN ER 600 MG PO TB12: 600.0000 mg | ORAL_TABLET | Freq: Two times a day (BID) | ORAL | 0 refills | Status: DC

## 2017-06-03 MED ORDER — PREDNISONE 10 MG PO TABS: ORAL_TABLET | ORAL | 0 refills | Status: DC

## 2017-06-03 MED ORDER — ALBUTEROL SULFATE (2.5 MG/3ML) 0.083% IN NEBU: 2.5000 mg | INHALATION_SOLUTION | Freq: Four times a day (QID) | RESPIRATORY_TRACT | 12 refills | Status: DC | PRN

## 2017-06-03 MED ORDER — PANTOPRAZOLE SODIUM 40 MG PO TBEC: 40.0000 mg | DELAYED_RELEASE_TABLET | Freq: Every day | ORAL | 0 refills | Status: DC

## 2017-06-03 MED ORDER — DOXYCYCLINE HYCLATE 100 MG PO TABS: 100.0000 mg | ORAL_TABLET | Freq: Two times a day (BID) | ORAL | 0 refills | Status: DC

## 2017-06-03 NOTE — Progress Notes (Addendum)
Subjective: Interval History: Patient is presently on dialysis.  He offers no complaints.  He denies any more difficulty breathing and no cough or fever.  Objective: Vital signs in last 24 hours: Temp:  [97.5 F (36.4 C)-98.4 F (36.9 C)] 98.1 F (36.7 C) (01/25 0520) Pulse Rate:  [62-98] 98 (01/25 0900) Resp:  [16-20] 16 (01/25 0520) BP: (103-165)/(55-74) 107/67 (01/25 0900) SpO2:  [90 %-96 %] 90 % (01/25 0820) Weight:  [159 lb 9.8 oz (72.4 kg)-159 lb 11.2 oz (72.4 kg)] 159 lb 9.8 oz (72.4 kg) (01/25 0520) Weight change: -1.7 oz (-7.76 kg)  Intake/Output from previous day: 01/24 0701 - 01/25 0700 In: 720 [P.O.:720] Out: 1050 [Urine:1050] Intake/Output this shift: Total I/O In: -  Out: 2000 [Other:2000]  General appearance: alert, cooperative and no distress Resp: wheezes bilaterally and posterior - bilateral Cardio: regular rate and rhythm Extremities: No edema  Lab Results: Recent Labs    06/01/17 0521 06/03/17 0517  WBC 3.5* 7.8  HGB 9.5* 10.5*  HCT 29.9* 32.9*  PLT 143* 214   BMET:  Recent Labs    06/01/17 0521 06/03/17 0517  NA 135  135 134*  K 4.2  4.2 5.0  CL 95*  95* 94*  CO2 25  24 21*  GLUCOSE 108*  109* 147*  BUN 50*  50* 65*  CREATININE 8.38*  8.48* 8.07*  CALCIUM 7.8*  7.8* 8.1*   No results for input(s): PTH in the last 72 hours. Iron Studies: No results for input(s): IRON, TIBC, TRANSFERRIN, FERRITIN in the last 72 hours.  Studies/Results: No results found.  I have reviewed the patient's current medications.  Assessment/Plan: 1] difficulty breathing: Progressively improving. 2] end-stage renal disease: Patient is presently on dialysis.  His potassium is normal. 3] anemia: His hemoglobin has come up to a target goal.  Patient received Epogen during dialysis. 4] bone and mineral disorder: His calcium and phosphorus is range 5] fluid management: Patient does not have any sign of fluid overload. 6] history of lung cancer patient  being followed by oncology.  7] hypertension: His blood pressure is reasonably controlled. 8]  pneumonia: Patient with previous history of influenza A and presently came with difficult breathing.  He was found to have bilateral infiltrate consistent with pneumonia.  Patient presently is feeling much better. Plan: 1] we will continue his dialysis and will attempt to remove about 2 L if blood pressure tolerates. 2] his next dialysis would be on Monday which is his regular schedule.   LOS: 4 days   Erickson Yamashiro S 06/03/2017,9:16 AM

## 2017-06-03 NOTE — Discharge Summary (Signed)
Physician Discharge Summary  [name] CWC:[id number] DOB: Sep 18, 1930 DOA: 05/30/2017  PCP: Sharilyn Sites, MD  Admit date: 05/30/2017 Discharge date: 06/03/2017  Admitted From: home Disposition:  home  Recommendations for Outpatient Follow-up:  1. Follow up with PCP in 1-2 weeks 2. Please obtain BMP/CBC in one week 3. Follow up at outpatient dialysis center as scheduled 4. Repeat chest xray in 3-4 weeks to ensure resolution of pneumonia   Home Health: home health PT Equipment/Devices:neb  Discharge Condition: stable CODE STATUS:full code Diet recommendation: Heart Healthy    Brief/Interim Summary: [name] Crossis a 84 y.o.malewith end-stage renal disease on hemodialysis who was recently diagnosed with influenza A and treated in the emergency room. The patient had been taking Tamiflu.He had hemodialysis today but had to be placed on oxygen because of shortness of breath. He has had intermittent shortness of breath for the past couple of days. He denies chest pain. Otherwise he seemed to tolerate dialysis well. He tends to get short of breath with ambulating 10-15 feet. He was evaluated in the emergency department and there was some concern regarding pneumonia.    Discharge Diagnoses:  Active Problems:   End stage renal disease (HCC)   Non-small cell cancer of left lung (HCC)   Pulmonary nodules/lesions, multiple   Community acquired pneumonia   Influenza A   BPH (benign prostatic hypertrophy)   COPD (chronic obstructive pulmonary disease) (HCC)   Hypertension   GERD (gastroesophageal reflux disease)  1. Influenza A with superimposed pneumonia -given this patient's advanced comorbidities and age he is at high risk for mortality. He was admitted for treatment with IV antibiotics and supportive care. He has completed the course of Tamiflu prescribed in the ED. He was being treated with ceftriaxone and azithromycin, but has since been transitioned to oral  doxycycline.  Oxygen has been weaned off and is breathing comfortably on room air.  Any repeat chest x-ray in 3-4 weeks to ensure resolution. 2. End-stage renal disease on hemodialysis nephrology following for dialysis needs. 3. Non-small cell lung cancer with possible pulmonary metastasis-he is being followed outpatient with an oncologist and is currently receiving watchful waiting treatment and possibly will have a PET scan later in the month according to records. 4. History of renal cell cancer-he has had no evidence of recurrence at this time, however he is being monitored for suspicious pulmonary nodules by his oncology team. 5. BPH-reported stable. 6. COPD exacerbation.  Likely precipitated by influenza/pneumonia-  treated with bronchodilators, antibiotics and steroids.  Appears to be improving.  Started on prednisone taper.  Will be discharged with nebulizer machine. 7. Essential Hypertension -continue home medication. 8. GERD - protonix for GI protection     Discharge Instructions  Discharge Instructions    DME Nebulizer machine   Complete by:  As directed    Patient needs a nebulizer to treat with the following condition:  COPD exacerbation ([place])   Diet - low sodium heart healthy   Complete by:  As directed    Increase activity slowly   Complete by:  As directed      Allergies as of 06/03/2017   No Known Allergies     Medication List    STOP taking these medications   azithromycin 250 MG tablet Commonly known as:  ZITHROMAX   oseltamivir 75 MG capsule Commonly known as:  TAMIFLU     TAKE these medications   albuterol 108 (90 Base) MCG/ACT inhaler Commonly known as:  PROVENTIL HFA;VENTOLIN HFA Inhale 1-2  puffs into the lungs every 6 (six) hours as needed for wheezing or shortness of breath. What changed:  Another medication with the same name was added. Make sure you understand how and when to take each.   albuterol (2.5 MG/3ML) 0.083% nebulizer solution Commonly  known as:  PROVENTIL Take 3 mLs (2.5 mg total) by nebulization every 6 (six) hours as needed for wheezing or shortness of breath. What changed:  You were already taking a medication with the same name, and this prescription was added. Make sure you understand how and when to take each.   cinacalcet 30 MG tablet Commonly known as:  SENSIPAR Take 30 mg by mouth daily with supper.   clobetasol cream 0.05 % Commonly known as:  TEMOVATE Apply 1 application topically 2 (two) times daily as needed (itchy ears).   doxycycline 100 MG tablet Commonly known as:  VIBRA-TABS Take 1 tablet (100 mg total) by mouth every 12 (twelve) hours.   guaiFENesin 600 MG 12 hr tablet Commonly known as:  MUCINEX Take 1 tablet (600 mg total) by mouth 2 (two) times daily.   lidocaine-prilocaine cream Commonly known as:  EMLA Apply 1 application topically as directed. Apply to access site for dialysis 1 hour prior to visit   metoprolol succinate 25 MG 24 hr tablet Commonly known as:  TOPROL-XL Take 25 mg by mouth every morning.   pantoprazole 40 MG tablet Commonly known as:  PROTONIX Take 1 tablet (40 mg total) by mouth daily at 6 (six) AM. Start taking on:  06/04/2017   predniSONE 10 MG tablet Commonly known as:  DELTASONE Take 40mg  po daily for 2 days then 30mg  daily for 2 days then 20mg  daily for 2 days then 10mg  daily for 2 days then stop   sevelamer carbonate 800 MG tablet Commonly known as:  RENVELA Take 800-1,600 mg by mouth as directed. Take 2 tablets (1600mg ) three times a day with meals and 1 tablet (800mg ) with a snack            Durable Medical Equipment  (From admission, onward)        Start     Ordered   06/03/17 0000  DME Nebulizer machine    Question:  Patient needs a nebulizer to treat with the following condition  Answer:  COPD exacerbation ([place])   06/03/17 1139      No Known Allergies  Consultations:  Nephrology   Procedures/Studies: Dg Chest 2 View  Result  Date: 05/30/2017 CLINICAL DATA:  Patient has been treated for the flu last Thursday. EXAM: CHEST  2 VIEW COMPARISON:  May 26, 2017 FINDINGS: The heart size and mediastinal contours are stable. Mild patchy opacities identified bilateral lung bases. Small pulmonary nodules identified around base unchanged. Old posttraumatic changes of left ribs are identified unchanged. IMPRESSION: Mild opacities identified in bilateral lung bases, developing pneumonia is not excluded. Electronically Signed   By: Abelardo Diesel M.D.   On: 05/30/2017 11:13   Dg Chest 2 View  Result Date: 05/26/2017 CLINICAL DATA:  Shortness of breath. EXAM: CHEST  2 VIEW COMPARISON:  12/14/2016 chest CT and 07/12/2012 radiographs FINDINGS: The cardiac silhouette is normal in size. Thoracic aortic atherosclerosis and tortuosity are noted. Sequelae left upper lobectomy are again identified. Underlying emphysema is again noted. Scattered small lung nodules are present, better demonstrated on CT. No acute airspace consolidation, edema, pleural effusion, or pneumothorax is identified. No acute osseous abnormality is seen. IMPRESSION: 1. No evidence of acute cardiopulmonary process. 2.  Emphysema, postsurgical changes, and small lung nodules as previously assessed by CT. Electronically Signed   By: Logan Bores M.D.   On: 05/26/2017 10:59       Subjective: Feeling better.  Cough is nonproductive.  Feels that respiratory status is approaching baseline.  Discharge Exam: Vitals:   06/03/17 0915 06/03/17 1046  BP: 102/62 (!) 111/46  Pulse: 90 (!) 102  Resp: 18   Temp: 98 F (36.7 C)   SpO2: 93%    Vitals:   06/03/17 0830 06/03/17 0900 06/03/17 0915 06/03/17 1046  BP: 103/64 107/67 102/62 (!) 111/46  Pulse: 92 98 90 (!) 102  Resp:   18   Temp:   98 F (36.7 C)   TempSrc:   Oral   SpO2:   93%   Weight:   71.3 kg (157 lb 3 oz)   Height:        General: Pt is alert, awake, not in acute distress Cardiovascular: RRR, S1/S2 +, no  rubs, no gallops Respiratory: Diminished breath sounds bilaterally Abdominal: Soft, NT, ND, bowel sounds + Extremities: no edema, no cyanosis    The results of significant diagnostics from this hospitalization (including imaging, microbiology, ancillary and laboratory) are listed below for reference.     Microbiology: Recent Results (from the past 240 hour(s))  Culture, blood (routine x 2)     Status: None   Collection Time: 05/26/17 11:16 AM  Result Value Ref Range Status   Specimen Description   Final    BLOOD RIGHT WRIST BOTTLES DRAWN AEROBIC AND ANAEROBIC   Special Requests   Final    Blood Culture results may not be optimal due to an inadequate volume of blood received in culture bottles   Culture NO GROWTH 5 DAYS  Final   Report Status 05/31/2017 FINAL  Final  Culture, blood (routine x 2)     Status: None   Collection Time: 05/26/17 11:16 AM  Result Value Ref Range Status   Specimen Description   Final    BLOOD RIGHT ARM BOTTLES DRAWN AEROBIC AND ANAEROBIC   Special Requests Blood Culture adequate volume  Final   Culture NO GROWTH 5 DAYS  Final   Report Status 05/31/2017 FINAL  Final  Culture, sputum-assessment     Status: None   Collection Time: 05/30/17  2:06 PM  Result Value Ref Range Status   Specimen Description EXPECTORATED SPUTUM  Final   Special Requests NONE  Final   Sputum evaluation THIS SPECIMEN IS ACCEPTABLE FOR SPUTUM CULTURE  Final   Report Status 05/30/2017 FINAL  Final  MRSA PCR Screening     Status: None   Collection Time: 05/30/17  2:55 PM  Result Value Ref Range Status   MRSA by PCR NEGATIVE NEGATIVE Final    Comment:        The GeneXpert MRSA Assay (FDA approved for NASAL specimens only), is one component of a comprehensive MRSA colonization surveillance program. It is not intended to diagnose MRSA infection nor to guide or monitor treatment for MRSA infections.      Labs: BNP (last 3 results) Recent Labs    05/30/17 1105  BNP  742.5*   Basic Metabolic Panel: Recent Labs  Lab 05/30/17 1105 05/31/17 0542 06/01/17 0521 06/03/17 0517  NA 136 136 135  135 134*  K 3.7 3.9 4.2  4.2 5.0  CL 94* 95* 95*  95* 94*  CO2 29 27 25  24  21*  GLUCOSE 91 112* 108*  109* 147*  BUN 25* 36* 50*  50* 65*  CREATININE 4.62* 6.82* 8.38*  8.48* 8.07*  CALCIUM 8.0* 7.6* 7.8*  7.8* 8.1*  PHOS  --   --  4.3 4.8*   Liver Function Tests: Recent Labs  Lab 06/01/17 0521 06/03/17 0517  ALBUMIN 2.9* 3.2*   No results for input(s): LIPASE, AMYLASE in the last 168 hours. No results for input(s): AMMONIA in the last 168 hours. CBC: Recent Labs  Lab 05/30/17 1105 05/31/17 0542 06/01/17 0521 06/03/17 0517  WBC 4.5 2.5* 3.5* 7.8  NEUTROABS 3.4 1.6*  --   --   HGB 10.6* 9.5* 9.5* 10.5*  HCT 33.0* 30.0* 29.9* 32.9*  MCV 99.1 100.3* 100.0 99.1  PLT 150 129* 143* 214   Cardiac Enzymes: Recent Labs  Lab 05/30/17 1105  TROPONINI 0.03*   BNP: Invalid input(s): POCBNP CBG: No results for input(s): GLUCAP in the last 168 hours. D-Dimer No results for input(s): DDIMER in the last 72 hours. Hgb A1c No results for input(s): HGBA1C in the last 72 hours. Lipid Profile No results for input(s): CHOL, HDL, LDLCALC, TRIG, CHOLHDL, LDLDIRECT in the last 72 hours. Thyroid function studies No results for input(s): TSH, T4TOTAL, T3FREE, THYROIDAB in the last 72 hours.  Invalid input(s): FREET3 Anemia work up No results for input(s): VITAMINB12, FOLATE, FERRITIN, TIBC, IRON, RETICCTPCT in the last 72 hours. Urinalysis    Component Value Date/Time   COLORURINE YELLOW 01/01/2009 1054   APPEARANCEUR CLEAR 01/01/2009 1054   LABSPEC 1.018 01/01/2009 1054   PHURINE 5.0 01/01/2009 1054   GLUCOSEU NEGATIVE 01/01/2009 1054   HGBUR NEGATIVE 01/01/2009 1054   [place] 01/01/2009 1054   KETONESUR NEGATIVE 01/01/2009 1054   PROTEINUR 100 (A) 01/01/2009 1054   UROBILINOGEN 0.2 01/01/2009 1054   NITRITE NEGATIVE  01/01/2009 1054   LEUKOCYTESUR NEGATIVE 01/01/2009 1054   Sepsis Labs Invalid input(s): PROCALCITONIN,  WBC,  LACTICIDVEN Microbiology Recent Results (from the past 240 hour(s))  Culture, blood (routine x 2)     Status: None   Collection Time: 05/26/17 11:16 AM  Result Value Ref Range Status   Specimen Description   Final    BLOOD RIGHT WRIST BOTTLES DRAWN AEROBIC AND ANAEROBIC   Special Requests   Final    Blood Culture results may not be optimal due to an inadequate volume of blood received in culture bottles   Culture NO GROWTH 5 DAYS  Final   Report Status 05/31/2017 FINAL  Final  Culture, blood (routine x 2)     Status: None   Collection Time: 05/26/17 11:16 AM  Result Value Ref Range Status   Specimen Description   Final    BLOOD RIGHT ARM BOTTLES DRAWN AEROBIC AND ANAEROBIC   Special Requests Blood Culture adequate volume  Final   Culture NO GROWTH 5 DAYS  Final   Report Status 05/31/2017 FINAL  Final  Culture, sputum-assessment     Status: None   Collection Time: 05/30/17  2:06 PM  Result Value Ref Range Status   Specimen Description EXPECTORATED SPUTUM  Final   Special Requests NONE  Final   Sputum evaluation THIS SPECIMEN IS ACCEPTABLE FOR SPUTUM CULTURE  Final   Report Status 05/30/2017 FINAL  Final  MRSA PCR Screening     Status: None   Collection Time: 05/30/17  2:55 PM  Result Value Ref Range Status   MRSA by PCR NEGATIVE NEGATIVE Final    Comment:        The GeneXpert MRSA Assay (FDA approved  for NASAL specimens only), is one component of a comprehensive MRSA colonization surveillance program. It is not intended to diagnose MRSA infection nor to guide or monitor treatment for MRSA infections.      Time coordinating discharge: Over 30 minutes  SIGNED:   Kathie Dike, MD  [facility] 06/03/2017, 11:41 AM Pager   If 7PM-7AM, please contact night-coverage [email or web address] Password [email or web address]

## 2017-06-03 NOTE — Procedures (Signed)
     HEMODIALYSIS TREATMENT NOTE:   3.5 hour heparin-free dialysis completed via left upper arm AVF (16g/antegrade). Goal met: 2 liters removed without interruption in ultrafiltration.  All blood was returned and hemostasis was achieved within 15 minutes. Report given to Sharyn Blitz, RN.  Rockwell Alexandria, RN, CDN

## 2017-06-03 NOTE — Progress Notes (Signed)
SATURATION QUALIFICATIONS: (This note is used to comply with regulatory documentation for home oxygen)  Patient Saturations on Room Air at Rest = 95%  Patient Saturations on Room Air while Ambulating = 94%  Patient Saturations on  Liters of oxygen while Ambulating = N/A%  Please briefly explain why patient needs home oxygen:

## 2017-06-03 NOTE — Care Management Note (Signed)
Case Management Note  Patient Details  Name: [name] MRN: [id number] Date of Birth: 09-20-1930    Expected Discharge Date:  06/03/17               Expected Discharge Plan:  Home/Self Care  In-House Referral:     Discharge planning Services  CM Consult  Post Acute Care Choice:  Home Health Choice offered to:  Patient  DME Arranged:  Nebulizer machine DME Agency:     HH Arranged:  Patient Refused [place] Agency:     Status of Service:  Completed, signed off  If discussed at [employer] of Stay Meetings, dates discussed:    Additional Comments: Patient discharging home today. Refusing HH as ordered by MD. He is aware that his PCP can order [facility] if needed. Will need a Neb machine. Juliann Pulse of [facility] will deliver to room prior to discharge. No other CM needs.  Temprence Rhines, Chauncey Reading, RN 06/03/2017, 12:47 PM

## 2017-06-03 NOTE — Progress Notes (Signed)
Pt discharged home today per Dr. Roderic Palau. Pt's IV site D/C'd and WDL. Pt's VSS. Pt provided with home medication list, discharge instructions and prescriptions. Verbalized understanding. Pt currently awaiting delivery of nebulizer machine. Once delivered, patient is stable for discharge.

## 2017-06-06 DIAGNOSIS — N186 End stage renal disease: Secondary | ICD-10-CM | POA: Diagnosis not present

## 2017-06-06 DIAGNOSIS — Z992 Dependence on renal dialysis: Secondary | ICD-10-CM | POA: Diagnosis not present

## 2017-06-06 DIAGNOSIS — D631 Anemia in chronic kidney disease: Secondary | ICD-10-CM | POA: Diagnosis not present

## 2017-06-06 DIAGNOSIS — D509 Iron deficiency anemia, unspecified: Secondary | ICD-10-CM | POA: Diagnosis not present

## 2017-06-06 DIAGNOSIS — N2581 Secondary hyperparathyroidism of renal origin: Secondary | ICD-10-CM | POA: Diagnosis not present

## 2017-06-07 DIAGNOSIS — Z85528 Personal history of other malignant neoplasm of kidney: Secondary | ICD-10-CM | POA: Diagnosis not present

## 2017-06-07 DIAGNOSIS — Z85118 Personal history of other malignant neoplasm of bronchus and lung: Secondary | ICD-10-CM | POA: Diagnosis not present

## 2017-06-07 DIAGNOSIS — Z905 Acquired absence of kidney: Secondary | ICD-10-CM | POA: Diagnosis not present

## 2017-06-07 DIAGNOSIS — Z08 Encounter for follow-up examination after completed treatment for malignant neoplasm: Secondary | ICD-10-CM | POA: Diagnosis not present

## 2017-06-07 DIAGNOSIS — R918 Other nonspecific abnormal finding of lung field: Secondary | ICD-10-CM | POA: Diagnosis not present

## 2017-06-07 DIAGNOSIS — D472 Monoclonal gammopathy: Secondary | ICD-10-CM | POA: Diagnosis not present

## 2017-06-07 DIAGNOSIS — C641 Malignant neoplasm of right kidney, except renal pelvis: Secondary | ICD-10-CM | POA: Diagnosis not present

## 2017-06-07 DIAGNOSIS — Z902 Acquired absence of lung [part of]: Secondary | ICD-10-CM | POA: Diagnosis not present

## 2017-06-07 DIAGNOSIS — C3412 Malignant neoplasm of upper lobe, left bronchus or lung: Secondary | ICD-10-CM | POA: Diagnosis not present

## 2017-06-08 DIAGNOSIS — J111 Influenza due to unidentified influenza virus with other respiratory manifestations: Secondary | ICD-10-CM | POA: Diagnosis not present

## 2017-06-08 DIAGNOSIS — E663 Overweight: Secondary | ICD-10-CM | POA: Diagnosis not present

## 2017-06-08 DIAGNOSIS — Z6826 Body mass index (BMI) 26.0-26.9, adult: Secondary | ICD-10-CM | POA: Diagnosis not present

## 2017-06-08 DIAGNOSIS — Z992 Dependence on renal dialysis: Secondary | ICD-10-CM | POA: Diagnosis not present

## 2017-06-08 DIAGNOSIS — D631 Anemia in chronic kidney disease: Secondary | ICD-10-CM | POA: Diagnosis not present

## 2017-06-08 DIAGNOSIS — J189 Pneumonia, unspecified organism: Secondary | ICD-10-CM | POA: Diagnosis not present

## 2017-06-08 DIAGNOSIS — D509 Iron deficiency anemia, unspecified: Secondary | ICD-10-CM | POA: Diagnosis not present

## 2017-06-08 DIAGNOSIS — N186 End stage renal disease: Secondary | ICD-10-CM | POA: Diagnosis not present

## 2017-06-08 DIAGNOSIS — N2581 Secondary hyperparathyroidism of renal origin: Secondary | ICD-10-CM | POA: Diagnosis not present

## 2017-06-09 DIAGNOSIS — Z992 Dependence on renal dialysis: Secondary | ICD-10-CM | POA: Diagnosis not present

## 2017-06-09 DIAGNOSIS — N186 End stage renal disease: Secondary | ICD-10-CM | POA: Diagnosis not present

## 2017-06-10 DIAGNOSIS — D631 Anemia in chronic kidney disease: Secondary | ICD-10-CM | POA: Diagnosis not present

## 2017-06-10 DIAGNOSIS — N186 End stage renal disease: Secondary | ICD-10-CM | POA: Diagnosis not present

## 2017-06-10 DIAGNOSIS — D509 Iron deficiency anemia, unspecified: Secondary | ICD-10-CM | POA: Diagnosis not present

## 2017-06-10 DIAGNOSIS — N2581 Secondary hyperparathyroidism of renal origin: Secondary | ICD-10-CM | POA: Diagnosis not present

## 2017-06-10 DIAGNOSIS — Z992 Dependence on renal dialysis: Secondary | ICD-10-CM | POA: Diagnosis not present

## 2017-06-13 DIAGNOSIS — N186 End stage renal disease: Secondary | ICD-10-CM | POA: Diagnosis not present

## 2017-06-13 DIAGNOSIS — N2581 Secondary hyperparathyroidism of renal origin: Secondary | ICD-10-CM | POA: Diagnosis not present

## 2017-06-13 DIAGNOSIS — Z992 Dependence on renal dialysis: Secondary | ICD-10-CM | POA: Diagnosis not present

## 2017-06-13 DIAGNOSIS — D631 Anemia in chronic kidney disease: Secondary | ICD-10-CM | POA: Diagnosis not present

## 2017-06-13 DIAGNOSIS — D509 Iron deficiency anemia, unspecified: Secondary | ICD-10-CM | POA: Diagnosis not present

## 2017-06-15 DIAGNOSIS — D509 Iron deficiency anemia, unspecified: Secondary | ICD-10-CM | POA: Diagnosis not present

## 2017-06-15 DIAGNOSIS — D631 Anemia in chronic kidney disease: Secondary | ICD-10-CM | POA: Diagnosis not present

## 2017-06-15 DIAGNOSIS — Z992 Dependence on renal dialysis: Secondary | ICD-10-CM | POA: Diagnosis not present

## 2017-06-15 DIAGNOSIS — N186 End stage renal disease: Secondary | ICD-10-CM | POA: Diagnosis not present

## 2017-06-15 DIAGNOSIS — N2581 Secondary hyperparathyroidism of renal origin: Secondary | ICD-10-CM | POA: Diagnosis not present

## 2017-06-17 DIAGNOSIS — N186 End stage renal disease: Secondary | ICD-10-CM | POA: Diagnosis not present

## 2017-06-17 DIAGNOSIS — Z992 Dependence on renal dialysis: Secondary | ICD-10-CM | POA: Diagnosis not present

## 2017-06-17 DIAGNOSIS — D509 Iron deficiency anemia, unspecified: Secondary | ICD-10-CM | POA: Diagnosis not present

## 2017-06-17 DIAGNOSIS — N2581 Secondary hyperparathyroidism of renal origin: Secondary | ICD-10-CM | POA: Diagnosis not present

## 2017-06-17 DIAGNOSIS — D631 Anemia in chronic kidney disease: Secondary | ICD-10-CM | POA: Diagnosis not present

## 2017-06-19 ENCOUNTER — Other Ambulatory Visit: Payer: Self-pay

## 2017-06-19 ENCOUNTER — Emergency Department (HOSPITAL_COMMUNITY)
Admission: EM | Admit: 2017-06-19 | Discharge: 2017-06-19 | Disposition: A | Discharge status: Home / Self Care | Payer: Medicare Other | Attending: Emergency Medicine | Admitting: Emergency Medicine

## 2017-06-19 ENCOUNTER — Encounter (HOSPITAL_COMMUNITY): Payer: Self-pay | Admitting: Emergency Medicine

## 2017-06-19 DIAGNOSIS — H1032 Unspecified acute conjunctivitis, left eye: Secondary | ICD-10-CM | POA: Diagnosis not present

## 2017-06-19 DIAGNOSIS — H5712 Ocular pain, left eye: Secondary | ICD-10-CM | POA: Diagnosis present

## 2017-06-19 DIAGNOSIS — I120 Hypertensive chronic kidney disease with stage 5 chronic kidney disease or end stage renal disease: Secondary | ICD-10-CM | POA: Diagnosis not present

## 2017-06-19 DIAGNOSIS — H40059 Ocular hypertension, unspecified eye: Secondary | ICD-10-CM | POA: Diagnosis not present

## 2017-06-19 DIAGNOSIS — Z87891 Personal history of nicotine dependence: Secondary | ICD-10-CM | POA: Diagnosis not present

## 2017-06-19 DIAGNOSIS — N186 End stage renal disease: Secondary | ICD-10-CM | POA: Insufficient documentation

## 2017-06-19 DIAGNOSIS — Z79899 Other long term (current) drug therapy: Secondary | ICD-10-CM | POA: Insufficient documentation

## 2017-06-19 DIAGNOSIS — R05 Cough: Secondary | ICD-10-CM | POA: Diagnosis not present

## 2017-06-19 DIAGNOSIS — Z992 Dependence on renal dialysis: Secondary | ICD-10-CM | POA: Diagnosis not present

## 2017-06-19 DIAGNOSIS — J449 Chronic obstructive pulmonary disease, unspecified: Secondary | ICD-10-CM | POA: Insufficient documentation

## 2017-06-19 DIAGNOSIS — H40052 Ocular hypertension, left eye: Secondary | ICD-10-CM

## 2017-06-19 HISTORY — DX: Pneumonia, unspecified organism: J18.9

## 2017-06-19 MED ORDER — KETOROLAC TROMETHAMINE 0.5 % OP SOLN
1.0000 [drp] | Freq: Once | OPHTHALMIC | Status: AC
  Administered 2017-06-19: 1 [drp] via OPHTHALMIC
  Filled 2017-06-19: qty 5

## 2017-06-19 MED ORDER — TETRACAINE HCL 0.5 % OP SOLN
2.0000 [drp] | Freq: Once | OPHTHALMIC | Status: DC
  Filled 2017-06-19: qty 4

## 2017-06-19 MED ORDER — FLUORESCEIN SODIUM 1 MG OP STRP
1.0000 | ORAL_STRIP | Freq: Once | OPHTHALMIC | Status: DC
  Filled 2017-06-19: qty 1

## 2017-06-19 MED ORDER — SULFACETAMIDE SODIUM 10 % OP SOLN
1.0000 [drp] | Freq: Once | OPHTHALMIC | Status: AC
Start: 2017-06-19 — End: 2017-06-19
  Administered 2017-06-19: 1 [drp] via OPHTHALMIC
  Filled 2017-06-19: qty 15

## 2017-06-19 NOTE — ED Notes (Signed)
OD- 20/20  OS- 20/20  OU- 20-/20  Visual acuity with glasses

## 2017-06-19 NOTE — ED Notes (Signed)
Discharged delayed due to pt has a guardian and pt cannot be discharged until guardian is notified

## 2017-06-19 NOTE — ED Notes (Signed)
Pt unable to be Dc'd due to flag of guardian needs to be notified  Call to niece: [name] [phone]  She reports she is his HCPOA "if he is unable to make decisions" She reports he dies not need a guardian and one is not listed.  Evalee Jefferson, PA spoke with niece to explain why he cannot be discharged and to clarify

## 2017-06-19 NOTE — ED Notes (Signed)
Dr Z in to assess 

## 2017-06-19 NOTE — ED Triage Notes (Signed)
Patient c/o left eye pain that started Friday. Patient states some swelling to eyelid and clear drainage. Patient some blurred vision. Conjunctive red.

## 2017-06-19 NOTE — ED Provider Notes (Signed)
[facility] EMERGENCY DEPARTMENT Provider Note   CSN: [id number] Arrival date & time: 06/19/17  1243     History   Chief Complaint Chief Complaint  Patient presents with  . Eye Pain    HPI [name] is a 84 y.o. male with a history of COPD, ESRD on dialysis, lung and renal cancer, HTN  And chronic inflammatory demyelinating polyneuropathy (followed at [facility] for this) presenting with a 2 day history of left eye pain and redness with clear tearing and upper eyelid swelling.  He states has this problem every few years which has responded in the past to tobrex drops.  He had some left over from the last episode and has taken today without improvement.  He denies injury to the eye, also denies fb sensation, no fevers, chills, vision changes except blurry when watery.  He wears glasses, no contact lenses.  The history is provided by the patient.    Past Medical History:  Diagnosis Date  . Arthritis    ankle , hip, knees , low back   . BPH (benign prostatic hypertrophy)   . Cancer ([place])    lung  . Cancer ([place]) 2001   renal  . Chronic inflammatory demyelinating polyneuropathy ([place]) 04/07/11   ,diagnosed 1992, tx. /w prednisone x 17 yrs. has been d/c for 3 yrs.   . Chronic kidney disease       . COPD (chronic obstructive pulmonary disease) ([place])    told that he has "a bit of emphysema"  . Dialysis patient ([place])   . GERD (gastroesophageal reflux disease)   . Hypertension    stress test done - many years ago  . Kidney calculus   . Pneumonia     Patient Active Problem List   Diagnosis Date Noted  . Community acquired pneumonia 05/30/2017  . Influenza A 05/30/2017  . BPH (benign prostatic hypertrophy) 05/30/2017  . COPD (chronic obstructive pulmonary disease) ([place]) 05/30/2017  . Hypertension 05/30/2017  . GERD (gastroesophageal reflux disease) 05/30/2017  . Pulmonary nodules/lesions, multiple 09/14/2016  . Bowel habit changes 09/11/2015  . Non-small cell cancer of  left lung ([place]) 10/27/2012  . Visit for wound check 10/09/2012  . Swollen arm 09/15/2012  . Encounter for adequacy testing for hemodialysis ([place]) 09/01/2012  . Aftercare following surgery of the circulatory system, [place] 08/18/2012  . Other complications due to renal dialysis device, implant, and graft 04/19/2012  . End stage renal disease ([place]) 07/14/2011  . Pre-operative cardiovascular examination 04/07/2011    Past Surgical History:  Procedure Laterality Date  . ANKLE FUSION     R ankle  . AV FISTULA PLACEMENT  06/01/2011   Procedure: ARTERIOVENOUS (AV) FISTULA CREATION;  Surgeon: Angelia Mould, MD;  [facility] OR;  Service: Vascular;  Laterality: Left;  Creation of Left Arteriovenous fistula  . AV FISTULA PLACEMENT Left 07/12/2012   Procedure: ARTERIOVENOUS (AV) FISTULA CREATION;  Surgeon: Conrad [place], MD;  Location: [place];  Service: Vascular;  Laterality: Left;  . AV FISTULA PLACEMENT Left 4.11.14  . BASCILIC VEIN TRANSPOSITION Left 08/28/2012   Procedure: BASCILIC VEIN TRANSPOSITION;  Surgeon: Conrad , MD;  Location: [place];  Service: Vascular;  Laterality: Left;  left 2nd stage basilic vein transposition  . CHOLECYSTECTOMY    . COLONOSCOPY  2005   Dr. Laural Golden, diverticulosis, hemorrhoids, 2 small polyps  . FISTULOGRAM N/A 08/16/2011   Procedure: FISTULOGRAM;  Surgeon: Angelia Mould, MD;  Location: [facility] CATH LAB;  Service: Cardiovascular;  Laterality: N/A;  . fistulogram left radial cephalic AV fistula  [phone]     Dr. Scot Dock  . HIP ARTHROPLASTY Right    "partial"  . JOINT REPLACEMENT     L knee- 2010- [facility], L hip partial replacement   . LUNG REMOVAL, PARTIAL  2003   partial, right  . NEPHRECTOMY  2001   left  . SHUNTOGRAM N/A 05/15/2012   Procedure: Earney Mallet;  Surgeon: Conrad Salunga, MD;  Location: [facility] CATH LAB;  Service: Cardiovascular;  Laterality: N/A;  . SHUNTOGRAM N/A 05/09/2014   Procedure: FISTULOGRAM;  Surgeon: Conrad Gordon, MD;  Location: [facility] CATH  LAB;  Service: Cardiovascular;  Laterality: N/A;       Home Medications    Prior to Admission medications   Medication Sig Start Date End Date Taking? Authorizing Provider  albuterol (PROVENTIL HFA;VENTOLIN HFA) 108 (90 Base) MCG/ACT inhaler Inhale 1-2 puffs into the lungs every 6 (six) hours as needed for wheezing or shortness of breath.    [provider]  albuterol (PROVENTIL) (2.5 MG/3ML) 0.083% nebulizer solution Take 3 mLs (2.5 mg total) by nebulization every 6 (six) hours as needed for wheezing or shortness of breath. 06/03/17   Kathie Dike, MD  cinacalcet (SENSIPAR) 30 MG tablet Take 30 mg by mouth daily with supper.    [provider]  clobetasol cream (TEMOVATE) 9.52 % Apply 1 application topically 2 (two) times daily as needed (itchy ears).    [provider]  doxycycline (VIBRA-TABS) 100 MG tablet Take 1 tablet (100 mg total) by mouth every 12 (twelve) hours. 06/03/17   Kathie Dike, MD  guaiFENesin (MUCINEX) 600 MG 12 hr tablet Take 1 tablet (600 mg total) by mouth 2 (two) times daily. 06/03/17   Kathie Dike, MD  lidocaine-prilocaine (EMLA) cream Apply 1 application topically as directed. Apply to access site for dialysis 1 hour prior to visit 12/17/14   [provider]  metoprolol succinate (TOPROL-XL) 25 MG 24 hr tablet Take 25 mg by mouth every morning.     [provider]  pantoprazole (PROTONIX) 40 MG tablet Take 1 tablet (40 mg total) by mouth daily at 6 (six) AM. 06/04/17   Kathie Dike, MD  predniSONE (DELTASONE) 10 MG tablet Take 40mg  po daily for 2 days then 30mg  daily for 2 days then 20mg  daily for 2 days then 10mg  daily for 2 days then stop 06/03/17   Kathie Dike, MD  sevelamer carbonate (RENVELA) 800 MG tablet Take 800-1,600 mg by mouth as directed. Take 2 tablets (1600mg ) three times a day with meals and 1 tablet (800mg ) with a snack    [provider]    Family History Family History  Problem  Relation Age of Onset  . Heart disease Mother   . Heart disease Father   . Heart attack Father   . Anesthesia problems Neg Hx   . Hypotension Neg Hx   . Malignant hyperthermia Neg Hx   . Pseudochol deficiency Neg Hx   . Colon cancer Neg Hx     Social History Social History   Tobacco Use  . Smoking status: Former Smoker    Years: 35.00    Types: Cigarettes    Last attempt to quit: 02/04/1977    Years since quitting: 40.4  . Smokeless tobacco: Current User    Types: Chew  . Tobacco comment: Quit in 1978  Substance Use Topics  . Alcohol use: No    Alcohol/week: 0.0 oz  . Drug use:  No     Allergies   Patient has no known allergies.   Review of Systems Review of Systems  Constitutional: Negative for chills and fever.  HENT: Negative for congestion, ear pain, rhinorrhea, sinus pressure, sore throat, trouble swallowing and voice change.   Eyes: Positive for pain and redness. Negative for photophobia, discharge and visual disturbance.  Respiratory: Positive for cough. Negative for shortness of breath, wheezing and stridor.   Cardiovascular: Negative for chest pain.  Gastrointestinal: Negative for abdominal pain.  Genitourinary: Negative.      Physical Exam Updated Vital Signs BP (!) 155/73 (BP Location: Right Arm)   Pulse 83   Temp 98.2 F (36.8 C) (Oral)   Resp 16   Ht 5\' 7"  (1.702 m)   Wt 71.2 kg (157 lb)   SpO2 98%   BMI 24.59 kg/m   Physical Exam  Constitutional: He appears well-developed and well-nourished.  HENT:  Head: Normocephalic and atraumatic.  Eyes: EOM are normal. Pupils are equal, round, and reactive to light. Lids are everted and swept, no foreign bodies found. Right eye exhibits no discharge and no exudate. No foreign body present in the right eye. Left eye exhibits no discharge and no exudate. No foreign body present in the left eye. Right conjunctiva is not injected. Left conjunctiva is injected.  Slit lamp exam:      The left eye shows no  corneal abrasion, no corneal ulcer, no foreign body, no fluorescein uptake and no anterior chamber bulge.  Ectropion, left greater than right lower lids. Left upper lid with mild chemosis, no erythema.  OD- 20/20  OS- 20/20  OU- 20-/20  Visual acuity with glasses   Left eye pressure measured x 3 with mild elevation, 35, 30 and repeat 65mmHg.   Neck: Normal range of motion.  Cardiovascular: Normal rate, regular rhythm, normal heart sounds and intact distal pulses.  Pulmonary/Chest: Effort normal and breath sounds normal. He has no wheezes.  Abdominal: Soft. Bowel sounds are normal. There is no tenderness.  Musculoskeletal: Normal range of motion.  Neurological: He is alert.  Skin: Skin is warm and dry.  Psychiatric: He has a normal mood and affect.  Nursing note and vitals reviewed.    ED Treatments / Results  Labs (all labs ordered are listed, but only abnormal results are displayed) Labs Reviewed - No data to display  EKG  EKG Interpretation None       Radiology No results found.  Procedures Procedures (including critical care time)  Medications Ordered in ED Medications  ketorolac (ACULAR) 0.5 % ophthalmic solution 1 drop (1 drop Left Eye Given 06/19/17 1809)  sulfacetamide (BLEPH-10) 10 % ophthalmic solution 1 drop (1 drop Both Eyes Given 06/19/17 1810)     Initial Impression / Assessment and Plan / ED Course  I have reviewed the triage vital signs and the nursing notes.  Pertinent labs & imaging results that were available during my care of the patient were reviewed by me and considered in my medical decision making (see chart for details).     Pt with left eye pain and erythema with mild elevation in pressure, but exam most c/w conjunctivitis.  He was placed on bleph 10 and ketorolac drops. Pt plans f/u with his ophthalmologist tomorrow. Discussed elevated pressure in the eye and need for close f/u and recheck. Pt aware and will discuss with his provider  tomorrow.   Discussed with Dr Rogene Houston prior to dc home. He also saw pt prior to dc.  Final Clinical Impressions(s) / ED Diagnoses   Final diagnoses:  Acute conjunctivitis of left eye, unspecified acute conjunctivitis type  Increased pressure in the eye, left    ED Discharge Orders    None       Landis Martins 06/20/17 2323    Fredia Sorrow, MD 06/23/17 [phone]

## 2017-06-19 NOTE — Discharge Instructions (Signed)
Your exam today suggest a viral conjunctivitis and you are given an eyedrop for this condition.  Apply 1 drop of the sulfacetamide drop to both eyes 4 times daily, roughly every 6 hours for the next 7 days.  You may also apply 1 drop of the ketorolac in your left eye also 4 times daily which will help with pain, swelling and redness.  Plan to see your eye doctor tomorrow for recheck of this condition.  As discussed the pressure in your left eye is slightly elevated with your average pressure reading 35 mmHg. this pressure reading should be less than 70mmHg. you need to have this rechecked by your eye doctor tomorrow.

## 2017-06-19 NOTE — ED Provider Notes (Signed)
Medical screening examination/treatment/procedure(s) were conducted as a shared visit with non-physician practitioner(s) and myself.  I personally evaluated the patient during the encounter.   EKG Interpretation None       Patient seen by me along with the physician assistant.  Patient with a complaint of some irritation to the left eye and some pain starting on Friday evening.  Patient had similar things happen in the past related to some sort of a connective tissue disorder.  Patient is a dialysis patient.  Patient wears glasses not contacts.  No significant pain with movement of the eye.  There is a little bit of swelling to the upper eyelid and lower eyelid.  No real evidence of a cellulitis.  Floor seen staining without any uptake.  Ocular pressures somewhat borderline.  But around 40.  Clinically we do not think this is acute glaucoma.  Pressures and eye evaluation done by the physician assistant.  Patient is followed by optometry local.  We will try to treat him symptomatically and have him follow-up with his eye doctor tomorrow.   Fredia Sorrow, MD 06/19/17 [phone]

## 2017-06-20 DIAGNOSIS — N2581 Secondary hyperparathyroidism of renal origin: Secondary | ICD-10-CM | POA: Diagnosis not present

## 2017-06-20 DIAGNOSIS — Z992 Dependence on renal dialysis: Secondary | ICD-10-CM | POA: Diagnosis not present

## 2017-06-20 DIAGNOSIS — N186 End stage renal disease: Secondary | ICD-10-CM | POA: Diagnosis not present

## 2017-06-20 DIAGNOSIS — D631 Anemia in chronic kidney disease: Secondary | ICD-10-CM | POA: Diagnosis not present

## 2017-06-20 DIAGNOSIS — D509 Iron deficiency anemia, unspecified: Secondary | ICD-10-CM | POA: Diagnosis not present

## 2017-06-21 DIAGNOSIS — H3530 Unspecified macular degeneration: Secondary | ICD-10-CM | POA: Diagnosis not present

## 2017-06-22 DIAGNOSIS — Z992 Dependence on renal dialysis: Secondary | ICD-10-CM | POA: Diagnosis not present

## 2017-06-22 DIAGNOSIS — D631 Anemia in chronic kidney disease: Secondary | ICD-10-CM | POA: Diagnosis not present

## 2017-06-22 DIAGNOSIS — N186 End stage renal disease: Secondary | ICD-10-CM | POA: Diagnosis not present

## 2017-06-22 DIAGNOSIS — D509 Iron deficiency anemia, unspecified: Secondary | ICD-10-CM | POA: Diagnosis not present

## 2017-06-22 DIAGNOSIS — N2581 Secondary hyperparathyroidism of renal origin: Secondary | ICD-10-CM | POA: Diagnosis not present

## 2017-06-23 DIAGNOSIS — H33052 Total retinal detachment, left eye: Secondary | ICD-10-CM | POA: Diagnosis not present

## 2017-06-23 DIAGNOSIS — H43811 Vitreous degeneration, right eye: Secondary | ICD-10-CM | POA: Diagnosis not present

## 2017-06-23 DIAGNOSIS — H33311 Horseshoe tear of retina without detachment, right eye: Secondary | ICD-10-CM | POA: Diagnosis not present

## 2017-06-23 DIAGNOSIS — H33012 Retinal detachment with single break, left eye: Secondary | ICD-10-CM | POA: Diagnosis not present

## 2017-06-24 DIAGNOSIS — N186 End stage renal disease: Secondary | ICD-10-CM | POA: Diagnosis not present

## 2017-06-24 DIAGNOSIS — N2581 Secondary hyperparathyroidism of renal origin: Secondary | ICD-10-CM | POA: Diagnosis not present

## 2017-06-24 DIAGNOSIS — Z992 Dependence on renal dialysis: Secondary | ICD-10-CM | POA: Diagnosis not present

## 2017-06-24 DIAGNOSIS — D631 Anemia in chronic kidney disease: Secondary | ICD-10-CM | POA: Diagnosis not present

## 2017-06-24 DIAGNOSIS — D509 Iron deficiency anemia, unspecified: Secondary | ICD-10-CM | POA: Diagnosis not present

## 2017-06-27 DIAGNOSIS — N2581 Secondary hyperparathyroidism of renal origin: Secondary | ICD-10-CM | POA: Diagnosis not present

## 2017-06-27 DIAGNOSIS — Z992 Dependence on renal dialysis: Secondary | ICD-10-CM | POA: Diagnosis not present

## 2017-06-27 DIAGNOSIS — D509 Iron deficiency anemia, unspecified: Secondary | ICD-10-CM | POA: Diagnosis not present

## 2017-06-27 DIAGNOSIS — H33311 Horseshoe tear of retina without detachment, right eye: Secondary | ICD-10-CM | POA: Diagnosis not present

## 2017-06-27 DIAGNOSIS — D631 Anemia in chronic kidney disease: Secondary | ICD-10-CM | POA: Diagnosis not present

## 2017-06-27 DIAGNOSIS — N186 End stage renal disease: Secondary | ICD-10-CM | POA: Diagnosis not present

## 2017-06-29 DIAGNOSIS — N2581 Secondary hyperparathyroidism of renal origin: Secondary | ICD-10-CM | POA: Diagnosis not present

## 2017-06-29 DIAGNOSIS — N186 End stage renal disease: Secondary | ICD-10-CM | POA: Diagnosis not present

## 2017-06-29 DIAGNOSIS — Z992 Dependence on renal dialysis: Secondary | ICD-10-CM | POA: Diagnosis not present

## 2017-06-29 DIAGNOSIS — D509 Iron deficiency anemia, unspecified: Secondary | ICD-10-CM | POA: Diagnosis not present

## 2017-06-29 DIAGNOSIS — D631 Anemia in chronic kidney disease: Secondary | ICD-10-CM | POA: Diagnosis not present

## 2017-07-01 DIAGNOSIS — D631 Anemia in chronic kidney disease: Secondary | ICD-10-CM | POA: Diagnosis not present

## 2017-07-01 DIAGNOSIS — N2581 Secondary hyperparathyroidism of renal origin: Secondary | ICD-10-CM | POA: Diagnosis not present

## 2017-07-01 DIAGNOSIS — N186 End stage renal disease: Secondary | ICD-10-CM | POA: Diagnosis not present

## 2017-07-01 DIAGNOSIS — Z992 Dependence on renal dialysis: Secondary | ICD-10-CM | POA: Diagnosis not present

## 2017-07-01 DIAGNOSIS — D509 Iron deficiency anemia, unspecified: Secondary | ICD-10-CM | POA: Diagnosis not present

## 2017-07-04 DIAGNOSIS — N2581 Secondary hyperparathyroidism of renal origin: Secondary | ICD-10-CM | POA: Diagnosis not present

## 2017-07-04 DIAGNOSIS — D631 Anemia in chronic kidney disease: Secondary | ICD-10-CM | POA: Diagnosis not present

## 2017-07-04 DIAGNOSIS — N186 End stage renal disease: Secondary | ICD-10-CM | POA: Diagnosis not present

## 2017-07-04 DIAGNOSIS — Z992 Dependence on renal dialysis: Secondary | ICD-10-CM | POA: Diagnosis not present

## 2017-07-04 DIAGNOSIS — H33012 Retinal detachment with single break, left eye: Secondary | ICD-10-CM | POA: Diagnosis not present

## 2017-07-04 DIAGNOSIS — D509 Iron deficiency anemia, unspecified: Secondary | ICD-10-CM | POA: Diagnosis not present

## 2017-07-06 DIAGNOSIS — N186 End stage renal disease: Secondary | ICD-10-CM | POA: Diagnosis not present

## 2017-07-06 DIAGNOSIS — Z992 Dependence on renal dialysis: Secondary | ICD-10-CM | POA: Diagnosis not present

## 2017-07-06 DIAGNOSIS — D631 Anemia in chronic kidney disease: Secondary | ICD-10-CM | POA: Diagnosis not present

## 2017-07-06 DIAGNOSIS — N2581 Secondary hyperparathyroidism of renal origin: Secondary | ICD-10-CM | POA: Diagnosis not present

## 2017-07-06 DIAGNOSIS — D509 Iron deficiency anemia, unspecified: Secondary | ICD-10-CM | POA: Diagnosis not present

## 2017-07-07 DIAGNOSIS — Z992 Dependence on renal dialysis: Secondary | ICD-10-CM | POA: Diagnosis not present

## 2017-07-07 DIAGNOSIS — N186 End stage renal disease: Secondary | ICD-10-CM | POA: Diagnosis not present

## 2017-07-08 DIAGNOSIS — N2581 Secondary hyperparathyroidism of renal origin: Secondary | ICD-10-CM | POA: Diagnosis not present

## 2017-07-08 DIAGNOSIS — D509 Iron deficiency anemia, unspecified: Secondary | ICD-10-CM | POA: Diagnosis not present

## 2017-07-08 DIAGNOSIS — N186 End stage renal disease: Secondary | ICD-10-CM | POA: Diagnosis not present

## 2017-07-08 DIAGNOSIS — D631 Anemia in chronic kidney disease: Secondary | ICD-10-CM | POA: Diagnosis not present

## 2017-07-08 DIAGNOSIS — Z992 Dependence on renal dialysis: Secondary | ICD-10-CM | POA: Diagnosis not present

## 2017-07-11 DIAGNOSIS — D631 Anemia in chronic kidney disease: Secondary | ICD-10-CM | POA: Diagnosis not present

## 2017-07-11 DIAGNOSIS — D509 Iron deficiency anemia, unspecified: Secondary | ICD-10-CM | POA: Diagnosis not present

## 2017-07-11 DIAGNOSIS — Z992 Dependence on renal dialysis: Secondary | ICD-10-CM | POA: Diagnosis not present

## 2017-07-11 DIAGNOSIS — N2581 Secondary hyperparathyroidism of renal origin: Secondary | ICD-10-CM | POA: Diagnosis not present

## 2017-07-11 DIAGNOSIS — N186 End stage renal disease: Secondary | ICD-10-CM | POA: Diagnosis not present

## 2017-07-13 DIAGNOSIS — D631 Anemia in chronic kidney disease: Secondary | ICD-10-CM | POA: Diagnosis not present

## 2017-07-13 DIAGNOSIS — N186 End stage renal disease: Secondary | ICD-10-CM | POA: Diagnosis not present

## 2017-07-13 DIAGNOSIS — D509 Iron deficiency anemia, unspecified: Secondary | ICD-10-CM | POA: Diagnosis not present

## 2017-07-13 DIAGNOSIS — Z992 Dependence on renal dialysis: Secondary | ICD-10-CM | POA: Diagnosis not present

## 2017-07-13 DIAGNOSIS — N2581 Secondary hyperparathyroidism of renal origin: Secondary | ICD-10-CM | POA: Diagnosis not present

## 2017-07-15 DIAGNOSIS — D509 Iron deficiency anemia, unspecified: Secondary | ICD-10-CM | POA: Diagnosis not present

## 2017-07-15 DIAGNOSIS — D631 Anemia in chronic kidney disease: Secondary | ICD-10-CM | POA: Diagnosis not present

## 2017-07-15 DIAGNOSIS — N186 End stage renal disease: Secondary | ICD-10-CM | POA: Diagnosis not present

## 2017-07-15 DIAGNOSIS — Z992 Dependence on renal dialysis: Secondary | ICD-10-CM | POA: Diagnosis not present

## 2017-07-15 DIAGNOSIS — N2581 Secondary hyperparathyroidism of renal origin: Secondary | ICD-10-CM | POA: Diagnosis not present

## 2017-07-18 DIAGNOSIS — N186 End stage renal disease: Secondary | ICD-10-CM | POA: Diagnosis not present

## 2017-07-18 DIAGNOSIS — N2581 Secondary hyperparathyroidism of renal origin: Secondary | ICD-10-CM | POA: Diagnosis not present

## 2017-07-18 DIAGNOSIS — H6123 Impacted cerumen, bilateral: Secondary | ICD-10-CM | POA: Diagnosis not present

## 2017-07-18 DIAGNOSIS — D509 Iron deficiency anemia, unspecified: Secondary | ICD-10-CM | POA: Diagnosis not present

## 2017-07-18 DIAGNOSIS — D631 Anemia in chronic kidney disease: Secondary | ICD-10-CM | POA: Diagnosis not present

## 2017-07-18 DIAGNOSIS — Z992 Dependence on renal dialysis: Secondary | ICD-10-CM | POA: Diagnosis not present

## 2017-07-20 DIAGNOSIS — N2581 Secondary hyperparathyroidism of renal origin: Secondary | ICD-10-CM | POA: Diagnosis not present

## 2017-07-20 DIAGNOSIS — N186 End stage renal disease: Secondary | ICD-10-CM | POA: Diagnosis not present

## 2017-07-20 DIAGNOSIS — D631 Anemia in chronic kidney disease: Secondary | ICD-10-CM | POA: Diagnosis not present

## 2017-07-20 DIAGNOSIS — Z992 Dependence on renal dialysis: Secondary | ICD-10-CM | POA: Diagnosis not present

## 2017-07-20 DIAGNOSIS — D509 Iron deficiency anemia, unspecified: Secondary | ICD-10-CM | POA: Diagnosis not present

## 2017-07-22 DIAGNOSIS — N186 End stage renal disease: Secondary | ICD-10-CM | POA: Diagnosis not present

## 2017-07-22 DIAGNOSIS — Z992 Dependence on renal dialysis: Secondary | ICD-10-CM | POA: Diagnosis not present

## 2017-07-22 DIAGNOSIS — N2581 Secondary hyperparathyroidism of renal origin: Secondary | ICD-10-CM | POA: Diagnosis not present

## 2017-07-22 DIAGNOSIS — D631 Anemia in chronic kidney disease: Secondary | ICD-10-CM | POA: Diagnosis not present

## 2017-07-22 DIAGNOSIS — D509 Iron deficiency anemia, unspecified: Secondary | ICD-10-CM | POA: Diagnosis not present

## 2017-07-25 DIAGNOSIS — H33012 Retinal detachment with single break, left eye: Secondary | ICD-10-CM | POA: Diagnosis not present

## 2017-07-25 DIAGNOSIS — D631 Anemia in chronic kidney disease: Secondary | ICD-10-CM | POA: Diagnosis not present

## 2017-07-25 DIAGNOSIS — D509 Iron deficiency anemia, unspecified: Secondary | ICD-10-CM | POA: Diagnosis not present

## 2017-07-25 DIAGNOSIS — N2581 Secondary hyperparathyroidism of renal origin: Secondary | ICD-10-CM | POA: Diagnosis not present

## 2017-07-25 DIAGNOSIS — Z992 Dependence on renal dialysis: Secondary | ICD-10-CM | POA: Diagnosis not present

## 2017-07-25 DIAGNOSIS — N186 End stage renal disease: Secondary | ICD-10-CM | POA: Diagnosis not present

## 2017-07-27 DIAGNOSIS — Z992 Dependence on renal dialysis: Secondary | ICD-10-CM | POA: Diagnosis not present

## 2017-07-27 DIAGNOSIS — D509 Iron deficiency anemia, unspecified: Secondary | ICD-10-CM | POA: Diagnosis not present

## 2017-07-27 DIAGNOSIS — D631 Anemia in chronic kidney disease: Secondary | ICD-10-CM | POA: Diagnosis not present

## 2017-07-27 DIAGNOSIS — N186 End stage renal disease: Secondary | ICD-10-CM | POA: Diagnosis not present

## 2017-07-27 DIAGNOSIS — N2581 Secondary hyperparathyroidism of renal origin: Secondary | ICD-10-CM | POA: Diagnosis not present

## 2017-07-29 DIAGNOSIS — D509 Iron deficiency anemia, unspecified: Secondary | ICD-10-CM | POA: Diagnosis not present

## 2017-07-29 DIAGNOSIS — N186 End stage renal disease: Secondary | ICD-10-CM | POA: Diagnosis not present

## 2017-07-29 DIAGNOSIS — D631 Anemia in chronic kidney disease: Secondary | ICD-10-CM | POA: Diagnosis not present

## 2017-07-29 DIAGNOSIS — N2581 Secondary hyperparathyroidism of renal origin: Secondary | ICD-10-CM | POA: Diagnosis not present

## 2017-07-29 DIAGNOSIS — Z992 Dependence on renal dialysis: Secondary | ICD-10-CM | POA: Diagnosis not present

## 2017-08-01 DIAGNOSIS — N186 End stage renal disease: Secondary | ICD-10-CM | POA: Diagnosis not present

## 2017-08-01 DIAGNOSIS — D631 Anemia in chronic kidney disease: Secondary | ICD-10-CM | POA: Diagnosis not present

## 2017-08-01 DIAGNOSIS — D509 Iron deficiency anemia, unspecified: Secondary | ICD-10-CM | POA: Diagnosis not present

## 2017-08-01 DIAGNOSIS — N2581 Secondary hyperparathyroidism of renal origin: Secondary | ICD-10-CM | POA: Diagnosis not present

## 2017-08-01 DIAGNOSIS — Z992 Dependence on renal dialysis: Secondary | ICD-10-CM | POA: Diagnosis not present

## 2017-08-03 DIAGNOSIS — N2581 Secondary hyperparathyroidism of renal origin: Secondary | ICD-10-CM | POA: Diagnosis not present

## 2017-08-03 DIAGNOSIS — D509 Iron deficiency anemia, unspecified: Secondary | ICD-10-CM | POA: Diagnosis not present

## 2017-08-03 DIAGNOSIS — D631 Anemia in chronic kidney disease: Secondary | ICD-10-CM | POA: Diagnosis not present

## 2017-08-03 DIAGNOSIS — N186 End stage renal disease: Secondary | ICD-10-CM | POA: Diagnosis not present

## 2017-08-03 DIAGNOSIS — Z992 Dependence on renal dialysis: Secondary | ICD-10-CM | POA: Diagnosis not present

## 2017-08-05 DIAGNOSIS — D631 Anemia in chronic kidney disease: Secondary | ICD-10-CM | POA: Diagnosis not present

## 2017-08-05 DIAGNOSIS — N2581 Secondary hyperparathyroidism of renal origin: Secondary | ICD-10-CM | POA: Diagnosis not present

## 2017-08-05 DIAGNOSIS — N186 End stage renal disease: Secondary | ICD-10-CM | POA: Diagnosis not present

## 2017-08-05 DIAGNOSIS — D509 Iron deficiency anemia, unspecified: Secondary | ICD-10-CM | POA: Diagnosis not present

## 2017-08-05 DIAGNOSIS — Z992 Dependence on renal dialysis: Secondary | ICD-10-CM | POA: Diagnosis not present

## 2017-08-07 DIAGNOSIS — N186 End stage renal disease: Secondary | ICD-10-CM | POA: Diagnosis not present

## 2017-08-07 DIAGNOSIS — Z992 Dependence on renal dialysis: Secondary | ICD-10-CM | POA: Diagnosis not present

## 2017-08-08 DIAGNOSIS — D631 Anemia in chronic kidney disease: Secondary | ICD-10-CM | POA: Diagnosis not present

## 2017-08-08 DIAGNOSIS — N186 End stage renal disease: Secondary | ICD-10-CM | POA: Diagnosis not present

## 2017-08-08 DIAGNOSIS — D509 Iron deficiency anemia, unspecified: Secondary | ICD-10-CM | POA: Diagnosis not present

## 2017-08-08 DIAGNOSIS — Z992 Dependence on renal dialysis: Secondary | ICD-10-CM | POA: Diagnosis not present

## 2017-08-08 DIAGNOSIS — N2581 Secondary hyperparathyroidism of renal origin: Secondary | ICD-10-CM | POA: Diagnosis not present

## 2017-08-10 DIAGNOSIS — D631 Anemia in chronic kidney disease: Secondary | ICD-10-CM | POA: Diagnosis not present

## 2017-08-10 DIAGNOSIS — Z992 Dependence on renal dialysis: Secondary | ICD-10-CM | POA: Diagnosis not present

## 2017-08-10 DIAGNOSIS — N2581 Secondary hyperparathyroidism of renal origin: Secondary | ICD-10-CM | POA: Diagnosis not present

## 2017-08-10 DIAGNOSIS — Z0000 Encounter for general adult medical examination without abnormal findings: Secondary | ICD-10-CM | POA: Diagnosis not present

## 2017-08-10 DIAGNOSIS — N186 End stage renal disease: Secondary | ICD-10-CM | POA: Diagnosis not present

## 2017-08-10 DIAGNOSIS — E663 Overweight: Secondary | ICD-10-CM | POA: Diagnosis not present

## 2017-08-10 DIAGNOSIS — D509 Iron deficiency anemia, unspecified: Secondary | ICD-10-CM | POA: Diagnosis not present

## 2017-08-10 DIAGNOSIS — Z6826 Body mass index (BMI) 26.0-26.9, adult: Secondary | ICD-10-CM | POA: Diagnosis not present

## 2017-08-10 DIAGNOSIS — Z1389 Encounter for screening for other disorder: Secondary | ICD-10-CM | POA: Diagnosis not present

## 2017-08-12 DIAGNOSIS — D631 Anemia in chronic kidney disease: Secondary | ICD-10-CM | POA: Diagnosis not present

## 2017-08-12 DIAGNOSIS — D509 Iron deficiency anemia, unspecified: Secondary | ICD-10-CM | POA: Diagnosis not present

## 2017-08-12 DIAGNOSIS — Z992 Dependence on renal dialysis: Secondary | ICD-10-CM | POA: Diagnosis not present

## 2017-08-12 DIAGNOSIS — N2581 Secondary hyperparathyroidism of renal origin: Secondary | ICD-10-CM | POA: Diagnosis not present

## 2017-08-12 DIAGNOSIS — N186 End stage renal disease: Secondary | ICD-10-CM | POA: Diagnosis not present

## 2017-08-15 DIAGNOSIS — Z992 Dependence on renal dialysis: Secondary | ICD-10-CM | POA: Diagnosis not present

## 2017-08-15 DIAGNOSIS — D509 Iron deficiency anemia, unspecified: Secondary | ICD-10-CM | POA: Diagnosis not present

## 2017-08-15 DIAGNOSIS — N186 End stage renal disease: Secondary | ICD-10-CM | POA: Diagnosis not present

## 2017-08-15 DIAGNOSIS — N2581 Secondary hyperparathyroidism of renal origin: Secondary | ICD-10-CM | POA: Diagnosis not present

## 2017-08-15 DIAGNOSIS — D631 Anemia in chronic kidney disease: Secondary | ICD-10-CM | POA: Diagnosis not present

## 2017-08-17 DIAGNOSIS — D509 Iron deficiency anemia, unspecified: Secondary | ICD-10-CM | POA: Diagnosis not present

## 2017-08-17 DIAGNOSIS — N186 End stage renal disease: Secondary | ICD-10-CM | POA: Diagnosis not present

## 2017-08-17 DIAGNOSIS — N2581 Secondary hyperparathyroidism of renal origin: Secondary | ICD-10-CM | POA: Diagnosis not present

## 2017-08-17 DIAGNOSIS — D631 Anemia in chronic kidney disease: Secondary | ICD-10-CM | POA: Diagnosis not present

## 2017-08-17 DIAGNOSIS — Z992 Dependence on renal dialysis: Secondary | ICD-10-CM | POA: Diagnosis not present

## 2017-08-19 DIAGNOSIS — Z992 Dependence on renal dialysis: Secondary | ICD-10-CM | POA: Diagnosis not present

## 2017-08-19 DIAGNOSIS — N186 End stage renal disease: Secondary | ICD-10-CM | POA: Diagnosis not present

## 2017-08-19 DIAGNOSIS — D631 Anemia in chronic kidney disease: Secondary | ICD-10-CM | POA: Diagnosis not present

## 2017-08-19 DIAGNOSIS — N2581 Secondary hyperparathyroidism of renal origin: Secondary | ICD-10-CM | POA: Diagnosis not present

## 2017-08-19 DIAGNOSIS — D509 Iron deficiency anemia, unspecified: Secondary | ICD-10-CM | POA: Diagnosis not present

## 2017-08-22 DIAGNOSIS — N2581 Secondary hyperparathyroidism of renal origin: Secondary | ICD-10-CM | POA: Diagnosis not present

## 2017-08-22 DIAGNOSIS — Z992 Dependence on renal dialysis: Secondary | ICD-10-CM | POA: Diagnosis not present

## 2017-08-22 DIAGNOSIS — D509 Iron deficiency anemia, unspecified: Secondary | ICD-10-CM | POA: Diagnosis not present

## 2017-08-22 DIAGNOSIS — D631 Anemia in chronic kidney disease: Secondary | ICD-10-CM | POA: Diagnosis not present

## 2017-08-22 DIAGNOSIS — N186 End stage renal disease: Secondary | ICD-10-CM | POA: Diagnosis not present

## 2017-08-24 DIAGNOSIS — N186 End stage renal disease: Secondary | ICD-10-CM | POA: Diagnosis not present

## 2017-08-24 DIAGNOSIS — N2581 Secondary hyperparathyroidism of renal origin: Secondary | ICD-10-CM | POA: Diagnosis not present

## 2017-08-24 DIAGNOSIS — Z992 Dependence on renal dialysis: Secondary | ICD-10-CM | POA: Diagnosis not present

## 2017-08-24 DIAGNOSIS — D631 Anemia in chronic kidney disease: Secondary | ICD-10-CM | POA: Diagnosis not present

## 2017-08-24 DIAGNOSIS — D509 Iron deficiency anemia, unspecified: Secondary | ICD-10-CM | POA: Diagnosis not present

## 2017-08-26 DIAGNOSIS — D631 Anemia in chronic kidney disease: Secondary | ICD-10-CM | POA: Diagnosis not present

## 2017-08-26 DIAGNOSIS — N186 End stage renal disease: Secondary | ICD-10-CM | POA: Diagnosis not present

## 2017-08-26 DIAGNOSIS — N2581 Secondary hyperparathyroidism of renal origin: Secondary | ICD-10-CM | POA: Diagnosis not present

## 2017-08-26 DIAGNOSIS — Z992 Dependence on renal dialysis: Secondary | ICD-10-CM | POA: Diagnosis not present

## 2017-08-26 DIAGNOSIS — D509 Iron deficiency anemia, unspecified: Secondary | ICD-10-CM | POA: Diagnosis not present

## 2017-08-29 DIAGNOSIS — D631 Anemia in chronic kidney disease: Secondary | ICD-10-CM | POA: Diagnosis not present

## 2017-08-29 DIAGNOSIS — D509 Iron deficiency anemia, unspecified: Secondary | ICD-10-CM | POA: Diagnosis not present

## 2017-08-29 DIAGNOSIS — N2581 Secondary hyperparathyroidism of renal origin: Secondary | ICD-10-CM | POA: Diagnosis not present

## 2017-08-29 DIAGNOSIS — Z992 Dependence on renal dialysis: Secondary | ICD-10-CM | POA: Diagnosis not present

## 2017-08-29 DIAGNOSIS — N186 End stage renal disease: Secondary | ICD-10-CM | POA: Diagnosis not present

## 2017-08-31 DIAGNOSIS — Z992 Dependence on renal dialysis: Secondary | ICD-10-CM | POA: Diagnosis not present

## 2017-08-31 DIAGNOSIS — N2581 Secondary hyperparathyroidism of renal origin: Secondary | ICD-10-CM | POA: Diagnosis not present

## 2017-08-31 DIAGNOSIS — D509 Iron deficiency anemia, unspecified: Secondary | ICD-10-CM | POA: Diagnosis not present

## 2017-08-31 DIAGNOSIS — D631 Anemia in chronic kidney disease: Secondary | ICD-10-CM | POA: Diagnosis not present

## 2017-08-31 DIAGNOSIS — N186 End stage renal disease: Secondary | ICD-10-CM | POA: Diagnosis not present

## 2017-09-02 DIAGNOSIS — D509 Iron deficiency anemia, unspecified: Secondary | ICD-10-CM | POA: Diagnosis not present

## 2017-09-02 DIAGNOSIS — Z992 Dependence on renal dialysis: Secondary | ICD-10-CM | POA: Diagnosis not present

## 2017-09-02 DIAGNOSIS — D631 Anemia in chronic kidney disease: Secondary | ICD-10-CM | POA: Diagnosis not present

## 2017-09-02 DIAGNOSIS — N2581 Secondary hyperparathyroidism of renal origin: Secondary | ICD-10-CM | POA: Diagnosis not present

## 2017-09-02 DIAGNOSIS — N186 End stage renal disease: Secondary | ICD-10-CM | POA: Diagnosis not present

## 2017-09-05 DIAGNOSIS — D631 Anemia in chronic kidney disease: Secondary | ICD-10-CM | POA: Diagnosis not present

## 2017-09-05 DIAGNOSIS — D509 Iron deficiency anemia, unspecified: Secondary | ICD-10-CM | POA: Diagnosis not present

## 2017-09-05 DIAGNOSIS — Z992 Dependence on renal dialysis: Secondary | ICD-10-CM | POA: Diagnosis not present

## 2017-09-05 DIAGNOSIS — N2581 Secondary hyperparathyroidism of renal origin: Secondary | ICD-10-CM | POA: Diagnosis not present

## 2017-09-05 DIAGNOSIS — N186 End stage renal disease: Secondary | ICD-10-CM | POA: Diagnosis not present

## 2017-09-06 DIAGNOSIS — N186 End stage renal disease: Secondary | ICD-10-CM | POA: Diagnosis not present

## 2017-09-06 DIAGNOSIS — Z992 Dependence on renal dialysis: Secondary | ICD-10-CM | POA: Diagnosis not present

## 2017-09-07 DIAGNOSIS — Z992 Dependence on renal dialysis: Secondary | ICD-10-CM | POA: Diagnosis not present

## 2017-09-07 DIAGNOSIS — N2581 Secondary hyperparathyroidism of renal origin: Secondary | ICD-10-CM | POA: Diagnosis not present

## 2017-09-07 DIAGNOSIS — D631 Anemia in chronic kidney disease: Secondary | ICD-10-CM | POA: Diagnosis not present

## 2017-09-07 DIAGNOSIS — N186 End stage renal disease: Secondary | ICD-10-CM | POA: Diagnosis not present

## 2017-09-07 DIAGNOSIS — D509 Iron deficiency anemia, unspecified: Secondary | ICD-10-CM | POA: Diagnosis not present

## 2017-09-09 DIAGNOSIS — Z992 Dependence on renal dialysis: Secondary | ICD-10-CM | POA: Diagnosis not present

## 2017-09-09 DIAGNOSIS — N2581 Secondary hyperparathyroidism of renal origin: Secondary | ICD-10-CM | POA: Diagnosis not present

## 2017-09-09 DIAGNOSIS — D631 Anemia in chronic kidney disease: Secondary | ICD-10-CM | POA: Diagnosis not present

## 2017-09-09 DIAGNOSIS — D509 Iron deficiency anemia, unspecified: Secondary | ICD-10-CM | POA: Diagnosis not present

## 2017-09-09 DIAGNOSIS — N186 End stage renal disease: Secondary | ICD-10-CM | POA: Diagnosis not present

## 2017-09-12 DIAGNOSIS — N2581 Secondary hyperparathyroidism of renal origin: Secondary | ICD-10-CM | POA: Diagnosis not present

## 2017-09-12 DIAGNOSIS — D509 Iron deficiency anemia, unspecified: Secondary | ICD-10-CM | POA: Diagnosis not present

## 2017-09-12 DIAGNOSIS — D631 Anemia in chronic kidney disease: Secondary | ICD-10-CM | POA: Diagnosis not present

## 2017-09-12 DIAGNOSIS — N186 End stage renal disease: Secondary | ICD-10-CM | POA: Diagnosis not present

## 2017-09-12 DIAGNOSIS — Z992 Dependence on renal dialysis: Secondary | ICD-10-CM | POA: Diagnosis not present

## 2017-09-14 DIAGNOSIS — Z992 Dependence on renal dialysis: Secondary | ICD-10-CM | POA: Diagnosis not present

## 2017-09-14 DIAGNOSIS — D509 Iron deficiency anemia, unspecified: Secondary | ICD-10-CM | POA: Diagnosis not present

## 2017-09-14 DIAGNOSIS — D631 Anemia in chronic kidney disease: Secondary | ICD-10-CM | POA: Diagnosis not present

## 2017-09-14 DIAGNOSIS — N2581 Secondary hyperparathyroidism of renal origin: Secondary | ICD-10-CM | POA: Diagnosis not present

## 2017-09-14 DIAGNOSIS — N186 End stage renal disease: Secondary | ICD-10-CM | POA: Diagnosis not present

## 2017-09-16 DIAGNOSIS — N2581 Secondary hyperparathyroidism of renal origin: Secondary | ICD-10-CM | POA: Diagnosis not present

## 2017-09-16 DIAGNOSIS — D509 Iron deficiency anemia, unspecified: Secondary | ICD-10-CM | POA: Diagnosis not present

## 2017-09-16 DIAGNOSIS — N186 End stage renal disease: Secondary | ICD-10-CM | POA: Diagnosis not present

## 2017-09-16 DIAGNOSIS — Z992 Dependence on renal dialysis: Secondary | ICD-10-CM | POA: Diagnosis not present

## 2017-09-16 DIAGNOSIS — D631 Anemia in chronic kidney disease: Secondary | ICD-10-CM | POA: Diagnosis not present

## 2017-09-19 DIAGNOSIS — N2581 Secondary hyperparathyroidism of renal origin: Secondary | ICD-10-CM | POA: Diagnosis not present

## 2017-09-19 DIAGNOSIS — D509 Iron deficiency anemia, unspecified: Secondary | ICD-10-CM | POA: Diagnosis not present

## 2017-09-19 DIAGNOSIS — Z992 Dependence on renal dialysis: Secondary | ICD-10-CM | POA: Diagnosis not present

## 2017-09-19 DIAGNOSIS — D631 Anemia in chronic kidney disease: Secondary | ICD-10-CM | POA: Diagnosis not present

## 2017-09-19 DIAGNOSIS — N186 End stage renal disease: Secondary | ICD-10-CM | POA: Diagnosis not present

## 2017-09-21 DIAGNOSIS — Z992 Dependence on renal dialysis: Secondary | ICD-10-CM | POA: Diagnosis not present

## 2017-09-21 DIAGNOSIS — N186 End stage renal disease: Secondary | ICD-10-CM | POA: Diagnosis not present

## 2017-09-21 DIAGNOSIS — D631 Anemia in chronic kidney disease: Secondary | ICD-10-CM | POA: Diagnosis not present

## 2017-09-21 DIAGNOSIS — D509 Iron deficiency anemia, unspecified: Secondary | ICD-10-CM | POA: Diagnosis not present

## 2017-09-21 DIAGNOSIS — N2581 Secondary hyperparathyroidism of renal origin: Secondary | ICD-10-CM | POA: Diagnosis not present

## 2017-09-23 DIAGNOSIS — D631 Anemia in chronic kidney disease: Secondary | ICD-10-CM | POA: Diagnosis not present

## 2017-09-23 DIAGNOSIS — N186 End stage renal disease: Secondary | ICD-10-CM | POA: Diagnosis not present

## 2017-09-23 DIAGNOSIS — Z992 Dependence on renal dialysis: Secondary | ICD-10-CM | POA: Diagnosis not present

## 2017-09-23 DIAGNOSIS — D509 Iron deficiency anemia, unspecified: Secondary | ICD-10-CM | POA: Diagnosis not present

## 2017-09-23 DIAGNOSIS — N2581 Secondary hyperparathyroidism of renal origin: Secondary | ICD-10-CM | POA: Diagnosis not present

## 2017-09-26 DIAGNOSIS — N186 End stage renal disease: Secondary | ICD-10-CM | POA: Diagnosis not present

## 2017-09-26 DIAGNOSIS — N2581 Secondary hyperparathyroidism of renal origin: Secondary | ICD-10-CM | POA: Diagnosis not present

## 2017-09-26 DIAGNOSIS — D509 Iron deficiency anemia, unspecified: Secondary | ICD-10-CM | POA: Diagnosis not present

## 2017-09-26 DIAGNOSIS — Z992 Dependence on renal dialysis: Secondary | ICD-10-CM | POA: Diagnosis not present

## 2017-09-26 DIAGNOSIS — D631 Anemia in chronic kidney disease: Secondary | ICD-10-CM | POA: Diagnosis not present

## 2017-09-27 DIAGNOSIS — R911 Solitary pulmonary nodule: Secondary | ICD-10-CM | POA: Diagnosis not present

## 2017-09-27 DIAGNOSIS — C3412 Malignant neoplasm of upper lobe, left bronchus or lung: Secondary | ICD-10-CM | POA: Diagnosis not present

## 2017-09-27 DIAGNOSIS — C641 Malignant neoplasm of right kidney, except renal pelvis: Secondary | ICD-10-CM | POA: Diagnosis not present

## 2017-09-28 DIAGNOSIS — D631 Anemia in chronic kidney disease: Secondary | ICD-10-CM | POA: Diagnosis not present

## 2017-09-28 DIAGNOSIS — Z992 Dependence on renal dialysis: Secondary | ICD-10-CM | POA: Diagnosis not present

## 2017-09-28 DIAGNOSIS — N186 End stage renal disease: Secondary | ICD-10-CM | POA: Diagnosis not present

## 2017-09-28 DIAGNOSIS — N2581 Secondary hyperparathyroidism of renal origin: Secondary | ICD-10-CM | POA: Diagnosis not present

## 2017-09-28 DIAGNOSIS — D509 Iron deficiency anemia, unspecified: Secondary | ICD-10-CM | POA: Diagnosis not present

## 2017-09-30 DIAGNOSIS — D509 Iron deficiency anemia, unspecified: Secondary | ICD-10-CM | POA: Diagnosis not present

## 2017-09-30 DIAGNOSIS — N2581 Secondary hyperparathyroidism of renal origin: Secondary | ICD-10-CM | POA: Diagnosis not present

## 2017-09-30 DIAGNOSIS — D631 Anemia in chronic kidney disease: Secondary | ICD-10-CM | POA: Diagnosis not present

## 2017-09-30 DIAGNOSIS — N186 End stage renal disease: Secondary | ICD-10-CM | POA: Diagnosis not present

## 2017-09-30 DIAGNOSIS — Z992 Dependence on renal dialysis: Secondary | ICD-10-CM | POA: Diagnosis not present

## 2017-10-03 DIAGNOSIS — D631 Anemia in chronic kidney disease: Secondary | ICD-10-CM | POA: Diagnosis not present

## 2017-10-03 DIAGNOSIS — N186 End stage renal disease: Secondary | ICD-10-CM | POA: Diagnosis not present

## 2017-10-03 DIAGNOSIS — Z992 Dependence on renal dialysis: Secondary | ICD-10-CM | POA: Diagnosis not present

## 2017-10-03 DIAGNOSIS — N2581 Secondary hyperparathyroidism of renal origin: Secondary | ICD-10-CM | POA: Diagnosis not present

## 2017-10-03 DIAGNOSIS — D509 Iron deficiency anemia, unspecified: Secondary | ICD-10-CM | POA: Diagnosis not present

## 2017-10-05 DIAGNOSIS — D509 Iron deficiency anemia, unspecified: Secondary | ICD-10-CM | POA: Diagnosis not present

## 2017-10-05 DIAGNOSIS — D631 Anemia in chronic kidney disease: Secondary | ICD-10-CM | POA: Diagnosis not present

## 2017-10-05 DIAGNOSIS — N2581 Secondary hyperparathyroidism of renal origin: Secondary | ICD-10-CM | POA: Diagnosis not present

## 2017-10-05 DIAGNOSIS — N186 End stage renal disease: Secondary | ICD-10-CM | POA: Diagnosis not present

## 2017-10-05 DIAGNOSIS — Z992 Dependence on renal dialysis: Secondary | ICD-10-CM | POA: Diagnosis not present

## 2017-10-07 DIAGNOSIS — D631 Anemia in chronic kidney disease: Secondary | ICD-10-CM | POA: Diagnosis not present

## 2017-10-07 DIAGNOSIS — N2581 Secondary hyperparathyroidism of renal origin: Secondary | ICD-10-CM | POA: Diagnosis not present

## 2017-10-07 DIAGNOSIS — Z992 Dependence on renal dialysis: Secondary | ICD-10-CM | POA: Diagnosis not present

## 2017-10-07 DIAGNOSIS — D509 Iron deficiency anemia, unspecified: Secondary | ICD-10-CM | POA: Diagnosis not present

## 2017-10-07 DIAGNOSIS — N186 End stage renal disease: Secondary | ICD-10-CM | POA: Diagnosis not present

## 2017-10-10 DIAGNOSIS — N186 End stage renal disease: Secondary | ICD-10-CM | POA: Diagnosis not present

## 2017-10-10 DIAGNOSIS — Z992 Dependence on renal dialysis: Secondary | ICD-10-CM | POA: Diagnosis not present

## 2017-10-10 DIAGNOSIS — D509 Iron deficiency anemia, unspecified: Secondary | ICD-10-CM | POA: Diagnosis not present

## 2017-10-10 DIAGNOSIS — N2581 Secondary hyperparathyroidism of renal origin: Secondary | ICD-10-CM | POA: Diagnosis not present

## 2017-10-11 DIAGNOSIS — C3490 Malignant neoplasm of unspecified part of unspecified bronchus or lung: Secondary | ICD-10-CM | POA: Diagnosis not present

## 2017-10-11 DIAGNOSIS — D472 Monoclonal gammopathy: Secondary | ICD-10-CM | POA: Diagnosis not present

## 2017-10-11 DIAGNOSIS — C641 Malignant neoplasm of right kidney, except renal pelvis: Secondary | ICD-10-CM | POA: Diagnosis not present

## 2017-10-11 DIAGNOSIS — Z902 Acquired absence of lung [part of]: Secondary | ICD-10-CM | POA: Diagnosis not present

## 2017-10-11 DIAGNOSIS — R918 Other nonspecific abnormal finding of lung field: Secondary | ICD-10-CM | POA: Diagnosis not present

## 2017-10-12 DIAGNOSIS — N186 End stage renal disease: Secondary | ICD-10-CM | POA: Diagnosis not present

## 2017-10-12 DIAGNOSIS — D509 Iron deficiency anemia, unspecified: Secondary | ICD-10-CM | POA: Diagnosis not present

## 2017-10-12 DIAGNOSIS — N2581 Secondary hyperparathyroidism of renal origin: Secondary | ICD-10-CM | POA: Diagnosis not present

## 2017-10-12 DIAGNOSIS — Z992 Dependence on renal dialysis: Secondary | ICD-10-CM | POA: Diagnosis not present

## 2017-10-14 DIAGNOSIS — N2581 Secondary hyperparathyroidism of renal origin: Secondary | ICD-10-CM | POA: Diagnosis not present

## 2017-10-14 DIAGNOSIS — Z992 Dependence on renal dialysis: Secondary | ICD-10-CM | POA: Diagnosis not present

## 2017-10-14 DIAGNOSIS — N186 End stage renal disease: Secondary | ICD-10-CM | POA: Diagnosis not present

## 2017-10-14 DIAGNOSIS — D509 Iron deficiency anemia, unspecified: Secondary | ICD-10-CM | POA: Diagnosis not present

## 2017-10-17 DIAGNOSIS — N186 End stage renal disease: Secondary | ICD-10-CM | POA: Diagnosis not present

## 2017-10-17 DIAGNOSIS — Z992 Dependence on renal dialysis: Secondary | ICD-10-CM | POA: Diagnosis not present

## 2017-10-17 DIAGNOSIS — D509 Iron deficiency anemia, unspecified: Secondary | ICD-10-CM | POA: Diagnosis not present

## 2017-10-17 DIAGNOSIS — N2581 Secondary hyperparathyroidism of renal origin: Secondary | ICD-10-CM | POA: Diagnosis not present

## 2017-10-19 DIAGNOSIS — D509 Iron deficiency anemia, unspecified: Secondary | ICD-10-CM | POA: Diagnosis not present

## 2017-10-19 DIAGNOSIS — N186 End stage renal disease: Secondary | ICD-10-CM | POA: Diagnosis not present

## 2017-10-19 DIAGNOSIS — N2581 Secondary hyperparathyroidism of renal origin: Secondary | ICD-10-CM | POA: Diagnosis not present

## 2017-10-19 DIAGNOSIS — Z992 Dependence on renal dialysis: Secondary | ICD-10-CM | POA: Diagnosis not present

## 2017-10-21 DIAGNOSIS — D509 Iron deficiency anemia, unspecified: Secondary | ICD-10-CM | POA: Diagnosis not present

## 2017-10-21 DIAGNOSIS — Z992 Dependence on renal dialysis: Secondary | ICD-10-CM | POA: Diagnosis not present

## 2017-10-21 DIAGNOSIS — N2581 Secondary hyperparathyroidism of renal origin: Secondary | ICD-10-CM | POA: Diagnosis not present

## 2017-10-21 DIAGNOSIS — N186 End stage renal disease: Secondary | ICD-10-CM | POA: Diagnosis not present

## 2017-10-24 DIAGNOSIS — N2581 Secondary hyperparathyroidism of renal origin: Secondary | ICD-10-CM | POA: Diagnosis not present

## 2017-10-24 DIAGNOSIS — N186 End stage renal disease: Secondary | ICD-10-CM | POA: Diagnosis not present

## 2017-10-24 DIAGNOSIS — Z992 Dependence on renal dialysis: Secondary | ICD-10-CM | POA: Diagnosis not present

## 2017-10-24 DIAGNOSIS — D509 Iron deficiency anemia, unspecified: Secondary | ICD-10-CM | POA: Diagnosis not present

## 2017-10-25 DIAGNOSIS — H40053 Ocular hypertension, bilateral: Secondary | ICD-10-CM | POA: Diagnosis not present

## 2017-10-26 DIAGNOSIS — N2581 Secondary hyperparathyroidism of renal origin: Secondary | ICD-10-CM | POA: Diagnosis not present

## 2017-10-26 DIAGNOSIS — Z992 Dependence on renal dialysis: Secondary | ICD-10-CM | POA: Diagnosis not present

## 2017-10-26 DIAGNOSIS — D509 Iron deficiency anemia, unspecified: Secondary | ICD-10-CM | POA: Diagnosis not present

## 2017-10-26 DIAGNOSIS — N186 End stage renal disease: Secondary | ICD-10-CM | POA: Diagnosis not present

## 2017-10-28 DIAGNOSIS — D509 Iron deficiency anemia, unspecified: Secondary | ICD-10-CM | POA: Diagnosis not present

## 2017-10-28 DIAGNOSIS — N2581 Secondary hyperparathyroidism of renal origin: Secondary | ICD-10-CM | POA: Diagnosis not present

## 2017-10-28 DIAGNOSIS — Z992 Dependence on renal dialysis: Secondary | ICD-10-CM | POA: Diagnosis not present

## 2017-10-28 DIAGNOSIS — N186 End stage renal disease: Secondary | ICD-10-CM | POA: Diagnosis not present

## 2017-10-31 DIAGNOSIS — Z992 Dependence on renal dialysis: Secondary | ICD-10-CM | POA: Diagnosis not present

## 2017-10-31 DIAGNOSIS — N186 End stage renal disease: Secondary | ICD-10-CM | POA: Diagnosis not present

## 2017-10-31 DIAGNOSIS — N2581 Secondary hyperparathyroidism of renal origin: Secondary | ICD-10-CM | POA: Diagnosis not present

## 2017-10-31 DIAGNOSIS — D509 Iron deficiency anemia, unspecified: Secondary | ICD-10-CM | POA: Diagnosis not present

## 2017-11-02 DIAGNOSIS — N2581 Secondary hyperparathyroidism of renal origin: Secondary | ICD-10-CM | POA: Diagnosis not present

## 2017-11-02 DIAGNOSIS — D509 Iron deficiency anemia, unspecified: Secondary | ICD-10-CM | POA: Diagnosis not present

## 2017-11-02 DIAGNOSIS — Z992 Dependence on renal dialysis: Secondary | ICD-10-CM | POA: Diagnosis not present

## 2017-11-02 DIAGNOSIS — N186 End stage renal disease: Secondary | ICD-10-CM | POA: Diagnosis not present

## 2017-11-04 DIAGNOSIS — N186 End stage renal disease: Secondary | ICD-10-CM | POA: Diagnosis not present

## 2017-11-04 DIAGNOSIS — D509 Iron deficiency anemia, unspecified: Secondary | ICD-10-CM | POA: Diagnosis not present

## 2017-11-04 DIAGNOSIS — N2581 Secondary hyperparathyroidism of renal origin: Secondary | ICD-10-CM | POA: Diagnosis not present

## 2017-11-04 DIAGNOSIS — Z992 Dependence on renal dialysis: Secondary | ICD-10-CM | POA: Diagnosis not present

## 2017-11-06 DIAGNOSIS — Z992 Dependence on renal dialysis: Secondary | ICD-10-CM | POA: Diagnosis not present

## 2017-11-06 DIAGNOSIS — N186 End stage renal disease: Secondary | ICD-10-CM | POA: Diagnosis not present

## 2017-11-07 DIAGNOSIS — N186 End stage renal disease: Secondary | ICD-10-CM | POA: Diagnosis not present

## 2017-11-07 DIAGNOSIS — D631 Anemia in chronic kidney disease: Secondary | ICD-10-CM | POA: Diagnosis not present

## 2017-11-07 DIAGNOSIS — N2581 Secondary hyperparathyroidism of renal origin: Secondary | ICD-10-CM | POA: Diagnosis not present

## 2017-11-07 DIAGNOSIS — Z992 Dependence on renal dialysis: Secondary | ICD-10-CM | POA: Diagnosis not present

## 2017-11-07 DIAGNOSIS — D509 Iron deficiency anemia, unspecified: Secondary | ICD-10-CM | POA: Diagnosis not present

## 2017-11-09 DIAGNOSIS — D509 Iron deficiency anemia, unspecified: Secondary | ICD-10-CM | POA: Diagnosis not present

## 2017-11-09 DIAGNOSIS — N2581 Secondary hyperparathyroidism of renal origin: Secondary | ICD-10-CM | POA: Diagnosis not present

## 2017-11-09 DIAGNOSIS — N186 End stage renal disease: Secondary | ICD-10-CM | POA: Diagnosis not present

## 2017-11-09 DIAGNOSIS — D631 Anemia in chronic kidney disease: Secondary | ICD-10-CM | POA: Diagnosis not present

## 2017-11-09 DIAGNOSIS — Z992 Dependence on renal dialysis: Secondary | ICD-10-CM | POA: Diagnosis not present

## 2017-11-11 DIAGNOSIS — D509 Iron deficiency anemia, unspecified: Secondary | ICD-10-CM | POA: Diagnosis not present

## 2017-11-11 DIAGNOSIS — Z992 Dependence on renal dialysis: Secondary | ICD-10-CM | POA: Diagnosis not present

## 2017-11-11 DIAGNOSIS — N2581 Secondary hyperparathyroidism of renal origin: Secondary | ICD-10-CM | POA: Diagnosis not present

## 2017-11-11 DIAGNOSIS — D631 Anemia in chronic kidney disease: Secondary | ICD-10-CM | POA: Diagnosis not present

## 2017-11-11 DIAGNOSIS — N186 End stage renal disease: Secondary | ICD-10-CM | POA: Diagnosis not present

## 2017-11-14 DIAGNOSIS — Z992 Dependence on renal dialysis: Secondary | ICD-10-CM | POA: Diagnosis not present

## 2017-11-14 DIAGNOSIS — N2581 Secondary hyperparathyroidism of renal origin: Secondary | ICD-10-CM | POA: Diagnosis not present

## 2017-11-14 DIAGNOSIS — N186 End stage renal disease: Secondary | ICD-10-CM | POA: Diagnosis not present

## 2017-11-14 DIAGNOSIS — D509 Iron deficiency anemia, unspecified: Secondary | ICD-10-CM | POA: Diagnosis not present

## 2017-11-14 DIAGNOSIS — D631 Anemia in chronic kidney disease: Secondary | ICD-10-CM | POA: Diagnosis not present

## 2017-11-16 DIAGNOSIS — D509 Iron deficiency anemia, unspecified: Secondary | ICD-10-CM | POA: Diagnosis not present

## 2017-11-16 DIAGNOSIS — N186 End stage renal disease: Secondary | ICD-10-CM | POA: Diagnosis not present

## 2017-11-16 DIAGNOSIS — N2581 Secondary hyperparathyroidism of renal origin: Secondary | ICD-10-CM | POA: Diagnosis not present

## 2017-11-16 DIAGNOSIS — Z992 Dependence on renal dialysis: Secondary | ICD-10-CM | POA: Diagnosis not present

## 2017-11-16 DIAGNOSIS — D631 Anemia in chronic kidney disease: Secondary | ICD-10-CM | POA: Diagnosis not present

## 2017-11-18 DIAGNOSIS — D631 Anemia in chronic kidney disease: Secondary | ICD-10-CM | POA: Diagnosis not present

## 2017-11-18 DIAGNOSIS — N186 End stage renal disease: Secondary | ICD-10-CM | POA: Diagnosis not present

## 2017-11-18 DIAGNOSIS — N2581 Secondary hyperparathyroidism of renal origin: Secondary | ICD-10-CM | POA: Diagnosis not present

## 2017-11-18 DIAGNOSIS — D509 Iron deficiency anemia, unspecified: Secondary | ICD-10-CM | POA: Diagnosis not present

## 2017-11-18 DIAGNOSIS — Z992 Dependence on renal dialysis: Secondary | ICD-10-CM | POA: Diagnosis not present

## 2017-11-21 DIAGNOSIS — N186 End stage renal disease: Secondary | ICD-10-CM | POA: Diagnosis not present

## 2017-11-21 DIAGNOSIS — D631 Anemia in chronic kidney disease: Secondary | ICD-10-CM | POA: Diagnosis not present

## 2017-11-21 DIAGNOSIS — D509 Iron deficiency anemia, unspecified: Secondary | ICD-10-CM | POA: Diagnosis not present

## 2017-11-21 DIAGNOSIS — N2581 Secondary hyperparathyroidism of renal origin: Secondary | ICD-10-CM | POA: Diagnosis not present

## 2017-11-21 DIAGNOSIS — Z992 Dependence on renal dialysis: Secondary | ICD-10-CM | POA: Diagnosis not present

## 2017-11-23 DIAGNOSIS — N186 End stage renal disease: Secondary | ICD-10-CM | POA: Diagnosis not present

## 2017-11-23 DIAGNOSIS — N2581 Secondary hyperparathyroidism of renal origin: Secondary | ICD-10-CM | POA: Diagnosis not present

## 2017-11-23 DIAGNOSIS — D509 Iron deficiency anemia, unspecified: Secondary | ICD-10-CM | POA: Diagnosis not present

## 2017-11-23 DIAGNOSIS — Z992 Dependence on renal dialysis: Secondary | ICD-10-CM | POA: Diagnosis not present

## 2017-11-23 DIAGNOSIS — D631 Anemia in chronic kidney disease: Secondary | ICD-10-CM | POA: Diagnosis not present

## 2017-11-25 DIAGNOSIS — D631 Anemia in chronic kidney disease: Secondary | ICD-10-CM | POA: Diagnosis not present

## 2017-11-25 DIAGNOSIS — N186 End stage renal disease: Secondary | ICD-10-CM | POA: Diagnosis not present

## 2017-11-25 DIAGNOSIS — Z992 Dependence on renal dialysis: Secondary | ICD-10-CM | POA: Diagnosis not present

## 2017-11-25 DIAGNOSIS — N2581 Secondary hyperparathyroidism of renal origin: Secondary | ICD-10-CM | POA: Diagnosis not present

## 2017-11-25 DIAGNOSIS — D509 Iron deficiency anemia, unspecified: Secondary | ICD-10-CM | POA: Diagnosis not present

## 2017-11-28 DIAGNOSIS — N186 End stage renal disease: Secondary | ICD-10-CM | POA: Diagnosis not present

## 2017-11-28 DIAGNOSIS — D509 Iron deficiency anemia, unspecified: Secondary | ICD-10-CM | POA: Diagnosis not present

## 2017-11-28 DIAGNOSIS — N2581 Secondary hyperparathyroidism of renal origin: Secondary | ICD-10-CM | POA: Diagnosis not present

## 2017-11-28 DIAGNOSIS — Z992 Dependence on renal dialysis: Secondary | ICD-10-CM | POA: Diagnosis not present

## 2017-11-28 DIAGNOSIS — D631 Anemia in chronic kidney disease: Secondary | ICD-10-CM | POA: Diagnosis not present

## 2017-11-30 DIAGNOSIS — D631 Anemia in chronic kidney disease: Secondary | ICD-10-CM | POA: Diagnosis not present

## 2017-11-30 DIAGNOSIS — D509 Iron deficiency anemia, unspecified: Secondary | ICD-10-CM | POA: Diagnosis not present

## 2017-11-30 DIAGNOSIS — N2581 Secondary hyperparathyroidism of renal origin: Secondary | ICD-10-CM | POA: Diagnosis not present

## 2017-11-30 DIAGNOSIS — Z992 Dependence on renal dialysis: Secondary | ICD-10-CM | POA: Diagnosis not present

## 2017-11-30 DIAGNOSIS — N186 End stage renal disease: Secondary | ICD-10-CM | POA: Diagnosis not present

## 2017-12-02 DIAGNOSIS — D509 Iron deficiency anemia, unspecified: Secondary | ICD-10-CM | POA: Diagnosis not present

## 2017-12-02 DIAGNOSIS — N2581 Secondary hyperparathyroidism of renal origin: Secondary | ICD-10-CM | POA: Diagnosis not present

## 2017-12-02 DIAGNOSIS — N186 End stage renal disease: Secondary | ICD-10-CM | POA: Diagnosis not present

## 2017-12-02 DIAGNOSIS — D631 Anemia in chronic kidney disease: Secondary | ICD-10-CM | POA: Diagnosis not present

## 2017-12-02 DIAGNOSIS — Z992 Dependence on renal dialysis: Secondary | ICD-10-CM | POA: Diagnosis not present

## 2017-12-05 DIAGNOSIS — N2581 Secondary hyperparathyroidism of renal origin: Secondary | ICD-10-CM | POA: Diagnosis not present

## 2017-12-05 DIAGNOSIS — N186 End stage renal disease: Secondary | ICD-10-CM | POA: Diagnosis not present

## 2017-12-05 DIAGNOSIS — Z992 Dependence on renal dialysis: Secondary | ICD-10-CM | POA: Diagnosis not present

## 2017-12-05 DIAGNOSIS — D631 Anemia in chronic kidney disease: Secondary | ICD-10-CM | POA: Diagnosis not present

## 2017-12-05 DIAGNOSIS — D509 Iron deficiency anemia, unspecified: Secondary | ICD-10-CM | POA: Diagnosis not present

## 2017-12-07 DIAGNOSIS — D509 Iron deficiency anemia, unspecified: Secondary | ICD-10-CM | POA: Diagnosis not present

## 2017-12-07 DIAGNOSIS — N2581 Secondary hyperparathyroidism of renal origin: Secondary | ICD-10-CM | POA: Diagnosis not present

## 2017-12-07 DIAGNOSIS — D631 Anemia in chronic kidney disease: Secondary | ICD-10-CM | POA: Diagnosis not present

## 2017-12-07 DIAGNOSIS — N186 End stage renal disease: Secondary | ICD-10-CM | POA: Diagnosis not present

## 2017-12-07 DIAGNOSIS — Z992 Dependence on renal dialysis: Secondary | ICD-10-CM | POA: Diagnosis not present

## 2017-12-09 DIAGNOSIS — D631 Anemia in chronic kidney disease: Secondary | ICD-10-CM | POA: Diagnosis not present

## 2017-12-09 DIAGNOSIS — N2581 Secondary hyperparathyroidism of renal origin: Secondary | ICD-10-CM | POA: Diagnosis not present

## 2017-12-09 DIAGNOSIS — N186 End stage renal disease: Secondary | ICD-10-CM | POA: Diagnosis not present

## 2017-12-09 DIAGNOSIS — D509 Iron deficiency anemia, unspecified: Secondary | ICD-10-CM | POA: Diagnosis not present

## 2017-12-09 DIAGNOSIS — Z992 Dependence on renal dialysis: Secondary | ICD-10-CM | POA: Diagnosis not present

## 2017-12-12 DIAGNOSIS — N186 End stage renal disease: Secondary | ICD-10-CM | POA: Diagnosis not present

## 2017-12-12 DIAGNOSIS — D509 Iron deficiency anemia, unspecified: Secondary | ICD-10-CM | POA: Diagnosis not present

## 2017-12-12 DIAGNOSIS — D631 Anemia in chronic kidney disease: Secondary | ICD-10-CM | POA: Diagnosis not present

## 2017-12-12 DIAGNOSIS — N2581 Secondary hyperparathyroidism of renal origin: Secondary | ICD-10-CM | POA: Diagnosis not present

## 2017-12-12 DIAGNOSIS — Z992 Dependence on renal dialysis: Secondary | ICD-10-CM | POA: Diagnosis not present

## 2017-12-14 DIAGNOSIS — D631 Anemia in chronic kidney disease: Secondary | ICD-10-CM | POA: Diagnosis not present

## 2017-12-14 DIAGNOSIS — Z992 Dependence on renal dialysis: Secondary | ICD-10-CM | POA: Diagnosis not present

## 2017-12-14 DIAGNOSIS — D509 Iron deficiency anemia, unspecified: Secondary | ICD-10-CM | POA: Diagnosis not present

## 2017-12-14 DIAGNOSIS — N186 End stage renal disease: Secondary | ICD-10-CM | POA: Diagnosis not present

## 2017-12-14 DIAGNOSIS — N2581 Secondary hyperparathyroidism of renal origin: Secondary | ICD-10-CM | POA: Diagnosis not present

## 2017-12-16 DIAGNOSIS — N2581 Secondary hyperparathyroidism of renal origin: Secondary | ICD-10-CM | POA: Diagnosis not present

## 2017-12-16 DIAGNOSIS — D631 Anemia in chronic kidney disease: Secondary | ICD-10-CM | POA: Diagnosis not present

## 2017-12-16 DIAGNOSIS — D509 Iron deficiency anemia, unspecified: Secondary | ICD-10-CM | POA: Diagnosis not present

## 2017-12-16 DIAGNOSIS — N186 End stage renal disease: Secondary | ICD-10-CM | POA: Diagnosis not present

## 2017-12-16 DIAGNOSIS — Z992 Dependence on renal dialysis: Secondary | ICD-10-CM | POA: Diagnosis not present

## 2017-12-19 DIAGNOSIS — Z992 Dependence on renal dialysis: Secondary | ICD-10-CM | POA: Diagnosis not present

## 2017-12-19 DIAGNOSIS — N186 End stage renal disease: Secondary | ICD-10-CM | POA: Diagnosis not present

## 2017-12-19 DIAGNOSIS — D509 Iron deficiency anemia, unspecified: Secondary | ICD-10-CM | POA: Diagnosis not present

## 2017-12-19 DIAGNOSIS — D631 Anemia in chronic kidney disease: Secondary | ICD-10-CM | POA: Diagnosis not present

## 2017-12-19 DIAGNOSIS — N2581 Secondary hyperparathyroidism of renal origin: Secondary | ICD-10-CM | POA: Diagnosis not present

## 2017-12-21 DIAGNOSIS — D509 Iron deficiency anemia, unspecified: Secondary | ICD-10-CM | POA: Diagnosis not present

## 2017-12-21 DIAGNOSIS — D631 Anemia in chronic kidney disease: Secondary | ICD-10-CM | POA: Diagnosis not present

## 2017-12-21 DIAGNOSIS — N186 End stage renal disease: Secondary | ICD-10-CM | POA: Diagnosis not present

## 2017-12-21 DIAGNOSIS — Z992 Dependence on renal dialysis: Secondary | ICD-10-CM | POA: Diagnosis not present

## 2017-12-21 DIAGNOSIS — N2581 Secondary hyperparathyroidism of renal origin: Secondary | ICD-10-CM | POA: Diagnosis not present

## 2017-12-23 DIAGNOSIS — D631 Anemia in chronic kidney disease: Secondary | ICD-10-CM | POA: Diagnosis not present

## 2017-12-23 DIAGNOSIS — N2581 Secondary hyperparathyroidism of renal origin: Secondary | ICD-10-CM | POA: Diagnosis not present

## 2017-12-23 DIAGNOSIS — D509 Iron deficiency anemia, unspecified: Secondary | ICD-10-CM | POA: Diagnosis not present

## 2017-12-23 DIAGNOSIS — Z992 Dependence on renal dialysis: Secondary | ICD-10-CM | POA: Diagnosis not present

## 2017-12-23 DIAGNOSIS — N186 End stage renal disease: Secondary | ICD-10-CM | POA: Diagnosis not present

## 2017-12-26 DIAGNOSIS — Z992 Dependence on renal dialysis: Secondary | ICD-10-CM | POA: Diagnosis not present

## 2017-12-26 DIAGNOSIS — D631 Anemia in chronic kidney disease: Secondary | ICD-10-CM | POA: Diagnosis not present

## 2017-12-26 DIAGNOSIS — D509 Iron deficiency anemia, unspecified: Secondary | ICD-10-CM | POA: Diagnosis not present

## 2017-12-26 DIAGNOSIS — N186 End stage renal disease: Secondary | ICD-10-CM | POA: Diagnosis not present

## 2017-12-26 DIAGNOSIS — N2581 Secondary hyperparathyroidism of renal origin: Secondary | ICD-10-CM | POA: Diagnosis not present

## 2017-12-27 ENCOUNTER — Encounter: Payer: Self-pay | Admitting: Cardiology

## 2017-12-27 ENCOUNTER — Ambulatory Visit (INDEPENDENT_AMBULATORY_CARE_PROVIDER_SITE_OTHER): Payer: Medicare Other | Admitting: Cardiology

## 2017-12-27 VITALS — BP 123/63 | HR 78 | Ht 67.0 in | Wt 181.0 lb

## 2017-12-27 DIAGNOSIS — R002 Palpitations: Secondary | ICD-10-CM

## 2017-12-27 DIAGNOSIS — R011 Cardiac murmur, unspecified: Secondary | ICD-10-CM | POA: Diagnosis not present

## 2017-12-27 DIAGNOSIS — R0609 Other forms of dyspnea: Secondary | ICD-10-CM | POA: Diagnosis not present

## 2017-12-27 MED ORDER — METOPROLOL TARTRATE 25 MG PO TABS: 25.0000 mg | ORAL_TABLET | Freq: Two times a day (BID) | ORAL | 3 refills | Status: DC

## 2017-12-27 NOTE — Patient Instructions (Signed)
Your physician recommends that you schedule a follow-up appointment in:  6 weeks with Dr Harl Bowie    INCREASE  Lopressor 25 mg twice a day    Your physician has requested that you have an echocardiogram. Echocardiography is a painless test that uses sound waves to create images of your heart. It provides your doctor with information about the size and shape of your heart and how well your heart's chambers and valves are working. This procedure takes approximately one hour. There are no restrictions for this procedure.      No labs today     Thank you for choosing [facility] !

## 2017-12-27 NOTE — Progress Notes (Signed)
Clinical Summary [name] is a 84 y.o.male seen as new consult  1. SOB - started in Feb. Mainly with activities. DOE about 30 feet with DOE. Some mild LE edema.  - no chest pain. Some cough that can be productive that is chronic.  - 12/2016 CT chest LM and 3 vessel CAD.     2. ESRD - HD on MWF   3. Lung cancer - history of lobectomy.    4. COPD - he is unsure of this diagnosis though it is mentioned in epic. Has not been inhalers.   5. Palpitations - occurs daily, lasts just a few seconds. More with activity - has only been taking his lopressor once daily   Past Medical History:  Diagnosis Date  . Arthritis    ankle , hip, knees , low back   . BPH (benign prostatic hypertrophy)   . Cancer ([place])    lung  . Cancer ([place]) 2001   renal  . Chronic inflammatory demyelinating polyneuropathy ([place]) 04/07/11   ,diagnosed 1992, tx. /w prednisone x 17 yrs. has been d/c for 3 yrs.   . Chronic kidney disease       . COPD (chronic obstructive pulmonary disease) ([place])    told that he has "a bit of emphysema"  . Dialysis patient ([place])   . GERD (gastroesophageal reflux disease)   . Hypertension    stress test done - many years ago  . Kidney calculus   . Pneumonia      No Known Allergies   Current Outpatient Medications  Medication Sig Dispense Refill  . albuterol (PROVENTIL HFA;VENTOLIN HFA) 108 (90 Base) MCG/ACT inhaler Inhale 1-2 puffs into the lungs every 6 (six) hours as needed for wheezing or shortness of breath.    [name] Kitchen albuterol (PROVENTIL) (2.5 MG/3ML) 0.083% nebulizer solution Take 3 mLs (2.5 mg total) by nebulization every 6 (six) hours as needed for wheezing or shortness of breath. 75 mL 12  . cinacalcet (SENSIPAR) 30 MG tablet Take 30 mg by mouth daily with supper.    . clobetasol cream (TEMOVATE) 6.19 % Apply 1 application topically 2 (two) times daily as needed (itchy ears).    . doxycycline (VIBRA-TABS) 100 MG tablet Take 1 tablet (100 mg total) by mouth  every 12 (twelve) hours. 4 tablet 0  . guaiFENesin (MUCINEX) 600 MG 12 hr tablet Take 1 tablet (600 mg total) by mouth 2 (two) times daily. 20 tablet 0  . lidocaine-prilocaine (EMLA) cream Apply 1 application topically as directed. Apply to access site for dialysis 1 hour prior to visit    . metoprolol succinate (TOPROL-XL) 25 MG 24 hr tablet Take 25 mg by mouth every morning.     . pantoprazole (PROTONIX) 40 MG tablet Take 1 tablet (40 mg total) by mouth daily at 6 (six) AM. 30 tablet 0  . predniSONE (DELTASONE) 10 MG tablet Take 40mg  po daily for 2 days then 30mg  daily for 2 days then 20mg  daily for 2 days then 10mg  daily for 2 days then stop 20 tablet 0  . sevelamer carbonate (RENVELA) 800 MG tablet Take 800-1,600 mg by mouth as directed. Take 2 tablets (1600mg ) three times a day with meals and 1 tablet (800mg ) with a snack     No current facility-administered medications for this visit.      Past Surgical History:  Procedure Laterality Date  . ANKLE FUSION     R ankle  . AV FISTULA PLACEMENT  06/01/2011  Procedure: ARTERIOVENOUS (AV) FISTULA CREATION;  Surgeon: Angelia Mould, MD;  Location: [facility] OR;  Service: Vascular;  Laterality: Left;  Creation of Left Arteriovenous fistula  . AV FISTULA PLACEMENT Left 07/12/2012   Procedure: ARTERIOVENOUS (AV) FISTULA CREATION;  Surgeon: Conrad [place], MD;  Location: [place];  Service: Vascular;  Laterality: Left;  . AV FISTULA PLACEMENT Left 4.11.14  . BASCILIC VEIN TRANSPOSITION Left 08/28/2012   Procedure: BASCILIC VEIN TRANSPOSITION;  Surgeon: Conrad [place], MD;  Location: [place];  Service: Vascular;  Laterality: Left;  left 2nd stage basilic vein transposition  . CHOLECYSTECTOMY    . COLONOSCOPY  2005   Dr. Laural Golden, diverticulosis, hemorrhoids, 2 small polyps  . FISTULOGRAM N/A 08/16/2011   Procedure: FISTULOGRAM;  Surgeon: Angelia Mould, MD;  Location: [facility] CATH LAB;  Service: Cardiovascular;  Laterality: N/A;  . fistulogram left radial  cephalic AV fistula  [id number]     Dr. Scot Dock  . HIP ARTHROPLASTY Right    "partial"  . JOINT REPLACEMENT     L knee- 2010- [facility], L hip partial replacement   . LUNG REMOVAL, PARTIAL  2003   partial, right  . NEPHRECTOMY  2001   left  . SHUNTOGRAM N/A 05/15/2012   Procedure: Earney Mallet;  Surgeon: Conrad [place], MD;  Location: [facility] CATH LAB;  Service: Cardiovascular;  Laterality: N/A;  . SHUNTOGRAM N/A 05/09/2014   Procedure: FISTULOGRAM;  Surgeon: Conrad [place], MD;  Location: [facility] CATH LAB;  Service: Cardiovascular;  Laterality: N/A;     No Known Allergies    Family History  Problem Relation Age of Onset  . Heart disease Mother   . Heart disease Father   . Heart attack Father   . Anesthesia problems Neg Hx   . Hypotension Neg Hx   . Malignant hyperthermia Neg Hx   . Pseudochol deficiency Neg Hx   . Colon cancer Neg Hx      Social History [name] reports that he quit smoking about 40 years ago. His smoking use included cigarettes. He quit after 35.00 years of use. His smokeless tobacco use includes chew. [name] reports that he does not drink alcohol.   Review of Systems CONSTITUTIONAL: No weight loss, fever, chills, weakness or fatigue.  HEENT: Eyes: No visual loss, blurred vision, double vision or yellow sclerae.No hearing loss, sneezing, congestion, runny nose or sore throat.  SKIN: No rash or itching.  CARDIOVASCULAR: per hpi RESPIRATORY: per hpi  GASTROINTESTINAL: No anorexia, nausea, vomiting or diarrhea. No abdominal pain or blood.  GENITOURINARY: No burning on urination, no polyuria NEUROLOGICAL: No headache, dizziness, syncope, paralysis, ataxia, numbness or tingling in the extremities. No change in bowel or bladder control.  MUSCULOSKELETAL: No muscle, back pain, joint pain or stiffness.  LYMPHATICS: No enlarged nodes. No history of splenectomy.  PSYCHIATRIC: No history of depression or anxiety.  ENDOCRINOLOGIC: No reports of sweating, cold or heat  intolerance. No polyuria or polydipsia.  [name] Kitchen   Physical Examination Vitals:   12/27/17 1352  BP: 123/63  Pulse: 78  SpO2: 97%   Vitals:   12/27/17 1352  Weight: 181 lb (82.1 kg)  Height: 5\' 7"  (1.702 m)    Gen: resting comfortably, no acute distress HEENT: no scleral icterus, pupils equal round and reactive, no palptable cervical adenopathy,  CV: RRR, 2/6 systolic murmur at apex no jvd Resp: Clear to auscultation bilaterally GI: abdomen is soft, non-tender, non-distended, normal bowel sounds, no hepatosplenomegaly MSK: extremities are warm, no edema.  Skin:  warm, no rash Neuro:  no focal deficits Psych: appropriate affect     Assessment and Plan  1. DOE - we will obtain echo to evaluate for possible cardiac etiology - pending results would consider exercise nuclear stress test, he did have CAD noted by prior chest CT  2. Heart murmur - obtain echo  3. Palpitations - only taking lopressor once daily, we will change to bid dosing and monitor symptoms - if progress may warrant a home holter.      Arnoldo Lenis, M.D.,

## 2017-12-28 DIAGNOSIS — D509 Iron deficiency anemia, unspecified: Secondary | ICD-10-CM | POA: Diagnosis not present

## 2017-12-28 DIAGNOSIS — N186 End stage renal disease: Secondary | ICD-10-CM | POA: Diagnosis not present

## 2017-12-28 DIAGNOSIS — N2581 Secondary hyperparathyroidism of renal origin: Secondary | ICD-10-CM | POA: Diagnosis not present

## 2017-12-28 DIAGNOSIS — D631 Anemia in chronic kidney disease: Secondary | ICD-10-CM | POA: Diagnosis not present

## 2017-12-28 DIAGNOSIS — Z992 Dependence on renal dialysis: Secondary | ICD-10-CM | POA: Diagnosis not present

## 2017-12-30 DIAGNOSIS — N186 End stage renal disease: Secondary | ICD-10-CM | POA: Diagnosis not present

## 2017-12-30 DIAGNOSIS — D509 Iron deficiency anemia, unspecified: Secondary | ICD-10-CM | POA: Diagnosis not present

## 2017-12-30 DIAGNOSIS — N2581 Secondary hyperparathyroidism of renal origin: Secondary | ICD-10-CM | POA: Diagnosis not present

## 2017-12-30 DIAGNOSIS — Z992 Dependence on renal dialysis: Secondary | ICD-10-CM | POA: Diagnosis not present

## 2017-12-30 DIAGNOSIS — D631 Anemia in chronic kidney disease: Secondary | ICD-10-CM | POA: Diagnosis not present

## 2018-01-02 DIAGNOSIS — Z992 Dependence on renal dialysis: Secondary | ICD-10-CM | POA: Diagnosis not present

## 2018-01-02 DIAGNOSIS — D509 Iron deficiency anemia, unspecified: Secondary | ICD-10-CM | POA: Diagnosis not present

## 2018-01-02 DIAGNOSIS — N2581 Secondary hyperparathyroidism of renal origin: Secondary | ICD-10-CM | POA: Diagnosis not present

## 2018-01-02 DIAGNOSIS — D631 Anemia in chronic kidney disease: Secondary | ICD-10-CM | POA: Diagnosis not present

## 2018-01-02 DIAGNOSIS — N186 End stage renal disease: Secondary | ICD-10-CM | POA: Diagnosis not present

## 2018-01-04 DIAGNOSIS — D509 Iron deficiency anemia, unspecified: Secondary | ICD-10-CM | POA: Diagnosis not present

## 2018-01-04 DIAGNOSIS — D631 Anemia in chronic kidney disease: Secondary | ICD-10-CM | POA: Diagnosis not present

## 2018-01-04 DIAGNOSIS — N2581 Secondary hyperparathyroidism of renal origin: Secondary | ICD-10-CM | POA: Diagnosis not present

## 2018-01-04 DIAGNOSIS — Z992 Dependence on renal dialysis: Secondary | ICD-10-CM | POA: Diagnosis not present

## 2018-01-04 DIAGNOSIS — N186 End stage renal disease: Secondary | ICD-10-CM | POA: Diagnosis not present

## 2018-01-05 ENCOUNTER — Ambulatory Visit (HOSPITAL_COMMUNITY)
Admission: RE | Admit: 2018-01-05 | Discharge: 2018-01-05 | Disposition: A | Discharge status: Home / Self Care | Payer: Medicare Other | Source: Ambulatory Visit | Attending: Cardiology | Admitting: Cardiology

## 2018-01-05 DIAGNOSIS — Z87891 Personal history of nicotine dependence: Secondary | ICD-10-CM | POA: Insufficient documentation

## 2018-01-05 DIAGNOSIS — R0609 Other forms of dyspnea: Secondary | ICD-10-CM

## 2018-01-05 DIAGNOSIS — J449 Chronic obstructive pulmonary disease, unspecified: Secondary | ICD-10-CM | POA: Insufficient documentation

## 2018-01-05 DIAGNOSIS — I080 Rheumatic disorders of both mitral and aortic valves: Secondary | ICD-10-CM | POA: Insufficient documentation

## 2018-01-05 DIAGNOSIS — N186 End stage renal disease: Secondary | ICD-10-CM | POA: Diagnosis not present

## 2018-01-05 DIAGNOSIS — R0602 Shortness of breath: Secondary | ICD-10-CM | POA: Diagnosis not present

## 2018-01-05 DIAGNOSIS — I1311 Hypertensive heart and chronic kidney disease without heart failure, with stage 5 chronic kidney disease, or end stage renal disease: Secondary | ICD-10-CM | POA: Diagnosis not present

## 2018-01-05 NOTE — Progress Notes (Signed)
*  PRELIMINARY RESULTS* Echocardiogram 2D Echocardiogram has been performed.  Leavy Cella 01/05/2018, 9:08 AM

## 2018-01-06 DIAGNOSIS — D509 Iron deficiency anemia, unspecified: Secondary | ICD-10-CM | POA: Diagnosis not present

## 2018-01-06 DIAGNOSIS — N186 End stage renal disease: Secondary | ICD-10-CM | POA: Diagnosis not present

## 2018-01-06 DIAGNOSIS — Z992 Dependence on renal dialysis: Secondary | ICD-10-CM | POA: Diagnosis not present

## 2018-01-06 DIAGNOSIS — N2581 Secondary hyperparathyroidism of renal origin: Secondary | ICD-10-CM | POA: Diagnosis not present

## 2018-01-06 DIAGNOSIS — D631 Anemia in chronic kidney disease: Secondary | ICD-10-CM | POA: Diagnosis not present

## 2018-01-07 DIAGNOSIS — N186 End stage renal disease: Secondary | ICD-10-CM | POA: Diagnosis not present

## 2018-01-07 DIAGNOSIS — Z992 Dependence on renal dialysis: Secondary | ICD-10-CM | POA: Diagnosis not present

## 2018-01-09 DIAGNOSIS — D509 Iron deficiency anemia, unspecified: Secondary | ICD-10-CM | POA: Diagnosis not present

## 2018-01-09 DIAGNOSIS — N2581 Secondary hyperparathyroidism of renal origin: Secondary | ICD-10-CM | POA: Diagnosis not present

## 2018-01-09 DIAGNOSIS — N186 End stage renal disease: Secondary | ICD-10-CM | POA: Diagnosis not present

## 2018-01-09 DIAGNOSIS — D631 Anemia in chronic kidney disease: Secondary | ICD-10-CM | POA: Diagnosis not present

## 2018-01-09 DIAGNOSIS — Z992 Dependence on renal dialysis: Secondary | ICD-10-CM | POA: Diagnosis not present

## 2018-01-11 ENCOUNTER — Other Ambulatory Visit: Payer: Self-pay | Admitting: Cardiovascular Disease

## 2018-01-11 ENCOUNTER — Telehealth: Payer: Self-pay | Admitting: Cardiology

## 2018-01-11 DIAGNOSIS — D509 Iron deficiency anemia, unspecified: Secondary | ICD-10-CM | POA: Diagnosis not present

## 2018-01-11 DIAGNOSIS — Z992 Dependence on renal dialysis: Secondary | ICD-10-CM | POA: Diagnosis not present

## 2018-01-11 DIAGNOSIS — N2581 Secondary hyperparathyroidism of renal origin: Secondary | ICD-10-CM | POA: Diagnosis not present

## 2018-01-11 DIAGNOSIS — N186 End stage renal disease: Secondary | ICD-10-CM | POA: Diagnosis not present

## 2018-01-11 DIAGNOSIS — R0609 Other forms of dyspnea: Principal | ICD-10-CM

## 2018-01-11 DIAGNOSIS — D631 Anemia in chronic kidney disease: Secondary | ICD-10-CM | POA: Diagnosis not present

## 2018-01-11 NOTE — Telephone Encounter (Signed)
[name] Lenis, MD  Bernita Raisin, RN        Echo looks good overall, normal heart pumping function. Nothing that would explains his symptoms. Please order an exercise nuclear stress test, hold lopressor day of test    Zandra Abts MD

## 2018-01-11 NOTE — Telephone Encounter (Signed)
Can you result this patients echo

## 2018-01-11 NOTE — Telephone Encounter (Signed)
Would like results from his echo

## 2018-01-12 ENCOUNTER — Ambulatory Visit: Payer: Self-pay | Admitting: Cardiology

## 2018-01-13 DIAGNOSIS — N2581 Secondary hyperparathyroidism of renal origin: Secondary | ICD-10-CM | POA: Diagnosis not present

## 2018-01-13 DIAGNOSIS — D509 Iron deficiency anemia, unspecified: Secondary | ICD-10-CM | POA: Diagnosis not present

## 2018-01-13 DIAGNOSIS — N186 End stage renal disease: Secondary | ICD-10-CM | POA: Diagnosis not present

## 2018-01-13 DIAGNOSIS — Z992 Dependence on renal dialysis: Secondary | ICD-10-CM | POA: Diagnosis not present

## 2018-01-13 DIAGNOSIS — D631 Anemia in chronic kidney disease: Secondary | ICD-10-CM | POA: Diagnosis not present

## 2018-01-16 DIAGNOSIS — Z992 Dependence on renal dialysis: Secondary | ICD-10-CM | POA: Diagnosis not present

## 2018-01-16 DIAGNOSIS — N186 End stage renal disease: Secondary | ICD-10-CM | POA: Diagnosis not present

## 2018-01-16 DIAGNOSIS — N2581 Secondary hyperparathyroidism of renal origin: Secondary | ICD-10-CM | POA: Diagnosis not present

## 2018-01-16 DIAGNOSIS — D631 Anemia in chronic kidney disease: Secondary | ICD-10-CM | POA: Diagnosis not present

## 2018-01-16 DIAGNOSIS — D509 Iron deficiency anemia, unspecified: Secondary | ICD-10-CM | POA: Diagnosis not present

## 2018-01-17 ENCOUNTER — Ambulatory Visit (HOSPITAL_COMMUNITY): Payer: Medicare Other

## 2018-01-17 ENCOUNTER — Encounter (HOSPITAL_COMMUNITY): Payer: Self-pay

## 2018-01-18 DIAGNOSIS — N186 End stage renal disease: Secondary | ICD-10-CM | POA: Diagnosis not present

## 2018-01-18 DIAGNOSIS — D509 Iron deficiency anemia, unspecified: Secondary | ICD-10-CM | POA: Diagnosis not present

## 2018-01-18 DIAGNOSIS — N2581 Secondary hyperparathyroidism of renal origin: Secondary | ICD-10-CM | POA: Diagnosis not present

## 2018-01-18 DIAGNOSIS — Z992 Dependence on renal dialysis: Secondary | ICD-10-CM | POA: Diagnosis not present

## 2018-01-18 DIAGNOSIS — D631 Anemia in chronic kidney disease: Secondary | ICD-10-CM | POA: Diagnosis not present

## 2018-01-20 DIAGNOSIS — Z992 Dependence on renal dialysis: Secondary | ICD-10-CM | POA: Diagnosis not present

## 2018-01-20 DIAGNOSIS — D631 Anemia in chronic kidney disease: Secondary | ICD-10-CM | POA: Diagnosis not present

## 2018-01-20 DIAGNOSIS — N186 End stage renal disease: Secondary | ICD-10-CM | POA: Diagnosis not present

## 2018-01-20 DIAGNOSIS — D509 Iron deficiency anemia, unspecified: Secondary | ICD-10-CM | POA: Diagnosis not present

## 2018-01-20 DIAGNOSIS — N2581 Secondary hyperparathyroidism of renal origin: Secondary | ICD-10-CM | POA: Diagnosis not present

## 2018-01-23 DIAGNOSIS — Z992 Dependence on renal dialysis: Secondary | ICD-10-CM | POA: Diagnosis not present

## 2018-01-23 DIAGNOSIS — D509 Iron deficiency anemia, unspecified: Secondary | ICD-10-CM | POA: Diagnosis not present

## 2018-01-23 DIAGNOSIS — N186 End stage renal disease: Secondary | ICD-10-CM | POA: Diagnosis not present

## 2018-01-23 DIAGNOSIS — D631 Anemia in chronic kidney disease: Secondary | ICD-10-CM | POA: Diagnosis not present

## 2018-01-23 DIAGNOSIS — N2581 Secondary hyperparathyroidism of renal origin: Secondary | ICD-10-CM | POA: Diagnosis not present

## 2018-01-24 ENCOUNTER — Ambulatory Visit (HOSPITAL_COMMUNITY)
Admission: RE | Admit: 2018-01-24 | Discharge: 2018-01-24 | Disposition: A | Discharge status: Home / Self Care | Payer: Medicare Other | Source: Ambulatory Visit | Attending: Cardiology | Admitting: Cardiology

## 2018-01-24 ENCOUNTER — Encounter (HOSPITAL_COMMUNITY): Payer: Self-pay

## 2018-01-24 ENCOUNTER — Encounter (HOSPITAL_COMMUNITY)
Admission: RE | Admit: 2018-01-24 | Discharge: 2018-01-24 | Disposition: A | Discharge status: Home / Self Care | Payer: Medicare Other | Source: Ambulatory Visit | Attending: Cardiology | Admitting: Cardiology

## 2018-01-24 DIAGNOSIS — R0609 Other forms of dyspnea: Secondary | ICD-10-CM | POA: Diagnosis not present

## 2018-01-24 LAB — NM MYOCAR MULTI W/SPECT W/WALL MOTION / EF
CHL CUP NUCLEAR SDS: 0
LHR: 0.35
LV sys vol: 43 mL
LVDIAVOL: 110 mL (ref 62–150)
Peak HR: 86 {beats}/min
Rest HR: 65 {beats}/min
SRS: 0
SSS: 0
TID: 1.1

## 2018-01-24 MED ORDER — TECHNETIUM TC 99M TETROFOSMIN IV KIT
30.0000 | PACK | Freq: Once | INTRAVENOUS | Status: AC | PRN
  Administered 2018-01-24: 26.6 via INTRAVENOUS

## 2018-01-24 MED ORDER — TECHNETIUM TC 99M TETROFOSMIN IV KIT
10.0000 | PACK | Freq: Once | INTRAVENOUS | Status: AC | PRN
  Administered 2018-01-24: 11 via INTRAVENOUS

## 2018-01-24 MED ORDER — SODIUM CHLORIDE 0.9% FLUSH
INTRAVENOUS | Status: AC
  Administered 2018-01-24: 10 mL via INTRAVENOUS
  Filled 2018-01-24: qty 10

## 2018-01-24 MED ORDER — REGADENOSON 0.4 MG/5ML IV SOLN
INTRAVENOUS | Status: AC
  Administered 2018-01-24: 0.4 mg via INTRAVENOUS
  Filled 2018-01-24: qty 5

## 2018-01-25 ENCOUNTER — Telehealth: Payer: Self-pay

## 2018-01-25 DIAGNOSIS — D631 Anemia in chronic kidney disease: Secondary | ICD-10-CM | POA: Diagnosis not present

## 2018-01-25 DIAGNOSIS — N2581 Secondary hyperparathyroidism of renal origin: Secondary | ICD-10-CM | POA: Diagnosis not present

## 2018-01-25 DIAGNOSIS — N186 End stage renal disease: Secondary | ICD-10-CM | POA: Diagnosis not present

## 2018-01-25 DIAGNOSIS — D509 Iron deficiency anemia, unspecified: Secondary | ICD-10-CM | POA: Diagnosis not present

## 2018-01-25 DIAGNOSIS — Z992 Dependence on renal dialysis: Secondary | ICD-10-CM | POA: Diagnosis not present

## 2018-01-25 NOTE — Telephone Encounter (Signed)
-----   Message from Arnoldo Lenis, MD sent at 01/25/2018  3:06 PM EDT ----- Stress test looks good, no significant blockages. Will discuss further at our f/u   J BrancH MD

## 2018-01-25 NOTE — Telephone Encounter (Signed)
Pt made aware. Copy to pcp.

## 2018-01-27 DIAGNOSIS — D509 Iron deficiency anemia, unspecified: Secondary | ICD-10-CM | POA: Diagnosis not present

## 2018-01-27 DIAGNOSIS — D631 Anemia in chronic kidney disease: Secondary | ICD-10-CM | POA: Diagnosis not present

## 2018-01-27 DIAGNOSIS — N186 End stage renal disease: Secondary | ICD-10-CM | POA: Diagnosis not present

## 2018-01-27 DIAGNOSIS — Z992 Dependence on renal dialysis: Secondary | ICD-10-CM | POA: Diagnosis not present

## 2018-01-27 DIAGNOSIS — N2581 Secondary hyperparathyroidism of renal origin: Secondary | ICD-10-CM | POA: Diagnosis not present

## 2018-01-29 DIAGNOSIS — N186 End stage renal disease: Secondary | ICD-10-CM | POA: Diagnosis not present

## 2018-01-29 DIAGNOSIS — Z992 Dependence on renal dialysis: Secondary | ICD-10-CM | POA: Diagnosis not present

## 2018-01-29 DIAGNOSIS — N2581 Secondary hyperparathyroidism of renal origin: Secondary | ICD-10-CM | POA: Diagnosis not present

## 2018-01-29 DIAGNOSIS — D631 Anemia in chronic kidney disease: Secondary | ICD-10-CM | POA: Diagnosis not present

## 2018-01-29 DIAGNOSIS — D509 Iron deficiency anemia, unspecified: Secondary | ICD-10-CM | POA: Diagnosis not present

## 2018-01-30 DIAGNOSIS — D631 Anemia in chronic kidney disease: Secondary | ICD-10-CM | POA: Diagnosis not present

## 2018-01-30 DIAGNOSIS — R269 Unspecified abnormalities of gait and mobility: Secondary | ICD-10-CM | POA: Diagnosis not present

## 2018-01-30 DIAGNOSIS — N2581 Secondary hyperparathyroidism of renal origin: Secondary | ICD-10-CM | POA: Diagnosis not present

## 2018-01-30 DIAGNOSIS — N186 End stage renal disease: Secondary | ICD-10-CM | POA: Diagnosis not present

## 2018-01-30 DIAGNOSIS — M25551 Pain in right hip: Secondary | ICD-10-CM | POA: Diagnosis not present

## 2018-01-30 DIAGNOSIS — D509 Iron deficiency anemia, unspecified: Secondary | ICD-10-CM | POA: Diagnosis not present

## 2018-01-30 DIAGNOSIS — E663 Overweight: Secondary | ICD-10-CM | POA: Diagnosis not present

## 2018-01-30 DIAGNOSIS — Z6826 Body mass index (BMI) 26.0-26.9, adult: Secondary | ICD-10-CM | POA: Diagnosis not present

## 2018-01-30 DIAGNOSIS — Z1389 Encounter for screening for other disorder: Secondary | ICD-10-CM | POA: Diagnosis not present

## 2018-01-30 DIAGNOSIS — Z992 Dependence on renal dialysis: Secondary | ICD-10-CM | POA: Diagnosis not present

## 2018-02-01 DIAGNOSIS — D631 Anemia in chronic kidney disease: Secondary | ICD-10-CM | POA: Diagnosis not present

## 2018-02-01 DIAGNOSIS — N2581 Secondary hyperparathyroidism of renal origin: Secondary | ICD-10-CM | POA: Diagnosis not present

## 2018-02-01 DIAGNOSIS — N186 End stage renal disease: Secondary | ICD-10-CM | POA: Diagnosis not present

## 2018-02-01 DIAGNOSIS — Z992 Dependence on renal dialysis: Secondary | ICD-10-CM | POA: Diagnosis not present

## 2018-02-01 DIAGNOSIS — D509 Iron deficiency anemia, unspecified: Secondary | ICD-10-CM | POA: Diagnosis not present

## 2018-02-03 DIAGNOSIS — D509 Iron deficiency anemia, unspecified: Secondary | ICD-10-CM | POA: Diagnosis not present

## 2018-02-03 DIAGNOSIS — Z992 Dependence on renal dialysis: Secondary | ICD-10-CM | POA: Diagnosis not present

## 2018-02-03 DIAGNOSIS — N2581 Secondary hyperparathyroidism of renal origin: Secondary | ICD-10-CM | POA: Diagnosis not present

## 2018-02-03 DIAGNOSIS — N186 End stage renal disease: Secondary | ICD-10-CM | POA: Diagnosis not present

## 2018-02-03 DIAGNOSIS — D631 Anemia in chronic kidney disease: Secondary | ICD-10-CM | POA: Diagnosis not present

## 2018-02-06 DIAGNOSIS — Z992 Dependence on renal dialysis: Secondary | ICD-10-CM | POA: Diagnosis not present

## 2018-02-06 DIAGNOSIS — D509 Iron deficiency anemia, unspecified: Secondary | ICD-10-CM | POA: Diagnosis not present

## 2018-02-06 DIAGNOSIS — N186 End stage renal disease: Secondary | ICD-10-CM | POA: Diagnosis not present

## 2018-02-06 DIAGNOSIS — N2581 Secondary hyperparathyroidism of renal origin: Secondary | ICD-10-CM | POA: Diagnosis not present

## 2018-02-06 DIAGNOSIS — D631 Anemia in chronic kidney disease: Secondary | ICD-10-CM | POA: Diagnosis not present

## 2018-02-07 DIAGNOSIS — N2581 Secondary hyperparathyroidism of renal origin: Secondary | ICD-10-CM | POA: Diagnosis not present

## 2018-02-07 DIAGNOSIS — Z992 Dependence on renal dialysis: Secondary | ICD-10-CM | POA: Diagnosis not present

## 2018-02-07 DIAGNOSIS — D631 Anemia in chronic kidney disease: Secondary | ICD-10-CM | POA: Diagnosis not present

## 2018-02-07 DIAGNOSIS — D509 Iron deficiency anemia, unspecified: Secondary | ICD-10-CM | POA: Diagnosis not present

## 2018-02-07 DIAGNOSIS — N186 End stage renal disease: Secondary | ICD-10-CM | POA: Diagnosis not present

## 2018-02-08 DIAGNOSIS — N186 End stage renal disease: Secondary | ICD-10-CM | POA: Diagnosis not present

## 2018-02-08 DIAGNOSIS — Z992 Dependence on renal dialysis: Secondary | ICD-10-CM | POA: Diagnosis not present

## 2018-02-08 DIAGNOSIS — D509 Iron deficiency anemia, unspecified: Secondary | ICD-10-CM | POA: Diagnosis not present

## 2018-02-08 DIAGNOSIS — D631 Anemia in chronic kidney disease: Secondary | ICD-10-CM | POA: Diagnosis not present

## 2018-02-08 DIAGNOSIS — N2581 Secondary hyperparathyroidism of renal origin: Secondary | ICD-10-CM | POA: Diagnosis not present

## 2018-02-10 DIAGNOSIS — D509 Iron deficiency anemia, unspecified: Secondary | ICD-10-CM | POA: Diagnosis not present

## 2018-02-10 DIAGNOSIS — D631 Anemia in chronic kidney disease: Secondary | ICD-10-CM | POA: Diagnosis not present

## 2018-02-10 DIAGNOSIS — N2581 Secondary hyperparathyroidism of renal origin: Secondary | ICD-10-CM | POA: Diagnosis not present

## 2018-02-10 DIAGNOSIS — N186 End stage renal disease: Secondary | ICD-10-CM | POA: Diagnosis not present

## 2018-02-10 DIAGNOSIS — Z992 Dependence on renal dialysis: Secondary | ICD-10-CM | POA: Diagnosis not present

## 2018-02-13 DIAGNOSIS — Z992 Dependence on renal dialysis: Secondary | ICD-10-CM | POA: Diagnosis not present

## 2018-02-13 DIAGNOSIS — D631 Anemia in chronic kidney disease: Secondary | ICD-10-CM | POA: Diagnosis not present

## 2018-02-13 DIAGNOSIS — N2581 Secondary hyperparathyroidism of renal origin: Secondary | ICD-10-CM | POA: Diagnosis not present

## 2018-02-13 DIAGNOSIS — D509 Iron deficiency anemia, unspecified: Secondary | ICD-10-CM | POA: Diagnosis not present

## 2018-02-13 DIAGNOSIS — N186 End stage renal disease: Secondary | ICD-10-CM | POA: Diagnosis not present

## 2018-02-15 DIAGNOSIS — D509 Iron deficiency anemia, unspecified: Secondary | ICD-10-CM | POA: Diagnosis not present

## 2018-02-15 DIAGNOSIS — N186 End stage renal disease: Secondary | ICD-10-CM | POA: Diagnosis not present

## 2018-02-15 DIAGNOSIS — N2581 Secondary hyperparathyroidism of renal origin: Secondary | ICD-10-CM | POA: Diagnosis not present

## 2018-02-15 DIAGNOSIS — Z992 Dependence on renal dialysis: Secondary | ICD-10-CM | POA: Diagnosis not present

## 2018-02-15 DIAGNOSIS — D631 Anemia in chronic kidney disease: Secondary | ICD-10-CM | POA: Diagnosis not present

## 2018-02-16 ENCOUNTER — Ambulatory Visit (INDEPENDENT_AMBULATORY_CARE_PROVIDER_SITE_OTHER): Payer: Medicare Other | Admitting: Cardiology

## 2018-02-16 ENCOUNTER — Encounter: Payer: Self-pay | Admitting: Cardiology

## 2018-02-16 VITALS — BP 128/76 | HR 64 | Ht 67.0 in | Wt 180.0 lb

## 2018-02-16 DIAGNOSIS — R0609 Other forms of dyspnea: Secondary | ICD-10-CM | POA: Diagnosis not present

## 2018-02-16 DIAGNOSIS — R002 Palpitations: Secondary | ICD-10-CM | POA: Diagnosis not present

## 2018-02-16 NOTE — Progress Notes (Signed)
Clinical Summary [name] is a 84 y.o.male  1. SOB - started in Feb. Mainly with activities. DOE about 30 feet with DOE. Some mild LE edema.  - no chest pain. Some cough that can be productive that is chronic.  - 12/2016 CT chest LM and 3 vessel CAD.   12/2017 echo LVEF 66-44%, grade I diastolic dysfunction, mild AI 01/2018 lexiscan nuclear stress: no ischemia - SOB is improving since last visit  2. ESRD - HD on MWF   3. Lung cancer - history of lobectomy.    4. COPD - he is unsure of this diagnosis though it is mentioned in epic. Has not been inhalers.   5. Palpitations - no significant symptoms since last visit.  Past Medical History:  Diagnosis Date  . Arthritis    ankle , hip, knees , low back   . BPH (benign prostatic hypertrophy)   . Cancer ([place])    lung  . Cancer ([place]) 2001   renal  . Chronic inflammatory demyelinating polyneuropathy ([place]) 04/07/11   ,diagnosed 1992, tx. /w prednisone x 17 yrs. has been d/c for 3 yrs.   . Chronic kidney disease       . COPD (chronic obstructive pulmonary disease) ([place])    told that he has "a bit of emphysema"  . Dialysis patient ([place])   . GERD (gastroesophageal reflux disease)   . Hypertension    stress test done - many years ago  . Kidney calculus   . Pneumonia      No Known Allergies   Current Outpatient Medications  Medication Sig Dispense Refill  . clobetasol cream (TEMOVATE) 0.34 % Apply 1 application topically 2 (two) times daily as needed (itchy ears).    . lidocaine-prilocaine (EMLA) cream Apply 1 application topically as directed. Apply to access site for dialysis 1 hour prior to visit    . metoprolol tartrate (LOPRESSOR) 25 MG tablet Take 1 tablet (25 mg total) by mouth 2 (two) times daily. 180 tablet 3  . omeprazole (PRILOSEC) 20 MG capsule Take 20 mg by mouth daily.    . sevelamer carbonate (RENVELA) 800 MG tablet Take 800-1,600 mg by mouth as directed. Take 2 tablets (1600mg ) three times a day  with meals and 1 tablet (800mg ) with a snack     No current facility-administered medications for this visit.      Past Surgical History:  Procedure Laterality Date  . ANKLE FUSION     R ankle  . AV FISTULA PLACEMENT  06/01/2011   Procedure: ARTERIOVENOUS (AV) FISTULA CREATION;  Surgeon: Angelia Mould, MD;  Location: [facility] OR;  Service: Vascular;  Laterality: Left;  Creation of Left Arteriovenous fistula  . AV FISTULA PLACEMENT Left 07/12/2012   Procedure: ARTERIOVENOUS (AV) FISTULA CREATION;  Surgeon: Conrad Neffs, MD;  Location: [place];  Service: Vascular;  Laterality: Left;  . AV FISTULA PLACEMENT Left 4.11.14  . BASCILIC VEIN TRANSPOSITION Left 08/28/2012   Procedure: BASCILIC VEIN TRANSPOSITION;  Surgeon: Conrad Pecan Hill, MD;  Location: [place];  Service: Vascular;  Laterality: Left;  left 2nd stage basilic vein transposition  . CHOLECYSTECTOMY    . COLONOSCOPY  2005   Dr. Laural Golden, diverticulosis, hemorrhoids, 2 small polyps  . FISTULOGRAM N/A 08/16/2011   Procedure: FISTULOGRAM;  Surgeon: Angelia Mould, MD;  Location: [facility] CATH LAB;  Service: Cardiovascular;  Laterality: N/A;  . fistulogram left radial cephalic AV fistula  [id number]     Dr. Scot Dock  .  HIP ARTHROPLASTY Right    "partial"  . JOINT REPLACEMENT     L knee- 2010- [facility], L hip partial replacement   . LUNG REMOVAL, PARTIAL  2003   partial, right  . NEPHRECTOMY  2001   left  . SHUNTOGRAM N/A 05/15/2012   Procedure: Earney Mallet;  Surgeon: Conrad Reader, MD;  Location: [facility] CATH LAB;  Service: Cardiovascular;  Laterality: N/A;  . SHUNTOGRAM N/A 05/09/2014   Procedure: FISTULOGRAM;  Surgeon: Conrad Lodge Pole, MD;  Location: [facility] CATH LAB;  Service: Cardiovascular;  Laterality: N/A;     No Known Allergies    Family History  Problem Relation Age of Onset  . Heart disease Mother   . Heart disease Father   . Heart attack Father   . Anesthesia problems Neg Hx   . Hypotension Neg Hx   . Malignant hyperthermia Neg Hx   .  Pseudochol deficiency Neg Hx   . Colon cancer Neg Hx      Social History [name] reports that he quit smoking about 41 years ago. His smoking use included cigarettes. He quit after 35.00 years of use. His smokeless tobacco use includes chew. [name] reports that he does not drink alcohol.   Review of Systems CONSTITUTIONAL: No weight loss, fever, chills, weakness or fatigue.  HEENT: Eyes: No visual loss, blurred vision, double vision or yellow sclerae.No hearing loss, sneezing, congestion, runny nose or sore throat.  SKIN: No rash or itching.  CARDIOVASCULAR: per hpi RESPIRATORY: per hpi GASTROINTESTINAL: No anorexia, nausea, vomiting or diarrhea. No abdominal pain or blood.  GENITOURINARY: No burning on urination, no polyuria NEUROLOGICAL: No headache, dizziness, syncope, paralysis, ataxia, numbness or tingling in the extremities. No change in bowel or bladder control.  MUSCULOSKELETAL: No muscle, back pain, joint pain or stiffness.  LYMPHATICS: No enlarged nodes. No history of splenectomy.  PSYCHIATRIC: No history of depression or anxiety.  ENDOCRINOLOGIC: No reports of sweating, cold or heat intolerance. No polyuria or polydipsia.  [name] Kitchen   Physical Examination Vitals:   02/16/18 0818  BP: 128/76  Pulse: 64  SpO2: 99%   Vitals:   02/16/18 0818  Weight: 180 lb (81.6 kg)  Height: 5\' 7"  (1.702 m)    Gen: resting comfortably, no acute distress HEENT: no scleral icterus, pupils equal round and reactive, no palptable cervical adenopathy,  CV: RRR, 2/6 systolic murmur rusb, no jvd Resp: Clear to auscultation bilaterally GI: abdomen is soft, non-tender, non-distended, normal bowel sounds, no hepatosplenomegaly MSK: extremities are warm, no edema.  Skin: warm, no rash Neuro:  no focal deficits Psych: appropriate affect   Diagnostic Studies  12/2017 echo Study Conclusions  - Left ventricle: The cavity size was normal. Wall thickness was   increased in a pattern of  moderate LVH. Systolic function was   normal. The estimated ejection fraction was in the range of 55%   to 60%. Wall motion was normal; there were no regional wall   motion abnormalities. Doppler parameters are consistent with   abnormal left ventricular relaxation (grade 1 diastolic   dysfunction). - Aortic valve: There was mild regurgitation. - Mitral valve: Mildly calcified annulus. There was trivial   regurgitation. - Left atrium: The atrium was at the upper limits of normal in   size. - Right atrium: Central venous pressure (est): 3 mm Hg. - Atrial septum: No defect or patent foramen ovale was identified. - Tricuspid valve: There was trivial regurgitation. - Pulmonary arteries: Systolic pressure could not be accurately  estimated. - Pericardium, extracardiac: There was no pericardial effusion.  01/2018 nuclear stress  Mild thinning in the inferior, inferoseptal walls consistent with normal perfusion and soft tissue attenuation. No ischemia or significant scar.  Nuclear stress EF: 61%.  There was no ST segment deviation noted during stress.   Assessment and Plan   1. DOE - essentially negative cardiac workup with echo and stress testing. Likely his symptoms are lung related, would suspect given his smoking history he may have a component of COPD. He is not interested in PFTs at this time - no further cardiac workup  2. Palpitations - symptoms much improved since he his now taking his lopressor bid, continue current meds   F/u as needed. If recurrent SOB would plan for PFTs     Arnoldo Lenis, M.D.

## 2018-02-16 NOTE — Patient Instructions (Signed)
Medication Instructions:  Your physician recommends that you continue on your current medications as directed. Please refer to the Current Medication list given to you today.   Labwork: none  Testing/Procedures: nonen  Follow-Up: Your physician recommends that you schedule a follow-up appointment in: as needed    Any Other Special Instructions Will Be Listed Below (If Applicable).     If you need a refill on your cardiac medications before your next appointment, please call your pharmacy.

## 2018-02-17 DIAGNOSIS — Z992 Dependence on renal dialysis: Secondary | ICD-10-CM | POA: Diagnosis not present

## 2018-02-17 DIAGNOSIS — N186 End stage renal disease: Secondary | ICD-10-CM | POA: Diagnosis not present

## 2018-02-17 DIAGNOSIS — N2581 Secondary hyperparathyroidism of renal origin: Secondary | ICD-10-CM | POA: Diagnosis not present

## 2018-02-17 DIAGNOSIS — D631 Anemia in chronic kidney disease: Secondary | ICD-10-CM | POA: Diagnosis not present

## 2018-02-17 DIAGNOSIS — D509 Iron deficiency anemia, unspecified: Secondary | ICD-10-CM | POA: Diagnosis not present

## 2018-02-20 DIAGNOSIS — N2581 Secondary hyperparathyroidism of renal origin: Secondary | ICD-10-CM | POA: Diagnosis not present

## 2018-02-20 DIAGNOSIS — D509 Iron deficiency anemia, unspecified: Secondary | ICD-10-CM | POA: Diagnosis not present

## 2018-02-20 DIAGNOSIS — N186 End stage renal disease: Secondary | ICD-10-CM | POA: Diagnosis not present

## 2018-02-20 DIAGNOSIS — Z992 Dependence on renal dialysis: Secondary | ICD-10-CM | POA: Diagnosis not present

## 2018-02-20 DIAGNOSIS — D631 Anemia in chronic kidney disease: Secondary | ICD-10-CM | POA: Diagnosis not present

## 2018-02-22 DIAGNOSIS — N186 End stage renal disease: Secondary | ICD-10-CM | POA: Diagnosis not present

## 2018-02-22 DIAGNOSIS — Z992 Dependence on renal dialysis: Secondary | ICD-10-CM | POA: Diagnosis not present

## 2018-02-22 DIAGNOSIS — N2581 Secondary hyperparathyroidism of renal origin: Secondary | ICD-10-CM | POA: Diagnosis not present

## 2018-02-22 DIAGNOSIS — D509 Iron deficiency anemia, unspecified: Secondary | ICD-10-CM | POA: Diagnosis not present

## 2018-02-22 DIAGNOSIS — D631 Anemia in chronic kidney disease: Secondary | ICD-10-CM | POA: Diagnosis not present

## 2018-02-24 DIAGNOSIS — D631 Anemia in chronic kidney disease: Secondary | ICD-10-CM | POA: Diagnosis not present

## 2018-02-24 DIAGNOSIS — N186 End stage renal disease: Secondary | ICD-10-CM | POA: Diagnosis not present

## 2018-02-24 DIAGNOSIS — N2581 Secondary hyperparathyroidism of renal origin: Secondary | ICD-10-CM | POA: Diagnosis not present

## 2018-02-24 DIAGNOSIS — Z992 Dependence on renal dialysis: Secondary | ICD-10-CM | POA: Diagnosis not present

## 2018-02-24 DIAGNOSIS — D509 Iron deficiency anemia, unspecified: Secondary | ICD-10-CM | POA: Diagnosis not present

## 2018-02-27 DIAGNOSIS — N186 End stage renal disease: Secondary | ICD-10-CM | POA: Diagnosis not present

## 2018-02-27 DIAGNOSIS — D509 Iron deficiency anemia, unspecified: Secondary | ICD-10-CM | POA: Diagnosis not present

## 2018-02-27 DIAGNOSIS — D631 Anemia in chronic kidney disease: Secondary | ICD-10-CM | POA: Diagnosis not present

## 2018-02-27 DIAGNOSIS — Z992 Dependence on renal dialysis: Secondary | ICD-10-CM | POA: Diagnosis not present

## 2018-02-27 DIAGNOSIS — N2581 Secondary hyperparathyroidism of renal origin: Secondary | ICD-10-CM | POA: Diagnosis not present

## 2018-02-28 DIAGNOSIS — Z992 Dependence on renal dialysis: Secondary | ICD-10-CM | POA: Diagnosis not present

## 2018-02-28 DIAGNOSIS — D631 Anemia in chronic kidney disease: Secondary | ICD-10-CM | POA: Diagnosis not present

## 2018-02-28 DIAGNOSIS — D509 Iron deficiency anemia, unspecified: Secondary | ICD-10-CM | POA: Diagnosis not present

## 2018-02-28 DIAGNOSIS — N186 End stage renal disease: Secondary | ICD-10-CM | POA: Diagnosis not present

## 2018-02-28 DIAGNOSIS — N2581 Secondary hyperparathyroidism of renal origin: Secondary | ICD-10-CM | POA: Diagnosis not present

## 2018-03-01 DIAGNOSIS — D631 Anemia in chronic kidney disease: Secondary | ICD-10-CM | POA: Diagnosis not present

## 2018-03-01 DIAGNOSIS — N186 End stage renal disease: Secondary | ICD-10-CM | POA: Diagnosis not present

## 2018-03-01 DIAGNOSIS — D509 Iron deficiency anemia, unspecified: Secondary | ICD-10-CM | POA: Diagnosis not present

## 2018-03-01 DIAGNOSIS — Z992 Dependence on renal dialysis: Secondary | ICD-10-CM | POA: Diagnosis not present

## 2018-03-01 DIAGNOSIS — N2581 Secondary hyperparathyroidism of renal origin: Secondary | ICD-10-CM | POA: Diagnosis not present

## 2018-03-03 DIAGNOSIS — Z992 Dependence on renal dialysis: Secondary | ICD-10-CM | POA: Diagnosis not present

## 2018-03-03 DIAGNOSIS — N186 End stage renal disease: Secondary | ICD-10-CM | POA: Diagnosis not present

## 2018-03-03 DIAGNOSIS — D509 Iron deficiency anemia, unspecified: Secondary | ICD-10-CM | POA: Diagnosis not present

## 2018-03-03 DIAGNOSIS — N2581 Secondary hyperparathyroidism of renal origin: Secondary | ICD-10-CM | POA: Diagnosis not present

## 2018-03-03 DIAGNOSIS — D631 Anemia in chronic kidney disease: Secondary | ICD-10-CM | POA: Diagnosis not present

## 2018-03-06 DIAGNOSIS — Z992 Dependence on renal dialysis: Secondary | ICD-10-CM | POA: Diagnosis not present

## 2018-03-06 DIAGNOSIS — D631 Anemia in chronic kidney disease: Secondary | ICD-10-CM | POA: Diagnosis not present

## 2018-03-06 DIAGNOSIS — D509 Iron deficiency anemia, unspecified: Secondary | ICD-10-CM | POA: Diagnosis not present

## 2018-03-06 DIAGNOSIS — N186 End stage renal disease: Secondary | ICD-10-CM | POA: Diagnosis not present

## 2018-03-06 DIAGNOSIS — N2581 Secondary hyperparathyroidism of renal origin: Secondary | ICD-10-CM | POA: Diagnosis not present

## 2018-03-08 DIAGNOSIS — N2581 Secondary hyperparathyroidism of renal origin: Secondary | ICD-10-CM | POA: Diagnosis not present

## 2018-03-08 DIAGNOSIS — Z992 Dependence on renal dialysis: Secondary | ICD-10-CM | POA: Diagnosis not present

## 2018-03-08 DIAGNOSIS — N186 End stage renal disease: Secondary | ICD-10-CM | POA: Diagnosis not present

## 2018-03-08 DIAGNOSIS — D631 Anemia in chronic kidney disease: Secondary | ICD-10-CM | POA: Diagnosis not present

## 2018-03-08 DIAGNOSIS — D509 Iron deficiency anemia, unspecified: Secondary | ICD-10-CM | POA: Diagnosis not present

## 2018-03-09 DIAGNOSIS — N186 End stage renal disease: Secondary | ICD-10-CM | POA: Diagnosis not present

## 2018-03-09 DIAGNOSIS — Z992 Dependence on renal dialysis: Secondary | ICD-10-CM | POA: Diagnosis not present

## 2018-03-10 DIAGNOSIS — D631 Anemia in chronic kidney disease: Secondary | ICD-10-CM | POA: Diagnosis not present

## 2018-03-10 DIAGNOSIS — D509 Iron deficiency anemia, unspecified: Secondary | ICD-10-CM | POA: Diagnosis not present

## 2018-03-10 DIAGNOSIS — N2581 Secondary hyperparathyroidism of renal origin: Secondary | ICD-10-CM | POA: Diagnosis not present

## 2018-03-10 DIAGNOSIS — N186 End stage renal disease: Secondary | ICD-10-CM | POA: Diagnosis not present

## 2018-03-10 DIAGNOSIS — Z992 Dependence on renal dialysis: Secondary | ICD-10-CM | POA: Diagnosis not present

## 2018-03-13 DIAGNOSIS — N186 End stage renal disease: Secondary | ICD-10-CM | POA: Diagnosis not present

## 2018-03-13 DIAGNOSIS — D631 Anemia in chronic kidney disease: Secondary | ICD-10-CM | POA: Diagnosis not present

## 2018-03-13 DIAGNOSIS — D509 Iron deficiency anemia, unspecified: Secondary | ICD-10-CM | POA: Diagnosis not present

## 2018-03-13 DIAGNOSIS — N2581 Secondary hyperparathyroidism of renal origin: Secondary | ICD-10-CM | POA: Diagnosis not present

## 2018-03-13 DIAGNOSIS — Z992 Dependence on renal dialysis: Secondary | ICD-10-CM | POA: Diagnosis not present

## 2018-03-15 DIAGNOSIS — D509 Iron deficiency anemia, unspecified: Secondary | ICD-10-CM | POA: Diagnosis not present

## 2018-03-15 DIAGNOSIS — Z992 Dependence on renal dialysis: Secondary | ICD-10-CM | POA: Diagnosis not present

## 2018-03-15 DIAGNOSIS — N186 End stage renal disease: Secondary | ICD-10-CM | POA: Diagnosis not present

## 2018-03-15 DIAGNOSIS — D631 Anemia in chronic kidney disease: Secondary | ICD-10-CM | POA: Diagnosis not present

## 2018-03-15 DIAGNOSIS — N2581 Secondary hyperparathyroidism of renal origin: Secondary | ICD-10-CM | POA: Diagnosis not present

## 2018-03-17 DIAGNOSIS — N186 End stage renal disease: Secondary | ICD-10-CM | POA: Diagnosis not present

## 2018-03-17 DIAGNOSIS — D509 Iron deficiency anemia, unspecified: Secondary | ICD-10-CM | POA: Diagnosis not present

## 2018-03-17 DIAGNOSIS — D631 Anemia in chronic kidney disease: Secondary | ICD-10-CM | POA: Diagnosis not present

## 2018-03-17 DIAGNOSIS — Z992 Dependence on renal dialysis: Secondary | ICD-10-CM | POA: Diagnosis not present

## 2018-03-17 DIAGNOSIS — N2581 Secondary hyperparathyroidism of renal origin: Secondary | ICD-10-CM | POA: Diagnosis not present

## 2018-03-20 DIAGNOSIS — D509 Iron deficiency anemia, unspecified: Secondary | ICD-10-CM | POA: Diagnosis not present

## 2018-03-20 DIAGNOSIS — N186 End stage renal disease: Secondary | ICD-10-CM | POA: Diagnosis not present

## 2018-03-20 DIAGNOSIS — Z992 Dependence on renal dialysis: Secondary | ICD-10-CM | POA: Diagnosis not present

## 2018-03-20 DIAGNOSIS — N2581 Secondary hyperparathyroidism of renal origin: Secondary | ICD-10-CM | POA: Diagnosis not present

## 2018-03-20 DIAGNOSIS — D631 Anemia in chronic kidney disease: Secondary | ICD-10-CM | POA: Diagnosis not present

## 2018-03-22 DIAGNOSIS — Z992 Dependence on renal dialysis: Secondary | ICD-10-CM | POA: Diagnosis not present

## 2018-03-22 DIAGNOSIS — D509 Iron deficiency anemia, unspecified: Secondary | ICD-10-CM | POA: Diagnosis not present

## 2018-03-22 DIAGNOSIS — N186 End stage renal disease: Secondary | ICD-10-CM | POA: Diagnosis not present

## 2018-03-22 DIAGNOSIS — D631 Anemia in chronic kidney disease: Secondary | ICD-10-CM | POA: Diagnosis not present

## 2018-03-22 DIAGNOSIS — N2581 Secondary hyperparathyroidism of renal origin: Secondary | ICD-10-CM | POA: Diagnosis not present

## 2018-03-24 DIAGNOSIS — Z992 Dependence on renal dialysis: Secondary | ICD-10-CM | POA: Diagnosis not present

## 2018-03-24 DIAGNOSIS — N186 End stage renal disease: Secondary | ICD-10-CM | POA: Diagnosis not present

## 2018-03-24 DIAGNOSIS — D509 Iron deficiency anemia, unspecified: Secondary | ICD-10-CM | POA: Diagnosis not present

## 2018-03-24 DIAGNOSIS — N2581 Secondary hyperparathyroidism of renal origin: Secondary | ICD-10-CM | POA: Diagnosis not present

## 2018-03-24 DIAGNOSIS — D631 Anemia in chronic kidney disease: Secondary | ICD-10-CM | POA: Diagnosis not present

## 2018-03-27 DIAGNOSIS — Z992 Dependence on renal dialysis: Secondary | ICD-10-CM | POA: Diagnosis not present

## 2018-03-27 DIAGNOSIS — D631 Anemia in chronic kidney disease: Secondary | ICD-10-CM | POA: Diagnosis not present

## 2018-03-27 DIAGNOSIS — N186 End stage renal disease: Secondary | ICD-10-CM | POA: Diagnosis not present

## 2018-03-27 DIAGNOSIS — N2581 Secondary hyperparathyroidism of renal origin: Secondary | ICD-10-CM | POA: Diagnosis not present

## 2018-03-27 DIAGNOSIS — D509 Iron deficiency anemia, unspecified: Secondary | ICD-10-CM | POA: Diagnosis not present

## 2018-03-29 DIAGNOSIS — D631 Anemia in chronic kidney disease: Secondary | ICD-10-CM | POA: Diagnosis not present

## 2018-03-29 DIAGNOSIS — D509 Iron deficiency anemia, unspecified: Secondary | ICD-10-CM | POA: Diagnosis not present

## 2018-03-29 DIAGNOSIS — Z992 Dependence on renal dialysis: Secondary | ICD-10-CM | POA: Diagnosis not present

## 2018-03-29 DIAGNOSIS — N186 End stage renal disease: Secondary | ICD-10-CM | POA: Diagnosis not present

## 2018-03-29 DIAGNOSIS — N2581 Secondary hyperparathyroidism of renal origin: Secondary | ICD-10-CM | POA: Diagnosis not present

## 2018-03-30 DIAGNOSIS — D631 Anemia in chronic kidney disease: Secondary | ICD-10-CM | POA: Diagnosis not present

## 2018-03-30 DIAGNOSIS — Z992 Dependence on renal dialysis: Secondary | ICD-10-CM | POA: Diagnosis not present

## 2018-03-30 DIAGNOSIS — N2581 Secondary hyperparathyroidism of renal origin: Secondary | ICD-10-CM | POA: Diagnosis not present

## 2018-03-30 DIAGNOSIS — N186 End stage renal disease: Secondary | ICD-10-CM | POA: Diagnosis not present

## 2018-03-30 DIAGNOSIS — D509 Iron deficiency anemia, unspecified: Secondary | ICD-10-CM | POA: Diagnosis not present

## 2018-03-31 DIAGNOSIS — N186 End stage renal disease: Secondary | ICD-10-CM | POA: Diagnosis not present

## 2018-03-31 DIAGNOSIS — D509 Iron deficiency anemia, unspecified: Secondary | ICD-10-CM | POA: Diagnosis not present

## 2018-03-31 DIAGNOSIS — D631 Anemia in chronic kidney disease: Secondary | ICD-10-CM | POA: Diagnosis not present

## 2018-03-31 DIAGNOSIS — N2581 Secondary hyperparathyroidism of renal origin: Secondary | ICD-10-CM | POA: Diagnosis not present

## 2018-03-31 DIAGNOSIS — Z992 Dependence on renal dialysis: Secondary | ICD-10-CM | POA: Diagnosis not present

## 2018-04-03 DIAGNOSIS — D631 Anemia in chronic kidney disease: Secondary | ICD-10-CM | POA: Diagnosis not present

## 2018-04-03 DIAGNOSIS — D509 Iron deficiency anemia, unspecified: Secondary | ICD-10-CM | POA: Diagnosis not present

## 2018-04-03 DIAGNOSIS — N186 End stage renal disease: Secondary | ICD-10-CM | POA: Diagnosis not present

## 2018-04-03 DIAGNOSIS — Z992 Dependence on renal dialysis: Secondary | ICD-10-CM | POA: Diagnosis not present

## 2018-04-03 DIAGNOSIS — N2581 Secondary hyperparathyroidism of renal origin: Secondary | ICD-10-CM | POA: Diagnosis not present

## 2018-04-04 ENCOUNTER — Other Ambulatory Visit: Payer: Self-pay

## 2018-04-04 DIAGNOSIS — M7989 Other specified soft tissue disorders: Secondary | ICD-10-CM

## 2018-04-04 DIAGNOSIS — N186 End stage renal disease: Secondary | ICD-10-CM

## 2018-04-05 DIAGNOSIS — D509 Iron deficiency anemia, unspecified: Secondary | ICD-10-CM | POA: Diagnosis not present

## 2018-04-05 DIAGNOSIS — Z992 Dependence on renal dialysis: Secondary | ICD-10-CM | POA: Diagnosis not present

## 2018-04-05 DIAGNOSIS — D631 Anemia in chronic kidney disease: Secondary | ICD-10-CM | POA: Diagnosis not present

## 2018-04-05 DIAGNOSIS — N186 End stage renal disease: Secondary | ICD-10-CM | POA: Diagnosis not present

## 2018-04-05 DIAGNOSIS — N2581 Secondary hyperparathyroidism of renal origin: Secondary | ICD-10-CM | POA: Diagnosis not present

## 2018-04-07 DIAGNOSIS — N186 End stage renal disease: Secondary | ICD-10-CM | POA: Diagnosis not present

## 2018-04-07 DIAGNOSIS — D631 Anemia in chronic kidney disease: Secondary | ICD-10-CM | POA: Diagnosis not present

## 2018-04-07 DIAGNOSIS — D509 Iron deficiency anemia, unspecified: Secondary | ICD-10-CM | POA: Diagnosis not present

## 2018-04-07 DIAGNOSIS — N2581 Secondary hyperparathyroidism of renal origin: Secondary | ICD-10-CM | POA: Diagnosis not present

## 2018-04-07 DIAGNOSIS — Z992 Dependence on renal dialysis: Secondary | ICD-10-CM | POA: Diagnosis not present

## 2018-04-08 DIAGNOSIS — Z992 Dependence on renal dialysis: Secondary | ICD-10-CM | POA: Diagnosis not present

## 2018-04-08 DIAGNOSIS — N186 End stage renal disease: Secondary | ICD-10-CM | POA: Diagnosis not present

## 2018-04-09 DIAGNOSIS — N2581 Secondary hyperparathyroidism of renal origin: Secondary | ICD-10-CM | POA: Diagnosis not present

## 2018-04-09 DIAGNOSIS — Z992 Dependence on renal dialysis: Secondary | ICD-10-CM | POA: Diagnosis not present

## 2018-04-09 DIAGNOSIS — N186 End stage renal disease: Secondary | ICD-10-CM | POA: Diagnosis not present

## 2018-04-09 DIAGNOSIS — D631 Anemia in chronic kidney disease: Secondary | ICD-10-CM | POA: Diagnosis not present

## 2018-04-09 DIAGNOSIS — D509 Iron deficiency anemia, unspecified: Secondary | ICD-10-CM | POA: Diagnosis not present

## 2018-04-10 DIAGNOSIS — N186 End stage renal disease: Secondary | ICD-10-CM | POA: Diagnosis not present

## 2018-04-10 DIAGNOSIS — D631 Anemia in chronic kidney disease: Secondary | ICD-10-CM | POA: Diagnosis not present

## 2018-04-10 DIAGNOSIS — D509 Iron deficiency anemia, unspecified: Secondary | ICD-10-CM | POA: Diagnosis not present

## 2018-04-10 DIAGNOSIS — Z992 Dependence on renal dialysis: Secondary | ICD-10-CM | POA: Diagnosis not present

## 2018-04-10 DIAGNOSIS — N2581 Secondary hyperparathyroidism of renal origin: Secondary | ICD-10-CM | POA: Diagnosis not present

## 2018-04-12 DIAGNOSIS — Z992 Dependence on renal dialysis: Secondary | ICD-10-CM | POA: Diagnosis not present

## 2018-04-12 DIAGNOSIS — N2581 Secondary hyperparathyroidism of renal origin: Secondary | ICD-10-CM | POA: Diagnosis not present

## 2018-04-12 DIAGNOSIS — N186 End stage renal disease: Secondary | ICD-10-CM | POA: Diagnosis not present

## 2018-04-12 DIAGNOSIS — D509 Iron deficiency anemia, unspecified: Secondary | ICD-10-CM | POA: Diagnosis not present

## 2018-04-12 DIAGNOSIS — D631 Anemia in chronic kidney disease: Secondary | ICD-10-CM | POA: Diagnosis not present

## 2018-04-14 DIAGNOSIS — D631 Anemia in chronic kidney disease: Secondary | ICD-10-CM | POA: Diagnosis not present

## 2018-04-14 DIAGNOSIS — D509 Iron deficiency anemia, unspecified: Secondary | ICD-10-CM | POA: Diagnosis not present

## 2018-04-14 DIAGNOSIS — N186 End stage renal disease: Secondary | ICD-10-CM | POA: Diagnosis not present

## 2018-04-14 DIAGNOSIS — Z992 Dependence on renal dialysis: Secondary | ICD-10-CM | POA: Diagnosis not present

## 2018-04-14 DIAGNOSIS — N2581 Secondary hyperparathyroidism of renal origin: Secondary | ICD-10-CM | POA: Diagnosis not present

## 2018-04-17 DIAGNOSIS — D631 Anemia in chronic kidney disease: Secondary | ICD-10-CM | POA: Diagnosis not present

## 2018-04-17 DIAGNOSIS — N2581 Secondary hyperparathyroidism of renal origin: Secondary | ICD-10-CM | POA: Diagnosis not present

## 2018-04-17 DIAGNOSIS — D509 Iron deficiency anemia, unspecified: Secondary | ICD-10-CM | POA: Diagnosis not present

## 2018-04-17 DIAGNOSIS — Z992 Dependence on renal dialysis: Secondary | ICD-10-CM | POA: Diagnosis not present

## 2018-04-17 DIAGNOSIS — N186 End stage renal disease: Secondary | ICD-10-CM | POA: Diagnosis not present

## 2018-04-18 ENCOUNTER — Other Ambulatory Visit: Payer: Self-pay

## 2018-04-18 ENCOUNTER — Encounter (HOSPITAL_COMMUNITY): Payer: Self-pay

## 2018-04-18 ENCOUNTER — Ambulatory Visit (HOSPITAL_COMMUNITY)
Admission: RE | Admit: 2018-04-18 | Discharge: 2018-04-18 | Disposition: A | Discharge status: Home / Self Care | Payer: Medicare Other | Source: Ambulatory Visit | Attending: Vascular Surgery | Admitting: Vascular Surgery

## 2018-04-18 ENCOUNTER — Encounter: Payer: Self-pay | Admitting: Vascular Surgery

## 2018-04-18 ENCOUNTER — Ambulatory Visit (INDEPENDENT_AMBULATORY_CARE_PROVIDER_SITE_OTHER): Payer: Medicare Other | Admitting: Vascular Surgery

## 2018-04-18 VITALS — BP 183/83 | HR 65 | Temp 97.0°F | Resp 18 | Ht 67.0 in | Wt 180.0 lb

## 2018-04-18 DIAGNOSIS — N186 End stage renal disease: Secondary | ICD-10-CM | POA: Diagnosis not present

## 2018-04-18 DIAGNOSIS — M7989 Other specified soft tissue disorders: Secondary | ICD-10-CM | POA: Diagnosis not present

## 2018-04-18 NOTE — Progress Notes (Signed)
Vascular and Vein Specialist of [facility]  Patient name: [name] MRN: [id number] DOB: Dec 24, 1930 Sex: male  REASON FOR CONSULT: Valuation left upper arm AV fistula  HPI: [name] is a 84 y.o. male, who is here today for evaluation of left upper arm AV fistula.  He had left basilic vein transposition fistula placed in 2014.  He has had excellent long-term use of this.  He reports a recent episode of what sounds like a significant infiltration in his upper arm fistula with bruising and some swelling at the site in his upper arm and also some swelling extending down into his wrist and hand.  He reports that this swelling has resolved.  He does report some chronic mild swelling in his left wrist and this is not causing him any difficulty.  He reports that he is having no difficulty as far as accessing of his fistula and fistula flow.  Past Medical History:  Diagnosis Date  . Arthritis    ankle , hip, knees , low back   . BPH (benign prostatic hypertrophy)   . Cancer ([place])    lung  . Cancer ([place]) 2001   renal  . Chronic inflammatory demyelinating polyneuropathy ([place]) 04/07/11   ,diagnosed 1992, tx. /w prednisone x 17 yrs. has been d/c for 3 yrs.   . Chronic kidney disease       . COPD (chronic obstructive pulmonary disease) ([place])    told that he has "a bit of emphysema"  . Dialysis patient ([place])   . GERD (gastroesophageal reflux disease)   . Hypertension    stress test done - many years ago  . Kidney calculus   . Pneumonia     Family History  Problem Relation Age of Onset  . Heart disease Mother   . Heart disease Father   . Heart attack Father   . Anesthesia problems Neg Hx   . Hypotension Neg Hx   . Malignant hyperthermia Neg Hx   . Pseudochol deficiency Neg Hx   . Colon cancer Neg Hx     SOCIAL HISTORY: Social History   Socioeconomic History  . Marital status: Married    Spouse name: Not on file  . Number of children:  Not on file  . Years of education: Not on file  . Highest education level: Not on file  Occupational History  . Not on file  Social Needs  . Financial resource strain: Not on file  . Food insecurity:    Worry: Not on file    Inability: Not on file  . Transportation needs:    Medical: Not on file    Non-medical: Not on file  Tobacco Use  . Smoking status: Former Smoker    Years: 35.00    Types: Cigarettes    Last attempt to quit: 02/04/1977    Years since quitting: 41.2  . Smokeless tobacco: Current User    Types: Chew  . Tobacco comment: Quit in 1978  Substance and Sexual Activity  . Alcohol use: No    Alcohol/week: 0.0 standard drinks  . Drug use: No  . Sexual activity: Not on file  Lifestyle  . Physical activity:    Days per week: Not on file    Minutes per session: Not on file  . Stress: Not on file  Relationships  . Social connections:    Talks on phone: Not on file    Gets together: Not on file    Attends religious service:  Not on file    Active member of club or organization: Not on file    Attends meetings of clubs or organizations: Not on file    Relationship status: Not on file  . Intimate partner violence:    Fear of current or ex partner: Not on file    Emotionally abused: Not on file    Physically abused: Not on file    Forced sexual activity: Not on file  Other Topics Concern  . Not on file  Social History Narrative  . Not on file    No Known Allergies  Current Outpatient Medications  Medication Sig Dispense Refill  . lidocaine-prilocaine (EMLA) cream Apply 1 application topically as directed. Apply to access site for dialysis 1 hour prior to visit    . metoprolol tartrate (LOPRESSOR) 25 MG tablet Take 1 tablet (25 mg total) by mouth 2 (two) times daily. 180 tablet 3  . sevelamer carbonate (RENVELA) 800 MG tablet Take 800-1,600 mg by mouth as directed. Take 2 tablets (1600mg ) three times a day with meals and 1 tablet (800mg ) with a snack    .  clobetasol cream (TEMOVATE) 3.87 % Apply 1 application topically 2 (two) times daily as needed (itchy ears).    [name] Kitchen HYDROcodone-acetaminophen (NORCO/VICODIN) 5-325 MG tablet     . omeprazole (PRILOSEC) 20 MG capsule Take 20 mg by mouth daily.     No current facility-administered medications for this visit.     REVIEW OF SYSTEMS:  [X]  denotes positive finding, [ ]  denotes negative finding Cardiac  Comments:  Chest pain or chest pressure:    Shortness of breath upon exertion: x   Short of breath when lying flat:    Irregular heart rhythm:        Vascular    Pain in calf, thigh, or hip brought on by ambulation:    Pain in feet at night that wakes you up from your sleep:     Blood clot in your veins:    Leg swelling:  x       Pulmonary    Oxygen at home:    Productive cough:     Wheezing:         Neurologic    Sudden weakness in arms or legs:     Sudden numbness in arms or legs:     Sudden onset of difficulty speaking or slurred speech:    Temporary loss of vision in one eye:     Problems with dizziness:         Gastrointestinal    Blood in stool:     Vomited blood:         Genitourinary    Burning when urinating:     Blood in urine:        Psychiatric    Major depression:         Hematologic    Bleeding problems:    Problems with blood clotting too easily:        Skin    Rashes or ulcers:        Constitutional    Fever or chills:      PHYSICAL EXAM: Vitals:   04/18/18 1448  BP: (!) 183/83  Pulse: 65  Resp: 18  Temp: (!) 97 F (36.1 C)  TempSrc: Oral  SpO2: 99%  Weight: 180 lb (81.6 kg)  Height: 5\' 7"  (1.702 m)    GENERAL: The patient is a well-nourished male, in no acute distress. The vital signs are  documented above. CARDIOVASCULAR: Thrill in his left upper arm AV fistula with no evidence of skin breakdown.  Normal left radial pulse.  No significant swelling in his left arm versus his right arm PULMONARY: There is good air exchange  ABDOMEN: Soft  and non-tender  MUSCULOSKELETAL: There are no major deformities or cyanosis. NEUROLOGIC: No focal weakness or paresthesias are detected. SKIN: There are no ulcers or rashes noted. PSYCHIATRIC: The patient has a normal affect.  DATA:  Duplex today shows very nice diameter of his fistula throughout its course and evidence of stenosis.  MEDICAL ISSUES: Excellent long-term result of his left upper arm basilic vein fistula.  Recommend continued use of this.  He is not having any arm swelling that would suggest central venous stenosis.  Would not recommend fistulogram or other intervention unless he develops more swelling or difficulty with access flow   Rosetta Posner, MD [facility] Vascular and Vein Specialists of [facility] Tel [phone] Pager [phone]

## 2018-04-19 DIAGNOSIS — D509 Iron deficiency anemia, unspecified: Secondary | ICD-10-CM | POA: Diagnosis not present

## 2018-04-19 DIAGNOSIS — D631 Anemia in chronic kidney disease: Secondary | ICD-10-CM | POA: Diagnosis not present

## 2018-04-19 DIAGNOSIS — Z992 Dependence on renal dialysis: Secondary | ICD-10-CM | POA: Diagnosis not present

## 2018-04-19 DIAGNOSIS — N2581 Secondary hyperparathyroidism of renal origin: Secondary | ICD-10-CM | POA: Diagnosis not present

## 2018-04-19 DIAGNOSIS — N186 End stage renal disease: Secondary | ICD-10-CM | POA: Diagnosis not present

## 2018-04-21 DIAGNOSIS — D631 Anemia in chronic kidney disease: Secondary | ICD-10-CM | POA: Diagnosis not present

## 2018-04-21 DIAGNOSIS — N186 End stage renal disease: Secondary | ICD-10-CM | POA: Diagnosis not present

## 2018-04-21 DIAGNOSIS — Z992 Dependence on renal dialysis: Secondary | ICD-10-CM | POA: Diagnosis not present

## 2018-04-21 DIAGNOSIS — N2581 Secondary hyperparathyroidism of renal origin: Secondary | ICD-10-CM | POA: Diagnosis not present

## 2018-04-21 DIAGNOSIS — D509 Iron deficiency anemia, unspecified: Secondary | ICD-10-CM | POA: Diagnosis not present

## 2018-04-24 DIAGNOSIS — Z992 Dependence on renal dialysis: Secondary | ICD-10-CM | POA: Diagnosis not present

## 2018-04-24 DIAGNOSIS — D509 Iron deficiency anemia, unspecified: Secondary | ICD-10-CM | POA: Diagnosis not present

## 2018-04-24 DIAGNOSIS — N186 End stage renal disease: Secondary | ICD-10-CM | POA: Diagnosis not present

## 2018-04-24 DIAGNOSIS — N2581 Secondary hyperparathyroidism of renal origin: Secondary | ICD-10-CM | POA: Diagnosis not present

## 2018-04-24 DIAGNOSIS — D631 Anemia in chronic kidney disease: Secondary | ICD-10-CM | POA: Diagnosis not present

## 2018-04-26 DIAGNOSIS — Z992 Dependence on renal dialysis: Secondary | ICD-10-CM | POA: Diagnosis not present

## 2018-04-26 DIAGNOSIS — D509 Iron deficiency anemia, unspecified: Secondary | ICD-10-CM | POA: Diagnosis not present

## 2018-04-26 DIAGNOSIS — N186 End stage renal disease: Secondary | ICD-10-CM | POA: Diagnosis not present

## 2018-04-26 DIAGNOSIS — D631 Anemia in chronic kidney disease: Secondary | ICD-10-CM | POA: Diagnosis not present

## 2018-04-26 DIAGNOSIS — N2581 Secondary hyperparathyroidism of renal origin: Secondary | ICD-10-CM | POA: Diagnosis not present

## 2018-04-28 DIAGNOSIS — N2581 Secondary hyperparathyroidism of renal origin: Secondary | ICD-10-CM | POA: Diagnosis not present

## 2018-04-28 DIAGNOSIS — Z992 Dependence on renal dialysis: Secondary | ICD-10-CM | POA: Diagnosis not present

## 2018-04-28 DIAGNOSIS — D631 Anemia in chronic kidney disease: Secondary | ICD-10-CM | POA: Diagnosis not present

## 2018-04-28 DIAGNOSIS — D509 Iron deficiency anemia, unspecified: Secondary | ICD-10-CM | POA: Diagnosis not present

## 2018-04-28 DIAGNOSIS — N186 End stage renal disease: Secondary | ICD-10-CM | POA: Diagnosis not present

## 2018-04-29 DIAGNOSIS — D509 Iron deficiency anemia, unspecified: Secondary | ICD-10-CM | POA: Diagnosis not present

## 2018-04-29 DIAGNOSIS — N186 End stage renal disease: Secondary | ICD-10-CM | POA: Diagnosis not present

## 2018-04-29 DIAGNOSIS — Z992 Dependence on renal dialysis: Secondary | ICD-10-CM | POA: Diagnosis not present

## 2018-04-29 DIAGNOSIS — D631 Anemia in chronic kidney disease: Secondary | ICD-10-CM | POA: Diagnosis not present

## 2018-04-29 DIAGNOSIS — N2581 Secondary hyperparathyroidism of renal origin: Secondary | ICD-10-CM | POA: Diagnosis not present

## 2018-04-30 DIAGNOSIS — N2581 Secondary hyperparathyroidism of renal origin: Secondary | ICD-10-CM | POA: Diagnosis not present

## 2018-04-30 DIAGNOSIS — Z992 Dependence on renal dialysis: Secondary | ICD-10-CM | POA: Diagnosis not present

## 2018-04-30 DIAGNOSIS — D509 Iron deficiency anemia, unspecified: Secondary | ICD-10-CM | POA: Diagnosis not present

## 2018-04-30 DIAGNOSIS — D631 Anemia in chronic kidney disease: Secondary | ICD-10-CM | POA: Diagnosis not present

## 2018-04-30 DIAGNOSIS — N186 End stage renal disease: Secondary | ICD-10-CM | POA: Diagnosis not present

## 2018-05-02 DIAGNOSIS — Z992 Dependence on renal dialysis: Secondary | ICD-10-CM | POA: Diagnosis not present

## 2018-05-02 DIAGNOSIS — D509 Iron deficiency anemia, unspecified: Secondary | ICD-10-CM | POA: Diagnosis not present

## 2018-05-02 DIAGNOSIS — N2581 Secondary hyperparathyroidism of renal origin: Secondary | ICD-10-CM | POA: Diagnosis not present

## 2018-05-02 DIAGNOSIS — N186 End stage renal disease: Secondary | ICD-10-CM | POA: Diagnosis not present

## 2018-05-02 DIAGNOSIS — D631 Anemia in chronic kidney disease: Secondary | ICD-10-CM | POA: Diagnosis not present

## 2018-05-05 DIAGNOSIS — D631 Anemia in chronic kidney disease: Secondary | ICD-10-CM | POA: Diagnosis not present

## 2018-05-05 DIAGNOSIS — D509 Iron deficiency anemia, unspecified: Secondary | ICD-10-CM | POA: Diagnosis not present

## 2018-05-05 DIAGNOSIS — Z992 Dependence on renal dialysis: Secondary | ICD-10-CM | POA: Diagnosis not present

## 2018-05-05 DIAGNOSIS — N2581 Secondary hyperparathyroidism of renal origin: Secondary | ICD-10-CM | POA: Diagnosis not present

## 2018-05-05 DIAGNOSIS — N186 End stage renal disease: Secondary | ICD-10-CM | POA: Diagnosis not present

## 2018-05-08 DIAGNOSIS — N186 End stage renal disease: Secondary | ICD-10-CM | POA: Diagnosis not present

## 2018-05-08 DIAGNOSIS — D631 Anemia in chronic kidney disease: Secondary | ICD-10-CM | POA: Diagnosis not present

## 2018-05-08 DIAGNOSIS — D509 Iron deficiency anemia, unspecified: Secondary | ICD-10-CM | POA: Diagnosis not present

## 2018-05-08 DIAGNOSIS — N2581 Secondary hyperparathyroidism of renal origin: Secondary | ICD-10-CM | POA: Diagnosis not present

## 2018-05-08 DIAGNOSIS — Z992 Dependence on renal dialysis: Secondary | ICD-10-CM | POA: Diagnosis not present

## 2018-05-10 DIAGNOSIS — Z992 Dependence on renal dialysis: Secondary | ICD-10-CM | POA: Diagnosis not present

## 2018-05-10 DIAGNOSIS — N186 End stage renal disease: Secondary | ICD-10-CM | POA: Diagnosis not present

## 2018-05-10 DIAGNOSIS — D509 Iron deficiency anemia, unspecified: Secondary | ICD-10-CM | POA: Diagnosis not present

## 2018-05-10 DIAGNOSIS — N2581 Secondary hyperparathyroidism of renal origin: Secondary | ICD-10-CM | POA: Diagnosis not present

## 2018-05-10 DIAGNOSIS — D631 Anemia in chronic kidney disease: Secondary | ICD-10-CM | POA: Diagnosis not present

## 2018-05-12 DIAGNOSIS — N186 End stage renal disease: Secondary | ICD-10-CM | POA: Diagnosis not present

## 2018-05-12 DIAGNOSIS — D631 Anemia in chronic kidney disease: Secondary | ICD-10-CM | POA: Diagnosis not present

## 2018-05-12 DIAGNOSIS — N2581 Secondary hyperparathyroidism of renal origin: Secondary | ICD-10-CM | POA: Diagnosis not present

## 2018-05-12 DIAGNOSIS — Z992 Dependence on renal dialysis: Secondary | ICD-10-CM | POA: Diagnosis not present

## 2018-05-12 DIAGNOSIS — D509 Iron deficiency anemia, unspecified: Secondary | ICD-10-CM | POA: Diagnosis not present

## 2018-05-15 DIAGNOSIS — Z992 Dependence on renal dialysis: Secondary | ICD-10-CM | POA: Diagnosis not present

## 2018-05-15 DIAGNOSIS — D509 Iron deficiency anemia, unspecified: Secondary | ICD-10-CM | POA: Diagnosis not present

## 2018-05-15 DIAGNOSIS — N2581 Secondary hyperparathyroidism of renal origin: Secondary | ICD-10-CM | POA: Diagnosis not present

## 2018-05-15 DIAGNOSIS — N186 End stage renal disease: Secondary | ICD-10-CM | POA: Diagnosis not present

## 2018-05-15 DIAGNOSIS — D631 Anemia in chronic kidney disease: Secondary | ICD-10-CM | POA: Diagnosis not present

## 2018-05-17 DIAGNOSIS — Z992 Dependence on renal dialysis: Secondary | ICD-10-CM | POA: Diagnosis not present

## 2018-05-17 DIAGNOSIS — D631 Anemia in chronic kidney disease: Secondary | ICD-10-CM | POA: Diagnosis not present

## 2018-05-17 DIAGNOSIS — N186 End stage renal disease: Secondary | ICD-10-CM | POA: Diagnosis not present

## 2018-05-17 DIAGNOSIS — D509 Iron deficiency anemia, unspecified: Secondary | ICD-10-CM | POA: Diagnosis not present

## 2018-05-17 DIAGNOSIS — N2581 Secondary hyperparathyroidism of renal origin: Secondary | ICD-10-CM | POA: Diagnosis not present

## 2018-05-19 DIAGNOSIS — D509 Iron deficiency anemia, unspecified: Secondary | ICD-10-CM | POA: Diagnosis not present

## 2018-05-19 DIAGNOSIS — N186 End stage renal disease: Secondary | ICD-10-CM | POA: Diagnosis not present

## 2018-05-19 DIAGNOSIS — D631 Anemia in chronic kidney disease: Secondary | ICD-10-CM | POA: Diagnosis not present

## 2018-05-19 DIAGNOSIS — Z992 Dependence on renal dialysis: Secondary | ICD-10-CM | POA: Diagnosis not present

## 2018-05-19 DIAGNOSIS — N2581 Secondary hyperparathyroidism of renal origin: Secondary | ICD-10-CM | POA: Diagnosis not present

## 2018-05-22 DIAGNOSIS — N2581 Secondary hyperparathyroidism of renal origin: Secondary | ICD-10-CM | POA: Diagnosis not present

## 2018-05-22 DIAGNOSIS — N186 End stage renal disease: Secondary | ICD-10-CM | POA: Diagnosis not present

## 2018-05-22 DIAGNOSIS — D631 Anemia in chronic kidney disease: Secondary | ICD-10-CM | POA: Diagnosis not present

## 2018-05-22 DIAGNOSIS — Z992 Dependence on renal dialysis: Secondary | ICD-10-CM | POA: Diagnosis not present

## 2018-05-22 DIAGNOSIS — D509 Iron deficiency anemia, unspecified: Secondary | ICD-10-CM | POA: Diagnosis not present

## 2018-05-23 ENCOUNTER — Encounter (HOSPITAL_COMMUNITY): Payer: Self-pay

## 2018-05-23 ENCOUNTER — Encounter: Payer: Self-pay | Admitting: Vascular Surgery

## 2018-05-24 DIAGNOSIS — D509 Iron deficiency anemia, unspecified: Secondary | ICD-10-CM | POA: Diagnosis not present

## 2018-05-24 DIAGNOSIS — Z992 Dependence on renal dialysis: Secondary | ICD-10-CM | POA: Diagnosis not present

## 2018-05-24 DIAGNOSIS — N2581 Secondary hyperparathyroidism of renal origin: Secondary | ICD-10-CM | POA: Diagnosis not present

## 2018-05-24 DIAGNOSIS — D631 Anemia in chronic kidney disease: Secondary | ICD-10-CM | POA: Diagnosis not present

## 2018-05-24 DIAGNOSIS — N186 End stage renal disease: Secondary | ICD-10-CM | POA: Diagnosis not present

## 2018-05-26 DIAGNOSIS — N186 End stage renal disease: Secondary | ICD-10-CM | POA: Diagnosis not present

## 2018-05-26 DIAGNOSIS — D509 Iron deficiency anemia, unspecified: Secondary | ICD-10-CM | POA: Diagnosis not present

## 2018-05-26 DIAGNOSIS — Z992 Dependence on renal dialysis: Secondary | ICD-10-CM | POA: Diagnosis not present

## 2018-05-26 DIAGNOSIS — N2581 Secondary hyperparathyroidism of renal origin: Secondary | ICD-10-CM | POA: Diagnosis not present

## 2018-05-26 DIAGNOSIS — D631 Anemia in chronic kidney disease: Secondary | ICD-10-CM | POA: Diagnosis not present

## 2018-05-29 DIAGNOSIS — N2581 Secondary hyperparathyroidism of renal origin: Secondary | ICD-10-CM | POA: Diagnosis not present

## 2018-05-29 DIAGNOSIS — D509 Iron deficiency anemia, unspecified: Secondary | ICD-10-CM | POA: Diagnosis not present

## 2018-05-29 DIAGNOSIS — Z992 Dependence on renal dialysis: Secondary | ICD-10-CM | POA: Diagnosis not present

## 2018-05-29 DIAGNOSIS — N186 End stage renal disease: Secondary | ICD-10-CM | POA: Diagnosis not present

## 2018-05-29 DIAGNOSIS — D631 Anemia in chronic kidney disease: Secondary | ICD-10-CM | POA: Diagnosis not present

## 2018-05-31 DIAGNOSIS — D509 Iron deficiency anemia, unspecified: Secondary | ICD-10-CM | POA: Diagnosis not present

## 2018-05-31 DIAGNOSIS — D631 Anemia in chronic kidney disease: Secondary | ICD-10-CM | POA: Diagnosis not present

## 2018-05-31 DIAGNOSIS — Z992 Dependence on renal dialysis: Secondary | ICD-10-CM | POA: Diagnosis not present

## 2018-05-31 DIAGNOSIS — N186 End stage renal disease: Secondary | ICD-10-CM | POA: Diagnosis not present

## 2018-05-31 DIAGNOSIS — N2581 Secondary hyperparathyroidism of renal origin: Secondary | ICD-10-CM | POA: Diagnosis not present

## 2018-06-02 DIAGNOSIS — N2581 Secondary hyperparathyroidism of renal origin: Secondary | ICD-10-CM | POA: Diagnosis not present

## 2018-06-02 DIAGNOSIS — Z992 Dependence on renal dialysis: Secondary | ICD-10-CM | POA: Diagnosis not present

## 2018-06-02 DIAGNOSIS — N186 End stage renal disease: Secondary | ICD-10-CM | POA: Diagnosis not present

## 2018-06-02 DIAGNOSIS — D631 Anemia in chronic kidney disease: Secondary | ICD-10-CM | POA: Diagnosis not present

## 2018-06-02 DIAGNOSIS — D509 Iron deficiency anemia, unspecified: Secondary | ICD-10-CM | POA: Diagnosis not present

## 2018-06-05 DIAGNOSIS — D509 Iron deficiency anemia, unspecified: Secondary | ICD-10-CM | POA: Diagnosis not present

## 2018-06-05 DIAGNOSIS — D631 Anemia in chronic kidney disease: Secondary | ICD-10-CM | POA: Diagnosis not present

## 2018-06-05 DIAGNOSIS — N186 End stage renal disease: Secondary | ICD-10-CM | POA: Diagnosis not present

## 2018-06-05 DIAGNOSIS — N2581 Secondary hyperparathyroidism of renal origin: Secondary | ICD-10-CM | POA: Diagnosis not present

## 2018-06-05 DIAGNOSIS — Z992 Dependence on renal dialysis: Secondary | ICD-10-CM | POA: Diagnosis not present

## 2018-06-07 DIAGNOSIS — N2581 Secondary hyperparathyroidism of renal origin: Secondary | ICD-10-CM | POA: Diagnosis not present

## 2018-06-07 DIAGNOSIS — N186 End stage renal disease: Secondary | ICD-10-CM | POA: Diagnosis not present

## 2018-06-07 DIAGNOSIS — D631 Anemia in chronic kidney disease: Secondary | ICD-10-CM | POA: Diagnosis not present

## 2018-06-07 DIAGNOSIS — D509 Iron deficiency anemia, unspecified: Secondary | ICD-10-CM | POA: Diagnosis not present

## 2018-06-07 DIAGNOSIS — Z992 Dependence on renal dialysis: Secondary | ICD-10-CM | POA: Diagnosis not present

## 2018-06-09 DIAGNOSIS — D631 Anemia in chronic kidney disease: Secondary | ICD-10-CM | POA: Diagnosis not present

## 2018-06-09 DIAGNOSIS — N186 End stage renal disease: Secondary | ICD-10-CM | POA: Diagnosis not present

## 2018-06-09 DIAGNOSIS — D509 Iron deficiency anemia, unspecified: Secondary | ICD-10-CM | POA: Diagnosis not present

## 2018-06-09 DIAGNOSIS — Z992 Dependence on renal dialysis: Secondary | ICD-10-CM | POA: Diagnosis not present

## 2018-06-09 DIAGNOSIS — N2581 Secondary hyperparathyroidism of renal origin: Secondary | ICD-10-CM | POA: Diagnosis not present

## 2018-06-10 DIAGNOSIS — Z992 Dependence on renal dialysis: Secondary | ICD-10-CM | POA: Diagnosis not present

## 2018-06-10 DIAGNOSIS — D631 Anemia in chronic kidney disease: Secondary | ICD-10-CM | POA: Diagnosis not present

## 2018-06-10 DIAGNOSIS — D509 Iron deficiency anemia, unspecified: Secondary | ICD-10-CM | POA: Diagnosis not present

## 2018-06-10 DIAGNOSIS — N2581 Secondary hyperparathyroidism of renal origin: Secondary | ICD-10-CM | POA: Diagnosis not present

## 2018-06-10 DIAGNOSIS — N186 End stage renal disease: Secondary | ICD-10-CM | POA: Diagnosis not present

## 2018-06-12 DIAGNOSIS — N186 End stage renal disease: Secondary | ICD-10-CM | POA: Diagnosis not present

## 2018-06-12 DIAGNOSIS — N2581 Secondary hyperparathyroidism of renal origin: Secondary | ICD-10-CM | POA: Diagnosis not present

## 2018-06-12 DIAGNOSIS — D631 Anemia in chronic kidney disease: Secondary | ICD-10-CM | POA: Diagnosis not present

## 2018-06-12 DIAGNOSIS — D509 Iron deficiency anemia, unspecified: Secondary | ICD-10-CM | POA: Diagnosis not present

## 2018-06-12 DIAGNOSIS — Z992 Dependence on renal dialysis: Secondary | ICD-10-CM | POA: Diagnosis not present

## 2018-06-14 DIAGNOSIS — N186 End stage renal disease: Secondary | ICD-10-CM | POA: Diagnosis not present

## 2018-06-14 DIAGNOSIS — D631 Anemia in chronic kidney disease: Secondary | ICD-10-CM | POA: Diagnosis not present

## 2018-06-14 DIAGNOSIS — D509 Iron deficiency anemia, unspecified: Secondary | ICD-10-CM | POA: Diagnosis not present

## 2018-06-14 DIAGNOSIS — Z992 Dependence on renal dialysis: Secondary | ICD-10-CM | POA: Diagnosis not present

## 2018-06-14 DIAGNOSIS — N2581 Secondary hyperparathyroidism of renal origin: Secondary | ICD-10-CM | POA: Diagnosis not present

## 2018-06-16 DIAGNOSIS — D631 Anemia in chronic kidney disease: Secondary | ICD-10-CM | POA: Diagnosis not present

## 2018-06-16 DIAGNOSIS — N2581 Secondary hyperparathyroidism of renal origin: Secondary | ICD-10-CM | POA: Diagnosis not present

## 2018-06-16 DIAGNOSIS — D509 Iron deficiency anemia, unspecified: Secondary | ICD-10-CM | POA: Diagnosis not present

## 2018-06-16 DIAGNOSIS — N186 End stage renal disease: Secondary | ICD-10-CM | POA: Diagnosis not present

## 2018-06-16 DIAGNOSIS — Z992 Dependence on renal dialysis: Secondary | ICD-10-CM | POA: Diagnosis not present

## 2018-06-19 DIAGNOSIS — D509 Iron deficiency anemia, unspecified: Secondary | ICD-10-CM | POA: Diagnosis not present

## 2018-06-19 DIAGNOSIS — D631 Anemia in chronic kidney disease: Secondary | ICD-10-CM | POA: Diagnosis not present

## 2018-06-19 DIAGNOSIS — N186 End stage renal disease: Secondary | ICD-10-CM | POA: Diagnosis not present

## 2018-06-19 DIAGNOSIS — N2581 Secondary hyperparathyroidism of renal origin: Secondary | ICD-10-CM | POA: Diagnosis not present

## 2018-06-19 DIAGNOSIS — Z992 Dependence on renal dialysis: Secondary | ICD-10-CM | POA: Diagnosis not present

## 2018-06-20 DIAGNOSIS — K5790 Diverticulosis of intestine, part unspecified, without perforation or abscess without bleeding: Secondary | ICD-10-CM | POA: Diagnosis not present

## 2018-06-20 DIAGNOSIS — C641 Malignant neoplasm of right kidney, except renal pelvis: Secondary | ICD-10-CM | POA: Diagnosis not present

## 2018-06-20 DIAGNOSIS — R911 Solitary pulmonary nodule: Secondary | ICD-10-CM | POA: Diagnosis not present

## 2018-06-20 DIAGNOSIS — Z87891 Personal history of nicotine dependence: Secondary | ICD-10-CM | POA: Diagnosis not present

## 2018-06-20 DIAGNOSIS — R918 Other nonspecific abnormal finding of lung field: Secondary | ICD-10-CM | POA: Diagnosis not present

## 2018-06-20 DIAGNOSIS — C3412 Malignant neoplasm of upper lobe, left bronchus or lung: Secondary | ICD-10-CM | POA: Diagnosis not present

## 2018-06-21 DIAGNOSIS — N186 End stage renal disease: Secondary | ICD-10-CM | POA: Diagnosis not present

## 2018-06-21 DIAGNOSIS — N2581 Secondary hyperparathyroidism of renal origin: Secondary | ICD-10-CM | POA: Diagnosis not present

## 2018-06-21 DIAGNOSIS — D509 Iron deficiency anemia, unspecified: Secondary | ICD-10-CM | POA: Diagnosis not present

## 2018-06-21 DIAGNOSIS — D631 Anemia in chronic kidney disease: Secondary | ICD-10-CM | POA: Diagnosis not present

## 2018-06-21 DIAGNOSIS — Z992 Dependence on renal dialysis: Secondary | ICD-10-CM | POA: Diagnosis not present

## 2018-06-23 DIAGNOSIS — D631 Anemia in chronic kidney disease: Secondary | ICD-10-CM | POA: Diagnosis not present

## 2018-06-23 DIAGNOSIS — N186 End stage renal disease: Secondary | ICD-10-CM | POA: Diagnosis not present

## 2018-06-23 DIAGNOSIS — D509 Iron deficiency anemia, unspecified: Secondary | ICD-10-CM | POA: Diagnosis not present

## 2018-06-23 DIAGNOSIS — N2581 Secondary hyperparathyroidism of renal origin: Secondary | ICD-10-CM | POA: Diagnosis not present

## 2018-06-23 DIAGNOSIS — Z992 Dependence on renal dialysis: Secondary | ICD-10-CM | POA: Diagnosis not present

## 2018-06-26 DIAGNOSIS — D631 Anemia in chronic kidney disease: Secondary | ICD-10-CM | POA: Diagnosis not present

## 2018-06-26 DIAGNOSIS — N2581 Secondary hyperparathyroidism of renal origin: Secondary | ICD-10-CM | POA: Diagnosis not present

## 2018-06-26 DIAGNOSIS — N186 End stage renal disease: Secondary | ICD-10-CM | POA: Diagnosis not present

## 2018-06-26 DIAGNOSIS — Z992 Dependence on renal dialysis: Secondary | ICD-10-CM | POA: Diagnosis not present

## 2018-06-26 DIAGNOSIS — D509 Iron deficiency anemia, unspecified: Secondary | ICD-10-CM | POA: Diagnosis not present

## 2018-06-27 DIAGNOSIS — R911 Solitary pulmonary nodule: Secondary | ICD-10-CM | POA: Diagnosis not present

## 2018-06-27 DIAGNOSIS — Z87891 Personal history of nicotine dependence: Secondary | ICD-10-CM | POA: Diagnosis not present

## 2018-06-27 DIAGNOSIS — C3412 Malignant neoplasm of upper lobe, left bronchus or lung: Secondary | ICD-10-CM | POA: Diagnosis not present

## 2018-06-27 DIAGNOSIS — R918 Other nonspecific abnormal finding of lung field: Secondary | ICD-10-CM | POA: Diagnosis not present

## 2018-06-27 DIAGNOSIS — I10 Essential (primary) hypertension: Secondary | ICD-10-CM | POA: Diagnosis not present

## 2018-06-27 DIAGNOSIS — C641 Malignant neoplasm of right kidney, except renal pelvis: Secondary | ICD-10-CM | POA: Diagnosis not present

## 2018-06-28 DIAGNOSIS — Z992 Dependence on renal dialysis: Secondary | ICD-10-CM | POA: Diagnosis not present

## 2018-06-28 DIAGNOSIS — D631 Anemia in chronic kidney disease: Secondary | ICD-10-CM | POA: Diagnosis not present

## 2018-06-28 DIAGNOSIS — N2581 Secondary hyperparathyroidism of renal origin: Secondary | ICD-10-CM | POA: Diagnosis not present

## 2018-06-28 DIAGNOSIS — N186 End stage renal disease: Secondary | ICD-10-CM | POA: Diagnosis not present

## 2018-06-28 DIAGNOSIS — D509 Iron deficiency anemia, unspecified: Secondary | ICD-10-CM | POA: Diagnosis not present

## 2018-06-30 DIAGNOSIS — D509 Iron deficiency anemia, unspecified: Secondary | ICD-10-CM | POA: Diagnosis not present

## 2018-06-30 DIAGNOSIS — N2581 Secondary hyperparathyroidism of renal origin: Secondary | ICD-10-CM | POA: Diagnosis not present

## 2018-06-30 DIAGNOSIS — Z992 Dependence on renal dialysis: Secondary | ICD-10-CM | POA: Diagnosis not present

## 2018-06-30 DIAGNOSIS — D631 Anemia in chronic kidney disease: Secondary | ICD-10-CM | POA: Diagnosis not present

## 2018-06-30 DIAGNOSIS — N186 End stage renal disease: Secondary | ICD-10-CM | POA: Diagnosis not present

## 2018-07-03 DIAGNOSIS — D509 Iron deficiency anemia, unspecified: Secondary | ICD-10-CM | POA: Diagnosis not present

## 2018-07-03 DIAGNOSIS — N186 End stage renal disease: Secondary | ICD-10-CM | POA: Diagnosis not present

## 2018-07-03 DIAGNOSIS — N2581 Secondary hyperparathyroidism of renal origin: Secondary | ICD-10-CM | POA: Diagnosis not present

## 2018-07-03 DIAGNOSIS — D631 Anemia in chronic kidney disease: Secondary | ICD-10-CM | POA: Diagnosis not present

## 2018-07-03 DIAGNOSIS — Z992 Dependence on renal dialysis: Secondary | ICD-10-CM | POA: Diagnosis not present

## 2018-07-05 DIAGNOSIS — D631 Anemia in chronic kidney disease: Secondary | ICD-10-CM | POA: Diagnosis not present

## 2018-07-05 DIAGNOSIS — N186 End stage renal disease: Secondary | ICD-10-CM | POA: Diagnosis not present

## 2018-07-05 DIAGNOSIS — N2581 Secondary hyperparathyroidism of renal origin: Secondary | ICD-10-CM | POA: Diagnosis not present

## 2018-07-05 DIAGNOSIS — D509 Iron deficiency anemia, unspecified: Secondary | ICD-10-CM | POA: Diagnosis not present

## 2018-07-05 DIAGNOSIS — Z992 Dependence on renal dialysis: Secondary | ICD-10-CM | POA: Diagnosis not present

## 2018-07-06 DIAGNOSIS — C3412 Malignant neoplasm of upper lobe, left bronchus or lung: Secondary | ICD-10-CM | POA: Diagnosis not present

## 2018-07-06 DIAGNOSIS — R911 Solitary pulmonary nodule: Secondary | ICD-10-CM | POA: Diagnosis not present

## 2018-07-06 DIAGNOSIS — J939 Pneumothorax, unspecified: Secondary | ICD-10-CM | POA: Diagnosis not present

## 2018-07-07 DIAGNOSIS — Z992 Dependence on renal dialysis: Secondary | ICD-10-CM | POA: Diagnosis not present

## 2018-07-07 DIAGNOSIS — N186 End stage renal disease: Secondary | ICD-10-CM | POA: Diagnosis not present

## 2018-07-07 DIAGNOSIS — D509 Iron deficiency anemia, unspecified: Secondary | ICD-10-CM | POA: Diagnosis not present

## 2018-07-07 DIAGNOSIS — D631 Anemia in chronic kidney disease: Secondary | ICD-10-CM | POA: Diagnosis not present

## 2018-07-07 DIAGNOSIS — N2581 Secondary hyperparathyroidism of renal origin: Secondary | ICD-10-CM | POA: Diagnosis not present

## 2018-07-09 DIAGNOSIS — N186 End stage renal disease: Secondary | ICD-10-CM | POA: Diagnosis not present

## 2018-07-09 DIAGNOSIS — D509 Iron deficiency anemia, unspecified: Secondary | ICD-10-CM | POA: Diagnosis not present

## 2018-07-09 DIAGNOSIS — D631 Anemia in chronic kidney disease: Secondary | ICD-10-CM | POA: Diagnosis not present

## 2018-07-09 DIAGNOSIS — Z992 Dependence on renal dialysis: Secondary | ICD-10-CM | POA: Diagnosis not present

## 2018-07-09 DIAGNOSIS — N2581 Secondary hyperparathyroidism of renal origin: Secondary | ICD-10-CM | POA: Diagnosis not present

## 2018-07-10 DIAGNOSIS — N186 End stage renal disease: Secondary | ICD-10-CM | POA: Diagnosis not present

## 2018-07-10 DIAGNOSIS — Z992 Dependence on renal dialysis: Secondary | ICD-10-CM | POA: Diagnosis not present

## 2018-07-10 DIAGNOSIS — D509 Iron deficiency anemia, unspecified: Secondary | ICD-10-CM | POA: Diagnosis not present

## 2018-07-10 DIAGNOSIS — D631 Anemia in chronic kidney disease: Secondary | ICD-10-CM | POA: Diagnosis not present

## 2018-07-10 DIAGNOSIS — N2581 Secondary hyperparathyroidism of renal origin: Secondary | ICD-10-CM | POA: Diagnosis not present

## 2018-07-11 DIAGNOSIS — J95811 Postprocedural pneumothorax: Secondary | ICD-10-CM | POA: Diagnosis not present

## 2018-07-11 DIAGNOSIS — C3411 Malignant neoplasm of upper lobe, right bronchus or lung: Secondary | ICD-10-CM | POA: Diagnosis not present

## 2018-07-11 DIAGNOSIS — J9383 Other pneumothorax: Secondary | ICD-10-CM | POA: Diagnosis not present

## 2018-07-12 DIAGNOSIS — D631 Anemia in chronic kidney disease: Secondary | ICD-10-CM | POA: Diagnosis not present

## 2018-07-12 DIAGNOSIS — Z992 Dependence on renal dialysis: Secondary | ICD-10-CM | POA: Diagnosis not present

## 2018-07-12 DIAGNOSIS — N2581 Secondary hyperparathyroidism of renal origin: Secondary | ICD-10-CM | POA: Diagnosis not present

## 2018-07-12 DIAGNOSIS — N186 End stage renal disease: Secondary | ICD-10-CM | POA: Diagnosis not present

## 2018-07-12 DIAGNOSIS — D509 Iron deficiency anemia, unspecified: Secondary | ICD-10-CM | POA: Diagnosis not present

## 2018-07-14 DIAGNOSIS — D631 Anemia in chronic kidney disease: Secondary | ICD-10-CM | POA: Diagnosis not present

## 2018-07-14 DIAGNOSIS — N2581 Secondary hyperparathyroidism of renal origin: Secondary | ICD-10-CM | POA: Diagnosis not present

## 2018-07-14 DIAGNOSIS — Z992 Dependence on renal dialysis: Secondary | ICD-10-CM | POA: Diagnosis not present

## 2018-07-14 DIAGNOSIS — D509 Iron deficiency anemia, unspecified: Secondary | ICD-10-CM | POA: Diagnosis not present

## 2018-07-14 DIAGNOSIS — N186 End stage renal disease: Secondary | ICD-10-CM | POA: Diagnosis not present

## 2018-07-17 DIAGNOSIS — D509 Iron deficiency anemia, unspecified: Secondary | ICD-10-CM | POA: Diagnosis not present

## 2018-07-17 DIAGNOSIS — N2581 Secondary hyperparathyroidism of renal origin: Secondary | ICD-10-CM | POA: Diagnosis not present

## 2018-07-17 DIAGNOSIS — Z992 Dependence on renal dialysis: Secondary | ICD-10-CM | POA: Diagnosis not present

## 2018-07-17 DIAGNOSIS — N186 End stage renal disease: Secondary | ICD-10-CM | POA: Diagnosis not present

## 2018-07-17 DIAGNOSIS — D631 Anemia in chronic kidney disease: Secondary | ICD-10-CM | POA: Diagnosis not present

## 2018-07-17 NOTE — Progress Notes (Signed)
Radiation Oncology         (336) [phone] ________________________________  Name: [name]        MRN: [id number]  Date of Service: 07/18/2018 DOB: Oct 02, 1930  WU:[id number], [name] Reichmann, MD  Everardo All, MD     REFERRING PHYSICIAN: Everardo All, MD   DIAGNOSIS: The encounter diagnosis was Malignant neoplasm of right upper lobe of lung ([place]).   HISTORY OF PRESENT ILLNESS: [name] is a 85 y.o. male seen at the request of Dr. 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  He has a history of a right upper lobe nodule that has been followed and a comparison PET scan on 06/20/2018 revealed a 10 x 13 mm right upper lobe nodule that had increased from 10 x 4 mm in May 2019.  The SUV max was 11, and previously had been 1.  He has a stable right lower lobe nodule measuring 12 mm and a 7 mm right lower lobe nodule slightly increased from previous.  A 5 mm right lower lobe nodule was also noted and previously 4 mm, and 2 nodules in the right lower lobe were seen but not present previously.  A stable 7 mm right lower lobe nodule, and 9 mm left lower lobe nodule were stable, and a 6 mm right upper lobe nodule was increased.  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  He was not a surgical candidate, and comes today to discuss options of stereotactic body radiotherapy (SBRT).    PREVIOUS RADIATION THERAPY: No   PAST MEDICAL HISTORY:  Past Medical History:  Diagnosis Date  . Arthritis    ankle , hip, knees , low back   . BPH (benign prostatic hypertrophy)   . Cancer ([place])    lung  . Cancer ([place]) 2001   renal  . Chronic inflammatory demyelinating  polyneuropathy ([place]) 04/07/11   ,diagnosed 1992, tx. /w prednisone x 17 yrs. has been d/c for 3 yrs.   . Chronic kidney disease       . CIDP (chronic inflammatory demyelinating polyneuropathy) ([place])   . COPD (chronic obstructive pulmonary disease) ([place])    told that he has "a bit of emphysema"  . Dialysis patient ([place])   . GERD (gastroesophageal reflux disease)   . Hypertension    stress test done - many years ago  . Kidney calculus   . Pneumonia        PAST SURGICAL HISTORY: Past Surgical History:  Procedure Laterality Date  . ANKLE FUSION     R ankle  . AV FISTULA PLACEMENT  06/01/2011   Procedure: ARTERIOVENOUS (AV) FISTULA CREATION;  Surgeon: Angelia Mould, MD;  Location: [facility] OR;  Service: Vascular;  Laterality: Left;  Creation of Left Arteriovenous fistula  . AV FISTULA PLACEMENT Left 07/12/2012   Procedure: ARTERIOVENOUS (AV) FISTULA CREATION;  Surgeon: Conrad Penfield, MD;  Location: [place];  Service: Vascular;  Laterality: Left;  . AV FISTULA PLACEMENT Left 4.11.14  . BASCILIC VEIN TRANSPOSITION Left 08/28/2012   Procedure: BASCILIC VEIN TRANSPOSITION;  Surgeon: Conrad Friedensburg, MD;  Location: [place];  Service: Vascular;  Laterality: Left;  left 2nd stage basilic vein transposition  .  CHOLECYSTECTOMY    . COLONOSCOPY  2005   Dr. Laural Golden, diverticulosis, hemorrhoids, 2 small polyps  . FISTULOGRAM N/A 08/16/2011   Procedure: FISTULOGRAM;  Surgeon: Angelia Mould, MD;  Location: [facility] CATH LAB;  Service: Cardiovascular;  Laterality: N/A;  . fistulogram left radial cephalic AV fistula  [phone]     Dr. Scot Dock  . HIP ARTHROPLASTY Right    "partial"  . JOINT REPLACEMENT     L knee- 2010- [facility], L hip partial replacement   . LUNG REMOVAL, PARTIAL  2003   partial, right  . NEPHRECTOMY  2001   left  . SHUNTOGRAM N/A 05/15/2012   Procedure: Earney Mallet;  Surgeon: Conrad Westchester, MD;  [facility] C[facility] LAB;  Service: Cardiovascular;  Laterality: N/A;  . SHUNTOGRAM N/A 05/09/2014    Procedure: FISTULOGRAM;  Surgeon: Conrad Bartholomew, MD;  Location: [facility] CATH LAB;  Service: Cardiovascular;  Laterality: N/A;     FAMILY HISTORY:  Family History  Problem Relation Age of Onset  . Heart disease Mother   . Heart disease Father   . Heart attack Father   . Anesthesia problems Neg Hx   . Hypotension Neg Hx   . Malignant hyperthermia Neg Hx   . Pseudochol deficiency Neg Hx   . Colon cancer Neg Hx      SOCIAL HISTORY:  reports that he quit smoking about 41 years ago. His smoking use included cigarettes. He quit after 35.00 years of use. His smokeless tobacco use includes chew. He reports that he does not drink alcohol or use drugs.   ALLERGIES: Patient has no known allergies.   MEDICATIONS:  Current Outpatient Medications  Medication Sig Dispense Refill  . clobetasol cream (TEMOVATE) 2.70 % Apply 1 application topically 2 (two) times daily as needed (itchy ears).    . lidocaine-prilocaine (EMLA) cream Apply 1 application topically as directed. Apply to access site for dialysis 1 hour prior to visit    . metoprolol tartrate (LOPRESSOR) 25 MG tablet Take 1 tablet (25 mg total) by mouth 2 (two) times daily. 180 tablet 3  . omeprazole (PRILOSEC) 20 MG capsule Take 20 mg by mouth daily.    . sevelamer carbonate (RENVELA) 800 MG tablet Take 800-1,600 mg by mouth as directed. Take 2 tablets (1600mg ) three times a day with meals and 1 tablet (800mg ) with a snack     No current facility-administered medications for this encounter.      REVIEW OF SYSTEMS: On review of systems, the patient reports that he is doing well overall. He denies any chest pain, shortness of breath, cough, fevers, chills, night sweats, unintended weight changes. He denies hemoptysis. He denies any bowel or bladder disturbances, and denies abdominal pain, nausea or vomiting. He denies any new musculoskeletal or joint aches or pains. A complete review of systems is obtained and is otherwise negative.      PHYSICAL EXAM:  Wt Readings from Last 3 Encounters:  07/18/18 180 lb 12.8 oz (82 kg)  04/18/18 180 lb (81.6 kg)  02/16/18 180 lb (81.6 kg)   Temp Readings from Last 3 Encounters:  07/18/18 98.3 F (36.8 C) (Oral)  04/18/18 (!) 97 F (36.1 C) (Oral)  06/19/17 98.2 F (36.8 C) (Oral)   BP Readings from Last 3 Encounters:  07/18/18 (!) 183/73  04/18/18 (!) 183/83  02/16/18 128/76   Pulse Readings from Last 3 Encounters:  07/18/18 74  04/18/18 65  02/16/18 64   Pain Assessment Pain Score: 0-No pain/10  In  general this is a well appearing caucasian male in no acute distress. He is alert and oriented x4 and appropriate throughout the examination. HEENT reveals that the patient is normocephalic, atraumatic. EOMs are intact.  Skin is intact without any evidence of gross lesions. Cardiovascular exam reveals a regular rate and rhythm, no clicks rubs or murmurs are auscultated. Chest is clear to auscultation bilaterally. Lymphatic assessment is performed and does not reveal any adenopathy in the cervical or supraclavicular chains. Abdomen has active bowel sounds in all quadrants and is intact. The abdomen is soft, non tender, non distended. Lower extremities are negative for pretibial pitting edema, deep calf tenderness, cyanosis or clubbing.   ECOG = 0  0 - Asymptomatic (Fully active, able to carry on all predisease activities without restriction)  1 - Symptomatic but completely ambulatory (Restricted in physically strenuous activity but ambulatory and able to carry out work of a light or sedentary nature. For example, light housework, office work)  2 - Symptomatic, <50% in bed during the day (Ambulatory and capable of all self care but unable to carry out any work activities. Up and about more than 50% of waking hours)  3 - Symptomatic, >50% in bed, but not bedbound (Capable of only limited self-care, confined to bed or chair 50% or more of waking hours)  4 - Bedbound (Completely  disabled. Cannot carry on any self-care. Totally confined to bed or chair)  5 - Death   Eustace Pen MM, Creech RH, Tormey DC, et al. [phone]). "Toxicity and response criteria of the [facility]". [place] Oncol. 5 (6): 649-55    LABORATORY DATA:  Lab Results  Component Value Date   WBC 7.8 06/03/2017   HGB 10.5 (L) 06/03/2017   HCT 32.9 (L) 06/03/2017   MCV 99.1 06/03/2017   PLT 214 06/03/2017   Lab Results  Component Value Date   NA 134 (L) 06/03/2017   K 5.0 06/03/2017   CL 94 (L) 06/03/2017   CO2 21 (L) 06/03/2017   Lab Results  Component Value Date   ALT 13 10/19/2011   AST 14 10/19/2011   ALKPHOS 85 10/19/2011   BILITOT 0.3 10/19/2011      RADIOGRAPHY: No results found.     IMPRESSION/PLAN: 1. Stage IA2, cT1bN0M0 NSCLC, squamous cell carcinoma of the RUL. Dr. Lisbeth Renshaw discusses the pathology findings and reviews the nature of early stage lung cancer and reviews that while surgery is the gold standard form of therapy, for patients like Mr. Latorre who are not felt to be appropriate candidates for resection, stereotactic body radiotherapy is an alternative. We discussed the risks, benefits, short, and long term effects of radiotherapy, and the patient is interested in proceeding. Dr. Lisbeth Renshaw discusses the delivery and logistics of radiotherapy and anticipates a course of 3-5 fractions, likely 3 to the chest with SBRT. Written consent is obtained and placed in the chart, a copy was provided to the patient. He will be contacted to coordinate simulation. 2. End Stage Renal Disease on HD. The patient will continue HD on M/W/F. We will plan to proceed with treatments on Tuesday and Thursday for his therapy sessions. 3. Remote history of Stage I NSCLC of the left upper lobe and remote history of renal cell carcinoma of the left kidney. He will be followed expectantly regarding history.   The above documentation reflects my direct findings during this shared patient  visit. Please see the separate note by Dr. Lisbeth Renshaw on this date for the remainder  of the patient's plan of care.    Carola Rhine, PAC

## 2018-07-17 NOTE — Progress Notes (Signed)
Thoracic Location of Tumor / Histology: Malignant neoplasm of RUL lung. Non small cell lung cancer-Squamous cell carcinoma  Patient presented with SOB  PET 06/20/2018: Increase in right upper lobe nodule which now has adjacent satellite nodules.  This is hypermetabolic and is consistent with neoplasm.  There are several other non-hypermetabolic nodules.  Some are stable, some are slightly increased, and some are new.  Biopsies of RUL 07/06/2018   Tobacco/Marijuana/Snuff/ETOH use: Former Smoker, [place]  Past/Anticipated interventions by cardiothoracic surgery, if any:   Past/Anticipated interventions by medical oncology, if any:  Dr. Tressie Stalker 07/11/2018 -Malignant neoplasm of upper lobe of right lung -I am not sure he is the best candidate for systemic chemotherapy. -He is an excellent candidate for radiation since I do not think he is a candidate for surgical intervention. - We will see him in about 8 weeks from now to make sure everything has gone smoothly for him and then we will decide upon follow-up.  Signs/Symptoms  Weight changes, if any: No  Respiratory complaints, if any: SOB  Hemoptysis, if any: No cough or hemoptysis noted  Pain issues, if any:  No  BP (!) 183/73 (BP Location: Right Arm, Patient Position: Sitting)   Pulse 74   Temp 98.3 F (36.8 C) (Oral)   Resp 18   Ht 5\' 7"  (1.702 m)   Wt 180 lb 12.8 oz (82 kg)   SpO2 99%   BMI 28.32 kg/m    Wt Readings from Last 3 Encounters:  07/18/18 180 lb 12.8 oz (82 kg)  04/18/18 180 lb (81.6 kg)  02/16/18 180 lb (81.6 kg)    SAFETY ISSUES:  Prior radiation? No  Pacemaker/ICD? No  Possible current pregnancy? No  Is the patient on methotrexate? 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 T1 N0 M0 -Right renal cell carcinoma status post nephrectomy in April 2001. T2N0

## 2018-07-18 ENCOUNTER — Encounter: Payer: Self-pay | Admitting: *Deleted

## 2018-07-18 ENCOUNTER — Ambulatory Visit
Admission: RE | Admit: 2018-07-18 | Discharge: 2018-07-18 | Disposition: A | Discharge status: Home / Self Care | Payer: Medicare Other | Source: Ambulatory Visit | Attending: Radiation Oncology | Admitting: Radiation Oncology

## 2018-07-18 ENCOUNTER — Encounter: Payer: Self-pay | Admitting: Radiation Oncology

## 2018-07-18 ENCOUNTER — Other Ambulatory Visit: Payer: Self-pay

## 2018-07-18 DIAGNOSIS — Z85118 Personal history of other malignant neoplasm of bronchus and lung: Secondary | ICD-10-CM | POA: Diagnosis not present

## 2018-07-18 DIAGNOSIS — K219 Gastro-esophageal reflux disease without esophagitis: Secondary | ICD-10-CM | POA: Diagnosis not present

## 2018-07-18 DIAGNOSIS — M129 Arthropathy, unspecified: Secondary | ICD-10-CM | POA: Insufficient documentation

## 2018-07-18 DIAGNOSIS — Z992 Dependence on renal dialysis: Secondary | ICD-10-CM | POA: Diagnosis not present

## 2018-07-18 DIAGNOSIS — Z905 Acquired absence of kidney: Secondary | ICD-10-CM | POA: Diagnosis not present

## 2018-07-18 DIAGNOSIS — I120 Hypertensive chronic kidney disease with stage 5 chronic kidney disease or end stage renal disease: Secondary | ICD-10-CM | POA: Diagnosis not present

## 2018-07-18 DIAGNOSIS — Z87442 Personal history of urinary calculi: Secondary | ICD-10-CM | POA: Insufficient documentation

## 2018-07-18 DIAGNOSIS — Z85528 Personal history of other malignant neoplasm of kidney: Secondary | ICD-10-CM | POA: Insufficient documentation

## 2018-07-18 DIAGNOSIS — N400 Enlarged prostate without lower urinary tract symptoms: Secondary | ICD-10-CM | POA: Diagnosis not present

## 2018-07-18 DIAGNOSIS — C3411 Malignant neoplasm of upper lobe, right bronchus or lung: Secondary | ICD-10-CM | POA: Insufficient documentation

## 2018-07-18 DIAGNOSIS — Z902 Acquired absence of lung [part of]: Secondary | ICD-10-CM | POA: Diagnosis not present

## 2018-07-18 DIAGNOSIS — Z79899 Other long term (current) drug therapy: Secondary | ICD-10-CM | POA: Diagnosis not present

## 2018-07-18 DIAGNOSIS — G6181 Chronic inflammatory demyelinating polyneuritis: Secondary | ICD-10-CM | POA: Insufficient documentation

## 2018-07-18 DIAGNOSIS — N186 End stage renal disease: Secondary | ICD-10-CM | POA: Insufficient documentation

## 2018-07-18 DIAGNOSIS — J449 Chronic obstructive pulmonary disease, unspecified: Secondary | ICD-10-CM | POA: Diagnosis not present

## 2018-07-18 DIAGNOSIS — Z87891 Personal history of nicotine dependence: Secondary | ICD-10-CM | POA: Diagnosis not present

## 2018-07-18 DIAGNOSIS — G629 Polyneuropathy, unspecified: Secondary | ICD-10-CM | POA: Diagnosis not present

## 2018-07-18 HISTORY — DX: Chronic inflammatory demyelinating polyneuritis: G61.81

## 2018-07-19 DIAGNOSIS — N186 End stage renal disease: Secondary | ICD-10-CM | POA: Diagnosis not present

## 2018-07-19 DIAGNOSIS — D631 Anemia in chronic kidney disease: Secondary | ICD-10-CM | POA: Diagnosis not present

## 2018-07-19 DIAGNOSIS — D509 Iron deficiency anemia, unspecified: Secondary | ICD-10-CM | POA: Diagnosis not present

## 2018-07-19 DIAGNOSIS — Z992 Dependence on renal dialysis: Secondary | ICD-10-CM | POA: Diagnosis not present

## 2018-07-19 DIAGNOSIS — N2581 Secondary hyperparathyroidism of renal origin: Secondary | ICD-10-CM | POA: Diagnosis not present

## 2018-07-21 DIAGNOSIS — Z992 Dependence on renal dialysis: Secondary | ICD-10-CM | POA: Diagnosis not present

## 2018-07-21 DIAGNOSIS — N2581 Secondary hyperparathyroidism of renal origin: Secondary | ICD-10-CM | POA: Diagnosis not present

## 2018-07-21 DIAGNOSIS — N186 End stage renal disease: Secondary | ICD-10-CM | POA: Diagnosis not present

## 2018-07-21 DIAGNOSIS — D631 Anemia in chronic kidney disease: Secondary | ICD-10-CM | POA: Diagnosis not present

## 2018-07-21 DIAGNOSIS — D509 Iron deficiency anemia, unspecified: Secondary | ICD-10-CM | POA: Diagnosis not present

## 2018-07-24 DIAGNOSIS — Z992 Dependence on renal dialysis: Secondary | ICD-10-CM | POA: Diagnosis not present

## 2018-07-24 DIAGNOSIS — N186 End stage renal disease: Secondary | ICD-10-CM | POA: Diagnosis not present

## 2018-07-24 DIAGNOSIS — D509 Iron deficiency anemia, unspecified: Secondary | ICD-10-CM | POA: Diagnosis not present

## 2018-07-24 DIAGNOSIS — D631 Anemia in chronic kidney disease: Secondary | ICD-10-CM | POA: Diagnosis not present

## 2018-07-24 DIAGNOSIS — N2581 Secondary hyperparathyroidism of renal origin: Secondary | ICD-10-CM | POA: Diagnosis not present

## 2018-07-25 DIAGNOSIS — C3412 Malignant neoplasm of upper lobe, left bronchus or lung: Secondary | ICD-10-CM | POA: Diagnosis not present

## 2018-07-25 DIAGNOSIS — R911 Solitary pulmonary nodule: Secondary | ICD-10-CM | POA: Diagnosis not present

## 2018-07-26 DIAGNOSIS — N2581 Secondary hyperparathyroidism of renal origin: Secondary | ICD-10-CM | POA: Diagnosis not present

## 2018-07-26 DIAGNOSIS — Z992 Dependence on renal dialysis: Secondary | ICD-10-CM | POA: Diagnosis not present

## 2018-07-26 DIAGNOSIS — D631 Anemia in chronic kidney disease: Secondary | ICD-10-CM | POA: Diagnosis not present

## 2018-07-26 DIAGNOSIS — D509 Iron deficiency anemia, unspecified: Secondary | ICD-10-CM | POA: Diagnosis not present

## 2018-07-26 DIAGNOSIS — N186 End stage renal disease: Secondary | ICD-10-CM | POA: Diagnosis not present

## 2018-07-28 DIAGNOSIS — D631 Anemia in chronic kidney disease: Secondary | ICD-10-CM | POA: Diagnosis not present

## 2018-07-28 DIAGNOSIS — N2581 Secondary hyperparathyroidism of renal origin: Secondary | ICD-10-CM | POA: Diagnosis not present

## 2018-07-28 DIAGNOSIS — Z992 Dependence on renal dialysis: Secondary | ICD-10-CM | POA: Diagnosis not present

## 2018-07-28 DIAGNOSIS — D509 Iron deficiency anemia, unspecified: Secondary | ICD-10-CM | POA: Diagnosis not present

## 2018-07-28 DIAGNOSIS — N186 End stage renal disease: Secondary | ICD-10-CM | POA: Diagnosis not present

## 2018-07-31 DIAGNOSIS — N186 End stage renal disease: Secondary | ICD-10-CM | POA: Diagnosis not present

## 2018-07-31 DIAGNOSIS — D509 Iron deficiency anemia, unspecified: Secondary | ICD-10-CM | POA: Diagnosis not present

## 2018-07-31 DIAGNOSIS — Z992 Dependence on renal dialysis: Secondary | ICD-10-CM | POA: Diagnosis not present

## 2018-07-31 DIAGNOSIS — N2581 Secondary hyperparathyroidism of renal origin: Secondary | ICD-10-CM | POA: Diagnosis not present

## 2018-07-31 DIAGNOSIS — D631 Anemia in chronic kidney disease: Secondary | ICD-10-CM | POA: Diagnosis not present

## 2018-08-02 DIAGNOSIS — D631 Anemia in chronic kidney disease: Secondary | ICD-10-CM | POA: Diagnosis not present

## 2018-08-02 DIAGNOSIS — N2581 Secondary hyperparathyroidism of renal origin: Secondary | ICD-10-CM | POA: Diagnosis not present

## 2018-08-02 DIAGNOSIS — Z992 Dependence on renal dialysis: Secondary | ICD-10-CM | POA: Diagnosis not present

## 2018-08-02 DIAGNOSIS — N186 End stage renal disease: Secondary | ICD-10-CM | POA: Diagnosis not present

## 2018-08-02 DIAGNOSIS — D509 Iron deficiency anemia, unspecified: Secondary | ICD-10-CM | POA: Diagnosis not present

## 2018-08-03 ENCOUNTER — Ambulatory Visit
Admission: RE | Admit: 2018-08-03 | Discharge: 2018-08-03 | Disposition: A | Discharge status: Home / Self Care | Payer: Medicare Other | Source: Ambulatory Visit | Attending: Radiation Oncology | Admitting: Radiation Oncology

## 2018-08-03 ENCOUNTER — Other Ambulatory Visit: Payer: Self-pay

## 2018-08-03 DIAGNOSIS — Z902 Acquired absence of lung [part of]: Secondary | ICD-10-CM | POA: Diagnosis not present

## 2018-08-03 DIAGNOSIS — N189 Chronic kidney disease, unspecified: Secondary | ICD-10-CM | POA: Insufficient documentation

## 2018-08-03 DIAGNOSIS — Z87891 Personal history of nicotine dependence: Secondary | ICD-10-CM | POA: Insufficient documentation

## 2018-08-03 DIAGNOSIS — I129 Hypertensive chronic kidney disease with stage 1 through stage 4 chronic kidney disease, or unspecified chronic kidney disease: Secondary | ICD-10-CM | POA: Diagnosis not present

## 2018-08-03 DIAGNOSIS — Z9049 Acquired absence of other specified parts of digestive tract: Secondary | ICD-10-CM | POA: Diagnosis not present

## 2018-08-03 DIAGNOSIS — Z8249 Family history of ischemic heart disease and other diseases of the circulatory system: Secondary | ICD-10-CM | POA: Insufficient documentation

## 2018-08-03 DIAGNOSIS — Z8553 Personal history of malignant neoplasm of renal pelvis: Secondary | ICD-10-CM | POA: Insufficient documentation

## 2018-08-03 DIAGNOSIS — J439 Emphysema, unspecified: Secondary | ICD-10-CM | POA: Insufficient documentation

## 2018-08-03 DIAGNOSIS — Z96652 Presence of left artificial knee joint: Secondary | ICD-10-CM | POA: Diagnosis not present

## 2018-08-03 DIAGNOSIS — Z905 Acquired absence of kidney: Secondary | ICD-10-CM | POA: Insufficient documentation

## 2018-08-03 DIAGNOSIS — Z79899 Other long term (current) drug therapy: Secondary | ICD-10-CM | POA: Diagnosis not present

## 2018-08-03 DIAGNOSIS — Z992 Dependence on renal dialysis: Secondary | ICD-10-CM | POA: Diagnosis not present

## 2018-08-03 DIAGNOSIS — Z96641 Presence of right artificial hip joint: Secondary | ICD-10-CM | POA: Insufficient documentation

## 2018-08-03 DIAGNOSIS — C3411 Malignant neoplasm of upper lobe, right bronchus or lung: Secondary | ICD-10-CM

## 2018-08-03 DIAGNOSIS — K219 Gastro-esophageal reflux disease without esophagitis: Secondary | ICD-10-CM | POA: Diagnosis not present

## 2018-08-04 DIAGNOSIS — N2581 Secondary hyperparathyroidism of renal origin: Secondary | ICD-10-CM | POA: Diagnosis not present

## 2018-08-04 DIAGNOSIS — D631 Anemia in chronic kidney disease: Secondary | ICD-10-CM | POA: Diagnosis not present

## 2018-08-04 DIAGNOSIS — N186 End stage renal disease: Secondary | ICD-10-CM | POA: Diagnosis not present

## 2018-08-04 DIAGNOSIS — Z992 Dependence on renal dialysis: Secondary | ICD-10-CM | POA: Diagnosis not present

## 2018-08-04 DIAGNOSIS — D509 Iron deficiency anemia, unspecified: Secondary | ICD-10-CM | POA: Diagnosis not present

## 2018-08-07 DIAGNOSIS — D631 Anemia in chronic kidney disease: Secondary | ICD-10-CM | POA: Diagnosis not present

## 2018-08-07 DIAGNOSIS — N186 End stage renal disease: Secondary | ICD-10-CM | POA: Diagnosis not present

## 2018-08-07 DIAGNOSIS — N2581 Secondary hyperparathyroidism of renal origin: Secondary | ICD-10-CM | POA: Diagnosis not present

## 2018-08-07 DIAGNOSIS — Z992 Dependence on renal dialysis: Secondary | ICD-10-CM | POA: Diagnosis not present

## 2018-08-07 DIAGNOSIS — D509 Iron deficiency anemia, unspecified: Secondary | ICD-10-CM | POA: Diagnosis not present

## 2018-08-08 DIAGNOSIS — I129 Hypertensive chronic kidney disease with stage 1 through stage 4 chronic kidney disease, or unspecified chronic kidney disease: Secondary | ICD-10-CM | POA: Diagnosis not present

## 2018-08-08 DIAGNOSIS — Z87891 Personal history of nicotine dependence: Secondary | ICD-10-CM | POA: Diagnosis not present

## 2018-08-08 DIAGNOSIS — Z8553 Personal history of malignant neoplasm of renal pelvis: Secondary | ICD-10-CM | POA: Diagnosis not present

## 2018-08-08 DIAGNOSIS — Z905 Acquired absence of kidney: Secondary | ICD-10-CM | POA: Diagnosis not present

## 2018-08-08 DIAGNOSIS — C3411 Malignant neoplasm of upper lobe, right bronchus or lung: Secondary | ICD-10-CM | POA: Diagnosis not present

## 2018-08-08 DIAGNOSIS — Z902 Acquired absence of lung [part of]: Secondary | ICD-10-CM | POA: Diagnosis not present

## 2018-08-09 DIAGNOSIS — N2581 Secondary hyperparathyroidism of renal origin: Secondary | ICD-10-CM | POA: Diagnosis not present

## 2018-08-09 DIAGNOSIS — Z992 Dependence on renal dialysis: Secondary | ICD-10-CM | POA: Diagnosis not present

## 2018-08-09 DIAGNOSIS — D509 Iron deficiency anemia, unspecified: Secondary | ICD-10-CM | POA: Diagnosis not present

## 2018-08-09 DIAGNOSIS — D631 Anemia in chronic kidney disease: Secondary | ICD-10-CM | POA: Diagnosis not present

## 2018-08-09 DIAGNOSIS — N186 End stage renal disease: Secondary | ICD-10-CM | POA: Diagnosis not present

## 2018-08-11 DIAGNOSIS — D509 Iron deficiency anemia, unspecified: Secondary | ICD-10-CM | POA: Diagnosis not present

## 2018-08-11 DIAGNOSIS — D631 Anemia in chronic kidney disease: Secondary | ICD-10-CM | POA: Diagnosis not present

## 2018-08-11 DIAGNOSIS — N186 End stage renal disease: Secondary | ICD-10-CM | POA: Diagnosis not present

## 2018-08-11 DIAGNOSIS — N2581 Secondary hyperparathyroidism of renal origin: Secondary | ICD-10-CM | POA: Diagnosis not present

## 2018-08-11 DIAGNOSIS — Z992 Dependence on renal dialysis: Secondary | ICD-10-CM | POA: Diagnosis not present

## 2018-08-14 DIAGNOSIS — N186 End stage renal disease: Secondary | ICD-10-CM | POA: Diagnosis not present

## 2018-08-14 DIAGNOSIS — D509 Iron deficiency anemia, unspecified: Secondary | ICD-10-CM | POA: Diagnosis not present

## 2018-08-14 DIAGNOSIS — N2581 Secondary hyperparathyroidism of renal origin: Secondary | ICD-10-CM | POA: Diagnosis not present

## 2018-08-14 DIAGNOSIS — D631 Anemia in chronic kidney disease: Secondary | ICD-10-CM | POA: Diagnosis not present

## 2018-08-14 DIAGNOSIS — Z992 Dependence on renal dialysis: Secondary | ICD-10-CM | POA: Diagnosis not present

## 2018-08-15 ENCOUNTER — Other Ambulatory Visit: Payer: Self-pay

## 2018-08-15 ENCOUNTER — Ambulatory Visit
Admission: RE | Admit: 2018-08-15 | Discharge: 2018-08-15 | Disposition: A | Discharge status: Home / Self Care | Payer: Medicare Other | Source: Ambulatory Visit | Attending: Radiation Oncology | Admitting: Radiation Oncology

## 2018-08-15 DIAGNOSIS — Z905 Acquired absence of kidney: Secondary | ICD-10-CM | POA: Insufficient documentation

## 2018-08-15 DIAGNOSIS — K219 Gastro-esophageal reflux disease without esophagitis: Secondary | ICD-10-CM | POA: Diagnosis not present

## 2018-08-15 DIAGNOSIS — Z96641 Presence of right artificial hip joint: Secondary | ICD-10-CM | POA: Diagnosis not present

## 2018-08-15 DIAGNOSIS — Z87891 Personal history of nicotine dependence: Secondary | ICD-10-CM | POA: Insufficient documentation

## 2018-08-15 DIAGNOSIS — Z992 Dependence on renal dialysis: Secondary | ICD-10-CM | POA: Diagnosis not present

## 2018-08-15 DIAGNOSIS — Z902 Acquired absence of lung [part of]: Secondary | ICD-10-CM | POA: Insufficient documentation

## 2018-08-15 DIAGNOSIS — Z9049 Acquired absence of other specified parts of digestive tract: Secondary | ICD-10-CM | POA: Insufficient documentation

## 2018-08-15 DIAGNOSIS — N189 Chronic kidney disease, unspecified: Secondary | ICD-10-CM | POA: Insufficient documentation

## 2018-08-15 DIAGNOSIS — J439 Emphysema, unspecified: Secondary | ICD-10-CM | POA: Diagnosis not present

## 2018-08-15 DIAGNOSIS — I129 Hypertensive chronic kidney disease with stage 1 through stage 4 chronic kidney disease, or unspecified chronic kidney disease: Secondary | ICD-10-CM | POA: Insufficient documentation

## 2018-08-15 DIAGNOSIS — Z79899 Other long term (current) drug therapy: Secondary | ICD-10-CM | POA: Insufficient documentation

## 2018-08-15 DIAGNOSIS — Z8553 Personal history of malignant neoplasm of renal pelvis: Secondary | ICD-10-CM | POA: Insufficient documentation

## 2018-08-15 DIAGNOSIS — C3411 Malignant neoplasm of upper lobe, right bronchus or lung: Secondary | ICD-10-CM | POA: Diagnosis not present

## 2018-08-15 DIAGNOSIS — Z8249 Family history of ischemic heart disease and other diseases of the circulatory system: Secondary | ICD-10-CM | POA: Diagnosis not present

## 2018-08-15 DIAGNOSIS — Z96652 Presence of left artificial knee joint: Secondary | ICD-10-CM | POA: Diagnosis not present

## 2018-08-16 ENCOUNTER — Ambulatory Visit: Payer: Medicare Other | Admitting: Radiation Oncology

## 2018-08-16 DIAGNOSIS — D631 Anemia in chronic kidney disease: Secondary | ICD-10-CM | POA: Diagnosis not present

## 2018-08-16 DIAGNOSIS — D509 Iron deficiency anemia, unspecified: Secondary | ICD-10-CM | POA: Diagnosis not present

## 2018-08-16 DIAGNOSIS — N186 End stage renal disease: Secondary | ICD-10-CM | POA: Diagnosis not present

## 2018-08-16 DIAGNOSIS — N2581 Secondary hyperparathyroidism of renal origin: Secondary | ICD-10-CM | POA: Diagnosis not present

## 2018-08-16 DIAGNOSIS — Z992 Dependence on renal dialysis: Secondary | ICD-10-CM | POA: Diagnosis not present

## 2018-08-17 ENCOUNTER — Ambulatory Visit: Payer: Medicare Other | Admitting: Radiation Oncology

## 2018-08-17 ENCOUNTER — Ambulatory Visit
Admission: RE | Admit: 2018-08-17 | Discharge: 2018-08-17 | Disposition: A | Discharge status: Home / Self Care | Payer: Medicare Other | Source: Ambulatory Visit | Attending: Radiation Oncology | Admitting: Radiation Oncology

## 2018-08-17 ENCOUNTER — Other Ambulatory Visit: Payer: Self-pay

## 2018-08-17 DIAGNOSIS — Z87891 Personal history of nicotine dependence: Secondary | ICD-10-CM | POA: Diagnosis not present

## 2018-08-17 DIAGNOSIS — C3411 Malignant neoplasm of upper lobe, right bronchus or lung: Secondary | ICD-10-CM | POA: Diagnosis not present

## 2018-08-17 DIAGNOSIS — Z8553 Personal history of malignant neoplasm of renal pelvis: Secondary | ICD-10-CM | POA: Diagnosis not present

## 2018-08-17 DIAGNOSIS — Z905 Acquired absence of kidney: Secondary | ICD-10-CM | POA: Diagnosis not present

## 2018-08-17 DIAGNOSIS — Z902 Acquired absence of lung [part of]: Secondary | ICD-10-CM | POA: Diagnosis not present

## 2018-08-17 DIAGNOSIS — I129 Hypertensive chronic kidney disease with stage 1 through stage 4 chronic kidney disease, or unspecified chronic kidney disease: Secondary | ICD-10-CM | POA: Diagnosis not present

## 2018-08-18 ENCOUNTER — Ambulatory Visit: Payer: Medicare Other | Admitting: Radiation Oncology

## 2018-08-18 DIAGNOSIS — N186 End stage renal disease: Secondary | ICD-10-CM | POA: Diagnosis not present

## 2018-08-18 DIAGNOSIS — Z992 Dependence on renal dialysis: Secondary | ICD-10-CM | POA: Diagnosis not present

## 2018-08-18 DIAGNOSIS — N2581 Secondary hyperparathyroidism of renal origin: Secondary | ICD-10-CM | POA: Diagnosis not present

## 2018-08-18 DIAGNOSIS — D631 Anemia in chronic kidney disease: Secondary | ICD-10-CM | POA: Diagnosis not present

## 2018-08-18 DIAGNOSIS — D509 Iron deficiency anemia, unspecified: Secondary | ICD-10-CM | POA: Diagnosis not present

## 2018-08-21 ENCOUNTER — Ambulatory Visit: Payer: Medicare Other | Admitting: Radiation Oncology

## 2018-08-21 DIAGNOSIS — N2581 Secondary hyperparathyroidism of renal origin: Secondary | ICD-10-CM | POA: Diagnosis not present

## 2018-08-21 DIAGNOSIS — D631 Anemia in chronic kidney disease: Secondary | ICD-10-CM | POA: Diagnosis not present

## 2018-08-21 DIAGNOSIS — D509 Iron deficiency anemia, unspecified: Secondary | ICD-10-CM | POA: Diagnosis not present

## 2018-08-21 DIAGNOSIS — N186 End stage renal disease: Secondary | ICD-10-CM | POA: Diagnosis not present

## 2018-08-21 DIAGNOSIS — Z992 Dependence on renal dialysis: Secondary | ICD-10-CM | POA: Diagnosis not present

## 2018-08-22 ENCOUNTER — Telehealth: Payer: Self-pay | Admitting: Radiation Oncology

## 2018-08-22 ENCOUNTER — Other Ambulatory Visit: Payer: Self-pay

## 2018-08-22 ENCOUNTER — Encounter: Payer: Self-pay | Admitting: Radiation Oncology

## 2018-08-22 ENCOUNTER — Ambulatory Visit
Admission: RE | Admit: 2018-08-22 | Discharge: 2018-08-22 | Disposition: A | Discharge status: Home / Self Care | Payer: Medicare Other | Source: Ambulatory Visit | Attending: Radiation Oncology | Admitting: Radiation Oncology

## 2018-08-22 DIAGNOSIS — Z87891 Personal history of nicotine dependence: Secondary | ICD-10-CM | POA: Diagnosis not present

## 2018-08-22 DIAGNOSIS — C3411 Malignant neoplasm of upper lobe, right bronchus or lung: Secondary | ICD-10-CM

## 2018-08-22 DIAGNOSIS — Z8553 Personal history of malignant neoplasm of renal pelvis: Secondary | ICD-10-CM | POA: Diagnosis not present

## 2018-08-22 DIAGNOSIS — I129 Hypertensive chronic kidney disease with stage 1 through stage 4 chronic kidney disease, or unspecified chronic kidney disease: Secondary | ICD-10-CM | POA: Diagnosis not present

## 2018-08-22 DIAGNOSIS — Z902 Acquired absence of lung [part of]: Secondary | ICD-10-CM | POA: Diagnosis not present

## 2018-08-22 DIAGNOSIS — Z905 Acquired absence of kidney: Secondary | ICD-10-CM | POA: Diagnosis not present

## 2018-08-22 NOTE — Telephone Encounter (Signed)
I saw to take patient today following his last fraction of radiotherapy.  In summary this is a gentleman with a history of renal cell carcinoma who remains on hemodialysis Monday Wednesday Friday who was recently diagnosed with a squamous cell carcinoma in the lung, and received SBRT as he was not felt to be a surgical candidate.  I discussed with he and with his niece by phone today, after seeing the patient in clinic that he appears to be doing well.  He does continue to have some chronic shortness of breath at baseline, and is encouraged to follow-up with cardiology as he has had some recent changes in his symptoms which have previously coincided with his history of arrhythmia.  I discussed that we would plan on getting a repeat CT of the chest in approximately 6 weeks time.  Following this, he prefers to continue with Dr. Tressie Stalker given their previous rapport.  I let him know that the recommendations for following lung cancer includes a CT of the chest every 6 months for the first 5 years after this type of treatment.  Given his history of kidney cancer that could include additional imaging.  I suspect that the patient will not need additional body imaging until later this year given his previous PET in February 2020, though that would be discussion that he would continue with Dr. Tressie Stalker.  I will call the patient with the results of his scan that will be performed in [facility].    Carola Rhine, PAC

## 2018-08-23 DIAGNOSIS — D631 Anemia in chronic kidney disease: Secondary | ICD-10-CM | POA: Diagnosis not present

## 2018-08-23 DIAGNOSIS — N186 End stage renal disease: Secondary | ICD-10-CM | POA: Diagnosis not present

## 2018-08-23 DIAGNOSIS — N2581 Secondary hyperparathyroidism of renal origin: Secondary | ICD-10-CM | POA: Diagnosis not present

## 2018-08-23 DIAGNOSIS — Z992 Dependence on renal dialysis: Secondary | ICD-10-CM | POA: Diagnosis not present

## 2018-08-23 DIAGNOSIS — D509 Iron deficiency anemia, unspecified: Secondary | ICD-10-CM | POA: Diagnosis not present

## 2018-08-25 DIAGNOSIS — N2581 Secondary hyperparathyroidism of renal origin: Secondary | ICD-10-CM | POA: Diagnosis not present

## 2018-08-25 DIAGNOSIS — D631 Anemia in chronic kidney disease: Secondary | ICD-10-CM | POA: Diagnosis not present

## 2018-08-25 DIAGNOSIS — Z992 Dependence on renal dialysis: Secondary | ICD-10-CM | POA: Diagnosis not present

## 2018-08-25 DIAGNOSIS — N186 End stage renal disease: Secondary | ICD-10-CM | POA: Diagnosis not present

## 2018-08-25 DIAGNOSIS — D509 Iron deficiency anemia, unspecified: Secondary | ICD-10-CM | POA: Diagnosis not present

## 2018-08-28 DIAGNOSIS — D631 Anemia in chronic kidney disease: Secondary | ICD-10-CM | POA: Diagnosis not present

## 2018-08-28 DIAGNOSIS — N186 End stage renal disease: Secondary | ICD-10-CM | POA: Diagnosis not present

## 2018-08-28 DIAGNOSIS — D509 Iron deficiency anemia, unspecified: Secondary | ICD-10-CM | POA: Diagnosis not present

## 2018-08-28 DIAGNOSIS — Z992 Dependence on renal dialysis: Secondary | ICD-10-CM | POA: Diagnosis not present

## 2018-08-28 DIAGNOSIS — N2581 Secondary hyperparathyroidism of renal origin: Secondary | ICD-10-CM | POA: Diagnosis not present

## 2018-08-30 DIAGNOSIS — Z992 Dependence on renal dialysis: Secondary | ICD-10-CM | POA: Diagnosis not present

## 2018-08-30 DIAGNOSIS — D631 Anemia in chronic kidney disease: Secondary | ICD-10-CM | POA: Diagnosis not present

## 2018-08-30 DIAGNOSIS — N2581 Secondary hyperparathyroidism of renal origin: Secondary | ICD-10-CM | POA: Diagnosis not present

## 2018-08-30 DIAGNOSIS — D509 Iron deficiency anemia, unspecified: Secondary | ICD-10-CM | POA: Diagnosis not present

## 2018-08-30 DIAGNOSIS — N186 End stage renal disease: Secondary | ICD-10-CM | POA: Diagnosis not present

## 2018-09-01 DIAGNOSIS — Z992 Dependence on renal dialysis: Secondary | ICD-10-CM | POA: Diagnosis not present

## 2018-09-01 DIAGNOSIS — N2581 Secondary hyperparathyroidism of renal origin: Secondary | ICD-10-CM | POA: Diagnosis not present

## 2018-09-01 DIAGNOSIS — D509 Iron deficiency anemia, unspecified: Secondary | ICD-10-CM | POA: Diagnosis not present

## 2018-09-01 DIAGNOSIS — N186 End stage renal disease: Secondary | ICD-10-CM | POA: Diagnosis not present

## 2018-09-01 DIAGNOSIS — D631 Anemia in chronic kidney disease: Secondary | ICD-10-CM | POA: Diagnosis not present

## 2018-09-02 DIAGNOSIS — D509 Iron deficiency anemia, unspecified: Secondary | ICD-10-CM | POA: Diagnosis not present

## 2018-09-02 DIAGNOSIS — N2581 Secondary hyperparathyroidism of renal origin: Secondary | ICD-10-CM | POA: Diagnosis not present

## 2018-09-02 DIAGNOSIS — N186 End stage renal disease: Secondary | ICD-10-CM | POA: Diagnosis not present

## 2018-09-02 DIAGNOSIS — Z992 Dependence on renal dialysis: Secondary | ICD-10-CM | POA: Diagnosis not present

## 2018-09-02 DIAGNOSIS — D631 Anemia in chronic kidney disease: Secondary | ICD-10-CM | POA: Diagnosis not present

## 2018-09-04 DIAGNOSIS — Z992 Dependence on renal dialysis: Secondary | ICD-10-CM | POA: Diagnosis not present

## 2018-09-04 DIAGNOSIS — N186 End stage renal disease: Secondary | ICD-10-CM | POA: Diagnosis not present

## 2018-09-04 DIAGNOSIS — D631 Anemia in chronic kidney disease: Secondary | ICD-10-CM | POA: Diagnosis not present

## 2018-09-04 DIAGNOSIS — D509 Iron deficiency anemia, unspecified: Secondary | ICD-10-CM | POA: Diagnosis not present

## 2018-09-04 DIAGNOSIS — N2581 Secondary hyperparathyroidism of renal origin: Secondary | ICD-10-CM | POA: Diagnosis not present

## 2018-09-06 DIAGNOSIS — Z992 Dependence on renal dialysis: Secondary | ICD-10-CM | POA: Diagnosis not present

## 2018-09-06 DIAGNOSIS — D509 Iron deficiency anemia, unspecified: Secondary | ICD-10-CM | POA: Diagnosis not present

## 2018-09-06 DIAGNOSIS — N2581 Secondary hyperparathyroidism of renal origin: Secondary | ICD-10-CM | POA: Diagnosis not present

## 2018-09-06 DIAGNOSIS — N186 End stage renal disease: Secondary | ICD-10-CM | POA: Diagnosis not present

## 2018-09-06 DIAGNOSIS — D631 Anemia in chronic kidney disease: Secondary | ICD-10-CM | POA: Diagnosis not present

## 2018-09-07 NOTE — Progress Notes (Signed)
[place] Radiation Oncology Simulation and Treatment Planning Note   Name:  [name] MRN: [id number]   Date: 08/03/2018  DOB: 09/12/1930  Status:outpatient    DIAGNOSIS:    ICD-10-CM   1. Malignant neoplasm of right upper lobe of lung ([place]) C34.11      CONSENT VERIFIED:yes   SET UP: Patient is setup supine   IMMOBILIZATION: The patient was immobilized using a customized Vac Loc bag/ blue bag and customized accuform device   NARRATIVE:The patient was brought to the [facility].  Identity was confirmed.  All relevant records and images related to the planned course of therapy were reviewed.  Then, the patient was positioned in a stable reproducible clinical set-up for radiation therapy. Abdominal compression was applied by me.  4D CT images were obtained and reproducible breathing pattern was confirmed. Free breathing CT images were obtained.  Skin markings were placed.  The CT images were loaded into the planning software where the target and avoidance structures were contoured.  The radiation prescription was entered and confirmed.    TREATMENT PLANNING NOTE:  Treatment planning then occurred. I have requested : IMRT planning.This treatment technique is medically necessary due to the high-dose of radiation delivered to the target region which is in close proximity to adjacent critical normal structures.  3 dimensional simulation is performed and dose volume histogram of the gross tumor volume, planning tumor volume and criticial normal structures including the spinal cord and lungs were analyzed and requested.  Special treatment procedure was performed due to high dose per fraction.  The patient will be monitored for increased risk of toxicity.  Daily imaging using cone beam CT will be used for target localization.  I anticipate that the patient will receive 54 Gy in 3 fractions to target volume. Further adjustments will be made based on the  planning process is necessary.  ------------------------------------------------  Jodelle Gross, MD, PhD

## 2018-09-08 DIAGNOSIS — N186 End stage renal disease: Secondary | ICD-10-CM | POA: Diagnosis not present

## 2018-09-08 DIAGNOSIS — D509 Iron deficiency anemia, unspecified: Secondary | ICD-10-CM | POA: Diagnosis not present

## 2018-09-08 DIAGNOSIS — Z992 Dependence on renal dialysis: Secondary | ICD-10-CM | POA: Diagnosis not present

## 2018-09-08 DIAGNOSIS — N2581 Secondary hyperparathyroidism of renal origin: Secondary | ICD-10-CM | POA: Diagnosis not present

## 2018-09-08 DIAGNOSIS — D631 Anemia in chronic kidney disease: Secondary | ICD-10-CM | POA: Diagnosis not present

## 2018-09-11 DIAGNOSIS — N186 End stage renal disease: Secondary | ICD-10-CM | POA: Diagnosis not present

## 2018-09-11 DIAGNOSIS — D509 Iron deficiency anemia, unspecified: Secondary | ICD-10-CM | POA: Diagnosis not present

## 2018-09-11 DIAGNOSIS — N2581 Secondary hyperparathyroidism of renal origin: Secondary | ICD-10-CM | POA: Diagnosis not present

## 2018-09-11 DIAGNOSIS — Z992 Dependence on renal dialysis: Secondary | ICD-10-CM | POA: Diagnosis not present

## 2018-09-11 DIAGNOSIS — D631 Anemia in chronic kidney disease: Secondary | ICD-10-CM | POA: Diagnosis not present

## 2018-09-13 DIAGNOSIS — Z992 Dependence on renal dialysis: Secondary | ICD-10-CM | POA: Diagnosis not present

## 2018-09-13 DIAGNOSIS — D631 Anemia in chronic kidney disease: Secondary | ICD-10-CM | POA: Diagnosis not present

## 2018-09-13 DIAGNOSIS — D509 Iron deficiency anemia, unspecified: Secondary | ICD-10-CM | POA: Diagnosis not present

## 2018-09-13 DIAGNOSIS — N2581 Secondary hyperparathyroidism of renal origin: Secondary | ICD-10-CM | POA: Diagnosis not present

## 2018-09-13 DIAGNOSIS — N186 End stage renal disease: Secondary | ICD-10-CM | POA: Diagnosis not present

## 2018-09-15 DIAGNOSIS — D509 Iron deficiency anemia, unspecified: Secondary | ICD-10-CM | POA: Diagnosis not present

## 2018-09-15 DIAGNOSIS — Z992 Dependence on renal dialysis: Secondary | ICD-10-CM | POA: Diagnosis not present

## 2018-09-15 DIAGNOSIS — N186 End stage renal disease: Secondary | ICD-10-CM | POA: Diagnosis not present

## 2018-09-15 DIAGNOSIS — D631 Anemia in chronic kidney disease: Secondary | ICD-10-CM | POA: Diagnosis not present

## 2018-09-15 DIAGNOSIS — N2581 Secondary hyperparathyroidism of renal origin: Secondary | ICD-10-CM | POA: Diagnosis not present

## 2018-09-18 DIAGNOSIS — D631 Anemia in chronic kidney disease: Secondary | ICD-10-CM | POA: Diagnosis not present

## 2018-09-18 DIAGNOSIS — D509 Iron deficiency anemia, unspecified: Secondary | ICD-10-CM | POA: Diagnosis not present

## 2018-09-18 DIAGNOSIS — N186 End stage renal disease: Secondary | ICD-10-CM | POA: Diagnosis not present

## 2018-09-18 DIAGNOSIS — Z992 Dependence on renal dialysis: Secondary | ICD-10-CM | POA: Diagnosis not present

## 2018-09-18 DIAGNOSIS — N2581 Secondary hyperparathyroidism of renal origin: Secondary | ICD-10-CM | POA: Diagnosis not present

## 2018-09-20 ENCOUNTER — Telehealth: Payer: Self-pay | Admitting: Radiation Oncology

## 2018-09-20 DIAGNOSIS — N186 End stage renal disease: Secondary | ICD-10-CM | POA: Diagnosis not present

## 2018-09-20 DIAGNOSIS — Z992 Dependence on renal dialysis: Secondary | ICD-10-CM | POA: Diagnosis not present

## 2018-09-20 DIAGNOSIS — N2581 Secondary hyperparathyroidism of renal origin: Secondary | ICD-10-CM | POA: Diagnosis not present

## 2018-09-20 DIAGNOSIS — D509 Iron deficiency anemia, unspecified: Secondary | ICD-10-CM | POA: Diagnosis not present

## 2018-09-20 DIAGNOSIS — D631 Anemia in chronic kidney disease: Secondary | ICD-10-CM | POA: Diagnosis not present

## 2018-09-20 NOTE — Telephone Encounter (Signed)
The patient's niece called and wasn't sure who had called [name] because he could not hear the message well. I suspect it was to schedule his MRI. I will speak with our schedulers and ask that they call his niece [name] to coordinate his appointments. I'll call her as well with his results.

## 2018-09-21 ENCOUNTER — Telehealth: Payer: Self-pay | Admitting: *Deleted

## 2018-09-21 NOTE — Telephone Encounter (Signed)
CALLED PATIENT'S NIECE, [name] UNDERWOOD TO INFORM OF CT FOR 10-03-18 - ARRIVAL TIME- 1:45 PM @ [place] [facility], NO RESTRICTIONS TO TEST, SPOKE WITH PATIENT'S NIECE - [name] UNDERWOOD AND SHE IS AWARE OF THIS TEST

## 2018-09-22 DIAGNOSIS — N2581 Secondary hyperparathyroidism of renal origin: Secondary | ICD-10-CM | POA: Diagnosis not present

## 2018-09-22 DIAGNOSIS — N186 End stage renal disease: Secondary | ICD-10-CM | POA: Diagnosis not present

## 2018-09-22 DIAGNOSIS — D509 Iron deficiency anemia, unspecified: Secondary | ICD-10-CM | POA: Diagnosis not present

## 2018-09-22 DIAGNOSIS — Z992 Dependence on renal dialysis: Secondary | ICD-10-CM | POA: Diagnosis not present

## 2018-09-22 DIAGNOSIS — D631 Anemia in chronic kidney disease: Secondary | ICD-10-CM | POA: Diagnosis not present

## 2018-09-25 DIAGNOSIS — Z992 Dependence on renal dialysis: Secondary | ICD-10-CM | POA: Diagnosis not present

## 2018-09-25 DIAGNOSIS — N2581 Secondary hyperparathyroidism of renal origin: Secondary | ICD-10-CM | POA: Diagnosis not present

## 2018-09-25 DIAGNOSIS — D631 Anemia in chronic kidney disease: Secondary | ICD-10-CM | POA: Diagnosis not present

## 2018-09-25 DIAGNOSIS — N186 End stage renal disease: Secondary | ICD-10-CM | POA: Diagnosis not present

## 2018-09-25 DIAGNOSIS — D509 Iron deficiency anemia, unspecified: Secondary | ICD-10-CM | POA: Diagnosis not present

## 2018-09-26 DIAGNOSIS — Z1389 Encounter for screening for other disorder: Secondary | ICD-10-CM | POA: Diagnosis not present

## 2018-09-26 DIAGNOSIS — Z0001 Encounter for general adult medical examination with abnormal findings: Secondary | ICD-10-CM | POA: Diagnosis not present

## 2018-09-26 DIAGNOSIS — Z681 Body mass index (BMI) 19 or less, adult: Secondary | ICD-10-CM | POA: Diagnosis not present

## 2018-09-27 DIAGNOSIS — N2581 Secondary hyperparathyroidism of renal origin: Secondary | ICD-10-CM | POA: Diagnosis not present

## 2018-09-27 DIAGNOSIS — D509 Iron deficiency anemia, unspecified: Secondary | ICD-10-CM | POA: Diagnosis not present

## 2018-09-27 DIAGNOSIS — N186 End stage renal disease: Secondary | ICD-10-CM | POA: Diagnosis not present

## 2018-09-27 DIAGNOSIS — Z992 Dependence on renal dialysis: Secondary | ICD-10-CM | POA: Diagnosis not present

## 2018-09-27 DIAGNOSIS — D631 Anemia in chronic kidney disease: Secondary | ICD-10-CM | POA: Diagnosis not present

## 2018-09-28 NOTE — Progress Notes (Signed)
  Radiation Oncology         [phone] ________________________________  Name: [name] MRN: [id number]  Date: 08/22/2018  DOB: Nov 05, 1930  End of Treatment Note  Diagnosis:   85 y.o. male with Stage IA2, cT1bN0M0 NSCLC, squamous cell carcinoma of the RUL  Indication for treatment:  Curative       Radiation treatment dates:   08/15/2018, 08/17/2018, 08/22/2018  Site/dose:   The tumor in the right upper lobe was treated with a course of stereotactic body radiation treatment. The patient received 54 Gy in 3 fractions at 18 Gy per fraction.  Beams/energy:   SBRT/SRT-VMAT // 6X-FFF Photon  Narrative: The patient tolerated radiation treatment relatively well.   The patient did not have any signs of acute toxicity during treatment.  Plan: The patient has completed radiation treatment. The patient will return to radiation oncology clinic for routine followup in one month. I advised the patient to call or return sooner if they have any questions or concerns related to their recovery or treatment.   ------------------------------------------------  Jodelle Gross, MD, PhD  This document serves as a record of services personally performed by Kyung Rudd, MD. It was created on his behalf by Rae Lips, a trained medical scribe. The creation of this record is based on the scribe's personal observations and the provider's statements to them. This document has been checked and approved by the attending provider.

## 2018-09-29 DIAGNOSIS — D509 Iron deficiency anemia, unspecified: Secondary | ICD-10-CM | POA: Diagnosis not present

## 2018-09-29 DIAGNOSIS — D631 Anemia in chronic kidney disease: Secondary | ICD-10-CM | POA: Diagnosis not present

## 2018-09-29 DIAGNOSIS — Z992 Dependence on renal dialysis: Secondary | ICD-10-CM | POA: Diagnosis not present

## 2018-09-29 DIAGNOSIS — N186 End stage renal disease: Secondary | ICD-10-CM | POA: Diagnosis not present

## 2018-09-29 DIAGNOSIS — N2581 Secondary hyperparathyroidism of renal origin: Secondary | ICD-10-CM | POA: Diagnosis not present

## 2018-10-01 ENCOUNTER — Emergency Department (HOSPITAL_COMMUNITY)
Admission: EM | Admit: 2018-10-01 | Discharge: 2018-10-01 | Disposition: A | Discharge status: Home / Self Care | Payer: Medicare Other | Attending: Emergency Medicine | Admitting: Emergency Medicine

## 2018-10-01 ENCOUNTER — Other Ambulatory Visit: Payer: Self-pay

## 2018-10-01 ENCOUNTER — Emergency Department (HOSPITAL_COMMUNITY): Payer: Medicare Other

## 2018-10-01 ENCOUNTER — Encounter (HOSPITAL_COMMUNITY): Payer: Self-pay | Admitting: Emergency Medicine

## 2018-10-01 DIAGNOSIS — J449 Chronic obstructive pulmonary disease, unspecified: Secondary | ICD-10-CM | POA: Insufficient documentation

## 2018-10-01 DIAGNOSIS — Z992 Dependence on renal dialysis: Secondary | ICD-10-CM | POA: Diagnosis not present

## 2018-10-01 DIAGNOSIS — Z85118 Personal history of other malignant neoplasm of bronchus and lung: Secondary | ICD-10-CM | POA: Diagnosis not present

## 2018-10-01 DIAGNOSIS — Z96652 Presence of left artificial knee joint: Secondary | ICD-10-CM | POA: Insufficient documentation

## 2018-10-01 DIAGNOSIS — Z96641 Presence of right artificial hip joint: Secondary | ICD-10-CM | POA: Diagnosis not present

## 2018-10-01 DIAGNOSIS — Z87891 Personal history of nicotine dependence: Secondary | ICD-10-CM | POA: Diagnosis not present

## 2018-10-01 DIAGNOSIS — W1842XA Slipping, tripping and stumbling without falling due to stepping into hole or opening, initial encounter: Secondary | ICD-10-CM | POA: Insufficient documentation

## 2018-10-01 DIAGNOSIS — I120 Hypertensive chronic kidney disease with stage 5 chronic kidney disease or end stage renal disease: Secondary | ICD-10-CM | POA: Insufficient documentation

## 2018-10-01 DIAGNOSIS — N186 End stage renal disease: Secondary | ICD-10-CM | POA: Diagnosis not present

## 2018-10-01 DIAGNOSIS — Z79899 Other long term (current) drug therapy: Secondary | ICD-10-CM | POA: Insufficient documentation

## 2018-10-01 DIAGNOSIS — M25551 Pain in right hip: Secondary | ICD-10-CM | POA: Diagnosis not present

## 2018-10-01 DIAGNOSIS — Y999 Unspecified external cause status: Secondary | ICD-10-CM | POA: Diagnosis not present

## 2018-10-01 DIAGNOSIS — Y929 Unspecified place or not applicable: Secondary | ICD-10-CM | POA: Diagnosis not present

## 2018-10-01 DIAGNOSIS — W19XXXA Unspecified fall, initial encounter: Secondary | ICD-10-CM

## 2018-10-01 DIAGNOSIS — Y939 Activity, unspecified: Secondary | ICD-10-CM | POA: Insufficient documentation

## 2018-10-01 MED ORDER — ACETAMINOPHEN 325 MG PO TABS
650.0000 mg | ORAL_TABLET | Freq: Once | ORAL | Status: DC
  Filled 2018-10-01: qty 2

## 2018-10-01 NOTE — ED Notes (Signed)
Reviewed discharge with [name], niece POA by phone [phone].

## 2018-10-01 NOTE — Discharge Instructions (Signed)
Take Tylenol for pain as needed Please follow up with Dr. Ronnie Derby if you continue to have hip pain Return if worsening

## 2018-10-01 NOTE — ED Notes (Signed)
Pt ambulated by Tori, NT, tolerated well.

## 2018-10-01 NOTE — ED Provider Notes (Signed)
[facility] EMERGENCY DEPARTMENT Provider Note   CSN: [id number] Arrival date & time: 10/01/18  9371    History   Chief Complaint Chief Complaint  Patient presents with  . Fall    HPI [name] HOUSMAN is a 85 y.o. male who presents with a fall and right hip pain.  Past medical history significant for end-stage renal disease on dialysis Monday Wednesday Friday, lung and renal cancer.  Patient reports that he was stepping on a log yesterday and tripped and fell onto his right side.  He was able to get up on his own.  He reports pain in his right hip and upper thigh when he walks and when he lifts his leg.  He does not have any pain at rest.  He has had a right partial hip replacement years ago.  He has not taken anything for pain.  He denies loss of consciousness, dizziness, nausea, vomiting, chest pain, shortness of breath.  He has some mild pain in his right shoulder and left pinky finger but states that it just feels sore and he is not worried about it.  He is not on blood thinners.     HPI  Past Medical History:  Diagnosis Date  . Arthritis    ankle , hip, knees , low back   . BPH (benign prostatic hypertrophy)   . Cancer ([place])    lung  . Cancer ([place]) 2001   renal  . 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 /w prednisone x 17 yrs. has been d/c for 3 yrs.   . Chronic kidney disease       . CIDP (chronic inflammatory demyelinating polyneuropathy) ([place])   . COPD (chronic obstructive pulmonary disease) ([place])    told that he has "a bit of emphysema"  . Dialysis patient ([place])   . GERD (gastroesophageal reflux disease)   . Hypertension    stress test done - many years ago  . Kidney calculus   . Pneumonia     Patient Active Problem List   Diagnosis Date Noted  . Community acquired pneumonia 05/30/2017  . Influenza A 05/30/2017  . BPH (benign prostatic hypertrophy) 05/30/2017  . COPD (chronic obstructive pulmonary disease) ([place])  05/30/2017  . Hypertension 05/30/2017  . GERD (gastroesophageal reflux disease) 05/30/2017  . Pulmonary nodules/lesions, multiple 09/14/2016  . Bowel habit changes 09/11/2015  . Malignant neoplasm of right upper lobe of lung ([place]) 10/27/2012  . Visit for wound check 10/09/2012  . Swollen arm 09/15/2012  . Encounter for adequacy testing for hemodialysis ([place]) 09/01/2012  . Aftercare following surgery of the circulatory system, [place] 08/18/2012  . Other complications due to renal dialysis device, implant, and graft 04/19/2012  . End stage renal disease ([place]) 07/14/2011  . Pre-operative cardiovascular examination 04/07/2011    Past Surgical History:  Procedure Laterality Date  . ANKLE FUSION     R ankle  . AV FISTULA PLACEMENT  06/01/2011   Procedure: ARTERIOVENOUS (AV) FISTULA CREATION;  Surgeon: Angelia Mould, MD;  Location: [facility] OR;  Service: Vascular;  Laterality: Left;  Creation of Left Arteriovenous fistula  . AV FISTULA PLACEMENT Left 07/12/2012   Procedure: ARTERIOVENOUS (AV) FISTULA CREATION;  Surgeon: Conrad Cuba, MD;  Location: [employer] [facility];  Service: Vascular;  Laterality: Left;  . AV FISTULA PLACEMENT Left 4.11.14  . BASCILIC VEIN TRANSPOSITION Left 08/28/2012   Procedure: BASCILIC VEIN TRANSPOSITION;  Surgeon: Conrad , MD;  Location: [place];  Service: Vascular;  Laterality: Left;  left 2nd stage basilic vein transposition  . CHOLECYSTECTOMY    . COLONOSCOPY  2005   Dr. Laural Golden, diverticulosis, hemorrhoids, 2 small polyps  . FISTULOGRAM N/A 08/16/2011   Procedure: FISTULOGRAM;  Surgeon: Angelia Mould, MD;  Location: [facility] CATH LAB;  Service: Cardiovascular;  Laterality: N/A;  . fistulogram left radial cephalic AV fistula  [phone]     Dr. Scot Dock  . HIP ARTHROPLASTY Right    "partial"  . JOINT REPLACEMENT     L knee- 2010- [facility], L hip partial replacement   . LUNG REMOVAL, PARTIAL  2003   partial, right  . NEPHRECTOMY  2001   left  . SHUNTOGRAM N/A 05/15/2012    Procedure: Earney Mallet;  Surgeon: Conrad Kaibab, MD;  Location: [facility] CATH LAB;  Service: Cardiovascular;  Laterality: N/A;  . SHUNTOGRAM N/A 05/09/2014   Procedure: FISTULOGRAM;  Surgeon: Conrad , MD;  Location: [facility] CATH LAB;  Service: Cardiovascular;  Laterality: N/A;        Home Medications    Prior to Admission medications   Medication Sig Start Date End Date Taking? Authorizing Provider  clobetasol cream (TEMOVATE) 2.09 % Apply 1 application topically 2 (two) times daily as needed (itchy ears).    [provider]  lidocaine-prilocaine (EMLA) cream Apply 1 application topically as directed. Apply to access site for dialysis 1 hour prior to visit 12/17/14   [provider]  metoprolol tartrate (LOPRESSOR) 25 MG tablet Take 1 tablet (25 mg total) by mouth 2 (two) times daily. 12/27/17   Arnoldo Lenis, MD  omeprazole (PRILOSEC) 20 MG capsule Take 20 mg by mouth daily.    [provider]  sevelamer carbonate (RENVELA) 800 MG tablet Take 800-1,600 mg by mouth as directed. Take 2 tablets (1600mg ) three times a day with meals and 1 tablet (800mg ) with a snack    [provider]    Family History Family History  Problem Relation Age of Onset  . Heart disease Mother   . Heart disease Father   . Heart attack Father   . Anesthesia problems Neg Hx   . Hypotension Neg Hx   . Malignant hyperthermia Neg Hx   . Pseudochol deficiency Neg Hx   . Colon cancer Neg Hx     Social History Social History   Tobacco Use  . Smoking status: Former Smoker    Years: 35.00    Types: Cigarettes    Last attempt to quit: 02/04/1977    Years since quitting: 41.6  . Smokeless tobacco: Current User    Types: Chew  . Tobacco comment: Quit in 1978  Substance Use Topics  . Alcohol use: No    Alcohol/week: 0.0 standard drinks  . Drug use: No     Allergies   Patient has no known allergies.   Review of Systems Review of Systems  Constitutional: Negative for  fever.  Respiratory: Positive for shortness of breath (chronic, not worse). Negative for cough.   Cardiovascular: Negative for chest pain.  Gastrointestinal: Negative for abdominal pain.  Musculoskeletal: Positive for arthralgias and myalgias. Negative for back pain and neck pain.  Neurological: Negative for dizziness, syncope, weakness and headaches.  All other systems reviewed and are negative.    Physical Exam Updated Vital Signs BP (!) 182/66 (BP Location: Right Arm)   Pulse 68   Temp 98 F (36.7 C) (Oral)   Resp 20   Ht 5\' 7"  (1.702 m)   Wt 79.4 kg  SpO2 99%   BMI 27.41 kg/m   Physical Exam Vitals signs and nursing note reviewed.  Constitutional:      General: He is not in acute distress.    Appearance: Normal appearance. He is well-developed. He is not ill-appearing.     Comments: Calm and cooperative  HENT:     Head: Normocephalic and atraumatic.  Eyes:     General: No scleral icterus.       Right eye: No discharge.        Left eye: No discharge.     Conjunctiva/sclera: Conjunctivae normal.     Pupils: Pupils are equal, round, and reactive to light.  Neck:     Musculoskeletal: Normal range of motion.  Cardiovascular:     Rate and Rhythm: Normal rate and regular rhythm.  Pulmonary:     Effort: Pulmonary effort is normal. No respiratory distress.     Breath sounds: Normal breath sounds.  Abdominal:     General: There is no distension.     Palpations: Abdomen is soft.     Tenderness: There is no abdominal tenderness.  Musculoskeletal:     Comments: Upper extremities: No obvious swelling, deformity, or warmth. No tenderness. FROM of bilateral shoulders, elbows, wrist with strong grip strength. N/V intact.  Right hip: No obvious swelling, deformity, or warmth. No tenderness. Pain is elicited with ROM of hip. N/V intact.  Left hip: No obvious injury. Normal ROM    Skin:    General: Skin is warm and dry.  Neurological:     Mental Status: He is alert and  oriented to person, place, and time.  Psychiatric:        Behavior: Behavior normal.      ED Treatments / Results  Labs (all labs ordered are listed, but only abnormal results are displayed) Labs Reviewed - No data to display  EKG None  Radiology Dg Hip Unilat W Or Wo Pelvis 2-3 Views Right  Result Date: 10/01/2018 CLINICAL DATA:  Fall, right hip pain EXAM: DG HIP (WITH OR WITHOUT PELVIS) 2-3V RIGHT COMPARISON:  06/11/2015 FINDINGS: Status post right hip total arthroplasty. No evidence of perihardware fracture or dislocation and no change in component alignment. Osteopenia. No obvious fracture or dislocation of the pelvis or left hip, included in single AP view. Please note that plain radiographs are substantially insensitive for hip and pelvic fracture, particularly in the setting of osteopenia. Recommend CT or MRI to more sensitively evaluate for fracture if suspected. IMPRESSION: Status post right hip total arthroplasty. No evidence of perihardware fracture or dislocation and no change in component alignment. Osteopenia. No obvious fracture or dislocation of the pelvis or left hip, included in single AP view. Please note that plain radiographs are substantially insensitive for hip and pelvic fracture, particularly in the setting of osteopenia. Recommend CT or MRI to more sensitively evaluate for fracture if suspected. Electronically Signed   By: Eddie Candle M.D.   On: 10/01/2018 10:36    Procedures Procedures (including critical care time)  Medications Ordered in ED Medications  acetaminophen (TYLENOL) tablet 650 mg (has no administration in time range)     Initial Impression / Assessment and Plan / ED Course  I have reviewed the triage vital signs and the nursing notes.  Pertinent labs & imaging results that were available during my care of the patient were reviewed by me and considered in my medical decision making (see chart for details).  85 year old male presents after a  mechanical  fall since yesterday and some right hip pain. He is hypertensive but otherwise vitals are normal. No obvious sign of injury on exam and he is able to ambulate although he states it hurts. He does not have any evidence of other injuries. Will obtain Xray of the R hip and give Tylenol.  Xray is negative for acute fracture or dislocation. There is osteopenia and radiologist comments that if there is significant concern for a fracture that CT is more sensitive. Since pt has been ambulating and there is no tenderness of the hip/pelvis I think this is less likely. Shared visit with Dr. Rogene Houston. Will d/c with ortho f/u.  Final Clinical Impressions(s) / ED Diagnoses   Final diagnoses:  Fall, initial encounter  Right hip pain    ED Discharge Orders    None       Recardo Evangelist, PA-C 10/01/18 1121    Fredia Sorrow, MD 10/01/18 [phone]

## 2018-10-01 NOTE — ED Provider Notes (Signed)
  Medical screening examination/treatment/procedure(s) were conducted as a shared visit with non-physician practitioner(s) and myself.  I personally evaluated the patient during the encounter.  None   Patient here with a complaint of right anterior hip pain.  He had a fall stepping over a log yesterday.  Resulting in an injury to the right hip.  Patient had hip replacement done approximately 12 years ago by Dr. Lorre Nick.  Patient does not have any pain unless he is weightbearing.  He is concerned about an injury.  X-ray showed no evidence of any bony injury or dislocation.  Hip prosthesis is in place.  No evidence of any fracture below the prosthesis or at the acetabular area.  Will have patient follow-up with orthopedics.   Fredia Sorrow, MD 10/01/18 [phone]

## 2018-10-01 NOTE — ED Triage Notes (Signed)
Fell yesterday stepping over object.  Denies dizziness.  Injury to right hip.  Denies pain to right hip unless with weigh bearing, rating pain 6/10.  Denies any other discomfort.

## 2018-10-02 DIAGNOSIS — Z992 Dependence on renal dialysis: Secondary | ICD-10-CM | POA: Diagnosis not present

## 2018-10-02 DIAGNOSIS — D509 Iron deficiency anemia, unspecified: Secondary | ICD-10-CM | POA: Diagnosis not present

## 2018-10-02 DIAGNOSIS — D631 Anemia in chronic kidney disease: Secondary | ICD-10-CM | POA: Diagnosis not present

## 2018-10-02 DIAGNOSIS — N2581 Secondary hyperparathyroidism of renal origin: Secondary | ICD-10-CM | POA: Diagnosis not present

## 2018-10-02 DIAGNOSIS — N186 End stage renal disease: Secondary | ICD-10-CM | POA: Diagnosis not present

## 2018-10-03 ENCOUNTER — Ambulatory Visit (HOSPITAL_COMMUNITY)
Admission: RE | Admit: 2018-10-03 | Discharge: 2018-10-03 | Disposition: A | Discharge status: Home / Self Care | Payer: Medicare Other | Source: Ambulatory Visit | Attending: Radiation Oncology | Admitting: Radiation Oncology

## 2018-10-03 ENCOUNTER — Other Ambulatory Visit: Payer: Self-pay

## 2018-10-03 DIAGNOSIS — C3411 Malignant neoplasm of upper lobe, right bronchus or lung: Secondary | ICD-10-CM | POA: Diagnosis not present

## 2018-10-03 DIAGNOSIS — I700 Atherosclerosis of aorta: Secondary | ICD-10-CM | POA: Diagnosis not present

## 2018-10-03 DIAGNOSIS — J439 Emphysema, unspecified: Secondary | ICD-10-CM | POA: Diagnosis not present

## 2018-10-04 ENCOUNTER — Telehealth: Payer: Self-pay | Admitting: Radiation Oncology

## 2018-10-04 DIAGNOSIS — D509 Iron deficiency anemia, unspecified: Secondary | ICD-10-CM | POA: Diagnosis not present

## 2018-10-04 DIAGNOSIS — C3411 Malignant neoplasm of upper lobe, right bronchus or lung: Secondary | ICD-10-CM

## 2018-10-04 DIAGNOSIS — N2581 Secondary hyperparathyroidism of renal origin: Secondary | ICD-10-CM | POA: Diagnosis not present

## 2018-10-04 DIAGNOSIS — Z992 Dependence on renal dialysis: Secondary | ICD-10-CM | POA: Diagnosis not present

## 2018-10-04 DIAGNOSIS — D631 Anemia in chronic kidney disease: Secondary | ICD-10-CM | POA: Diagnosis not present

## 2018-10-04 DIAGNOSIS — N186 End stage renal disease: Secondary | ICD-10-CM | POA: Diagnosis not present

## 2018-10-04 NOTE — Telephone Encounter (Signed)
I called Ms. Delorse Lek, the patient's niece. She will relay the information to Mr. Lipkin. His recent CT of the chest showed improvement in the recently treated site, with expected post XRT inflammatory changes. He's doing well at home per report without symptoms of fevers, cough, or increased sob. I let her know that there were other nodules that have been followed that have grown in the interval since his February PET scan and that after review, Dr. Lisbeth Renshaw recommends repeat scan in 3 months time. He will continue to see Dr. Tressie Stalker as well at [facility]. We will powershare his dicom images so Dr. Tressie Stalker can see his films as well. I've ordered a noncontrast CT of the chest at [facility] in 3 months.

## 2018-10-06 DIAGNOSIS — D509 Iron deficiency anemia, unspecified: Secondary | ICD-10-CM | POA: Diagnosis not present

## 2018-10-06 DIAGNOSIS — D631 Anemia in chronic kidney disease: Secondary | ICD-10-CM | POA: Diagnosis not present

## 2018-10-06 DIAGNOSIS — N2581 Secondary hyperparathyroidism of renal origin: Secondary | ICD-10-CM | POA: Diagnosis not present

## 2018-10-06 DIAGNOSIS — Z992 Dependence on renal dialysis: Secondary | ICD-10-CM | POA: Diagnosis not present

## 2018-10-06 DIAGNOSIS — N186 End stage renal disease: Secondary | ICD-10-CM | POA: Diagnosis not present

## 2018-10-09 DIAGNOSIS — Z992 Dependence on renal dialysis: Secondary | ICD-10-CM | POA: Diagnosis not present

## 2018-10-09 DIAGNOSIS — N2581 Secondary hyperparathyroidism of renal origin: Secondary | ICD-10-CM | POA: Diagnosis not present

## 2018-10-09 DIAGNOSIS — D509 Iron deficiency anemia, unspecified: Secondary | ICD-10-CM | POA: Diagnosis not present

## 2018-10-09 DIAGNOSIS — N186 End stage renal disease: Secondary | ICD-10-CM | POA: Diagnosis not present

## 2018-10-09 DIAGNOSIS — D631 Anemia in chronic kidney disease: Secondary | ICD-10-CM | POA: Diagnosis not present

## 2018-10-10 DIAGNOSIS — C3411 Malignant neoplasm of upper lobe, right bronchus or lung: Secondary | ICD-10-CM | POA: Diagnosis not present

## 2018-10-10 DIAGNOSIS — Z85118 Personal history of other malignant neoplasm of bronchus and lung: Secondary | ICD-10-CM | POA: Diagnosis not present

## 2018-10-10 DIAGNOSIS — Z85528 Personal history of other malignant neoplasm of kidney: Secondary | ICD-10-CM | POA: Diagnosis not present

## 2018-10-11 DIAGNOSIS — Z992 Dependence on renal dialysis: Secondary | ICD-10-CM | POA: Diagnosis not present

## 2018-10-11 DIAGNOSIS — D509 Iron deficiency anemia, unspecified: Secondary | ICD-10-CM | POA: Diagnosis not present

## 2018-10-11 DIAGNOSIS — D631 Anemia in chronic kidney disease: Secondary | ICD-10-CM | POA: Diagnosis not present

## 2018-10-11 DIAGNOSIS — N2581 Secondary hyperparathyroidism of renal origin: Secondary | ICD-10-CM | POA: Diagnosis not present

## 2018-10-11 DIAGNOSIS — N186 End stage renal disease: Secondary | ICD-10-CM | POA: Diagnosis not present

## 2018-10-13 DIAGNOSIS — N186 End stage renal disease: Secondary | ICD-10-CM | POA: Diagnosis not present

## 2018-10-13 DIAGNOSIS — D631 Anemia in chronic kidney disease: Secondary | ICD-10-CM | POA: Diagnosis not present

## 2018-10-13 DIAGNOSIS — N2581 Secondary hyperparathyroidism of renal origin: Secondary | ICD-10-CM | POA: Diagnosis not present

## 2018-10-13 DIAGNOSIS — D509 Iron deficiency anemia, unspecified: Secondary | ICD-10-CM | POA: Diagnosis not present

## 2018-10-13 DIAGNOSIS — Z992 Dependence on renal dialysis: Secondary | ICD-10-CM | POA: Diagnosis not present

## 2018-10-16 DIAGNOSIS — Z992 Dependence on renal dialysis: Secondary | ICD-10-CM | POA: Diagnosis not present

## 2018-10-16 DIAGNOSIS — D509 Iron deficiency anemia, unspecified: Secondary | ICD-10-CM | POA: Diagnosis not present

## 2018-10-16 DIAGNOSIS — N186 End stage renal disease: Secondary | ICD-10-CM | POA: Diagnosis not present

## 2018-10-16 DIAGNOSIS — D631 Anemia in chronic kidney disease: Secondary | ICD-10-CM | POA: Diagnosis not present

## 2018-10-16 DIAGNOSIS — N2581 Secondary hyperparathyroidism of renal origin: Secondary | ICD-10-CM | POA: Diagnosis not present

## 2018-10-18 DIAGNOSIS — N186 End stage renal disease: Secondary | ICD-10-CM | POA: Diagnosis not present

## 2018-10-18 DIAGNOSIS — N2581 Secondary hyperparathyroidism of renal origin: Secondary | ICD-10-CM | POA: Diagnosis not present

## 2018-10-18 DIAGNOSIS — D509 Iron deficiency anemia, unspecified: Secondary | ICD-10-CM | POA: Diagnosis not present

## 2018-10-18 DIAGNOSIS — D631 Anemia in chronic kidney disease: Secondary | ICD-10-CM | POA: Diagnosis not present

## 2018-10-18 DIAGNOSIS — Z992 Dependence on renal dialysis: Secondary | ICD-10-CM | POA: Diagnosis not present

## 2018-10-20 DIAGNOSIS — N186 End stage renal disease: Secondary | ICD-10-CM | POA: Diagnosis not present

## 2018-10-20 DIAGNOSIS — D631 Anemia in chronic kidney disease: Secondary | ICD-10-CM | POA: Diagnosis not present

## 2018-10-20 DIAGNOSIS — D509 Iron deficiency anemia, unspecified: Secondary | ICD-10-CM | POA: Diagnosis not present

## 2018-10-20 DIAGNOSIS — Z992 Dependence on renal dialysis: Secondary | ICD-10-CM | POA: Diagnosis not present

## 2018-10-20 DIAGNOSIS — N2581 Secondary hyperparathyroidism of renal origin: Secondary | ICD-10-CM | POA: Diagnosis not present

## 2018-10-23 DIAGNOSIS — N2581 Secondary hyperparathyroidism of renal origin: Secondary | ICD-10-CM | POA: Diagnosis not present

## 2018-10-23 DIAGNOSIS — N186 End stage renal disease: Secondary | ICD-10-CM | POA: Diagnosis not present

## 2018-10-23 DIAGNOSIS — D631 Anemia in chronic kidney disease: Secondary | ICD-10-CM | POA: Diagnosis not present

## 2018-10-23 DIAGNOSIS — Z992 Dependence on renal dialysis: Secondary | ICD-10-CM | POA: Diagnosis not present

## 2018-10-23 DIAGNOSIS — D509 Iron deficiency anemia, unspecified: Secondary | ICD-10-CM | POA: Diagnosis not present

## 2018-10-25 DIAGNOSIS — N186 End stage renal disease: Secondary | ICD-10-CM | POA: Diagnosis not present

## 2018-10-25 DIAGNOSIS — Z992 Dependence on renal dialysis: Secondary | ICD-10-CM | POA: Diagnosis not present

## 2018-10-25 DIAGNOSIS — N2581 Secondary hyperparathyroidism of renal origin: Secondary | ICD-10-CM | POA: Diagnosis not present

## 2018-10-25 DIAGNOSIS — D509 Iron deficiency anemia, unspecified: Secondary | ICD-10-CM | POA: Diagnosis not present

## 2018-10-25 DIAGNOSIS — D631 Anemia in chronic kidney disease: Secondary | ICD-10-CM | POA: Diagnosis not present

## 2018-10-27 DIAGNOSIS — D631 Anemia in chronic kidney disease: Secondary | ICD-10-CM | POA: Diagnosis not present

## 2018-10-27 DIAGNOSIS — Z992 Dependence on renal dialysis: Secondary | ICD-10-CM | POA: Diagnosis not present

## 2018-10-27 DIAGNOSIS — N186 End stage renal disease: Secondary | ICD-10-CM | POA: Diagnosis not present

## 2018-10-27 DIAGNOSIS — N2581 Secondary hyperparathyroidism of renal origin: Secondary | ICD-10-CM | POA: Diagnosis not present

## 2018-10-27 DIAGNOSIS — D509 Iron deficiency anemia, unspecified: Secondary | ICD-10-CM | POA: Diagnosis not present

## 2018-10-30 DIAGNOSIS — N186 End stage renal disease: Secondary | ICD-10-CM | POA: Diagnosis not present

## 2018-10-30 DIAGNOSIS — D631 Anemia in chronic kidney disease: Secondary | ICD-10-CM | POA: Diagnosis not present

## 2018-10-30 DIAGNOSIS — Z992 Dependence on renal dialysis: Secondary | ICD-10-CM | POA: Diagnosis not present

## 2018-10-30 DIAGNOSIS — D509 Iron deficiency anemia, unspecified: Secondary | ICD-10-CM | POA: Diagnosis not present

## 2018-10-30 DIAGNOSIS — N2581 Secondary hyperparathyroidism of renal origin: Secondary | ICD-10-CM | POA: Diagnosis not present

## 2018-11-01 DIAGNOSIS — D631 Anemia in chronic kidney disease: Secondary | ICD-10-CM | POA: Diagnosis not present

## 2018-11-01 DIAGNOSIS — N2581 Secondary hyperparathyroidism of renal origin: Secondary | ICD-10-CM | POA: Diagnosis not present

## 2018-11-01 DIAGNOSIS — Z992 Dependence on renal dialysis: Secondary | ICD-10-CM | POA: Diagnosis not present

## 2018-11-01 DIAGNOSIS — N186 End stage renal disease: Secondary | ICD-10-CM | POA: Diagnosis not present

## 2018-11-01 DIAGNOSIS — D509 Iron deficiency anemia, unspecified: Secondary | ICD-10-CM | POA: Diagnosis not present

## 2018-11-03 DIAGNOSIS — D509 Iron deficiency anemia, unspecified: Secondary | ICD-10-CM | POA: Diagnosis not present

## 2018-11-03 DIAGNOSIS — N186 End stage renal disease: Secondary | ICD-10-CM | POA: Diagnosis not present

## 2018-11-03 DIAGNOSIS — N2581 Secondary hyperparathyroidism of renal origin: Secondary | ICD-10-CM | POA: Diagnosis not present

## 2018-11-03 DIAGNOSIS — D631 Anemia in chronic kidney disease: Secondary | ICD-10-CM | POA: Diagnosis not present

## 2018-11-03 DIAGNOSIS — Z992 Dependence on renal dialysis: Secondary | ICD-10-CM | POA: Diagnosis not present

## 2018-11-06 DIAGNOSIS — D631 Anemia in chronic kidney disease: Secondary | ICD-10-CM | POA: Diagnosis not present

## 2018-11-06 DIAGNOSIS — D509 Iron deficiency anemia, unspecified: Secondary | ICD-10-CM | POA: Diagnosis not present

## 2018-11-06 DIAGNOSIS — N2581 Secondary hyperparathyroidism of renal origin: Secondary | ICD-10-CM | POA: Diagnosis not present

## 2018-11-06 DIAGNOSIS — Z992 Dependence on renal dialysis: Secondary | ICD-10-CM | POA: Diagnosis not present

## 2018-11-06 DIAGNOSIS — N186 End stage renal disease: Secondary | ICD-10-CM | POA: Diagnosis not present

## 2018-11-08 DIAGNOSIS — D509 Iron deficiency anemia, unspecified: Secondary | ICD-10-CM | POA: Diagnosis not present

## 2018-11-08 DIAGNOSIS — D631 Anemia in chronic kidney disease: Secondary | ICD-10-CM | POA: Diagnosis not present

## 2018-11-08 DIAGNOSIS — N186 End stage renal disease: Secondary | ICD-10-CM | POA: Diagnosis not present

## 2018-11-08 DIAGNOSIS — N2581 Secondary hyperparathyroidism of renal origin: Secondary | ICD-10-CM | POA: Diagnosis not present

## 2018-11-08 DIAGNOSIS — Z992 Dependence on renal dialysis: Secondary | ICD-10-CM | POA: Diagnosis not present

## 2018-11-10 DIAGNOSIS — N2581 Secondary hyperparathyroidism of renal origin: Secondary | ICD-10-CM | POA: Diagnosis not present

## 2018-11-10 DIAGNOSIS — Z992 Dependence on renal dialysis: Secondary | ICD-10-CM | POA: Diagnosis not present

## 2018-11-10 DIAGNOSIS — D631 Anemia in chronic kidney disease: Secondary | ICD-10-CM | POA: Diagnosis not present

## 2018-11-10 DIAGNOSIS — N186 End stage renal disease: Secondary | ICD-10-CM | POA: Diagnosis not present

## 2018-11-10 DIAGNOSIS — D509 Iron deficiency anemia, unspecified: Secondary | ICD-10-CM | POA: Diagnosis not present

## 2018-11-13 DIAGNOSIS — Z992 Dependence on renal dialysis: Secondary | ICD-10-CM | POA: Diagnosis not present

## 2018-11-13 DIAGNOSIS — D509 Iron deficiency anemia, unspecified: Secondary | ICD-10-CM | POA: Diagnosis not present

## 2018-11-13 DIAGNOSIS — D631 Anemia in chronic kidney disease: Secondary | ICD-10-CM | POA: Diagnosis not present

## 2018-11-13 DIAGNOSIS — N2581 Secondary hyperparathyroidism of renal origin: Secondary | ICD-10-CM | POA: Diagnosis not present

## 2018-11-13 DIAGNOSIS — N186 End stage renal disease: Secondary | ICD-10-CM | POA: Diagnosis not present

## 2018-11-15 DIAGNOSIS — Z992 Dependence on renal dialysis: Secondary | ICD-10-CM | POA: Diagnosis not present

## 2018-11-15 DIAGNOSIS — D631 Anemia in chronic kidney disease: Secondary | ICD-10-CM | POA: Diagnosis not present

## 2018-11-15 DIAGNOSIS — N2581 Secondary hyperparathyroidism of renal origin: Secondary | ICD-10-CM | POA: Diagnosis not present

## 2018-11-15 DIAGNOSIS — N186 End stage renal disease: Secondary | ICD-10-CM | POA: Diagnosis not present

## 2018-11-15 DIAGNOSIS — D509 Iron deficiency anemia, unspecified: Secondary | ICD-10-CM | POA: Diagnosis not present

## 2018-11-16 DIAGNOSIS — Z992 Dependence on renal dialysis: Secondary | ICD-10-CM | POA: Diagnosis not present

## 2018-11-16 DIAGNOSIS — D509 Iron deficiency anemia, unspecified: Secondary | ICD-10-CM | POA: Diagnosis not present

## 2018-11-16 DIAGNOSIS — D631 Anemia in chronic kidney disease: Secondary | ICD-10-CM | POA: Diagnosis not present

## 2018-11-16 DIAGNOSIS — N186 End stage renal disease: Secondary | ICD-10-CM | POA: Diagnosis not present

## 2018-11-16 DIAGNOSIS — N2581 Secondary hyperparathyroidism of renal origin: Secondary | ICD-10-CM | POA: Diagnosis not present

## 2018-11-17 DIAGNOSIS — N2581 Secondary hyperparathyroidism of renal origin: Secondary | ICD-10-CM | POA: Diagnosis not present

## 2018-11-17 DIAGNOSIS — D631 Anemia in chronic kidney disease: Secondary | ICD-10-CM | POA: Diagnosis not present

## 2018-11-17 DIAGNOSIS — Z992 Dependence on renal dialysis: Secondary | ICD-10-CM | POA: Diagnosis not present

## 2018-11-17 DIAGNOSIS — D509 Iron deficiency anemia, unspecified: Secondary | ICD-10-CM | POA: Diagnosis not present

## 2018-11-17 DIAGNOSIS — N186 End stage renal disease: Secondary | ICD-10-CM | POA: Diagnosis not present

## 2018-11-20 DIAGNOSIS — N2581 Secondary hyperparathyroidism of renal origin: Secondary | ICD-10-CM | POA: Diagnosis not present

## 2018-11-20 DIAGNOSIS — Z992 Dependence on renal dialysis: Secondary | ICD-10-CM | POA: Diagnosis not present

## 2018-11-20 DIAGNOSIS — D509 Iron deficiency anemia, unspecified: Secondary | ICD-10-CM | POA: Diagnosis not present

## 2018-11-20 DIAGNOSIS — N186 End stage renal disease: Secondary | ICD-10-CM | POA: Diagnosis not present

## 2018-11-20 DIAGNOSIS — D631 Anemia in chronic kidney disease: Secondary | ICD-10-CM | POA: Diagnosis not present

## 2018-11-22 DIAGNOSIS — D509 Iron deficiency anemia, unspecified: Secondary | ICD-10-CM | POA: Diagnosis not present

## 2018-11-22 DIAGNOSIS — N186 End stage renal disease: Secondary | ICD-10-CM | POA: Diagnosis not present

## 2018-11-22 DIAGNOSIS — D631 Anemia in chronic kidney disease: Secondary | ICD-10-CM | POA: Diagnosis not present

## 2018-11-22 DIAGNOSIS — N2581 Secondary hyperparathyroidism of renal origin: Secondary | ICD-10-CM | POA: Diagnosis not present

## 2018-11-22 DIAGNOSIS — Z992 Dependence on renal dialysis: Secondary | ICD-10-CM | POA: Diagnosis not present

## 2018-11-24 DIAGNOSIS — D509 Iron deficiency anemia, unspecified: Secondary | ICD-10-CM | POA: Diagnosis not present

## 2018-11-24 DIAGNOSIS — D631 Anemia in chronic kidney disease: Secondary | ICD-10-CM | POA: Diagnosis not present

## 2018-11-24 DIAGNOSIS — N2581 Secondary hyperparathyroidism of renal origin: Secondary | ICD-10-CM | POA: Diagnosis not present

## 2018-11-24 DIAGNOSIS — Z992 Dependence on renal dialysis: Secondary | ICD-10-CM | POA: Diagnosis not present

## 2018-11-24 DIAGNOSIS — N186 End stage renal disease: Secondary | ICD-10-CM | POA: Diagnosis not present

## 2018-11-27 DIAGNOSIS — N2581 Secondary hyperparathyroidism of renal origin: Secondary | ICD-10-CM | POA: Diagnosis not present

## 2018-11-27 DIAGNOSIS — Z992 Dependence on renal dialysis: Secondary | ICD-10-CM | POA: Diagnosis not present

## 2018-11-27 DIAGNOSIS — N186 End stage renal disease: Secondary | ICD-10-CM | POA: Diagnosis not present

## 2018-11-27 DIAGNOSIS — D631 Anemia in chronic kidney disease: Secondary | ICD-10-CM | POA: Diagnosis not present

## 2018-11-27 DIAGNOSIS — D509 Iron deficiency anemia, unspecified: Secondary | ICD-10-CM | POA: Diagnosis not present

## 2018-11-29 DIAGNOSIS — D509 Iron deficiency anemia, unspecified: Secondary | ICD-10-CM | POA: Diagnosis not present

## 2018-11-29 DIAGNOSIS — D631 Anemia in chronic kidney disease: Secondary | ICD-10-CM | POA: Diagnosis not present

## 2018-11-29 DIAGNOSIS — N2581 Secondary hyperparathyroidism of renal origin: Secondary | ICD-10-CM | POA: Diagnosis not present

## 2018-11-29 DIAGNOSIS — Z992 Dependence on renal dialysis: Secondary | ICD-10-CM | POA: Diagnosis not present

## 2018-11-29 DIAGNOSIS — N186 End stage renal disease: Secondary | ICD-10-CM | POA: Diagnosis not present

## 2018-12-01 DIAGNOSIS — Z992 Dependence on renal dialysis: Secondary | ICD-10-CM | POA: Diagnosis not present

## 2018-12-01 DIAGNOSIS — N186 End stage renal disease: Secondary | ICD-10-CM | POA: Diagnosis not present

## 2018-12-01 DIAGNOSIS — D509 Iron deficiency anemia, unspecified: Secondary | ICD-10-CM | POA: Diagnosis not present

## 2018-12-01 DIAGNOSIS — N2581 Secondary hyperparathyroidism of renal origin: Secondary | ICD-10-CM | POA: Diagnosis not present

## 2018-12-01 DIAGNOSIS — D631 Anemia in chronic kidney disease: Secondary | ICD-10-CM | POA: Diagnosis not present

## 2018-12-04 DIAGNOSIS — Z992 Dependence on renal dialysis: Secondary | ICD-10-CM | POA: Diagnosis not present

## 2018-12-04 DIAGNOSIS — D631 Anemia in chronic kidney disease: Secondary | ICD-10-CM | POA: Diagnosis not present

## 2018-12-04 DIAGNOSIS — N2581 Secondary hyperparathyroidism of renal origin: Secondary | ICD-10-CM | POA: Diagnosis not present

## 2018-12-04 DIAGNOSIS — D509 Iron deficiency anemia, unspecified: Secondary | ICD-10-CM | POA: Diagnosis not present

## 2018-12-04 DIAGNOSIS — N186 End stage renal disease: Secondary | ICD-10-CM | POA: Diagnosis not present

## 2018-12-06 DIAGNOSIS — N2581 Secondary hyperparathyroidism of renal origin: Secondary | ICD-10-CM | POA: Diagnosis not present

## 2018-12-06 DIAGNOSIS — D509 Iron deficiency anemia, unspecified: Secondary | ICD-10-CM | POA: Diagnosis not present

## 2018-12-06 DIAGNOSIS — N186 End stage renal disease: Secondary | ICD-10-CM | POA: Diagnosis not present

## 2018-12-06 DIAGNOSIS — Z992 Dependence on renal dialysis: Secondary | ICD-10-CM | POA: Diagnosis not present

## 2018-12-06 DIAGNOSIS — D631 Anemia in chronic kidney disease: Secondary | ICD-10-CM | POA: Diagnosis not present

## 2018-12-08 DIAGNOSIS — N2581 Secondary hyperparathyroidism of renal origin: Secondary | ICD-10-CM | POA: Diagnosis not present

## 2018-12-08 DIAGNOSIS — Z992 Dependence on renal dialysis: Secondary | ICD-10-CM | POA: Diagnosis not present

## 2018-12-08 DIAGNOSIS — N186 End stage renal disease: Secondary | ICD-10-CM | POA: Diagnosis not present

## 2018-12-08 DIAGNOSIS — D631 Anemia in chronic kidney disease: Secondary | ICD-10-CM | POA: Diagnosis not present

## 2018-12-08 DIAGNOSIS — D509 Iron deficiency anemia, unspecified: Secondary | ICD-10-CM | POA: Diagnosis not present

## 2018-12-09 DIAGNOSIS — N186 End stage renal disease: Secondary | ICD-10-CM | POA: Diagnosis not present

## 2018-12-09 DIAGNOSIS — N2581 Secondary hyperparathyroidism of renal origin: Secondary | ICD-10-CM | POA: Diagnosis not present

## 2018-12-09 DIAGNOSIS — Z992 Dependence on renal dialysis: Secondary | ICD-10-CM | POA: Diagnosis not present

## 2018-12-09 DIAGNOSIS — D509 Iron deficiency anemia, unspecified: Secondary | ICD-10-CM | POA: Diagnosis not present

## 2018-12-11 DIAGNOSIS — Z992 Dependence on renal dialysis: Secondary | ICD-10-CM | POA: Diagnosis not present

## 2018-12-11 DIAGNOSIS — N186 End stage renal disease: Secondary | ICD-10-CM | POA: Diagnosis not present

## 2018-12-11 DIAGNOSIS — N2581 Secondary hyperparathyroidism of renal origin: Secondary | ICD-10-CM | POA: Diagnosis not present

## 2018-12-11 DIAGNOSIS — D509 Iron deficiency anemia, unspecified: Secondary | ICD-10-CM | POA: Diagnosis not present

## 2018-12-13 DIAGNOSIS — D509 Iron deficiency anemia, unspecified: Secondary | ICD-10-CM | POA: Diagnosis not present

## 2018-12-13 DIAGNOSIS — N2581 Secondary hyperparathyroidism of renal origin: Secondary | ICD-10-CM | POA: Diagnosis not present

## 2018-12-13 DIAGNOSIS — N186 End stage renal disease: Secondary | ICD-10-CM | POA: Diagnosis not present

## 2018-12-13 DIAGNOSIS — Z992 Dependence on renal dialysis: Secondary | ICD-10-CM | POA: Diagnosis not present

## 2018-12-15 DIAGNOSIS — Z992 Dependence on renal dialysis: Secondary | ICD-10-CM | POA: Diagnosis not present

## 2018-12-15 DIAGNOSIS — N2581 Secondary hyperparathyroidism of renal origin: Secondary | ICD-10-CM | POA: Diagnosis not present

## 2018-12-15 DIAGNOSIS — D509 Iron deficiency anemia, unspecified: Secondary | ICD-10-CM | POA: Diagnosis not present

## 2018-12-15 DIAGNOSIS — N186 End stage renal disease: Secondary | ICD-10-CM | POA: Diagnosis not present

## 2018-12-16 DIAGNOSIS — N186 End stage renal disease: Secondary | ICD-10-CM | POA: Diagnosis not present

## 2018-12-16 DIAGNOSIS — Z992 Dependence on renal dialysis: Secondary | ICD-10-CM | POA: Diagnosis not present

## 2018-12-16 DIAGNOSIS — N2581 Secondary hyperparathyroidism of renal origin: Secondary | ICD-10-CM | POA: Diagnosis not present

## 2018-12-16 DIAGNOSIS — D509 Iron deficiency anemia, unspecified: Secondary | ICD-10-CM | POA: Diagnosis not present

## 2018-12-18 DIAGNOSIS — N2581 Secondary hyperparathyroidism of renal origin: Secondary | ICD-10-CM | POA: Diagnosis not present

## 2018-12-18 DIAGNOSIS — N186 End stage renal disease: Secondary | ICD-10-CM | POA: Diagnosis not present

## 2018-12-18 DIAGNOSIS — Z992 Dependence on renal dialysis: Secondary | ICD-10-CM | POA: Diagnosis not present

## 2018-12-18 DIAGNOSIS — D509 Iron deficiency anemia, unspecified: Secondary | ICD-10-CM | POA: Diagnosis not present

## 2018-12-20 DIAGNOSIS — Z992 Dependence on renal dialysis: Secondary | ICD-10-CM | POA: Diagnosis not present

## 2018-12-20 DIAGNOSIS — N186 End stage renal disease: Secondary | ICD-10-CM | POA: Diagnosis not present

## 2018-12-20 DIAGNOSIS — N2581 Secondary hyperparathyroidism of renal origin: Secondary | ICD-10-CM | POA: Diagnosis not present

## 2018-12-20 DIAGNOSIS — D509 Iron deficiency anemia, unspecified: Secondary | ICD-10-CM | POA: Diagnosis not present

## 2018-12-22 DIAGNOSIS — N186 End stage renal disease: Secondary | ICD-10-CM | POA: Diagnosis not present

## 2018-12-22 DIAGNOSIS — D509 Iron deficiency anemia, unspecified: Secondary | ICD-10-CM | POA: Diagnosis not present

## 2018-12-22 DIAGNOSIS — Z992 Dependence on renal dialysis: Secondary | ICD-10-CM | POA: Diagnosis not present

## 2018-12-22 DIAGNOSIS — N2581 Secondary hyperparathyroidism of renal origin: Secondary | ICD-10-CM | POA: Diagnosis not present

## 2018-12-25 DIAGNOSIS — D509 Iron deficiency anemia, unspecified: Secondary | ICD-10-CM | POA: Diagnosis not present

## 2018-12-25 DIAGNOSIS — Z992 Dependence on renal dialysis: Secondary | ICD-10-CM | POA: Diagnosis not present

## 2018-12-25 DIAGNOSIS — N186 End stage renal disease: Secondary | ICD-10-CM | POA: Diagnosis not present

## 2018-12-25 DIAGNOSIS — N2581 Secondary hyperparathyroidism of renal origin: Secondary | ICD-10-CM | POA: Diagnosis not present

## 2018-12-27 DIAGNOSIS — Z992 Dependence on renal dialysis: Secondary | ICD-10-CM | POA: Diagnosis not present

## 2018-12-27 DIAGNOSIS — N186 End stage renal disease: Secondary | ICD-10-CM | POA: Diagnosis not present

## 2018-12-27 DIAGNOSIS — N2581 Secondary hyperparathyroidism of renal origin: Secondary | ICD-10-CM | POA: Diagnosis not present

## 2018-12-27 DIAGNOSIS — D509 Iron deficiency anemia, unspecified: Secondary | ICD-10-CM | POA: Diagnosis not present

## 2018-12-29 DIAGNOSIS — N186 End stage renal disease: Secondary | ICD-10-CM | POA: Diagnosis not present

## 2018-12-29 DIAGNOSIS — D509 Iron deficiency anemia, unspecified: Secondary | ICD-10-CM | POA: Diagnosis not present

## 2018-12-29 DIAGNOSIS — N2581 Secondary hyperparathyroidism of renal origin: Secondary | ICD-10-CM | POA: Diagnosis not present

## 2018-12-29 DIAGNOSIS — Z992 Dependence on renal dialysis: Secondary | ICD-10-CM | POA: Diagnosis not present

## 2019-01-01 DIAGNOSIS — N186 End stage renal disease: Secondary | ICD-10-CM | POA: Diagnosis not present

## 2019-01-01 DIAGNOSIS — Z992 Dependence on renal dialysis: Secondary | ICD-10-CM | POA: Diagnosis not present

## 2019-01-01 DIAGNOSIS — D509 Iron deficiency anemia, unspecified: Secondary | ICD-10-CM | POA: Diagnosis not present

## 2019-01-01 DIAGNOSIS — N2581 Secondary hyperparathyroidism of renal origin: Secondary | ICD-10-CM | POA: Diagnosis not present

## 2019-01-02 DIAGNOSIS — Z23 Encounter for immunization: Secondary | ICD-10-CM | POA: Diagnosis not present

## 2019-01-03 DIAGNOSIS — Z992 Dependence on renal dialysis: Secondary | ICD-10-CM | POA: Diagnosis not present

## 2019-01-03 DIAGNOSIS — N2581 Secondary hyperparathyroidism of renal origin: Secondary | ICD-10-CM | POA: Diagnosis not present

## 2019-01-03 DIAGNOSIS — N186 End stage renal disease: Secondary | ICD-10-CM | POA: Diagnosis not present

## 2019-01-03 DIAGNOSIS — D509 Iron deficiency anemia, unspecified: Secondary | ICD-10-CM | POA: Diagnosis not present

## 2019-01-05 DIAGNOSIS — N186 End stage renal disease: Secondary | ICD-10-CM | POA: Diagnosis not present

## 2019-01-05 DIAGNOSIS — N2581 Secondary hyperparathyroidism of renal origin: Secondary | ICD-10-CM | POA: Diagnosis not present

## 2019-01-05 DIAGNOSIS — Z992 Dependence on renal dialysis: Secondary | ICD-10-CM | POA: Diagnosis not present

## 2019-01-05 DIAGNOSIS — D509 Iron deficiency anemia, unspecified: Secondary | ICD-10-CM | POA: Diagnosis not present

## 2019-01-08 DIAGNOSIS — N2581 Secondary hyperparathyroidism of renal origin: Secondary | ICD-10-CM | POA: Diagnosis not present

## 2019-01-08 DIAGNOSIS — D509 Iron deficiency anemia, unspecified: Secondary | ICD-10-CM | POA: Diagnosis not present

## 2019-01-08 DIAGNOSIS — Z992 Dependence on renal dialysis: Secondary | ICD-10-CM | POA: Diagnosis not present

## 2019-01-08 DIAGNOSIS — N186 End stage renal disease: Secondary | ICD-10-CM | POA: Diagnosis not present

## 2019-01-09 DIAGNOSIS — N2581 Secondary hyperparathyroidism of renal origin: Secondary | ICD-10-CM | POA: Diagnosis not present

## 2019-01-09 DIAGNOSIS — D509 Iron deficiency anemia, unspecified: Secondary | ICD-10-CM | POA: Diagnosis not present

## 2019-01-09 DIAGNOSIS — Z992 Dependence on renal dialysis: Secondary | ICD-10-CM | POA: Diagnosis not present

## 2019-01-09 DIAGNOSIS — N186 End stage renal disease: Secondary | ICD-10-CM | POA: Diagnosis not present

## 2019-01-09 DIAGNOSIS — D631 Anemia in chronic kidney disease: Secondary | ICD-10-CM | POA: Diagnosis not present

## 2019-01-10 DIAGNOSIS — Z992 Dependence on renal dialysis: Secondary | ICD-10-CM | POA: Diagnosis not present

## 2019-01-10 DIAGNOSIS — D631 Anemia in chronic kidney disease: Secondary | ICD-10-CM | POA: Diagnosis not present

## 2019-01-10 DIAGNOSIS — N2581 Secondary hyperparathyroidism of renal origin: Secondary | ICD-10-CM | POA: Diagnosis not present

## 2019-01-10 DIAGNOSIS — D509 Iron deficiency anemia, unspecified: Secondary | ICD-10-CM | POA: Diagnosis not present

## 2019-01-10 DIAGNOSIS — N186 End stage renal disease: Secondary | ICD-10-CM | POA: Diagnosis not present

## 2019-01-12 DIAGNOSIS — D509 Iron deficiency anemia, unspecified: Secondary | ICD-10-CM | POA: Diagnosis not present

## 2019-01-12 DIAGNOSIS — Z992 Dependence on renal dialysis: Secondary | ICD-10-CM | POA: Diagnosis not present

## 2019-01-12 DIAGNOSIS — N2581 Secondary hyperparathyroidism of renal origin: Secondary | ICD-10-CM | POA: Diagnosis not present

## 2019-01-12 DIAGNOSIS — D631 Anemia in chronic kidney disease: Secondary | ICD-10-CM | POA: Diagnosis not present

## 2019-01-12 DIAGNOSIS — N186 End stage renal disease: Secondary | ICD-10-CM | POA: Diagnosis not present

## 2019-01-15 DIAGNOSIS — D509 Iron deficiency anemia, unspecified: Secondary | ICD-10-CM | POA: Diagnosis not present

## 2019-01-15 DIAGNOSIS — D631 Anemia in chronic kidney disease: Secondary | ICD-10-CM | POA: Diagnosis not present

## 2019-01-15 DIAGNOSIS — N186 End stage renal disease: Secondary | ICD-10-CM | POA: Diagnosis not present

## 2019-01-15 DIAGNOSIS — Z992 Dependence on renal dialysis: Secondary | ICD-10-CM | POA: Diagnosis not present

## 2019-01-15 DIAGNOSIS — N2581 Secondary hyperparathyroidism of renal origin: Secondary | ICD-10-CM | POA: Diagnosis not present

## 2019-01-17 DIAGNOSIS — N186 End stage renal disease: Secondary | ICD-10-CM | POA: Diagnosis not present

## 2019-01-17 DIAGNOSIS — N2581 Secondary hyperparathyroidism of renal origin: Secondary | ICD-10-CM | POA: Diagnosis not present

## 2019-01-17 DIAGNOSIS — D631 Anemia in chronic kidney disease: Secondary | ICD-10-CM | POA: Diagnosis not present

## 2019-01-17 DIAGNOSIS — D509 Iron deficiency anemia, unspecified: Secondary | ICD-10-CM | POA: Diagnosis not present

## 2019-01-17 DIAGNOSIS — Z992 Dependence on renal dialysis: Secondary | ICD-10-CM | POA: Diagnosis not present

## 2019-01-19 DIAGNOSIS — Z992 Dependence on renal dialysis: Secondary | ICD-10-CM | POA: Diagnosis not present

## 2019-01-19 DIAGNOSIS — N2581 Secondary hyperparathyroidism of renal origin: Secondary | ICD-10-CM | POA: Diagnosis not present

## 2019-01-19 DIAGNOSIS — N186 End stage renal disease: Secondary | ICD-10-CM | POA: Diagnosis not present

## 2019-01-19 DIAGNOSIS — D631 Anemia in chronic kidney disease: Secondary | ICD-10-CM | POA: Diagnosis not present

## 2019-01-19 DIAGNOSIS — D509 Iron deficiency anemia, unspecified: Secondary | ICD-10-CM | POA: Diagnosis not present

## 2019-01-22 DIAGNOSIS — N186 End stage renal disease: Secondary | ICD-10-CM | POA: Diagnosis not present

## 2019-01-22 DIAGNOSIS — N2581 Secondary hyperparathyroidism of renal origin: Secondary | ICD-10-CM | POA: Diagnosis not present

## 2019-01-22 DIAGNOSIS — D631 Anemia in chronic kidney disease: Secondary | ICD-10-CM | POA: Diagnosis not present

## 2019-01-22 DIAGNOSIS — Z992 Dependence on renal dialysis: Secondary | ICD-10-CM | POA: Diagnosis not present

## 2019-01-22 DIAGNOSIS — D509 Iron deficiency anemia, unspecified: Secondary | ICD-10-CM | POA: Diagnosis not present

## 2019-01-24 DIAGNOSIS — D509 Iron deficiency anemia, unspecified: Secondary | ICD-10-CM | POA: Diagnosis not present

## 2019-01-24 DIAGNOSIS — N2581 Secondary hyperparathyroidism of renal origin: Secondary | ICD-10-CM | POA: Diagnosis not present

## 2019-01-24 DIAGNOSIS — Z992 Dependence on renal dialysis: Secondary | ICD-10-CM | POA: Diagnosis not present

## 2019-01-24 DIAGNOSIS — N186 End stage renal disease: Secondary | ICD-10-CM | POA: Diagnosis not present

## 2019-01-24 DIAGNOSIS — D631 Anemia in chronic kidney disease: Secondary | ICD-10-CM | POA: Diagnosis not present

## 2019-01-26 DIAGNOSIS — D631 Anemia in chronic kidney disease: Secondary | ICD-10-CM | POA: Diagnosis not present

## 2019-01-26 DIAGNOSIS — Z992 Dependence on renal dialysis: Secondary | ICD-10-CM | POA: Diagnosis not present

## 2019-01-26 DIAGNOSIS — N186 End stage renal disease: Secondary | ICD-10-CM | POA: Diagnosis not present

## 2019-01-26 DIAGNOSIS — N2581 Secondary hyperparathyroidism of renal origin: Secondary | ICD-10-CM | POA: Diagnosis not present

## 2019-01-26 DIAGNOSIS — D509 Iron deficiency anemia, unspecified: Secondary | ICD-10-CM | POA: Diagnosis not present

## 2019-01-29 DIAGNOSIS — D509 Iron deficiency anemia, unspecified: Secondary | ICD-10-CM | POA: Diagnosis not present

## 2019-01-29 DIAGNOSIS — D631 Anemia in chronic kidney disease: Secondary | ICD-10-CM | POA: Diagnosis not present

## 2019-01-29 DIAGNOSIS — N2581 Secondary hyperparathyroidism of renal origin: Secondary | ICD-10-CM | POA: Diagnosis not present

## 2019-01-29 DIAGNOSIS — Z992 Dependence on renal dialysis: Secondary | ICD-10-CM | POA: Diagnosis not present

## 2019-01-29 DIAGNOSIS — N186 End stage renal disease: Secondary | ICD-10-CM | POA: Diagnosis not present

## 2019-01-31 ENCOUNTER — Other Ambulatory Visit: Payer: Self-pay | Admitting: Oncology

## 2019-01-31 ENCOUNTER — Other Ambulatory Visit (HOSPITAL_COMMUNITY): Payer: Self-pay | Admitting: Oncology

## 2019-01-31 DIAGNOSIS — C3411 Malignant neoplasm of upper lobe, right bronchus or lung: Secondary | ICD-10-CM

## 2019-01-31 DIAGNOSIS — D631 Anemia in chronic kidney disease: Secondary | ICD-10-CM | POA: Diagnosis not present

## 2019-01-31 DIAGNOSIS — Z992 Dependence on renal dialysis: Secondary | ICD-10-CM | POA: Diagnosis not present

## 2019-01-31 DIAGNOSIS — N2581 Secondary hyperparathyroidism of renal origin: Secondary | ICD-10-CM | POA: Diagnosis not present

## 2019-01-31 DIAGNOSIS — D509 Iron deficiency anemia, unspecified: Secondary | ICD-10-CM | POA: Diagnosis not present

## 2019-01-31 DIAGNOSIS — N186 End stage renal disease: Secondary | ICD-10-CM | POA: Diagnosis not present

## 2019-02-02 DIAGNOSIS — D509 Iron deficiency anemia, unspecified: Secondary | ICD-10-CM | POA: Diagnosis not present

## 2019-02-02 DIAGNOSIS — N2581 Secondary hyperparathyroidism of renal origin: Secondary | ICD-10-CM | POA: Diagnosis not present

## 2019-02-02 DIAGNOSIS — D631 Anemia in chronic kidney disease: Secondary | ICD-10-CM | POA: Diagnosis not present

## 2019-02-02 DIAGNOSIS — Z992 Dependence on renal dialysis: Secondary | ICD-10-CM | POA: Diagnosis not present

## 2019-02-02 DIAGNOSIS — N186 End stage renal disease: Secondary | ICD-10-CM | POA: Diagnosis not present

## 2019-02-05 DIAGNOSIS — N2581 Secondary hyperparathyroidism of renal origin: Secondary | ICD-10-CM | POA: Diagnosis not present

## 2019-02-05 DIAGNOSIS — Z992 Dependence on renal dialysis: Secondary | ICD-10-CM | POA: Diagnosis not present

## 2019-02-05 DIAGNOSIS — D631 Anemia in chronic kidney disease: Secondary | ICD-10-CM | POA: Diagnosis not present

## 2019-02-05 DIAGNOSIS — N186 End stage renal disease: Secondary | ICD-10-CM | POA: Diagnosis not present

## 2019-02-05 DIAGNOSIS — D509 Iron deficiency anemia, unspecified: Secondary | ICD-10-CM | POA: Diagnosis not present

## 2019-02-07 ENCOUNTER — Other Ambulatory Visit: Payer: Self-pay

## 2019-02-07 ENCOUNTER — Ambulatory Visit (HOSPITAL_COMMUNITY)
Admission: RE | Admit: 2019-02-07 | Discharge: 2019-02-07 | Disposition: A | Discharge status: Home / Self Care | Payer: Medicare Other | Source: Ambulatory Visit | Attending: Oncology | Admitting: Oncology

## 2019-02-07 DIAGNOSIS — N186 End stage renal disease: Secondary | ICD-10-CM | POA: Diagnosis not present

## 2019-02-07 DIAGNOSIS — C3411 Malignant neoplasm of upper lobe, right bronchus or lung: Secondary | ICD-10-CM | POA: Diagnosis not present

## 2019-02-07 DIAGNOSIS — Z992 Dependence on renal dialysis: Secondary | ICD-10-CM | POA: Diagnosis not present

## 2019-02-07 DIAGNOSIS — D509 Iron deficiency anemia, unspecified: Secondary | ICD-10-CM | POA: Diagnosis not present

## 2019-02-07 DIAGNOSIS — C3490 Malignant neoplasm of unspecified part of unspecified bronchus or lung: Secondary | ICD-10-CM | POA: Diagnosis not present

## 2019-02-07 DIAGNOSIS — D631 Anemia in chronic kidney disease: Secondary | ICD-10-CM | POA: Diagnosis not present

## 2019-02-07 DIAGNOSIS — N2581 Secondary hyperparathyroidism of renal origin: Secondary | ICD-10-CM | POA: Diagnosis not present

## 2019-02-08 DIAGNOSIS — N186 End stage renal disease: Secondary | ICD-10-CM | POA: Diagnosis not present

## 2019-02-08 DIAGNOSIS — D509 Iron deficiency anemia, unspecified: Secondary | ICD-10-CM | POA: Diagnosis not present

## 2019-02-08 DIAGNOSIS — N2581 Secondary hyperparathyroidism of renal origin: Secondary | ICD-10-CM | POA: Diagnosis not present

## 2019-02-08 DIAGNOSIS — D631 Anemia in chronic kidney disease: Secondary | ICD-10-CM | POA: Diagnosis not present

## 2019-02-08 DIAGNOSIS — Z992 Dependence on renal dialysis: Secondary | ICD-10-CM | POA: Diagnosis not present

## 2019-02-09 DIAGNOSIS — N186 End stage renal disease: Secondary | ICD-10-CM | POA: Diagnosis not present

## 2019-02-09 DIAGNOSIS — N2581 Secondary hyperparathyroidism of renal origin: Secondary | ICD-10-CM | POA: Diagnosis not present

## 2019-02-09 DIAGNOSIS — Z992 Dependence on renal dialysis: Secondary | ICD-10-CM | POA: Diagnosis not present

## 2019-02-09 DIAGNOSIS — D509 Iron deficiency anemia, unspecified: Secondary | ICD-10-CM | POA: Diagnosis not present

## 2019-02-09 DIAGNOSIS — D631 Anemia in chronic kidney disease: Secondary | ICD-10-CM | POA: Diagnosis not present

## 2019-02-12 DIAGNOSIS — D509 Iron deficiency anemia, unspecified: Secondary | ICD-10-CM | POA: Diagnosis not present

## 2019-02-12 DIAGNOSIS — D631 Anemia in chronic kidney disease: Secondary | ICD-10-CM | POA: Diagnosis not present

## 2019-02-12 DIAGNOSIS — N186 End stage renal disease: Secondary | ICD-10-CM | POA: Diagnosis not present

## 2019-02-12 DIAGNOSIS — N2581 Secondary hyperparathyroidism of renal origin: Secondary | ICD-10-CM | POA: Diagnosis not present

## 2019-02-12 DIAGNOSIS — Z992 Dependence on renal dialysis: Secondary | ICD-10-CM | POA: Diagnosis not present

## 2019-02-13 ENCOUNTER — Other Ambulatory Visit: Payer: Self-pay | Admitting: Oncology

## 2019-02-13 DIAGNOSIS — Z992 Dependence on renal dialysis: Secondary | ICD-10-CM | POA: Diagnosis not present

## 2019-02-13 DIAGNOSIS — C641 Malignant neoplasm of right kidney, except renal pelvis: Secondary | ICD-10-CM | POA: Diagnosis not present

## 2019-02-13 DIAGNOSIS — J439 Emphysema, unspecified: Secondary | ICD-10-CM | POA: Diagnosis not present

## 2019-02-13 DIAGNOSIS — C3411 Malignant neoplasm of upper lobe, right bronchus or lung: Secondary | ICD-10-CM | POA: Diagnosis not present

## 2019-02-13 DIAGNOSIS — N186 End stage renal disease: Secondary | ICD-10-CM | POA: Diagnosis not present

## 2019-02-13 DIAGNOSIS — N185 Chronic kidney disease, stage 5: Secondary | ICD-10-CM | POA: Diagnosis not present

## 2019-02-13 DIAGNOSIS — Z85528 Personal history of other malignant neoplasm of kidney: Secondary | ICD-10-CM | POA: Diagnosis not present

## 2019-02-13 DIAGNOSIS — Z85118 Personal history of other malignant neoplasm of bronchus and lung: Secondary | ICD-10-CM | POA: Diagnosis not present

## 2019-02-14 DIAGNOSIS — N186 End stage renal disease: Secondary | ICD-10-CM | POA: Diagnosis not present

## 2019-02-14 DIAGNOSIS — N2581 Secondary hyperparathyroidism of renal origin: Secondary | ICD-10-CM | POA: Diagnosis not present

## 2019-02-14 DIAGNOSIS — D631 Anemia in chronic kidney disease: Secondary | ICD-10-CM | POA: Diagnosis not present

## 2019-02-14 DIAGNOSIS — Z992 Dependence on renal dialysis: Secondary | ICD-10-CM | POA: Diagnosis not present

## 2019-02-14 DIAGNOSIS — D509 Iron deficiency anemia, unspecified: Secondary | ICD-10-CM | POA: Diagnosis not present

## 2019-02-16 DIAGNOSIS — D631 Anemia in chronic kidney disease: Secondary | ICD-10-CM | POA: Diagnosis not present

## 2019-02-16 DIAGNOSIS — D509 Iron deficiency anemia, unspecified: Secondary | ICD-10-CM | POA: Diagnosis not present

## 2019-02-16 DIAGNOSIS — Z992 Dependence on renal dialysis: Secondary | ICD-10-CM | POA: Diagnosis not present

## 2019-02-16 DIAGNOSIS — N2581 Secondary hyperparathyroidism of renal origin: Secondary | ICD-10-CM | POA: Diagnosis not present

## 2019-02-16 DIAGNOSIS — N186 End stage renal disease: Secondary | ICD-10-CM | POA: Diagnosis not present

## 2019-02-19 DIAGNOSIS — D509 Iron deficiency anemia, unspecified: Secondary | ICD-10-CM | POA: Diagnosis not present

## 2019-02-19 DIAGNOSIS — N186 End stage renal disease: Secondary | ICD-10-CM | POA: Diagnosis not present

## 2019-02-19 DIAGNOSIS — Z992 Dependence on renal dialysis: Secondary | ICD-10-CM | POA: Diagnosis not present

## 2019-02-19 DIAGNOSIS — N2581 Secondary hyperparathyroidism of renal origin: Secondary | ICD-10-CM | POA: Diagnosis not present

## 2019-02-19 DIAGNOSIS — D631 Anemia in chronic kidney disease: Secondary | ICD-10-CM | POA: Diagnosis not present

## 2019-02-21 ENCOUNTER — Ambulatory Visit (HOSPITAL_COMMUNITY): Payer: Medicare Other

## 2019-02-21 DIAGNOSIS — N2581 Secondary hyperparathyroidism of renal origin: Secondary | ICD-10-CM | POA: Diagnosis not present

## 2019-02-21 DIAGNOSIS — Z992 Dependence on renal dialysis: Secondary | ICD-10-CM | POA: Diagnosis not present

## 2019-02-21 DIAGNOSIS — N186 End stage renal disease: Secondary | ICD-10-CM | POA: Diagnosis not present

## 2019-02-21 DIAGNOSIS — D509 Iron deficiency anemia, unspecified: Secondary | ICD-10-CM | POA: Diagnosis not present

## 2019-02-21 DIAGNOSIS — D631 Anemia in chronic kidney disease: Secondary | ICD-10-CM | POA: Diagnosis not present

## 2019-02-23 DIAGNOSIS — D631 Anemia in chronic kidney disease: Secondary | ICD-10-CM | POA: Diagnosis not present

## 2019-02-23 DIAGNOSIS — N186 End stage renal disease: Secondary | ICD-10-CM | POA: Diagnosis not present

## 2019-02-23 DIAGNOSIS — N2581 Secondary hyperparathyroidism of renal origin: Secondary | ICD-10-CM | POA: Diagnosis not present

## 2019-02-23 DIAGNOSIS — D509 Iron deficiency anemia, unspecified: Secondary | ICD-10-CM | POA: Diagnosis not present

## 2019-02-23 DIAGNOSIS — Z992 Dependence on renal dialysis: Secondary | ICD-10-CM | POA: Diagnosis not present

## 2019-02-26 DIAGNOSIS — D631 Anemia in chronic kidney disease: Secondary | ICD-10-CM | POA: Diagnosis not present

## 2019-02-26 DIAGNOSIS — N2581 Secondary hyperparathyroidism of renal origin: Secondary | ICD-10-CM | POA: Diagnosis not present

## 2019-02-26 DIAGNOSIS — Z992 Dependence on renal dialysis: Secondary | ICD-10-CM | POA: Diagnosis not present

## 2019-02-26 DIAGNOSIS — N186 End stage renal disease: Secondary | ICD-10-CM | POA: Diagnosis not present

## 2019-02-26 DIAGNOSIS — D509 Iron deficiency anemia, unspecified: Secondary | ICD-10-CM | POA: Diagnosis not present

## 2019-02-28 DIAGNOSIS — N2581 Secondary hyperparathyroidism of renal origin: Secondary | ICD-10-CM | POA: Diagnosis not present

## 2019-02-28 DIAGNOSIS — Z992 Dependence on renal dialysis: Secondary | ICD-10-CM | POA: Diagnosis not present

## 2019-02-28 DIAGNOSIS — D631 Anemia in chronic kidney disease: Secondary | ICD-10-CM | POA: Diagnosis not present

## 2019-02-28 DIAGNOSIS — N186 End stage renal disease: Secondary | ICD-10-CM | POA: Diagnosis not present

## 2019-02-28 DIAGNOSIS — D509 Iron deficiency anemia, unspecified: Secondary | ICD-10-CM | POA: Diagnosis not present

## 2019-03-02 DIAGNOSIS — N2581 Secondary hyperparathyroidism of renal origin: Secondary | ICD-10-CM | POA: Diagnosis not present

## 2019-03-02 DIAGNOSIS — D509 Iron deficiency anemia, unspecified: Secondary | ICD-10-CM | POA: Diagnosis not present

## 2019-03-02 DIAGNOSIS — D631 Anemia in chronic kidney disease: Secondary | ICD-10-CM | POA: Diagnosis not present

## 2019-03-02 DIAGNOSIS — N186 End stage renal disease: Secondary | ICD-10-CM | POA: Diagnosis not present

## 2019-03-02 DIAGNOSIS — Z992 Dependence on renal dialysis: Secondary | ICD-10-CM | POA: Diagnosis not present

## 2019-03-05 DIAGNOSIS — Z992 Dependence on renal dialysis: Secondary | ICD-10-CM | POA: Diagnosis not present

## 2019-03-05 DIAGNOSIS — D631 Anemia in chronic kidney disease: Secondary | ICD-10-CM | POA: Diagnosis not present

## 2019-03-05 DIAGNOSIS — N186 End stage renal disease: Secondary | ICD-10-CM | POA: Diagnosis not present

## 2019-03-05 DIAGNOSIS — N2581 Secondary hyperparathyroidism of renal origin: Secondary | ICD-10-CM | POA: Diagnosis not present

## 2019-03-05 DIAGNOSIS — D509 Iron deficiency anemia, unspecified: Secondary | ICD-10-CM | POA: Diagnosis not present

## 2019-03-07 DIAGNOSIS — N186 End stage renal disease: Secondary | ICD-10-CM | POA: Diagnosis not present

## 2019-03-07 DIAGNOSIS — D631 Anemia in chronic kidney disease: Secondary | ICD-10-CM | POA: Diagnosis not present

## 2019-03-07 DIAGNOSIS — Z992 Dependence on renal dialysis: Secondary | ICD-10-CM | POA: Diagnosis not present

## 2019-03-07 DIAGNOSIS — N2581 Secondary hyperparathyroidism of renal origin: Secondary | ICD-10-CM | POA: Diagnosis not present

## 2019-03-07 DIAGNOSIS — D509 Iron deficiency anemia, unspecified: Secondary | ICD-10-CM | POA: Diagnosis not present

## 2019-03-09 ENCOUNTER — Other Ambulatory Visit: Payer: Self-pay

## 2019-03-09 ENCOUNTER — Encounter (HOSPITAL_COMMUNITY): Payer: Self-pay

## 2019-03-09 ENCOUNTER — Emergency Department (HOSPITAL_COMMUNITY): Payer: Medicare Other

## 2019-03-09 ENCOUNTER — Emergency Department (HOSPITAL_COMMUNITY)
Admission: EM | Admit: 2019-03-09 | Discharge: 2019-03-09 | Disposition: A | Discharge status: Home / Self Care | Payer: Medicare Other | Attending: Emergency Medicine | Admitting: Emergency Medicine

## 2019-03-09 DIAGNOSIS — J449 Chronic obstructive pulmonary disease, unspecified: Secondary | ICD-10-CM | POA: Diagnosis not present

## 2019-03-09 DIAGNOSIS — W19XXXA Unspecified fall, initial encounter: Secondary | ICD-10-CM

## 2019-03-09 DIAGNOSIS — W108XXA Fall (on) (from) other stairs and steps, initial encounter: Secondary | ICD-10-CM | POA: Insufficient documentation

## 2019-03-09 DIAGNOSIS — Y92008 Other place in unspecified non-institutional (private) residence as the place of occurrence of the external cause: Secondary | ICD-10-CM | POA: Diagnosis not present

## 2019-03-09 DIAGNOSIS — M25551 Pain in right hip: Secondary | ICD-10-CM | POA: Insufficient documentation

## 2019-03-09 DIAGNOSIS — I120 Hypertensive chronic kidney disease with stage 5 chronic kidney disease or end stage renal disease: Secondary | ICD-10-CM | POA: Insufficient documentation

## 2019-03-09 DIAGNOSIS — Y9301 Activity, walking, marching and hiking: Secondary | ICD-10-CM | POA: Insufficient documentation

## 2019-03-09 DIAGNOSIS — Z87891 Personal history of nicotine dependence: Secondary | ICD-10-CM | POA: Insufficient documentation

## 2019-03-09 DIAGNOSIS — Z96643 Presence of artificial hip joint, bilateral: Secondary | ICD-10-CM | POA: Insufficient documentation

## 2019-03-09 DIAGNOSIS — Z85118 Personal history of other malignant neoplasm of bronchus and lung: Secondary | ICD-10-CM | POA: Insufficient documentation

## 2019-03-09 DIAGNOSIS — S79911A Unspecified injury of right hip, initial encounter: Secondary | ICD-10-CM | POA: Diagnosis not present

## 2019-03-09 DIAGNOSIS — Z79899 Other long term (current) drug therapy: Secondary | ICD-10-CM | POA: Diagnosis not present

## 2019-03-09 DIAGNOSIS — Y999 Unspecified external cause status: Secondary | ICD-10-CM | POA: Insufficient documentation

## 2019-03-09 DIAGNOSIS — Z992 Dependence on renal dialysis: Secondary | ICD-10-CM | POA: Diagnosis not present

## 2019-03-09 DIAGNOSIS — Z96652 Presence of left artificial knee joint: Secondary | ICD-10-CM | POA: Insufficient documentation

## 2019-03-09 DIAGNOSIS — D509 Iron deficiency anemia, unspecified: Secondary | ICD-10-CM | POA: Diagnosis not present

## 2019-03-09 DIAGNOSIS — N186 End stage renal disease: Secondary | ICD-10-CM | POA: Insufficient documentation

## 2019-03-09 DIAGNOSIS — R2243 Localized swelling, mass and lump, lower limb, bilateral: Secondary | ICD-10-CM | POA: Insufficient documentation

## 2019-03-09 DIAGNOSIS — N2581 Secondary hyperparathyroidism of renal origin: Secondary | ICD-10-CM | POA: Diagnosis not present

## 2019-03-09 DIAGNOSIS — D631 Anemia in chronic kidney disease: Secondary | ICD-10-CM | POA: Diagnosis not present

## 2019-03-09 DIAGNOSIS — Z905 Acquired absence of kidney: Secondary | ICD-10-CM | POA: Insufficient documentation

## 2019-03-09 NOTE — ED Triage Notes (Signed)
Pt states he was walking out the door this morning to go to dialysis and fell down a couple of steps. Pt states he hit both his feet together and fell. Pt denies LOC or hitting head. Pt states he landed on his left side. Pt c/o right hip pain. Pt denies any dizziness prior to fall.

## 2019-03-09 NOTE — Discharge Instructions (Signed)
1. Medications: Can take 1-2 tablets of Tylenol every 6 hours as needed for pain. Do not exceed 4000 mg of Tylenol daily.   2. Treatment: rest, apply ice or heat, elevate and use walker to help you get around, drink plenty of fluids, gentle stretching 3. Follow Up: Please followup with orthopedics as directed or your PCP in 1 week if no improvement for discussion of your diagnoses and further evaluation after today's visit; if you do not have a primary care doctor use the resource guide provided to find one; Please return to the ER for worsening symptoms or other concerns such as worsening swelling, redness of the skin, fevers, loss of pulses, or loss of feeling

## 2019-03-09 NOTE — Clinical Social Work Note (Signed)
ED PA advised that patient she had ordered [place] (RN, PT, OT, aide) for patient. Patient referred to Cassie at [facility]. Patient discharged from ED.   [name], Clydene Pugh, LCSW

## 2019-03-09 NOTE — ED Provider Notes (Signed)
[facility] EMERGENCY DEPARTMENT Provider Note   CSN: [id number] Arrival date & time: 03/09/19  1129     History   Chief Complaint Chief Complaint  Patient presents with   Fall    HPI [name] is a 85 y.o. male with history of COPD, ESRD on dialysis Monday Wednesday Friday, GERD, hypertension, lung cancer presents for evaluation of acute onset, intermittent right hip pain secondary to mechanical fall earlier today around 5 AM.  He reports that he was outside walking down some stairs on his way to go to dialysis when he tripped over his feet, landing on his buttocks, mostly on the left but some on the right.  Denies head injury or loss of consciousness.  Since then he has had intermittent pains to the right hip with particular movements and with bearing weight but denies any pain at rest.  The pain does not radiate, denies numbness or weakness of the lower extremities.  He denies any low back pain, difficulty urinating (still produces urine), nausea, vomiting, headache, or vision changes.  Has not tried anything for his symptoms.  He ambulates with the aid of a cane at baseline.  He is not on any blood thinners.  He did receive his full dialysis treatment today.  He is status post right hip arthroplasty 12 years ago.     The history is provided by the patient.    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 /w prednisone x 17 yrs. has been d/c for 3 yrs.    Chronic kidney disease        CIDP (chronic inflammatory demyelinating polyneuropathy) (HCC)    COPD (chronic obstructive pulmonary disease) (HCC)    told that he has "a bit of emphysema"   Dialysis patient ([place])    GERD (gastroesophageal reflux disease)    Hypertension    stress test done  - many years ago   Kidney calculus    Pneumonia     Patient Active Problem List   Diagnosis Date Noted   Community acquired pneumonia 05/30/2017   Influenza A 05/30/2017   BPH (benign prostatic hypertrophy) 05/30/2017   COPD (chronic obstructive pulmonary disease) ([place]) 05/30/2017   Hypertension 05/30/2017   GERD (gastroesophageal reflux disease) 05/30/2017   Pulmonary nodules/lesions, multiple 09/14/2016   Bowel habit changes 09/11/2015   Malignant neoplasm of right upper lobe of lung ([place]) 10/27/2012   Visit for wound check 10/09/2012   Swollen arm 09/15/2012   Encounter for adequacy testing for hemodialysis ([place]) 09/01/2012   Aftercare following surgery of the circulatory system, NEC 39/07/90   Other complications due to renal dialysis device, implant, and graft 04/19/2012   End stage renal disease ([place]) 07/14/2011   Pre-operative cardiovascular examination 04/07/2011    Past Surgical History:  Procedure Laterality Date   ANKLE FUSION     R ankle   AV FISTULA PLACEMENT  06/01/2011   Procedure: ARTERIOVENOUS (AV) FISTULA CREATION;  Surgeon: Angelia Mould, MD;  Location: [facility] OR;  Service: Vascular;  Laterality: Left;  Creation of Left Arteriovenous fistula   AV FISTULA PLACEMENT Left 07/12/2012   Procedure: ARTERIOVENOUS (AV) FISTULA CREATION;  Surgeon: Conrad Darien, MD;  Location: [place];  Service: Vascular;  Laterality: Left;   AV FISTULA PLACEMENT Left 9.62.95   BASCILIC VEIN TRANSPOSITION Left 08/28/2012   Procedure: BASCILIC VEIN TRANSPOSITION;  Surgeon: Conrad Garnet, MD;  Location: [place];  Service: Vascular;  Laterality: Left;  left 2nd stage basilic vein transposition   CHOLECYSTECTOMY     COLONOSCOPY  2005   Dr. Laural Golden, diverticulosis, hemorrhoids, 2 small polyps   FISTULOGRAM N/A 08/16/2011   Procedure: FISTULOGRAM;  Surgeon: Angelia Mould, MD;  Location: [facility] CATH LAB;  Service: Cardiovascular;  Laterality: N/A;   fistulogram left  radial cephalic AV fistula  [phone]     Dr. Scot Dock   HIP ARTHROPLASTY Right    "partial"   JOINT REPLACEMENT     L [facility], L hip partial replacement    LUNG REMOVAL, PARTIAL  2003   partial, right   NEPHRECTOMY  2001   left   SHUNTOGRAM N/A 05/15/2012   Procedure: Earney Mallet;  Surgeon: Conrad Bonita, MD;  Location: [facility] CATH LAB;  Service: Cardiovascular;  Laterality: N/A;   SHUNTOGRAM N/A 05/09/2014   Procedure: FISTULOGRAM;  Surgeon: Conrad Fronton, MD;  Location: [facility] CATH LAB;  Service: Cardiovascular;  Laterality: N/A;        Home Medications    Prior to Admission medications   Medication Sig Start Date End Date Taking? Authorizing Provider  clobetasol cream (TEMOVATE) 0.10 % Apply 1 application topically 2 (two) times daily as needed (itchy ears).    [provider]  lidocaine-prilocaine (EMLA) cream Apply 1 application topically as directed. Apply to access site for dialysis 1 hour prior to visit 12/17/14   [provider]  metoprolol tartrate (LOPRESSOR) 25 MG tablet Take 1 tablet (25 mg total) by mouth 2 (two) times daily. 12/27/17   Arnoldo Lenis, MD  omeprazole (PRILOSEC) 20 MG capsule Take 20 mg by mouth daily.    [provider]  sevelamer carbonate (RENVELA) 800 MG tablet Take 800-1,600 mg by mouth as directed. Take 2 tablets (1600mg ) three times a day with meals and 1 tablet (800mg ) with a snack    [provider]    Family History Family History  Problem Relation Age of Onset   Heart disease Mother    Heart disease Father    Heart attack Father    Anesthesia problems Neg Hx    Hypotension Neg Hx    Malignant hyperthermia Neg Hx    Pseudochol deficiency Neg Hx    Colon cancer Neg Hx     Social History Social History   Tobacco Use   Smoking status: Former Smoker    Years: 35.00    Types: Cigarettes    Quit date: 02/04/1977    Years since quitting: 42.1   Smokeless tobacco: Current User     Types: Chew   Tobacco comment: Quit in 1978  Substance Use Topics   Alcohol use: No    Alcohol/week: 0.0 standard drinks   Drug use: No     Allergies   Patient has no known allergies.   Review of Systems Review of Systems  Constitutional: Negative for chills and fever.  Eyes: Negative for photophobia and visual disturbance.  Respiratory: Negative for shortness of breath.   Cardiovascular: Negative for chest pain.  Gastrointestinal: Negative for abdominal pain, nausea and vomiting.  Musculoskeletal: Positive for arthralgias. Negative for back pain.  Neurological: Negative for syncope, weakness, numbness and headaches.  All other systems reviewed and are negative.    Physical Exam  Updated Vital Signs BP (!) 155/68 (BP Location: Right Arm)    Pulse 77    Temp 97.6 F (36.4 C) (Tympanic)    Resp 18    Ht 5\' 6"  (1.676 m)    Wt 82.6 kg    SpO2 97%    BMI 29.38 kg/m   Physical Exam Vitals signs and nursing note reviewed.  Constitutional:      General: He is not in acute distress.    Appearance: He is well-developed.  HENT:     Head: Normocephalic and atraumatic.     Comments: No Battle's signs, no raccoon's eyes, no rhinorrhea. No tenderness to palpation of the face or skull. No deformity, crepitus, or swelling noted.  Eyes:     General:        Right eye: No discharge.        Left eye: No discharge.     Conjunctiva/sclera: Conjunctivae normal.  Neck:     Musculoskeletal: Neck supple.     Vascular: No JVD.     Trachea: No tracheal deviation.  Cardiovascular:     Rate and Rhythm: Normal rate and regular rhythm.     Pulses: Normal pulses.     Comments: 2+ DP/PT pulses bilaterally.  Trace pitting edema of the bilateral lower extremities which patient states is chronic and unchanged.  AV fistula in the left upper extremity with palpable thrill Pulmonary:     Effort: Pulmonary effort is normal.     Breath sounds: Normal breath sounds.  Abdominal:     General: Bowel  sounds are normal. There is no distension.     Palpations: Abdomen is soft.     Tenderness: There is no abdominal tenderness. There is no guarding or rebound.  Musculoskeletal: Normal range of motion.     Comments: No midline spine TTP, no paraspinal muscle tenderness, no deformity, crepitus, or step-off noted. 5/5 strength of BLE major muscle groups. Some pain with right hip flexion.  No tenderness to palpation of the pelvis which appears stable.  No crepitus or deformity noted. No ecchymosis of the hips bilaterally. Normal passive range of motion of the hips bilaterally with pain with internal rotation of the right hip.   Skin:    General: Skin is warm and dry.     Findings: No erythema.  Neurological:     Mental Status: He is alert.     Comments: Fluent speech with no evidence of dysarthria or aphasia.  No facial droop.  Cranial nerves appear grossly intact.  Moves all extremities spontaneously without difficulty.  Sensation intact to light touch of bilateral lower extremities. Ambulates with steady gait and balance with some assistance.   Psychiatric:        Behavior: Behavior normal.      ED Treatments / Results  Labs (all labs ordered are listed, but only abnormal results are displayed) Labs Reviewed - No data to display  EKG None  Radiology Dg Hip Unilat With Pelvis 2-3 Views Right  Result Date: 03/09/2019 CLINICAL DATA:  Fall. EXAM: DG HIP (WITH OR WITHOUT PELVIS) 2-3V RIGHT COMPARISON:  10/01/2018. FINDINGS: Diffuse severe osteopenia. Degenerative change lumbar spine and left hip. Right hip replacement. Hardware intact. Anatomic alignment. No acute abnormality identified. Peripheral vascular calcification. IMPRESSION: 1.  No acute abnormality. 2. Right hip replacement with anatomic alignment and stable appearance. Diffuse osteopenia and degenerative change again noted. 3.  Peripheral vascular disease. Electronically Signed   By: Marcello Moores  Register   On: 03/09/2019 12:45  Procedures Procedures (including critical care time)  Medications Ordered in ED Medications - No data to display   Initial Impression / Assessment and Plan / ED Course  I have reviewed the triage vital signs and the nursing notes.  Pertinent labs & imaging results that were available during my care of the patient were reviewed by me and considered in my medical decision making (see chart for details).       Patient presenting for evaluation of right hip pain status post mechanical fall this morning around 5 AM.  He is afebrile, vital signs are at patient's baseline.  He is nontoxic in appearance.  He is neurovascularly intact.  He is able to ambulate and bear weight with little assistance, has a cane and walker at home.  No midline spine tenderness.  No signs of serious head injury, intrathoracic or intra-abdominal injury.  Prodrome leading up to the fall.  Radiographs of the right hip show no acute osseous abnormality.  Conservative therapy indicated and discussed with patient including Tylenol, ice, gentle stretching.  He has had multiple orthopedic surgeries and has an orthopedist and PCP with whom he can follow-up.  I spoke with the patient's niece who is also his power of attorney.  We discussed the utility of home health and she would like to proceed with this; she states that she can be reached at [phone] if the case management needs any assistance setting this up for him.  I discussed strict ED return precautions with the patient and his niece.  They have verbalized understanding of and agreement with plan and patient is stable for discharge home at this time.  Final Clinical Impressions(s) / ED Diagnoses   Final diagnoses:  Fall, initial encounter  Acute right hip pain    ED Discharge Orders         [place]     03/09/19 1320    Face-to-face encounter (required for Medicare/Medicaid patients)    Comments: [facility] certify that this patient is under  my care and that I, or a nurse practitioner or physician's assistant working with me, had a face-to-face encounter that meets the physician face-to-face encounter requirements with this patient on 03/09/2019. The encounter with the patient was in whole, or in part for the following medical condition(s) which is the primary reason for home health care (List medical condition): fall, right hip pain   03/09/19 [address] PA-C 03/09/19 1324    Noemi Chapel, MD 03/09/19 1326

## 2019-03-09 NOTE — ED Notes (Signed)
Pt reports he is ambulatory at home with a cane that he has used since injury  He reports he has no pain currently  Awaiting rad report

## 2019-03-09 NOTE — ED Notes (Signed)
To Rad 

## 2019-03-09 NOTE — ED Notes (Signed)
Dr Sabra Heck to assess

## 2019-03-10 DIAGNOSIS — C3411 Malignant neoplasm of upper lobe, right bronchus or lung: Secondary | ICD-10-CM | POA: Diagnosis not present

## 2019-03-10 DIAGNOSIS — C649 Malignant neoplasm of unspecified kidney, except renal pelvis: Secondary | ICD-10-CM | POA: Diagnosis not present

## 2019-03-10 DIAGNOSIS — Z96652 Presence of left artificial knee joint: Secondary | ICD-10-CM | POA: Diagnosis not present

## 2019-03-10 DIAGNOSIS — N186 End stage renal disease: Secondary | ICD-10-CM | POA: Diagnosis not present

## 2019-03-10 DIAGNOSIS — Z96642 Presence of left artificial hip joint: Secondary | ICD-10-CM | POA: Diagnosis not present

## 2019-03-10 DIAGNOSIS — Z96641 Presence of right artificial hip joint: Secondary | ICD-10-CM | POA: Diagnosis not present

## 2019-03-10 DIAGNOSIS — G6181 Chronic inflammatory demyelinating polyneuritis: Secondary | ICD-10-CM | POA: Diagnosis not present

## 2019-03-10 DIAGNOSIS — Z992 Dependence on renal dialysis: Secondary | ICD-10-CM | POA: Diagnosis not present

## 2019-03-10 DIAGNOSIS — F17290 Nicotine dependence, other tobacco product, uncomplicated: Secondary | ICD-10-CM | POA: Diagnosis not present

## 2019-03-10 DIAGNOSIS — I739 Peripheral vascular disease, unspecified: Secondary | ICD-10-CM | POA: Diagnosis not present

## 2019-03-10 DIAGNOSIS — M150 Primary generalized (osteo)arthritis: Secondary | ICD-10-CM | POA: Diagnosis not present

## 2019-03-10 DIAGNOSIS — N400 Enlarged prostate without lower urinary tract symptoms: Secondary | ICD-10-CM | POA: Diagnosis not present

## 2019-03-10 DIAGNOSIS — M25551 Pain in right hip: Secondary | ICD-10-CM | POA: Diagnosis not present

## 2019-03-10 DIAGNOSIS — I120 Hypertensive chronic kidney disease with stage 5 chronic kidney disease or end stage renal disease: Secondary | ICD-10-CM | POA: Diagnosis not present

## 2019-03-10 DIAGNOSIS — R296 Repeated falls: Secondary | ICD-10-CM | POA: Diagnosis not present

## 2019-03-10 DIAGNOSIS — J449 Chronic obstructive pulmonary disease, unspecified: Secondary | ICD-10-CM | POA: Diagnosis not present

## 2019-03-11 DIAGNOSIS — Z992 Dependence on renal dialysis: Secondary | ICD-10-CM | POA: Diagnosis not present

## 2019-03-11 DIAGNOSIS — Z23 Encounter for immunization: Secondary | ICD-10-CM | POA: Diagnosis not present

## 2019-03-11 DIAGNOSIS — N186 End stage renal disease: Secondary | ICD-10-CM | POA: Diagnosis not present

## 2019-03-11 DIAGNOSIS — N2581 Secondary hyperparathyroidism of renal origin: Secondary | ICD-10-CM | POA: Diagnosis not present

## 2019-03-11 DIAGNOSIS — D631 Anemia in chronic kidney disease: Secondary | ICD-10-CM | POA: Diagnosis not present

## 2019-03-11 DIAGNOSIS — D509 Iron deficiency anemia, unspecified: Secondary | ICD-10-CM | POA: Diagnosis not present

## 2019-03-12 DIAGNOSIS — D509 Iron deficiency anemia, unspecified: Secondary | ICD-10-CM | POA: Diagnosis not present

## 2019-03-12 DIAGNOSIS — Z23 Encounter for immunization: Secondary | ICD-10-CM | POA: Diagnosis not present

## 2019-03-12 DIAGNOSIS — N2581 Secondary hyperparathyroidism of renal origin: Secondary | ICD-10-CM | POA: Diagnosis not present

## 2019-03-12 DIAGNOSIS — D631 Anemia in chronic kidney disease: Secondary | ICD-10-CM | POA: Diagnosis not present

## 2019-03-12 DIAGNOSIS — Z992 Dependence on renal dialysis: Secondary | ICD-10-CM | POA: Diagnosis not present

## 2019-03-12 DIAGNOSIS — N186 End stage renal disease: Secondary | ICD-10-CM | POA: Diagnosis not present

## 2019-03-13 DIAGNOSIS — C649 Malignant neoplasm of unspecified kidney, except renal pelvis: Secondary | ICD-10-CM | POA: Diagnosis not present

## 2019-03-13 DIAGNOSIS — R296 Repeated falls: Secondary | ICD-10-CM | POA: Diagnosis not present

## 2019-03-13 DIAGNOSIS — N186 End stage renal disease: Secondary | ICD-10-CM | POA: Diagnosis not present

## 2019-03-13 DIAGNOSIS — I120 Hypertensive chronic kidney disease with stage 5 chronic kidney disease or end stage renal disease: Secondary | ICD-10-CM | POA: Diagnosis not present

## 2019-03-13 DIAGNOSIS — M150 Primary generalized (osteo)arthritis: Secondary | ICD-10-CM | POA: Diagnosis not present

## 2019-03-13 DIAGNOSIS — C3411 Malignant neoplasm of upper lobe, right bronchus or lung: Secondary | ICD-10-CM | POA: Diagnosis not present

## 2019-03-14 DIAGNOSIS — D631 Anemia in chronic kidney disease: Secondary | ICD-10-CM | POA: Diagnosis not present

## 2019-03-14 DIAGNOSIS — Z992 Dependence on renal dialysis: Secondary | ICD-10-CM | POA: Diagnosis not present

## 2019-03-14 DIAGNOSIS — Z23 Encounter for immunization: Secondary | ICD-10-CM | POA: Diagnosis not present

## 2019-03-14 DIAGNOSIS — D509 Iron deficiency anemia, unspecified: Secondary | ICD-10-CM | POA: Diagnosis not present

## 2019-03-14 DIAGNOSIS — N2581 Secondary hyperparathyroidism of renal origin: Secondary | ICD-10-CM | POA: Diagnosis not present

## 2019-03-14 DIAGNOSIS — N186 End stage renal disease: Secondary | ICD-10-CM | POA: Diagnosis not present

## 2019-03-15 DIAGNOSIS — C3411 Malignant neoplasm of upper lobe, right bronchus or lung: Secondary | ICD-10-CM | POA: Diagnosis not present

## 2019-03-15 DIAGNOSIS — M150 Primary generalized (osteo)arthritis: Secondary | ICD-10-CM | POA: Diagnosis not present

## 2019-03-15 DIAGNOSIS — N186 End stage renal disease: Secondary | ICD-10-CM | POA: Diagnosis not present

## 2019-03-15 DIAGNOSIS — R296 Repeated falls: Secondary | ICD-10-CM | POA: Diagnosis not present

## 2019-03-15 DIAGNOSIS — I120 Hypertensive chronic kidney disease with stage 5 chronic kidney disease or end stage renal disease: Secondary | ICD-10-CM | POA: Diagnosis not present

## 2019-03-15 DIAGNOSIS — C649 Malignant neoplasm of unspecified kidney, except renal pelvis: Secondary | ICD-10-CM | POA: Diagnosis not present

## 2019-03-16 DIAGNOSIS — Z992 Dependence on renal dialysis: Secondary | ICD-10-CM | POA: Diagnosis not present

## 2019-03-16 DIAGNOSIS — N186 End stage renal disease: Secondary | ICD-10-CM | POA: Diagnosis not present

## 2019-03-16 DIAGNOSIS — Z23 Encounter for immunization: Secondary | ICD-10-CM | POA: Diagnosis not present

## 2019-03-16 DIAGNOSIS — D631 Anemia in chronic kidney disease: Secondary | ICD-10-CM | POA: Diagnosis not present

## 2019-03-16 DIAGNOSIS — D509 Iron deficiency anemia, unspecified: Secondary | ICD-10-CM | POA: Diagnosis not present

## 2019-03-16 DIAGNOSIS — N2581 Secondary hyperparathyroidism of renal origin: Secondary | ICD-10-CM | POA: Diagnosis not present

## 2019-03-19 DIAGNOSIS — N2581 Secondary hyperparathyroidism of renal origin: Secondary | ICD-10-CM | POA: Diagnosis not present

## 2019-03-19 DIAGNOSIS — Z992 Dependence on renal dialysis: Secondary | ICD-10-CM | POA: Diagnosis not present

## 2019-03-19 DIAGNOSIS — Z23 Encounter for immunization: Secondary | ICD-10-CM | POA: Diagnosis not present

## 2019-03-19 DIAGNOSIS — D509 Iron deficiency anemia, unspecified: Secondary | ICD-10-CM | POA: Diagnosis not present

## 2019-03-19 DIAGNOSIS — N186 End stage renal disease: Secondary | ICD-10-CM | POA: Diagnosis not present

## 2019-03-19 DIAGNOSIS — D631 Anemia in chronic kidney disease: Secondary | ICD-10-CM | POA: Diagnosis not present

## 2019-03-20 DIAGNOSIS — C3411 Malignant neoplasm of upper lobe, right bronchus or lung: Secondary | ICD-10-CM | POA: Diagnosis not present

## 2019-03-20 DIAGNOSIS — R296 Repeated falls: Secondary | ICD-10-CM | POA: Diagnosis not present

## 2019-03-20 DIAGNOSIS — C649 Malignant neoplasm of unspecified kidney, except renal pelvis: Secondary | ICD-10-CM | POA: Diagnosis not present

## 2019-03-20 DIAGNOSIS — N186 End stage renal disease: Secondary | ICD-10-CM | POA: Diagnosis not present

## 2019-03-20 DIAGNOSIS — M150 Primary generalized (osteo)arthritis: Secondary | ICD-10-CM | POA: Diagnosis not present

## 2019-03-20 DIAGNOSIS — I120 Hypertensive chronic kidney disease with stage 5 chronic kidney disease or end stage renal disease: Secondary | ICD-10-CM | POA: Diagnosis not present

## 2019-03-21 DIAGNOSIS — N186 End stage renal disease: Secondary | ICD-10-CM | POA: Diagnosis not present

## 2019-03-21 DIAGNOSIS — D509 Iron deficiency anemia, unspecified: Secondary | ICD-10-CM | POA: Diagnosis not present

## 2019-03-21 DIAGNOSIS — Z992 Dependence on renal dialysis: Secondary | ICD-10-CM | POA: Diagnosis not present

## 2019-03-21 DIAGNOSIS — D631 Anemia in chronic kidney disease: Secondary | ICD-10-CM | POA: Diagnosis not present

## 2019-03-21 DIAGNOSIS — N2581 Secondary hyperparathyroidism of renal origin: Secondary | ICD-10-CM | POA: Diagnosis not present

## 2019-03-21 DIAGNOSIS — Z23 Encounter for immunization: Secondary | ICD-10-CM | POA: Diagnosis not present

## 2019-03-22 DIAGNOSIS — N186 End stage renal disease: Secondary | ICD-10-CM | POA: Diagnosis not present

## 2019-03-22 DIAGNOSIS — C649 Malignant neoplasm of unspecified kidney, except renal pelvis: Secondary | ICD-10-CM | POA: Diagnosis not present

## 2019-03-22 DIAGNOSIS — M150 Primary generalized (osteo)arthritis: Secondary | ICD-10-CM | POA: Diagnosis not present

## 2019-03-22 DIAGNOSIS — C3411 Malignant neoplasm of upper lobe, right bronchus or lung: Secondary | ICD-10-CM | POA: Diagnosis not present

## 2019-03-22 DIAGNOSIS — R296 Repeated falls: Secondary | ICD-10-CM | POA: Diagnosis not present

## 2019-03-22 DIAGNOSIS — I120 Hypertensive chronic kidney disease with stage 5 chronic kidney disease or end stage renal disease: Secondary | ICD-10-CM | POA: Diagnosis not present

## 2019-03-23 DIAGNOSIS — Z23 Encounter for immunization: Secondary | ICD-10-CM | POA: Diagnosis not present

## 2019-03-23 DIAGNOSIS — Z992 Dependence on renal dialysis: Secondary | ICD-10-CM | POA: Diagnosis not present

## 2019-03-23 DIAGNOSIS — D509 Iron deficiency anemia, unspecified: Secondary | ICD-10-CM | POA: Diagnosis not present

## 2019-03-23 DIAGNOSIS — N186 End stage renal disease: Secondary | ICD-10-CM | POA: Diagnosis not present

## 2019-03-23 DIAGNOSIS — N2581 Secondary hyperparathyroidism of renal origin: Secondary | ICD-10-CM | POA: Diagnosis not present

## 2019-03-23 DIAGNOSIS — D631 Anemia in chronic kidney disease: Secondary | ICD-10-CM | POA: Diagnosis not present

## 2019-03-26 DIAGNOSIS — D631 Anemia in chronic kidney disease: Secondary | ICD-10-CM | POA: Diagnosis not present

## 2019-03-26 DIAGNOSIS — C3411 Malignant neoplasm of upper lobe, right bronchus or lung: Secondary | ICD-10-CM | POA: Diagnosis not present

## 2019-03-26 DIAGNOSIS — N2581 Secondary hyperparathyroidism of renal origin: Secondary | ICD-10-CM | POA: Diagnosis not present

## 2019-03-26 DIAGNOSIS — I120 Hypertensive chronic kidney disease with stage 5 chronic kidney disease or end stage renal disease: Secondary | ICD-10-CM | POA: Diagnosis not present

## 2019-03-26 DIAGNOSIS — R296 Repeated falls: Secondary | ICD-10-CM | POA: Diagnosis not present

## 2019-03-26 DIAGNOSIS — M150 Primary generalized (osteo)arthritis: Secondary | ICD-10-CM | POA: Diagnosis not present

## 2019-03-26 DIAGNOSIS — C649 Malignant neoplasm of unspecified kidney, except renal pelvis: Secondary | ICD-10-CM | POA: Diagnosis not present

## 2019-03-26 DIAGNOSIS — N186 End stage renal disease: Secondary | ICD-10-CM | POA: Diagnosis not present

## 2019-03-26 DIAGNOSIS — Z992 Dependence on renal dialysis: Secondary | ICD-10-CM | POA: Diagnosis not present

## 2019-03-26 DIAGNOSIS — D509 Iron deficiency anemia, unspecified: Secondary | ICD-10-CM | POA: Diagnosis not present

## 2019-03-26 DIAGNOSIS — Z23 Encounter for immunization: Secondary | ICD-10-CM | POA: Diagnosis not present

## 2019-03-28 DIAGNOSIS — Z992 Dependence on renal dialysis: Secondary | ICD-10-CM | POA: Diagnosis not present

## 2019-03-28 DIAGNOSIS — D509 Iron deficiency anemia, unspecified: Secondary | ICD-10-CM | POA: Diagnosis not present

## 2019-03-28 DIAGNOSIS — N186 End stage renal disease: Secondary | ICD-10-CM | POA: Diagnosis not present

## 2019-03-28 DIAGNOSIS — Z23 Encounter for immunization: Secondary | ICD-10-CM | POA: Diagnosis not present

## 2019-03-28 DIAGNOSIS — N2581 Secondary hyperparathyroidism of renal origin: Secondary | ICD-10-CM | POA: Diagnosis not present

## 2019-03-28 DIAGNOSIS — D631 Anemia in chronic kidney disease: Secondary | ICD-10-CM | POA: Diagnosis not present

## 2019-03-30 DIAGNOSIS — Z23 Encounter for immunization: Secondary | ICD-10-CM | POA: Diagnosis not present

## 2019-03-30 DIAGNOSIS — D631 Anemia in chronic kidney disease: Secondary | ICD-10-CM | POA: Diagnosis not present

## 2019-03-30 DIAGNOSIS — C649 Malignant neoplasm of unspecified kidney, except renal pelvis: Secondary | ICD-10-CM | POA: Diagnosis not present

## 2019-03-30 DIAGNOSIS — M150 Primary generalized (osteo)arthritis: Secondary | ICD-10-CM | POA: Diagnosis not present

## 2019-03-30 DIAGNOSIS — N186 End stage renal disease: Secondary | ICD-10-CM | POA: Diagnosis not present

## 2019-03-30 DIAGNOSIS — C3411 Malignant neoplasm of upper lobe, right bronchus or lung: Secondary | ICD-10-CM | POA: Diagnosis not present

## 2019-03-30 DIAGNOSIS — Z992 Dependence on renal dialysis: Secondary | ICD-10-CM | POA: Diagnosis not present

## 2019-03-30 DIAGNOSIS — D509 Iron deficiency anemia, unspecified: Secondary | ICD-10-CM | POA: Diagnosis not present

## 2019-03-30 DIAGNOSIS — N2581 Secondary hyperparathyroidism of renal origin: Secondary | ICD-10-CM | POA: Diagnosis not present

## 2019-03-30 DIAGNOSIS — R296 Repeated falls: Secondary | ICD-10-CM | POA: Diagnosis not present

## 2019-03-30 DIAGNOSIS — I120 Hypertensive chronic kidney disease with stage 5 chronic kidney disease or end stage renal disease: Secondary | ICD-10-CM | POA: Diagnosis not present

## 2019-04-02 DIAGNOSIS — D631 Anemia in chronic kidney disease: Secondary | ICD-10-CM | POA: Diagnosis not present

## 2019-04-02 DIAGNOSIS — N186 End stage renal disease: Secondary | ICD-10-CM | POA: Diagnosis not present

## 2019-04-02 DIAGNOSIS — Z23 Encounter for immunization: Secondary | ICD-10-CM | POA: Diagnosis not present

## 2019-04-02 DIAGNOSIS — N2581 Secondary hyperparathyroidism of renal origin: Secondary | ICD-10-CM | POA: Diagnosis not present

## 2019-04-02 DIAGNOSIS — Z992 Dependence on renal dialysis: Secondary | ICD-10-CM | POA: Diagnosis not present

## 2019-04-02 DIAGNOSIS — D509 Iron deficiency anemia, unspecified: Secondary | ICD-10-CM | POA: Diagnosis not present

## 2019-04-04 DIAGNOSIS — Z23 Encounter for immunization: Secondary | ICD-10-CM | POA: Diagnosis not present

## 2019-04-04 DIAGNOSIS — D631 Anemia in chronic kidney disease: Secondary | ICD-10-CM | POA: Diagnosis not present

## 2019-04-04 DIAGNOSIS — N2581 Secondary hyperparathyroidism of renal origin: Secondary | ICD-10-CM | POA: Diagnosis not present

## 2019-04-04 DIAGNOSIS — N186 End stage renal disease: Secondary | ICD-10-CM | POA: Diagnosis not present

## 2019-04-04 DIAGNOSIS — Z992 Dependence on renal dialysis: Secondary | ICD-10-CM | POA: Diagnosis not present

## 2019-04-04 DIAGNOSIS — D509 Iron deficiency anemia, unspecified: Secondary | ICD-10-CM | POA: Diagnosis not present

## 2019-04-07 DIAGNOSIS — Z992 Dependence on renal dialysis: Secondary | ICD-10-CM | POA: Diagnosis not present

## 2019-04-07 DIAGNOSIS — Z23 Encounter for immunization: Secondary | ICD-10-CM | POA: Diagnosis not present

## 2019-04-07 DIAGNOSIS — N2581 Secondary hyperparathyroidism of renal origin: Secondary | ICD-10-CM | POA: Diagnosis not present

## 2019-04-07 DIAGNOSIS — D631 Anemia in chronic kidney disease: Secondary | ICD-10-CM | POA: Diagnosis not present

## 2019-04-07 DIAGNOSIS — D509 Iron deficiency anemia, unspecified: Secondary | ICD-10-CM | POA: Diagnosis not present

## 2019-04-07 DIAGNOSIS — N186 End stage renal disease: Secondary | ICD-10-CM | POA: Diagnosis not present

## 2019-04-09 DIAGNOSIS — Z23 Encounter for immunization: Secondary | ICD-10-CM | POA: Diagnosis not present

## 2019-04-09 DIAGNOSIS — N2581 Secondary hyperparathyroidism of renal origin: Secondary | ICD-10-CM | POA: Diagnosis not present

## 2019-04-09 DIAGNOSIS — N186 End stage renal disease: Secondary | ICD-10-CM | POA: Diagnosis not present

## 2019-04-09 DIAGNOSIS — Z992 Dependence on renal dialysis: Secondary | ICD-10-CM | POA: Diagnosis not present

## 2019-04-09 DIAGNOSIS — D509 Iron deficiency anemia, unspecified: Secondary | ICD-10-CM | POA: Diagnosis not present

## 2019-04-09 DIAGNOSIS — D631 Anemia in chronic kidney disease: Secondary | ICD-10-CM | POA: Diagnosis not present

## 2019-04-10 DIAGNOSIS — Z992 Dependence on renal dialysis: Secondary | ICD-10-CM | POA: Diagnosis not present

## 2019-04-10 DIAGNOSIS — N2581 Secondary hyperparathyroidism of renal origin: Secondary | ICD-10-CM | POA: Diagnosis not present

## 2019-04-10 DIAGNOSIS — D509 Iron deficiency anemia, unspecified: Secondary | ICD-10-CM | POA: Diagnosis not present

## 2019-04-10 DIAGNOSIS — N186 End stage renal disease: Secondary | ICD-10-CM | POA: Diagnosis not present

## 2019-04-10 DIAGNOSIS — D631 Anemia in chronic kidney disease: Secondary | ICD-10-CM | POA: Diagnosis not present

## 2019-04-11 DIAGNOSIS — N2581 Secondary hyperparathyroidism of renal origin: Secondary | ICD-10-CM | POA: Diagnosis not present

## 2019-04-11 DIAGNOSIS — N186 End stage renal disease: Secondary | ICD-10-CM | POA: Diagnosis not present

## 2019-04-11 DIAGNOSIS — D509 Iron deficiency anemia, unspecified: Secondary | ICD-10-CM | POA: Diagnosis not present

## 2019-04-11 DIAGNOSIS — Z992 Dependence on renal dialysis: Secondary | ICD-10-CM | POA: Diagnosis not present

## 2019-04-11 DIAGNOSIS — D631 Anemia in chronic kidney disease: Secondary | ICD-10-CM | POA: Diagnosis not present

## 2019-04-13 DIAGNOSIS — N186 End stage renal disease: Secondary | ICD-10-CM | POA: Diagnosis not present

## 2019-04-13 DIAGNOSIS — D509 Iron deficiency anemia, unspecified: Secondary | ICD-10-CM | POA: Diagnosis not present

## 2019-04-13 DIAGNOSIS — N2581 Secondary hyperparathyroidism of renal origin: Secondary | ICD-10-CM | POA: Diagnosis not present

## 2019-04-13 DIAGNOSIS — Z992 Dependence on renal dialysis: Secondary | ICD-10-CM | POA: Diagnosis not present

## 2019-04-13 DIAGNOSIS — D631 Anemia in chronic kidney disease: Secondary | ICD-10-CM | POA: Diagnosis not present

## 2019-04-15 DIAGNOSIS — D509 Iron deficiency anemia, unspecified: Secondary | ICD-10-CM | POA: Diagnosis not present

## 2019-04-15 DIAGNOSIS — Z992 Dependence on renal dialysis: Secondary | ICD-10-CM | POA: Diagnosis not present

## 2019-04-15 DIAGNOSIS — N186 End stage renal disease: Secondary | ICD-10-CM | POA: Diagnosis not present

## 2019-04-15 DIAGNOSIS — D631 Anemia in chronic kidney disease: Secondary | ICD-10-CM | POA: Diagnosis not present

## 2019-04-15 DIAGNOSIS — N2581 Secondary hyperparathyroidism of renal origin: Secondary | ICD-10-CM | POA: Diagnosis not present

## 2019-04-16 DIAGNOSIS — N2581 Secondary hyperparathyroidism of renal origin: Secondary | ICD-10-CM | POA: Diagnosis not present

## 2019-04-16 DIAGNOSIS — D631 Anemia in chronic kidney disease: Secondary | ICD-10-CM | POA: Diagnosis not present

## 2019-04-16 DIAGNOSIS — N186 End stage renal disease: Secondary | ICD-10-CM | POA: Diagnosis not present

## 2019-04-16 DIAGNOSIS — Z992 Dependence on renal dialysis: Secondary | ICD-10-CM | POA: Diagnosis not present

## 2019-04-16 DIAGNOSIS — D509 Iron deficiency anemia, unspecified: Secondary | ICD-10-CM | POA: Diagnosis not present

## 2019-04-18 DIAGNOSIS — N2581 Secondary hyperparathyroidism of renal origin: Secondary | ICD-10-CM | POA: Diagnosis not present

## 2019-04-18 DIAGNOSIS — D631 Anemia in chronic kidney disease: Secondary | ICD-10-CM | POA: Diagnosis not present

## 2019-04-18 DIAGNOSIS — N186 End stage renal disease: Secondary | ICD-10-CM | POA: Diagnosis not present

## 2019-04-18 DIAGNOSIS — Z992 Dependence on renal dialysis: Secondary | ICD-10-CM | POA: Diagnosis not present

## 2019-04-18 DIAGNOSIS — D509 Iron deficiency anemia, unspecified: Secondary | ICD-10-CM | POA: Diagnosis not present

## 2019-04-20 DIAGNOSIS — D631 Anemia in chronic kidney disease: Secondary | ICD-10-CM | POA: Diagnosis not present

## 2019-04-20 DIAGNOSIS — N186 End stage renal disease: Secondary | ICD-10-CM | POA: Diagnosis not present

## 2019-04-20 DIAGNOSIS — Z992 Dependence on renal dialysis: Secondary | ICD-10-CM | POA: Diagnosis not present

## 2019-04-20 DIAGNOSIS — D509 Iron deficiency anemia, unspecified: Secondary | ICD-10-CM | POA: Diagnosis not present

## 2019-04-20 DIAGNOSIS — N2581 Secondary hyperparathyroidism of renal origin: Secondary | ICD-10-CM | POA: Diagnosis not present

## 2019-04-23 DIAGNOSIS — N2581 Secondary hyperparathyroidism of renal origin: Secondary | ICD-10-CM | POA: Diagnosis not present

## 2019-04-23 DIAGNOSIS — Z992 Dependence on renal dialysis: Secondary | ICD-10-CM | POA: Diagnosis not present

## 2019-04-23 DIAGNOSIS — D631 Anemia in chronic kidney disease: Secondary | ICD-10-CM | POA: Diagnosis not present

## 2019-04-23 DIAGNOSIS — N186 End stage renal disease: Secondary | ICD-10-CM | POA: Diagnosis not present

## 2019-04-23 DIAGNOSIS — D509 Iron deficiency anemia, unspecified: Secondary | ICD-10-CM | POA: Diagnosis not present

## 2019-04-25 DIAGNOSIS — N186 End stage renal disease: Secondary | ICD-10-CM | POA: Diagnosis not present

## 2019-04-25 DIAGNOSIS — D509 Iron deficiency anemia, unspecified: Secondary | ICD-10-CM | POA: Diagnosis not present

## 2019-04-25 DIAGNOSIS — Z992 Dependence on renal dialysis: Secondary | ICD-10-CM | POA: Diagnosis not present

## 2019-04-25 DIAGNOSIS — N2581 Secondary hyperparathyroidism of renal origin: Secondary | ICD-10-CM | POA: Diagnosis not present

## 2019-04-25 DIAGNOSIS — D631 Anemia in chronic kidney disease: Secondary | ICD-10-CM | POA: Diagnosis not present

## 2019-04-27 DIAGNOSIS — N186 End stage renal disease: Secondary | ICD-10-CM | POA: Diagnosis not present

## 2019-04-27 DIAGNOSIS — N2581 Secondary hyperparathyroidism of renal origin: Secondary | ICD-10-CM | POA: Diagnosis not present

## 2019-04-27 DIAGNOSIS — Z992 Dependence on renal dialysis: Secondary | ICD-10-CM | POA: Diagnosis not present

## 2019-04-27 DIAGNOSIS — D509 Iron deficiency anemia, unspecified: Secondary | ICD-10-CM | POA: Diagnosis not present

## 2019-04-27 DIAGNOSIS — D631 Anemia in chronic kidney disease: Secondary | ICD-10-CM | POA: Diagnosis not present

## 2019-04-30 DIAGNOSIS — D509 Iron deficiency anemia, unspecified: Secondary | ICD-10-CM | POA: Diagnosis not present

## 2019-04-30 DIAGNOSIS — Z992 Dependence on renal dialysis: Secondary | ICD-10-CM | POA: Diagnosis not present

## 2019-04-30 DIAGNOSIS — D631 Anemia in chronic kidney disease: Secondary | ICD-10-CM | POA: Diagnosis not present

## 2019-04-30 DIAGNOSIS — N2581 Secondary hyperparathyroidism of renal origin: Secondary | ICD-10-CM | POA: Diagnosis not present

## 2019-04-30 DIAGNOSIS — N186 End stage renal disease: Secondary | ICD-10-CM | POA: Diagnosis not present

## 2019-05-02 DIAGNOSIS — N2581 Secondary hyperparathyroidism of renal origin: Secondary | ICD-10-CM | POA: Diagnosis not present

## 2019-05-02 DIAGNOSIS — D509 Iron deficiency anemia, unspecified: Secondary | ICD-10-CM | POA: Diagnosis not present

## 2019-05-02 DIAGNOSIS — Z992 Dependence on renal dialysis: Secondary | ICD-10-CM | POA: Diagnosis not present

## 2019-05-02 DIAGNOSIS — D631 Anemia in chronic kidney disease: Secondary | ICD-10-CM | POA: Diagnosis not present

## 2019-05-02 DIAGNOSIS — N186 End stage renal disease: Secondary | ICD-10-CM | POA: Diagnosis not present

## 2019-05-05 DIAGNOSIS — D509 Iron deficiency anemia, unspecified: Secondary | ICD-10-CM | POA: Diagnosis not present

## 2019-05-05 DIAGNOSIS — D631 Anemia in chronic kidney disease: Secondary | ICD-10-CM | POA: Diagnosis not present

## 2019-05-05 DIAGNOSIS — Z992 Dependence on renal dialysis: Secondary | ICD-10-CM | POA: Diagnosis not present

## 2019-05-05 DIAGNOSIS — N2581 Secondary hyperparathyroidism of renal origin: Secondary | ICD-10-CM | POA: Diagnosis not present

## 2019-05-05 DIAGNOSIS — N186 End stage renal disease: Secondary | ICD-10-CM | POA: Diagnosis not present

## 2019-05-07 DIAGNOSIS — N2581 Secondary hyperparathyroidism of renal origin: Secondary | ICD-10-CM | POA: Diagnosis not present

## 2019-05-07 DIAGNOSIS — D509 Iron deficiency anemia, unspecified: Secondary | ICD-10-CM | POA: Diagnosis not present

## 2019-05-07 DIAGNOSIS — D631 Anemia in chronic kidney disease: Secondary | ICD-10-CM | POA: Diagnosis not present

## 2019-05-07 DIAGNOSIS — Z992 Dependence on renal dialysis: Secondary | ICD-10-CM | POA: Diagnosis not present

## 2019-05-07 DIAGNOSIS — N186 End stage renal disease: Secondary | ICD-10-CM | POA: Diagnosis not present

## 2019-05-08 ENCOUNTER — Other Ambulatory Visit: Payer: Self-pay

## 2019-05-08 ENCOUNTER — Ambulatory Visit (HOSPITAL_COMMUNITY)
Admission: RE | Admit: 2019-05-08 | Discharge: 2019-05-08 | Disposition: A | Discharge status: Home / Self Care | Payer: Medicare Other | Source: Ambulatory Visit | Attending: Oncology | Admitting: Oncology

## 2019-05-08 DIAGNOSIS — C3411 Malignant neoplasm of upper lobe, right bronchus or lung: Secondary | ICD-10-CM | POA: Diagnosis present

## 2019-05-09 DIAGNOSIS — D509 Iron deficiency anemia, unspecified: Secondary | ICD-10-CM | POA: Diagnosis not present

## 2019-05-09 DIAGNOSIS — N186 End stage renal disease: Secondary | ICD-10-CM | POA: Diagnosis not present

## 2019-05-09 DIAGNOSIS — Z992 Dependence on renal dialysis: Secondary | ICD-10-CM | POA: Diagnosis not present

## 2019-05-09 DIAGNOSIS — N2581 Secondary hyperparathyroidism of renal origin: Secondary | ICD-10-CM | POA: Diagnosis not present

## 2019-05-09 DIAGNOSIS — D631 Anemia in chronic kidney disease: Secondary | ICD-10-CM | POA: Diagnosis not present

## 2019-05-11 DIAGNOSIS — Z992 Dependence on renal dialysis: Secondary | ICD-10-CM | POA: Diagnosis not present

## 2019-05-11 DIAGNOSIS — D509 Iron deficiency anemia, unspecified: Secondary | ICD-10-CM | POA: Diagnosis not present

## 2019-05-11 DIAGNOSIS — N2581 Secondary hyperparathyroidism of renal origin: Secondary | ICD-10-CM | POA: Diagnosis not present

## 2019-05-11 DIAGNOSIS — N186 End stage renal disease: Secondary | ICD-10-CM | POA: Diagnosis not present

## 2019-05-11 DIAGNOSIS — D631 Anemia in chronic kidney disease: Secondary | ICD-10-CM | POA: Diagnosis not present

## 2019-05-14 DIAGNOSIS — D509 Iron deficiency anemia, unspecified: Secondary | ICD-10-CM | POA: Diagnosis not present

## 2019-05-14 DIAGNOSIS — D631 Anemia in chronic kidney disease: Secondary | ICD-10-CM | POA: Diagnosis not present

## 2019-05-14 DIAGNOSIS — N186 End stage renal disease: Secondary | ICD-10-CM | POA: Diagnosis not present

## 2019-05-14 DIAGNOSIS — N2581 Secondary hyperparathyroidism of renal origin: Secondary | ICD-10-CM | POA: Diagnosis not present

## 2019-05-14 DIAGNOSIS — Z992 Dependence on renal dialysis: Secondary | ICD-10-CM | POA: Diagnosis not present

## 2019-05-15 DIAGNOSIS — C3411 Malignant neoplasm of upper lobe, right bronchus or lung: Secondary | ICD-10-CM | POA: Diagnosis not present

## 2019-05-15 DIAGNOSIS — D509 Iron deficiency anemia, unspecified: Secondary | ICD-10-CM | POA: Diagnosis not present

## 2019-05-15 DIAGNOSIS — D631 Anemia in chronic kidney disease: Secondary | ICD-10-CM | POA: Diagnosis not present

## 2019-05-15 DIAGNOSIS — Z992 Dependence on renal dialysis: Secondary | ICD-10-CM | POA: Diagnosis not present

## 2019-05-15 DIAGNOSIS — N2581 Secondary hyperparathyroidism of renal origin: Secondary | ICD-10-CM | POA: Diagnosis not present

## 2019-05-15 DIAGNOSIS — N186 End stage renal disease: Secondary | ICD-10-CM | POA: Diagnosis not present

## 2019-05-16 DIAGNOSIS — D509 Iron deficiency anemia, unspecified: Secondary | ICD-10-CM | POA: Diagnosis not present

## 2019-05-16 DIAGNOSIS — Z992 Dependence on renal dialysis: Secondary | ICD-10-CM | POA: Diagnosis not present

## 2019-05-16 DIAGNOSIS — N2581 Secondary hyperparathyroidism of renal origin: Secondary | ICD-10-CM | POA: Diagnosis not present

## 2019-05-16 DIAGNOSIS — N186 End stage renal disease: Secondary | ICD-10-CM | POA: Diagnosis not present

## 2019-05-16 DIAGNOSIS — D631 Anemia in chronic kidney disease: Secondary | ICD-10-CM | POA: Diagnosis not present

## 2019-05-17 DIAGNOSIS — H33311 Horseshoe tear of retina without detachment, right eye: Secondary | ICD-10-CM | POA: Diagnosis not present

## 2019-05-18 DIAGNOSIS — D509 Iron deficiency anemia, unspecified: Secondary | ICD-10-CM | POA: Diagnosis not present

## 2019-05-18 DIAGNOSIS — N2581 Secondary hyperparathyroidism of renal origin: Secondary | ICD-10-CM | POA: Diagnosis not present

## 2019-05-18 DIAGNOSIS — N186 End stage renal disease: Secondary | ICD-10-CM | POA: Diagnosis not present

## 2019-05-18 DIAGNOSIS — D631 Anemia in chronic kidney disease: Secondary | ICD-10-CM | POA: Diagnosis not present

## 2019-05-18 DIAGNOSIS — Z992 Dependence on renal dialysis: Secondary | ICD-10-CM | POA: Diagnosis not present

## 2019-05-21 DIAGNOSIS — D509 Iron deficiency anemia, unspecified: Secondary | ICD-10-CM | POA: Diagnosis not present

## 2019-05-21 DIAGNOSIS — N186 End stage renal disease: Secondary | ICD-10-CM | POA: Diagnosis not present

## 2019-05-21 DIAGNOSIS — N2581 Secondary hyperparathyroidism of renal origin: Secondary | ICD-10-CM | POA: Diagnosis not present

## 2019-05-21 DIAGNOSIS — D631 Anemia in chronic kidney disease: Secondary | ICD-10-CM | POA: Diagnosis not present

## 2019-05-21 DIAGNOSIS — Z992 Dependence on renal dialysis: Secondary | ICD-10-CM | POA: Diagnosis not present

## 2019-05-23 DIAGNOSIS — N2581 Secondary hyperparathyroidism of renal origin: Secondary | ICD-10-CM | POA: Diagnosis not present

## 2019-05-23 DIAGNOSIS — D631 Anemia in chronic kidney disease: Secondary | ICD-10-CM | POA: Diagnosis not present

## 2019-05-23 DIAGNOSIS — N186 End stage renal disease: Secondary | ICD-10-CM | POA: Diagnosis not present

## 2019-05-23 DIAGNOSIS — D509 Iron deficiency anemia, unspecified: Secondary | ICD-10-CM | POA: Diagnosis not present

## 2019-05-23 DIAGNOSIS — Z992 Dependence on renal dialysis: Secondary | ICD-10-CM | POA: Diagnosis not present

## 2019-05-25 DIAGNOSIS — N186 End stage renal disease: Secondary | ICD-10-CM | POA: Diagnosis not present

## 2019-05-25 DIAGNOSIS — Z992 Dependence on renal dialysis: Secondary | ICD-10-CM | POA: Diagnosis not present

## 2019-05-25 DIAGNOSIS — D509 Iron deficiency anemia, unspecified: Secondary | ICD-10-CM | POA: Diagnosis not present

## 2019-05-25 DIAGNOSIS — D631 Anemia in chronic kidney disease: Secondary | ICD-10-CM | POA: Diagnosis not present

## 2019-05-25 DIAGNOSIS — N2581 Secondary hyperparathyroidism of renal origin: Secondary | ICD-10-CM | POA: Diagnosis not present

## 2019-05-28 DIAGNOSIS — D631 Anemia in chronic kidney disease: Secondary | ICD-10-CM | POA: Diagnosis not present

## 2019-05-28 DIAGNOSIS — N2581 Secondary hyperparathyroidism of renal origin: Secondary | ICD-10-CM | POA: Diagnosis not present

## 2019-05-28 DIAGNOSIS — Z992 Dependence on renal dialysis: Secondary | ICD-10-CM | POA: Diagnosis not present

## 2019-05-28 DIAGNOSIS — N186 End stage renal disease: Secondary | ICD-10-CM | POA: Diagnosis not present

## 2019-05-28 DIAGNOSIS — D509 Iron deficiency anemia, unspecified: Secondary | ICD-10-CM | POA: Diagnosis not present

## 2019-05-30 DIAGNOSIS — D631 Anemia in chronic kidney disease: Secondary | ICD-10-CM | POA: Diagnosis not present

## 2019-05-30 DIAGNOSIS — N186 End stage renal disease: Secondary | ICD-10-CM | POA: Diagnosis not present

## 2019-05-30 DIAGNOSIS — N2581 Secondary hyperparathyroidism of renal origin: Secondary | ICD-10-CM | POA: Diagnosis not present

## 2019-05-30 DIAGNOSIS — D509 Iron deficiency anemia, unspecified: Secondary | ICD-10-CM | POA: Diagnosis not present

## 2019-05-30 DIAGNOSIS — Z992 Dependence on renal dialysis: Secondary | ICD-10-CM | POA: Diagnosis not present

## 2019-06-01 DIAGNOSIS — N186 End stage renal disease: Secondary | ICD-10-CM | POA: Diagnosis not present

## 2019-06-01 DIAGNOSIS — N2581 Secondary hyperparathyroidism of renal origin: Secondary | ICD-10-CM | POA: Diagnosis not present

## 2019-06-01 DIAGNOSIS — D631 Anemia in chronic kidney disease: Secondary | ICD-10-CM | POA: Diagnosis not present

## 2019-06-01 DIAGNOSIS — Z992 Dependence on renal dialysis: Secondary | ICD-10-CM | POA: Diagnosis not present

## 2019-06-01 DIAGNOSIS — D509 Iron deficiency anemia, unspecified: Secondary | ICD-10-CM | POA: Diagnosis not present

## 2019-06-04 DIAGNOSIS — D631 Anemia in chronic kidney disease: Secondary | ICD-10-CM | POA: Diagnosis not present

## 2019-06-04 DIAGNOSIS — N2581 Secondary hyperparathyroidism of renal origin: Secondary | ICD-10-CM | POA: Diagnosis not present

## 2019-06-04 DIAGNOSIS — Z992 Dependence on renal dialysis: Secondary | ICD-10-CM | POA: Diagnosis not present

## 2019-06-04 DIAGNOSIS — D509 Iron deficiency anemia, unspecified: Secondary | ICD-10-CM | POA: Diagnosis not present

## 2019-06-04 DIAGNOSIS — N186 End stage renal disease: Secondary | ICD-10-CM | POA: Diagnosis not present

## 2019-06-06 DIAGNOSIS — N186 End stage renal disease: Secondary | ICD-10-CM | POA: Diagnosis not present

## 2019-06-06 DIAGNOSIS — N2581 Secondary hyperparathyroidism of renal origin: Secondary | ICD-10-CM | POA: Diagnosis not present

## 2019-06-06 DIAGNOSIS — D509 Iron deficiency anemia, unspecified: Secondary | ICD-10-CM | POA: Diagnosis not present

## 2019-06-06 DIAGNOSIS — Z992 Dependence on renal dialysis: Secondary | ICD-10-CM | POA: Diagnosis not present

## 2019-06-06 DIAGNOSIS — D631 Anemia in chronic kidney disease: Secondary | ICD-10-CM | POA: Diagnosis not present

## 2019-06-08 DIAGNOSIS — D509 Iron deficiency anemia, unspecified: Secondary | ICD-10-CM | POA: Diagnosis not present

## 2019-06-08 DIAGNOSIS — N186 End stage renal disease: Secondary | ICD-10-CM | POA: Diagnosis not present

## 2019-06-08 DIAGNOSIS — N2581 Secondary hyperparathyroidism of renal origin: Secondary | ICD-10-CM | POA: Diagnosis not present

## 2019-06-08 DIAGNOSIS — Z992 Dependence on renal dialysis: Secondary | ICD-10-CM | POA: Diagnosis not present

## 2019-06-08 DIAGNOSIS — D631 Anemia in chronic kidney disease: Secondary | ICD-10-CM | POA: Diagnosis not present

## 2019-06-10 DIAGNOSIS — N186 End stage renal disease: Secondary | ICD-10-CM | POA: Diagnosis not present

## 2019-06-10 DIAGNOSIS — Z992 Dependence on renal dialysis: Secondary | ICD-10-CM | POA: Diagnosis not present

## 2019-06-11 DIAGNOSIS — Z992 Dependence on renal dialysis: Secondary | ICD-10-CM | POA: Diagnosis not present

## 2019-06-11 DIAGNOSIS — N2581 Secondary hyperparathyroidism of renal origin: Secondary | ICD-10-CM | POA: Diagnosis not present

## 2019-06-11 DIAGNOSIS — D509 Iron deficiency anemia, unspecified: Secondary | ICD-10-CM | POA: Diagnosis not present

## 2019-06-11 DIAGNOSIS — D631 Anemia in chronic kidney disease: Secondary | ICD-10-CM | POA: Diagnosis not present

## 2019-06-11 DIAGNOSIS — N186 End stage renal disease: Secondary | ICD-10-CM | POA: Diagnosis not present

## 2019-06-13 DIAGNOSIS — D631 Anemia in chronic kidney disease: Secondary | ICD-10-CM | POA: Diagnosis not present

## 2019-06-13 DIAGNOSIS — Z992 Dependence on renal dialysis: Secondary | ICD-10-CM | POA: Diagnosis not present

## 2019-06-13 DIAGNOSIS — N2581 Secondary hyperparathyroidism of renal origin: Secondary | ICD-10-CM | POA: Diagnosis not present

## 2019-06-13 DIAGNOSIS — N186 End stage renal disease: Secondary | ICD-10-CM | POA: Diagnosis not present

## 2019-06-13 DIAGNOSIS — D509 Iron deficiency anemia, unspecified: Secondary | ICD-10-CM | POA: Diagnosis not present

## 2019-06-14 DIAGNOSIS — D631 Anemia in chronic kidney disease: Secondary | ICD-10-CM | POA: Diagnosis not present

## 2019-06-14 DIAGNOSIS — Z992 Dependence on renal dialysis: Secondary | ICD-10-CM | POA: Diagnosis not present

## 2019-06-14 DIAGNOSIS — D509 Iron deficiency anemia, unspecified: Secondary | ICD-10-CM | POA: Diagnosis not present

## 2019-06-14 DIAGNOSIS — N186 End stage renal disease: Secondary | ICD-10-CM | POA: Diagnosis not present

## 2019-06-14 DIAGNOSIS — N2581 Secondary hyperparathyroidism of renal origin: Secondary | ICD-10-CM | POA: Diagnosis not present

## 2019-06-15 DIAGNOSIS — Z992 Dependence on renal dialysis: Secondary | ICD-10-CM | POA: Diagnosis not present

## 2019-06-15 DIAGNOSIS — D631 Anemia in chronic kidney disease: Secondary | ICD-10-CM | POA: Diagnosis not present

## 2019-06-15 DIAGNOSIS — D509 Iron deficiency anemia, unspecified: Secondary | ICD-10-CM | POA: Diagnosis not present

## 2019-06-15 DIAGNOSIS — N186 End stage renal disease: Secondary | ICD-10-CM | POA: Diagnosis not present

## 2019-06-15 DIAGNOSIS — N2581 Secondary hyperparathyroidism of renal origin: Secondary | ICD-10-CM | POA: Diagnosis not present

## 2019-06-18 DIAGNOSIS — Z992 Dependence on renal dialysis: Secondary | ICD-10-CM | POA: Diagnosis not present

## 2019-06-18 DIAGNOSIS — N186 End stage renal disease: Secondary | ICD-10-CM | POA: Diagnosis not present

## 2019-06-18 DIAGNOSIS — D631 Anemia in chronic kidney disease: Secondary | ICD-10-CM | POA: Diagnosis not present

## 2019-06-18 DIAGNOSIS — N2581 Secondary hyperparathyroidism of renal origin: Secondary | ICD-10-CM | POA: Diagnosis not present

## 2019-06-18 DIAGNOSIS — D509 Iron deficiency anemia, unspecified: Secondary | ICD-10-CM | POA: Diagnosis not present

## 2019-06-20 DIAGNOSIS — N2581 Secondary hyperparathyroidism of renal origin: Secondary | ICD-10-CM | POA: Diagnosis not present

## 2019-06-20 DIAGNOSIS — D509 Iron deficiency anemia, unspecified: Secondary | ICD-10-CM | POA: Diagnosis not present

## 2019-06-20 DIAGNOSIS — N186 End stage renal disease: Secondary | ICD-10-CM | POA: Diagnosis not present

## 2019-06-20 DIAGNOSIS — Z992 Dependence on renal dialysis: Secondary | ICD-10-CM | POA: Diagnosis not present

## 2019-06-20 DIAGNOSIS — D631 Anemia in chronic kidney disease: Secondary | ICD-10-CM | POA: Diagnosis not present

## 2019-06-22 DIAGNOSIS — N186 End stage renal disease: Secondary | ICD-10-CM | POA: Diagnosis not present

## 2019-06-22 DIAGNOSIS — D509 Iron deficiency anemia, unspecified: Secondary | ICD-10-CM | POA: Diagnosis not present

## 2019-06-22 DIAGNOSIS — N2581 Secondary hyperparathyroidism of renal origin: Secondary | ICD-10-CM | POA: Diagnosis not present

## 2019-06-22 DIAGNOSIS — D631 Anemia in chronic kidney disease: Secondary | ICD-10-CM | POA: Diagnosis not present

## 2019-06-22 DIAGNOSIS — Z992 Dependence on renal dialysis: Secondary | ICD-10-CM | POA: Diagnosis not present

## 2019-06-25 DIAGNOSIS — N2581 Secondary hyperparathyroidism of renal origin: Secondary | ICD-10-CM | POA: Diagnosis not present

## 2019-06-25 DIAGNOSIS — Z992 Dependence on renal dialysis: Secondary | ICD-10-CM | POA: Diagnosis not present

## 2019-06-25 DIAGNOSIS — D631 Anemia in chronic kidney disease: Secondary | ICD-10-CM | POA: Diagnosis not present

## 2019-06-25 DIAGNOSIS — D509 Iron deficiency anemia, unspecified: Secondary | ICD-10-CM | POA: Diagnosis not present

## 2019-06-25 DIAGNOSIS — N186 End stage renal disease: Secondary | ICD-10-CM | POA: Diagnosis not present

## 2019-06-27 DIAGNOSIS — Z992 Dependence on renal dialysis: Secondary | ICD-10-CM | POA: Diagnosis not present

## 2019-06-27 DIAGNOSIS — N2581 Secondary hyperparathyroidism of renal origin: Secondary | ICD-10-CM | POA: Diagnosis not present

## 2019-06-27 DIAGNOSIS — N186 End stage renal disease: Secondary | ICD-10-CM | POA: Diagnosis not present

## 2019-06-27 DIAGNOSIS — D509 Iron deficiency anemia, unspecified: Secondary | ICD-10-CM | POA: Diagnosis not present

## 2019-06-27 DIAGNOSIS — D631 Anemia in chronic kidney disease: Secondary | ICD-10-CM | POA: Diagnosis not present

## 2019-06-29 DIAGNOSIS — Z992 Dependence on renal dialysis: Secondary | ICD-10-CM | POA: Diagnosis not present

## 2019-06-29 DIAGNOSIS — D509 Iron deficiency anemia, unspecified: Secondary | ICD-10-CM | POA: Diagnosis not present

## 2019-06-29 DIAGNOSIS — D631 Anemia in chronic kidney disease: Secondary | ICD-10-CM | POA: Diagnosis not present

## 2019-06-29 DIAGNOSIS — N2581 Secondary hyperparathyroidism of renal origin: Secondary | ICD-10-CM | POA: Diagnosis not present

## 2019-06-29 DIAGNOSIS — N186 End stage renal disease: Secondary | ICD-10-CM | POA: Diagnosis not present

## 2019-07-02 DIAGNOSIS — N2581 Secondary hyperparathyroidism of renal origin: Secondary | ICD-10-CM | POA: Diagnosis not present

## 2019-07-02 DIAGNOSIS — Z992 Dependence on renal dialysis: Secondary | ICD-10-CM | POA: Diagnosis not present

## 2019-07-02 DIAGNOSIS — D631 Anemia in chronic kidney disease: Secondary | ICD-10-CM | POA: Diagnosis not present

## 2019-07-02 DIAGNOSIS — D509 Iron deficiency anemia, unspecified: Secondary | ICD-10-CM | POA: Diagnosis not present

## 2019-07-02 DIAGNOSIS — N186 End stage renal disease: Secondary | ICD-10-CM | POA: Diagnosis not present

## 2019-07-04 DIAGNOSIS — D509 Iron deficiency anemia, unspecified: Secondary | ICD-10-CM | POA: Diagnosis not present

## 2019-07-04 DIAGNOSIS — N186 End stage renal disease: Secondary | ICD-10-CM | POA: Diagnosis not present

## 2019-07-04 DIAGNOSIS — N2581 Secondary hyperparathyroidism of renal origin: Secondary | ICD-10-CM | POA: Diagnosis not present

## 2019-07-04 DIAGNOSIS — D631 Anemia in chronic kidney disease: Secondary | ICD-10-CM | POA: Diagnosis not present

## 2019-07-04 DIAGNOSIS — Z992 Dependence on renal dialysis: Secondary | ICD-10-CM | POA: Diagnosis not present

## 2019-07-05 DIAGNOSIS — Z23 Encounter for immunization: Secondary | ICD-10-CM | POA: Diagnosis not present

## 2019-07-06 DIAGNOSIS — N186 End stage renal disease: Secondary | ICD-10-CM | POA: Diagnosis not present

## 2019-07-06 DIAGNOSIS — N2581 Secondary hyperparathyroidism of renal origin: Secondary | ICD-10-CM | POA: Diagnosis not present

## 2019-07-06 DIAGNOSIS — Z992 Dependence on renal dialysis: Secondary | ICD-10-CM | POA: Diagnosis not present

## 2019-07-06 DIAGNOSIS — D509 Iron deficiency anemia, unspecified: Secondary | ICD-10-CM | POA: Diagnosis not present

## 2019-07-06 DIAGNOSIS — D631 Anemia in chronic kidney disease: Secondary | ICD-10-CM | POA: Diagnosis not present

## 2019-07-08 DIAGNOSIS — Z992 Dependence on renal dialysis: Secondary | ICD-10-CM | POA: Diagnosis not present

## 2019-07-08 DIAGNOSIS — N186 End stage renal disease: Secondary | ICD-10-CM | POA: Diagnosis not present

## 2019-07-09 DIAGNOSIS — D509 Iron deficiency anemia, unspecified: Secondary | ICD-10-CM | POA: Diagnosis not present

## 2019-07-09 DIAGNOSIS — N186 End stage renal disease: Secondary | ICD-10-CM | POA: Diagnosis not present

## 2019-07-09 DIAGNOSIS — N2581 Secondary hyperparathyroidism of renal origin: Secondary | ICD-10-CM | POA: Diagnosis not present

## 2019-07-09 DIAGNOSIS — D631 Anemia in chronic kidney disease: Secondary | ICD-10-CM | POA: Diagnosis not present

## 2019-07-09 DIAGNOSIS — Z992 Dependence on renal dialysis: Secondary | ICD-10-CM | POA: Diagnosis not present

## 2019-07-11 DIAGNOSIS — N186 End stage renal disease: Secondary | ICD-10-CM | POA: Diagnosis not present

## 2019-07-11 DIAGNOSIS — D509 Iron deficiency anemia, unspecified: Secondary | ICD-10-CM | POA: Diagnosis not present

## 2019-07-11 DIAGNOSIS — Z992 Dependence on renal dialysis: Secondary | ICD-10-CM | POA: Diagnosis not present

## 2019-07-11 DIAGNOSIS — N2581 Secondary hyperparathyroidism of renal origin: Secondary | ICD-10-CM | POA: Diagnosis not present

## 2019-07-11 DIAGNOSIS — D631 Anemia in chronic kidney disease: Secondary | ICD-10-CM | POA: Diagnosis not present

## 2019-07-13 DIAGNOSIS — D509 Iron deficiency anemia, unspecified: Secondary | ICD-10-CM | POA: Diagnosis not present

## 2019-07-13 DIAGNOSIS — Z992 Dependence on renal dialysis: Secondary | ICD-10-CM | POA: Diagnosis not present

## 2019-07-13 DIAGNOSIS — D631 Anemia in chronic kidney disease: Secondary | ICD-10-CM | POA: Diagnosis not present

## 2019-07-13 DIAGNOSIS — N186 End stage renal disease: Secondary | ICD-10-CM | POA: Diagnosis not present

## 2019-07-13 DIAGNOSIS — N2581 Secondary hyperparathyroidism of renal origin: Secondary | ICD-10-CM | POA: Diagnosis not present

## 2019-07-14 DIAGNOSIS — D509 Iron deficiency anemia, unspecified: Secondary | ICD-10-CM | POA: Diagnosis not present

## 2019-07-14 DIAGNOSIS — D631 Anemia in chronic kidney disease: Secondary | ICD-10-CM | POA: Diagnosis not present

## 2019-07-14 DIAGNOSIS — Z992 Dependence on renal dialysis: Secondary | ICD-10-CM | POA: Diagnosis not present

## 2019-07-14 DIAGNOSIS — N186 End stage renal disease: Secondary | ICD-10-CM | POA: Diagnosis not present

## 2019-07-14 DIAGNOSIS — N2581 Secondary hyperparathyroidism of renal origin: Secondary | ICD-10-CM | POA: Diagnosis not present

## 2019-07-16 DIAGNOSIS — D509 Iron deficiency anemia, unspecified: Secondary | ICD-10-CM | POA: Diagnosis not present

## 2019-07-16 DIAGNOSIS — D631 Anemia in chronic kidney disease: Secondary | ICD-10-CM | POA: Diagnosis not present

## 2019-07-16 DIAGNOSIS — N186 End stage renal disease: Secondary | ICD-10-CM | POA: Diagnosis not present

## 2019-07-16 DIAGNOSIS — N2581 Secondary hyperparathyroidism of renal origin: Secondary | ICD-10-CM | POA: Diagnosis not present

## 2019-07-16 DIAGNOSIS — Z992 Dependence on renal dialysis: Secondary | ICD-10-CM | POA: Diagnosis not present

## 2019-07-18 DIAGNOSIS — N2581 Secondary hyperparathyroidism of renal origin: Secondary | ICD-10-CM | POA: Diagnosis not present

## 2019-07-18 DIAGNOSIS — Z992 Dependence on renal dialysis: Secondary | ICD-10-CM | POA: Diagnosis not present

## 2019-07-18 DIAGNOSIS — D631 Anemia in chronic kidney disease: Secondary | ICD-10-CM | POA: Diagnosis not present

## 2019-07-18 DIAGNOSIS — D509 Iron deficiency anemia, unspecified: Secondary | ICD-10-CM | POA: Diagnosis not present

## 2019-07-18 DIAGNOSIS — N186 End stage renal disease: Secondary | ICD-10-CM | POA: Diagnosis not present

## 2019-07-20 DIAGNOSIS — Z992 Dependence on renal dialysis: Secondary | ICD-10-CM | POA: Diagnosis not present

## 2019-07-20 DIAGNOSIS — N186 End stage renal disease: Secondary | ICD-10-CM | POA: Diagnosis not present

## 2019-07-20 DIAGNOSIS — D509 Iron deficiency anemia, unspecified: Secondary | ICD-10-CM | POA: Diagnosis not present

## 2019-07-20 DIAGNOSIS — N2581 Secondary hyperparathyroidism of renal origin: Secondary | ICD-10-CM | POA: Diagnosis not present

## 2019-07-20 DIAGNOSIS — D631 Anemia in chronic kidney disease: Secondary | ICD-10-CM | POA: Diagnosis not present

## 2019-07-23 DIAGNOSIS — Z992 Dependence on renal dialysis: Secondary | ICD-10-CM | POA: Diagnosis not present

## 2019-07-23 DIAGNOSIS — N186 End stage renal disease: Secondary | ICD-10-CM | POA: Diagnosis not present

## 2019-07-23 DIAGNOSIS — N2581 Secondary hyperparathyroidism of renal origin: Secondary | ICD-10-CM | POA: Diagnosis not present

## 2019-07-23 DIAGNOSIS — D509 Iron deficiency anemia, unspecified: Secondary | ICD-10-CM | POA: Diagnosis not present

## 2019-07-23 DIAGNOSIS — D631 Anemia in chronic kidney disease: Secondary | ICD-10-CM | POA: Diagnosis not present

## 2019-07-25 DIAGNOSIS — D631 Anemia in chronic kidney disease: Secondary | ICD-10-CM | POA: Diagnosis not present

## 2019-07-25 DIAGNOSIS — N2581 Secondary hyperparathyroidism of renal origin: Secondary | ICD-10-CM | POA: Diagnosis not present

## 2019-07-25 DIAGNOSIS — D509 Iron deficiency anemia, unspecified: Secondary | ICD-10-CM | POA: Diagnosis not present

## 2019-07-25 DIAGNOSIS — Z992 Dependence on renal dialysis: Secondary | ICD-10-CM | POA: Diagnosis not present

## 2019-07-25 DIAGNOSIS — N186 End stage renal disease: Secondary | ICD-10-CM | POA: Diagnosis not present

## 2019-07-27 DIAGNOSIS — N2581 Secondary hyperparathyroidism of renal origin: Secondary | ICD-10-CM | POA: Diagnosis not present

## 2019-07-27 DIAGNOSIS — D509 Iron deficiency anemia, unspecified: Secondary | ICD-10-CM | POA: Diagnosis not present

## 2019-07-27 DIAGNOSIS — Z992 Dependence on renal dialysis: Secondary | ICD-10-CM | POA: Diagnosis not present

## 2019-07-27 DIAGNOSIS — D631 Anemia in chronic kidney disease: Secondary | ICD-10-CM | POA: Diagnosis not present

## 2019-07-27 DIAGNOSIS — N186 End stage renal disease: Secondary | ICD-10-CM | POA: Diagnosis not present

## 2019-07-30 DIAGNOSIS — Z992 Dependence on renal dialysis: Secondary | ICD-10-CM | POA: Diagnosis not present

## 2019-07-30 DIAGNOSIS — D509 Iron deficiency anemia, unspecified: Secondary | ICD-10-CM | POA: Diagnosis not present

## 2019-07-30 DIAGNOSIS — N2581 Secondary hyperparathyroidism of renal origin: Secondary | ICD-10-CM | POA: Diagnosis not present

## 2019-07-30 DIAGNOSIS — D631 Anemia in chronic kidney disease: Secondary | ICD-10-CM | POA: Diagnosis not present

## 2019-07-30 DIAGNOSIS — N186 End stage renal disease: Secondary | ICD-10-CM | POA: Diagnosis not present

## 2019-08-01 DIAGNOSIS — N2581 Secondary hyperparathyroidism of renal origin: Secondary | ICD-10-CM | POA: Diagnosis not present

## 2019-08-01 DIAGNOSIS — D631 Anemia in chronic kidney disease: Secondary | ICD-10-CM | POA: Diagnosis not present

## 2019-08-01 DIAGNOSIS — D509 Iron deficiency anemia, unspecified: Secondary | ICD-10-CM | POA: Diagnosis not present

## 2019-08-01 DIAGNOSIS — N186 End stage renal disease: Secondary | ICD-10-CM | POA: Diagnosis not present

## 2019-08-01 DIAGNOSIS — Z992 Dependence on renal dialysis: Secondary | ICD-10-CM | POA: Diagnosis not present

## 2019-08-03 DIAGNOSIS — D509 Iron deficiency anemia, unspecified: Secondary | ICD-10-CM | POA: Diagnosis not present

## 2019-08-03 DIAGNOSIS — N2581 Secondary hyperparathyroidism of renal origin: Secondary | ICD-10-CM | POA: Diagnosis not present

## 2019-08-03 DIAGNOSIS — D631 Anemia in chronic kidney disease: Secondary | ICD-10-CM | POA: Diagnosis not present

## 2019-08-03 DIAGNOSIS — N186 End stage renal disease: Secondary | ICD-10-CM | POA: Diagnosis not present

## 2019-08-03 DIAGNOSIS — Z23 Encounter for immunization: Secondary | ICD-10-CM | POA: Diagnosis not present

## 2019-08-03 DIAGNOSIS — Z992 Dependence on renal dialysis: Secondary | ICD-10-CM | POA: Diagnosis not present

## 2019-08-06 DIAGNOSIS — Z992 Dependence on renal dialysis: Secondary | ICD-10-CM | POA: Diagnosis not present

## 2019-08-06 DIAGNOSIS — N186 End stage renal disease: Secondary | ICD-10-CM | POA: Diagnosis not present

## 2019-08-06 DIAGNOSIS — N2581 Secondary hyperparathyroidism of renal origin: Secondary | ICD-10-CM | POA: Diagnosis not present

## 2019-08-06 DIAGNOSIS — D509 Iron deficiency anemia, unspecified: Secondary | ICD-10-CM | POA: Diagnosis not present

## 2019-08-06 DIAGNOSIS — D631 Anemia in chronic kidney disease: Secondary | ICD-10-CM | POA: Diagnosis not present

## 2019-08-08 DIAGNOSIS — D631 Anemia in chronic kidney disease: Secondary | ICD-10-CM | POA: Diagnosis not present

## 2019-08-08 DIAGNOSIS — N2581 Secondary hyperparathyroidism of renal origin: Secondary | ICD-10-CM | POA: Diagnosis not present

## 2019-08-08 DIAGNOSIS — N186 End stage renal disease: Secondary | ICD-10-CM | POA: Diagnosis not present

## 2019-08-08 DIAGNOSIS — Z992 Dependence on renal dialysis: Secondary | ICD-10-CM | POA: Diagnosis not present

## 2019-08-08 DIAGNOSIS — D509 Iron deficiency anemia, unspecified: Secondary | ICD-10-CM | POA: Diagnosis not present

## 2019-08-09 DIAGNOSIS — Z992 Dependence on renal dialysis: Secondary | ICD-10-CM | POA: Diagnosis not present

## 2019-08-09 DIAGNOSIS — N186 End stage renal disease: Secondary | ICD-10-CM | POA: Diagnosis not present

## 2019-08-09 DIAGNOSIS — N2581 Secondary hyperparathyroidism of renal origin: Secondary | ICD-10-CM | POA: Diagnosis not present

## 2019-08-09 DIAGNOSIS — D631 Anemia in chronic kidney disease: Secondary | ICD-10-CM | POA: Diagnosis not present

## 2019-08-09 DIAGNOSIS — D509 Iron deficiency anemia, unspecified: Secondary | ICD-10-CM | POA: Diagnosis not present

## 2019-08-10 DIAGNOSIS — Z992 Dependence on renal dialysis: Secondary | ICD-10-CM | POA: Diagnosis not present

## 2019-08-10 DIAGNOSIS — N2581 Secondary hyperparathyroidism of renal origin: Secondary | ICD-10-CM | POA: Diagnosis not present

## 2019-08-10 DIAGNOSIS — D631 Anemia in chronic kidney disease: Secondary | ICD-10-CM | POA: Diagnosis not present

## 2019-08-10 DIAGNOSIS — D509 Iron deficiency anemia, unspecified: Secondary | ICD-10-CM | POA: Diagnosis not present

## 2019-08-10 DIAGNOSIS — N186 End stage renal disease: Secondary | ICD-10-CM | POA: Diagnosis not present

## 2019-08-13 DIAGNOSIS — N186 End stage renal disease: Secondary | ICD-10-CM | POA: Diagnosis not present

## 2019-08-13 DIAGNOSIS — Z992 Dependence on renal dialysis: Secondary | ICD-10-CM | POA: Diagnosis not present

## 2019-08-13 DIAGNOSIS — D631 Anemia in chronic kidney disease: Secondary | ICD-10-CM | POA: Diagnosis not present

## 2019-08-13 DIAGNOSIS — N2581 Secondary hyperparathyroidism of renal origin: Secondary | ICD-10-CM | POA: Diagnosis not present

## 2019-08-13 DIAGNOSIS — D509 Iron deficiency anemia, unspecified: Secondary | ICD-10-CM | POA: Diagnosis not present

## 2019-08-15 DIAGNOSIS — D509 Iron deficiency anemia, unspecified: Secondary | ICD-10-CM | POA: Diagnosis not present

## 2019-08-15 DIAGNOSIS — N2581 Secondary hyperparathyroidism of renal origin: Secondary | ICD-10-CM | POA: Diagnosis not present

## 2019-08-15 DIAGNOSIS — Z992 Dependence on renal dialysis: Secondary | ICD-10-CM | POA: Diagnosis not present

## 2019-08-15 DIAGNOSIS — N186 End stage renal disease: Secondary | ICD-10-CM | POA: Diagnosis not present

## 2019-08-15 DIAGNOSIS — D631 Anemia in chronic kidney disease: Secondary | ICD-10-CM | POA: Diagnosis not present

## 2019-08-17 DIAGNOSIS — N2581 Secondary hyperparathyroidism of renal origin: Secondary | ICD-10-CM | POA: Diagnosis not present

## 2019-08-17 DIAGNOSIS — D509 Iron deficiency anemia, unspecified: Secondary | ICD-10-CM | POA: Diagnosis not present

## 2019-08-17 DIAGNOSIS — N186 End stage renal disease: Secondary | ICD-10-CM | POA: Diagnosis not present

## 2019-08-17 DIAGNOSIS — D631 Anemia in chronic kidney disease: Secondary | ICD-10-CM | POA: Diagnosis not present

## 2019-08-17 DIAGNOSIS — Z992 Dependence on renal dialysis: Secondary | ICD-10-CM | POA: Diagnosis not present

## 2019-08-20 ENCOUNTER — Other Ambulatory Visit: Payer: Self-pay | Admitting: Oncology

## 2019-08-20 DIAGNOSIS — C3411 Malignant neoplasm of upper lobe, right bronchus or lung: Secondary | ICD-10-CM

## 2019-08-20 DIAGNOSIS — N2581 Secondary hyperparathyroidism of renal origin: Secondary | ICD-10-CM | POA: Diagnosis not present

## 2019-08-20 DIAGNOSIS — Z992 Dependence on renal dialysis: Secondary | ICD-10-CM | POA: Diagnosis not present

## 2019-08-20 DIAGNOSIS — D509 Iron deficiency anemia, unspecified: Secondary | ICD-10-CM | POA: Diagnosis not present

## 2019-08-20 DIAGNOSIS — D631 Anemia in chronic kidney disease: Secondary | ICD-10-CM | POA: Diagnosis not present

## 2019-08-20 DIAGNOSIS — N186 End stage renal disease: Secondary | ICD-10-CM | POA: Diagnosis not present

## 2019-08-22 DIAGNOSIS — N186 End stage renal disease: Secondary | ICD-10-CM | POA: Diagnosis not present

## 2019-08-22 DIAGNOSIS — D631 Anemia in chronic kidney disease: Secondary | ICD-10-CM | POA: Diagnosis not present

## 2019-08-22 DIAGNOSIS — Z992 Dependence on renal dialysis: Secondary | ICD-10-CM | POA: Diagnosis not present

## 2019-08-22 DIAGNOSIS — D509 Iron deficiency anemia, unspecified: Secondary | ICD-10-CM | POA: Diagnosis not present

## 2019-08-22 DIAGNOSIS — N2581 Secondary hyperparathyroidism of renal origin: Secondary | ICD-10-CM | POA: Diagnosis not present

## 2019-08-24 DIAGNOSIS — D631 Anemia in chronic kidney disease: Secondary | ICD-10-CM | POA: Diagnosis not present

## 2019-08-24 DIAGNOSIS — N186 End stage renal disease: Secondary | ICD-10-CM | POA: Diagnosis not present

## 2019-08-24 DIAGNOSIS — N2581 Secondary hyperparathyroidism of renal origin: Secondary | ICD-10-CM | POA: Diagnosis not present

## 2019-08-24 DIAGNOSIS — D509 Iron deficiency anemia, unspecified: Secondary | ICD-10-CM | POA: Diagnosis not present

## 2019-08-24 DIAGNOSIS — Z992 Dependence on renal dialysis: Secondary | ICD-10-CM | POA: Diagnosis not present

## 2019-08-27 ENCOUNTER — Other Ambulatory Visit: Payer: Self-pay

## 2019-08-27 ENCOUNTER — Ambulatory Visit (HOSPITAL_COMMUNITY)
Admission: RE | Admit: 2019-08-27 | Discharge: 2019-08-27 | Disposition: A | Discharge status: Home / Self Care | Payer: Medicare Other | Source: Ambulatory Visit | Attending: Oncology | Admitting: Oncology

## 2019-08-27 DIAGNOSIS — C3411 Malignant neoplasm of upper lobe, right bronchus or lung: Secondary | ICD-10-CM | POA: Insufficient documentation

## 2019-08-27 DIAGNOSIS — C801 Malignant (primary) neoplasm, unspecified: Secondary | ICD-10-CM | POA: Diagnosis not present

## 2019-08-27 DIAGNOSIS — D509 Iron deficiency anemia, unspecified: Secondary | ICD-10-CM | POA: Diagnosis not present

## 2019-08-27 DIAGNOSIS — Z992 Dependence on renal dialysis: Secondary | ICD-10-CM | POA: Diagnosis not present

## 2019-08-27 DIAGNOSIS — N2581 Secondary hyperparathyroidism of renal origin: Secondary | ICD-10-CM | POA: Diagnosis not present

## 2019-08-27 DIAGNOSIS — N186 End stage renal disease: Secondary | ICD-10-CM | POA: Diagnosis not present

## 2019-08-27 DIAGNOSIS — D631 Anemia in chronic kidney disease: Secondary | ICD-10-CM | POA: Diagnosis not present

## 2019-08-27 DIAGNOSIS — C7800 Secondary malignant neoplasm of unspecified lung: Secondary | ICD-10-CM | POA: Diagnosis not present

## 2019-08-29 DIAGNOSIS — N2581 Secondary hyperparathyroidism of renal origin: Secondary | ICD-10-CM | POA: Diagnosis not present

## 2019-08-29 DIAGNOSIS — D509 Iron deficiency anemia, unspecified: Secondary | ICD-10-CM | POA: Diagnosis not present

## 2019-08-29 DIAGNOSIS — D631 Anemia in chronic kidney disease: Secondary | ICD-10-CM | POA: Diagnosis not present

## 2019-08-29 DIAGNOSIS — Z992 Dependence on renal dialysis: Secondary | ICD-10-CM | POA: Diagnosis not present

## 2019-08-29 DIAGNOSIS — N186 End stage renal disease: Secondary | ICD-10-CM | POA: Diagnosis not present

## 2019-08-31 DIAGNOSIS — D631 Anemia in chronic kidney disease: Secondary | ICD-10-CM | POA: Diagnosis not present

## 2019-08-31 DIAGNOSIS — N2581 Secondary hyperparathyroidism of renal origin: Secondary | ICD-10-CM | POA: Diagnosis not present

## 2019-08-31 DIAGNOSIS — D509 Iron deficiency anemia, unspecified: Secondary | ICD-10-CM | POA: Diagnosis not present

## 2019-08-31 DIAGNOSIS — Z992 Dependence on renal dialysis: Secondary | ICD-10-CM | POA: Diagnosis not present

## 2019-08-31 DIAGNOSIS — N186 End stage renal disease: Secondary | ICD-10-CM | POA: Diagnosis not present

## 2019-09-03 DIAGNOSIS — Z992 Dependence on renal dialysis: Secondary | ICD-10-CM | POA: Diagnosis not present

## 2019-09-03 DIAGNOSIS — N2581 Secondary hyperparathyroidism of renal origin: Secondary | ICD-10-CM | POA: Diagnosis not present

## 2019-09-03 DIAGNOSIS — D631 Anemia in chronic kidney disease: Secondary | ICD-10-CM | POA: Diagnosis not present

## 2019-09-03 DIAGNOSIS — D509 Iron deficiency anemia, unspecified: Secondary | ICD-10-CM | POA: Diagnosis not present

## 2019-09-03 DIAGNOSIS — N186 End stage renal disease: Secondary | ICD-10-CM | POA: Diagnosis not present

## 2019-09-04 ENCOUNTER — Other Ambulatory Visit (HOSPITAL_COMMUNITY): Payer: Self-pay | Admitting: Oncology

## 2019-09-04 DIAGNOSIS — R918 Other nonspecific abnormal finding of lung field: Secondary | ICD-10-CM

## 2019-09-04 DIAGNOSIS — C3411 Malignant neoplasm of upper lobe, right bronchus or lung: Secondary | ICD-10-CM

## 2019-09-04 DIAGNOSIS — Z992 Dependence on renal dialysis: Secondary | ICD-10-CM | POA: Diagnosis not present

## 2019-09-04 DIAGNOSIS — J439 Emphysema, unspecified: Secondary | ICD-10-CM

## 2019-09-04 DIAGNOSIS — R062 Wheezing: Secondary | ICD-10-CM

## 2019-09-04 DIAGNOSIS — C641 Malignant neoplasm of right kidney, except renal pelvis: Secondary | ICD-10-CM

## 2019-09-04 DIAGNOSIS — Z902 Acquired absence of lung [part of]: Secondary | ICD-10-CM | POA: Diagnosis not present

## 2019-09-05 DIAGNOSIS — D631 Anemia in chronic kidney disease: Secondary | ICD-10-CM | POA: Diagnosis not present

## 2019-09-05 DIAGNOSIS — N2581 Secondary hyperparathyroidism of renal origin: Secondary | ICD-10-CM | POA: Diagnosis not present

## 2019-09-05 DIAGNOSIS — D509 Iron deficiency anemia, unspecified: Secondary | ICD-10-CM | POA: Diagnosis not present

## 2019-09-05 DIAGNOSIS — Z992 Dependence on renal dialysis: Secondary | ICD-10-CM | POA: Diagnosis not present

## 2019-09-05 DIAGNOSIS — N186 End stage renal disease: Secondary | ICD-10-CM | POA: Diagnosis not present

## 2019-09-07 DIAGNOSIS — N2581 Secondary hyperparathyroidism of renal origin: Secondary | ICD-10-CM | POA: Diagnosis not present

## 2019-09-07 DIAGNOSIS — N186 End stage renal disease: Secondary | ICD-10-CM | POA: Diagnosis not present

## 2019-09-07 DIAGNOSIS — D509 Iron deficiency anemia, unspecified: Secondary | ICD-10-CM | POA: Diagnosis not present

## 2019-09-07 DIAGNOSIS — Z992 Dependence on renal dialysis: Secondary | ICD-10-CM | POA: Diagnosis not present

## 2019-09-07 DIAGNOSIS — D631 Anemia in chronic kidney disease: Secondary | ICD-10-CM | POA: Diagnosis not present

## 2019-09-08 DIAGNOSIS — Z992 Dependence on renal dialysis: Secondary | ICD-10-CM | POA: Diagnosis not present

## 2019-09-08 DIAGNOSIS — N186 End stage renal disease: Secondary | ICD-10-CM | POA: Diagnosis not present

## 2019-09-08 DIAGNOSIS — N2581 Secondary hyperparathyroidism of renal origin: Secondary | ICD-10-CM | POA: Diagnosis not present

## 2019-09-08 DIAGNOSIS — D631 Anemia in chronic kidney disease: Secondary | ICD-10-CM | POA: Diagnosis not present

## 2019-09-08 DIAGNOSIS — D509 Iron deficiency anemia, unspecified: Secondary | ICD-10-CM | POA: Diagnosis not present

## 2019-09-10 DIAGNOSIS — N2581 Secondary hyperparathyroidism of renal origin: Secondary | ICD-10-CM | POA: Diagnosis not present

## 2019-09-10 DIAGNOSIS — N186 End stage renal disease: Secondary | ICD-10-CM | POA: Diagnosis not present

## 2019-09-10 DIAGNOSIS — D509 Iron deficiency anemia, unspecified: Secondary | ICD-10-CM | POA: Diagnosis not present

## 2019-09-10 DIAGNOSIS — D631 Anemia in chronic kidney disease: Secondary | ICD-10-CM | POA: Diagnosis not present

## 2019-09-10 DIAGNOSIS — Z992 Dependence on renal dialysis: Secondary | ICD-10-CM | POA: Diagnosis not present

## 2019-09-12 ENCOUNTER — Ambulatory Visit (HOSPITAL_COMMUNITY): Payer: Medicare Other

## 2019-09-12 DIAGNOSIS — N2581 Secondary hyperparathyroidism of renal origin: Secondary | ICD-10-CM | POA: Diagnosis not present

## 2019-09-12 DIAGNOSIS — N186 End stage renal disease: Secondary | ICD-10-CM | POA: Diagnosis not present

## 2019-09-12 DIAGNOSIS — D509 Iron deficiency anemia, unspecified: Secondary | ICD-10-CM | POA: Diagnosis not present

## 2019-09-12 DIAGNOSIS — Z992 Dependence on renal dialysis: Secondary | ICD-10-CM | POA: Diagnosis not present

## 2019-09-12 DIAGNOSIS — D631 Anemia in chronic kidney disease: Secondary | ICD-10-CM | POA: Diagnosis not present

## 2019-09-14 DIAGNOSIS — N186 End stage renal disease: Secondary | ICD-10-CM | POA: Diagnosis not present

## 2019-09-14 DIAGNOSIS — Z992 Dependence on renal dialysis: Secondary | ICD-10-CM | POA: Diagnosis not present

## 2019-09-14 DIAGNOSIS — N2581 Secondary hyperparathyroidism of renal origin: Secondary | ICD-10-CM | POA: Diagnosis not present

## 2019-09-14 DIAGNOSIS — D509 Iron deficiency anemia, unspecified: Secondary | ICD-10-CM | POA: Diagnosis not present

## 2019-09-14 DIAGNOSIS — D631 Anemia in chronic kidney disease: Secondary | ICD-10-CM | POA: Diagnosis not present

## 2019-09-17 ENCOUNTER — Ambulatory Visit (HOSPITAL_COMMUNITY)
Admission: RE | Admit: 2019-09-17 | Discharge: 2019-09-17 | Disposition: A | Discharge status: Home / Self Care | Payer: Medicare Other | Source: Ambulatory Visit | Attending: Oncology | Admitting: Oncology

## 2019-09-17 ENCOUNTER — Other Ambulatory Visit: Payer: Self-pay

## 2019-09-17 DIAGNOSIS — R918 Other nonspecific abnormal finding of lung field: Secondary | ICD-10-CM

## 2019-09-17 DIAGNOSIS — D509 Iron deficiency anemia, unspecified: Secondary | ICD-10-CM | POA: Diagnosis not present

## 2019-09-17 DIAGNOSIS — N186 End stage renal disease: Secondary | ICD-10-CM | POA: Diagnosis not present

## 2019-09-17 DIAGNOSIS — C3411 Malignant neoplasm of upper lobe, right bronchus or lung: Secondary | ICD-10-CM

## 2019-09-17 DIAGNOSIS — C641 Malignant neoplasm of right kidney, except renal pelvis: Secondary | ICD-10-CM

## 2019-09-17 DIAGNOSIS — N2581 Secondary hyperparathyroidism of renal origin: Secondary | ICD-10-CM | POA: Diagnosis not present

## 2019-09-17 DIAGNOSIS — Z9049 Acquired absence of other specified parts of digestive tract: Secondary | ICD-10-CM | POA: Insufficient documentation

## 2019-09-17 DIAGNOSIS — I2510 Atherosclerotic heart disease of native coronary artery without angina pectoris: Secondary | ICD-10-CM | POA: Insufficient documentation

## 2019-09-17 DIAGNOSIS — C3490 Malignant neoplasm of unspecified part of unspecified bronchus or lung: Secondary | ICD-10-CM | POA: Diagnosis not present

## 2019-09-17 DIAGNOSIS — I700 Atherosclerosis of aorta: Secondary | ICD-10-CM | POA: Insufficient documentation

## 2019-09-17 DIAGNOSIS — Z905 Acquired absence of kidney: Secondary | ICD-10-CM | POA: Diagnosis not present

## 2019-09-17 DIAGNOSIS — C649 Malignant neoplasm of unspecified kidney, except renal pelvis: Secondary | ICD-10-CM | POA: Diagnosis not present

## 2019-09-17 DIAGNOSIS — Z992 Dependence on renal dialysis: Secondary | ICD-10-CM | POA: Diagnosis not present

## 2019-09-17 DIAGNOSIS — C7800 Secondary malignant neoplasm of unspecified lung: Secondary | ICD-10-CM | POA: Insufficient documentation

## 2019-09-17 DIAGNOSIS — D631 Anemia in chronic kidney disease: Secondary | ICD-10-CM | POA: Diagnosis not present

## 2019-09-17 DIAGNOSIS — R062 Wheezing: Secondary | ICD-10-CM

## 2019-09-17 LAB — GLUCOSE, CAPILLARY: Glucose-Capillary: 126 mg/dL — ABNORMAL HIGH (ref 70–99)

## 2019-09-17 MED ORDER — FLUDEOXYGLUCOSE F - 18 (FDG) INJECTION
9.7500 | Freq: Once | INTRAVENOUS | Status: AC | PRN
  Administered 2019-09-17: 9.75 via INTRAVENOUS

## 2019-09-19 DIAGNOSIS — D631 Anemia in chronic kidney disease: Secondary | ICD-10-CM | POA: Diagnosis not present

## 2019-09-19 DIAGNOSIS — D509 Iron deficiency anemia, unspecified: Secondary | ICD-10-CM | POA: Diagnosis not present

## 2019-09-19 DIAGNOSIS — N2581 Secondary hyperparathyroidism of renal origin: Secondary | ICD-10-CM | POA: Diagnosis not present

## 2019-09-19 DIAGNOSIS — N186 End stage renal disease: Secondary | ICD-10-CM | POA: Diagnosis not present

## 2019-09-19 DIAGNOSIS — Z992 Dependence on renal dialysis: Secondary | ICD-10-CM | POA: Diagnosis not present

## 2019-09-20 ENCOUNTER — Encounter (HOSPITAL_COMMUNITY): Payer: Self-pay

## 2019-09-20 NOTE — Progress Notes (Unsigned)
Joanne Chars "Jett" Male, 86 y.o., [birth year] MRN:  [id number] Phone:  [phone] (H) ... PCP:  Sharilyn Sites, MD Primary Cvg:  Medicare/Medicare Part A And B Next Appt With Radiology ([facility]-CT 3) 10/02/2019 at 11:00 AM  FW: Biopsy Received: 2 days ago Message Contents  Markus Daft, MD  [facility]      Patient had PET CT. Schedule CT guided biopsy of right iliac bone lesion.   [place]   Previous Messages  ----- Message -----  From: Markus Daft, MD  Sent: 09/04/2019  4:39 PM EDT  To: Lenore Cordia  Subject: RE: Biopsy                    Discussed with Dr. Tressie Stalker. He is getting a PET and then we can review after PET.   Thanks,  Markus Daft  ----- Message -----  From: Lenore Cordia  Sent: 09/04/2019  4:03 PM EDT  To: Ir Procedure Requests  Subject: Biopsy                      Procedure Requested: CT Biopsy    Reason for Procedure: Left Lung    Provider Requesting: Everardo All  Provider Telephone:  [phone]    Other Info: Rad exam in Epic

## 2019-09-21 DIAGNOSIS — D631 Anemia in chronic kidney disease: Secondary | ICD-10-CM | POA: Diagnosis not present

## 2019-09-21 DIAGNOSIS — N2581 Secondary hyperparathyroidism of renal origin: Secondary | ICD-10-CM | POA: Diagnosis not present

## 2019-09-21 DIAGNOSIS — D509 Iron deficiency anemia, unspecified: Secondary | ICD-10-CM | POA: Diagnosis not present

## 2019-09-21 DIAGNOSIS — Z992 Dependence on renal dialysis: Secondary | ICD-10-CM | POA: Diagnosis not present

## 2019-09-21 DIAGNOSIS — N186 End stage renal disease: Secondary | ICD-10-CM | POA: Diagnosis not present

## 2019-09-24 DIAGNOSIS — N2581 Secondary hyperparathyroidism of renal origin: Secondary | ICD-10-CM | POA: Diagnosis not present

## 2019-09-24 DIAGNOSIS — N186 End stage renal disease: Secondary | ICD-10-CM | POA: Diagnosis not present

## 2019-09-24 DIAGNOSIS — D509 Iron deficiency anemia, unspecified: Secondary | ICD-10-CM | POA: Diagnosis not present

## 2019-09-24 DIAGNOSIS — D631 Anemia in chronic kidney disease: Secondary | ICD-10-CM | POA: Diagnosis not present

## 2019-09-24 DIAGNOSIS — Z992 Dependence on renal dialysis: Secondary | ICD-10-CM | POA: Diagnosis not present

## 2019-09-25 ENCOUNTER — Encounter: Payer: Self-pay | Admitting: *Deleted

## 2019-09-25 ENCOUNTER — Telehealth: Payer: Self-pay | Admitting: Oncology

## 2019-09-25 NOTE — Telephone Encounter (Signed)
Received a new pt referral from Dr. Everardo All for metastatic renal cell carcinoma and hx of lung cancer. [name] has been cld and scheduled to see Dr. Benay Spice on 5/27 at 2pm. Appt date and time has been given to the pt' niece, who's his legal guardian. She has been made aware to arrive 15 minutes early and she may attend the appt.

## 2019-09-25 NOTE — Progress Notes (Signed)
Referral from Dr. Everardo All at [facility] for patient to see Dr. Benay Spice. H/O lung and renal cell CA with new bone mets right iliac w/biopsy on 10/02/19. POA is niece, [name] at [phone]. Dr. Benay Spice agrees to see him on 10/04/19 at 2 pm. New patient scheduler, Seth Bake notified.

## 2019-09-26 DIAGNOSIS — D509 Iron deficiency anemia, unspecified: Secondary | ICD-10-CM | POA: Diagnosis not present

## 2019-09-26 DIAGNOSIS — N186 End stage renal disease: Secondary | ICD-10-CM | POA: Diagnosis not present

## 2019-09-26 DIAGNOSIS — N2581 Secondary hyperparathyroidism of renal origin: Secondary | ICD-10-CM | POA: Diagnosis not present

## 2019-09-26 DIAGNOSIS — Z992 Dependence on renal dialysis: Secondary | ICD-10-CM | POA: Diagnosis not present

## 2019-09-26 DIAGNOSIS — D631 Anemia in chronic kidney disease: Secondary | ICD-10-CM | POA: Diagnosis not present

## 2019-09-28 DIAGNOSIS — Z992 Dependence on renal dialysis: Secondary | ICD-10-CM | POA: Diagnosis not present

## 2019-09-28 DIAGNOSIS — N186 End stage renal disease: Secondary | ICD-10-CM | POA: Diagnosis not present

## 2019-09-28 DIAGNOSIS — N2581 Secondary hyperparathyroidism of renal origin: Secondary | ICD-10-CM | POA: Diagnosis not present

## 2019-09-28 DIAGNOSIS — D631 Anemia in chronic kidney disease: Secondary | ICD-10-CM | POA: Diagnosis not present

## 2019-09-28 DIAGNOSIS — D509 Iron deficiency anemia, unspecified: Secondary | ICD-10-CM | POA: Diagnosis not present

## 2019-10-01 DIAGNOSIS — N186 End stage renal disease: Secondary | ICD-10-CM | POA: Diagnosis not present

## 2019-10-01 DIAGNOSIS — N2581 Secondary hyperparathyroidism of renal origin: Secondary | ICD-10-CM | POA: Diagnosis not present

## 2019-10-01 DIAGNOSIS — D631 Anemia in chronic kidney disease: Secondary | ICD-10-CM | POA: Diagnosis not present

## 2019-10-01 DIAGNOSIS — D509 Iron deficiency anemia, unspecified: Secondary | ICD-10-CM | POA: Diagnosis not present

## 2019-10-01 DIAGNOSIS — Z992 Dependence on renal dialysis: Secondary | ICD-10-CM | POA: Diagnosis not present

## 2019-10-02 ENCOUNTER — Other Ambulatory Visit: Payer: Self-pay | Admitting: Radiology

## 2019-10-02 ENCOUNTER — Other Ambulatory Visit: Payer: Self-pay

## 2019-10-02 ENCOUNTER — Ambulatory Visit (HOSPITAL_COMMUNITY)
Admission: RE | Admit: 2019-10-02 | Discharge: 2019-10-02 | Disposition: A | Discharge status: Home / Self Care | Payer: Medicare Other | Source: Ambulatory Visit | Attending: Oncology | Admitting: Oncology

## 2019-10-02 DIAGNOSIS — Z85528 Personal history of other malignant neoplasm of kidney: Secondary | ICD-10-CM | POA: Insufficient documentation

## 2019-10-02 DIAGNOSIS — C7951 Secondary malignant neoplasm of bone: Secondary | ICD-10-CM | POA: Insufficient documentation

## 2019-10-02 DIAGNOSIS — M899 Disorder of bone, unspecified: Secondary | ICD-10-CM | POA: Diagnosis not present

## 2019-10-02 DIAGNOSIS — J439 Emphysema, unspecified: Secondary | ICD-10-CM

## 2019-10-02 DIAGNOSIS — C641 Malignant neoplasm of right kidney, except renal pelvis: Secondary | ICD-10-CM

## 2019-10-02 DIAGNOSIS — Z87891 Personal history of nicotine dependence: Secondary | ICD-10-CM | POA: Insufficient documentation

## 2019-10-02 DIAGNOSIS — C3411 Malignant neoplasm of upper lobe, right bronchus or lung: Secondary | ICD-10-CM

## 2019-10-02 DIAGNOSIS — Z79899 Other long term (current) drug therapy: Secondary | ICD-10-CM | POA: Diagnosis not present

## 2019-10-02 DIAGNOSIS — Z7951 Long term (current) use of inhaled steroids: Secondary | ICD-10-CM | POA: Diagnosis not present

## 2019-10-02 DIAGNOSIS — Z85118 Personal history of other malignant neoplasm of bronchus and lung: Secondary | ICD-10-CM | POA: Diagnosis not present

## 2019-10-02 DIAGNOSIS — Z905 Acquired absence of kidney: Secondary | ICD-10-CM | POA: Diagnosis not present

## 2019-10-02 DIAGNOSIS — R062 Wheezing: Secondary | ICD-10-CM

## 2019-10-02 DIAGNOSIS — Z8249 Family history of ischemic heart disease and other diseases of the circulatory system: Secondary | ICD-10-CM | POA: Insufficient documentation

## 2019-10-02 DIAGNOSIS — R918 Other nonspecific abnormal finding of lung field: Secondary | ICD-10-CM

## 2019-10-02 LAB — CBC
HCT: 37 % — ABNORMAL LOW (ref 39.0–52.0)
Hemoglobin: 11.8 g/dL — ABNORMAL LOW (ref 13.0–17.0)
MCH: 32.8 pg (ref 26.0–34.0)
MCHC: 31.9 g/dL (ref 30.0–36.0)
MCV: 102.8 fL — ABNORMAL HIGH (ref 80.0–100.0)
Platelets: 134 10*3/uL — ABNORMAL LOW (ref 150–400)
RBC: 3.6 MIL/uL — ABNORMAL LOW (ref 4.22–5.81)
RDW: 13.4 % (ref 11.5–15.5)
WBC: 5.2 10*3/uL (ref 4.0–10.5)
nRBC: 0 % (ref 0.0–0.2)

## 2019-10-02 LAB — PROTIME-INR
INR: 1 (ref 0.8–1.2)
Prothrombin Time: 13 seconds (ref 11.4–15.2)

## 2019-10-02 MED ORDER — SODIUM CHLORIDE 0.9 % IV SOLN: INTRAVENOUS | Status: DC

## 2019-10-02 MED ORDER — LIDOCAINE-EPINEPHRINE 1 %-1:100000 IJ SOLN
INTRAMUSCULAR | Status: AC
  Filled 2019-10-02: qty 1

## 2019-10-02 MED ORDER — FENTANYL CITRATE (PF) 100 MCG/2ML IJ SOLN
INTRAMUSCULAR | Status: AC
  Filled 2019-10-02: qty 2

## 2019-10-02 MED ORDER — MIDAZOLAM HCL 2 MG/2ML IJ SOLN
INTRAMUSCULAR | Status: AC
  Filled 2019-10-02: qty 2

## 2019-10-02 MED ORDER — MIDAZOLAM HCL 2 MG/2ML IJ SOLN
INTRAMUSCULAR | Status: AC | PRN
  Administered 2019-10-02: 1 mg via INTRAVENOUS

## 2019-10-02 MED ORDER — FENTANYL CITRATE (PF) 100 MCG/2ML IJ SOLN
INTRAMUSCULAR | Status: AC | PRN
  Administered 2019-10-02 (×2): 25 ug via INTRAVENOUS

## 2019-10-02 NOTE — Procedures (Signed)
Pre procedural Dx: Hypermetabolic lesion involving the anterior aspect of the right ilium  Post procedural Dx: Same  Technically successful CT guided biopsy of hypermetabolic lesion involving the anterior aspect of the right ilium   EBL: None.   Complications: None immediate.   Ronny Bacon, MD Pager #: [phone]

## 2019-10-02 NOTE — Discharge Instructions (Addendum)
Needle Biopsy, Care After These instructions tell you how to care for yourself after your procedure. Your doctor may also give you more specific instructions. Call your doctor if you have any problems or questions. What can I expect after the procedure? After the procedure, it is common to have:  Soreness.  Bruising.  Mild pain. Follow these instructions at home:   Return to your normal activities as told by your doctor. Ask your doctor what activities are safe for you.  Take over-the-counter and prescription medicines only as told by your doctor.  Wash your hands with soap and water before you change your bandage (dressing). If you cannot use soap and water, use hand sanitizer.  Follow instructions from your doctor about: ? How to take care of your puncture site. ? When and how to change your bandage. ? When to remove your bandage.  Check your puncture site every day for signs of infection. Watch for: ? Redness, swelling, or pain. ? Fluid or blood. ? Pus or a bad smell. ? Warmth.  Do not take baths, swim, or use a hot tub until your doctor approves. Ask your doctor if you may take showers. You may only be allowed to take sponge baths.  Keep all follow-up visits as told by your doctor. This is important. Contact a doctor if you have:  A fever.  Redness, swelling, or pain at the puncture site, and it lasts longer than a few days.  Fluid, blood, or pus coming from the puncture site.  Warmth coming from the puncture site. Get help right away if:  You have a lot of bleeding from the puncture site. Summary  After the procedure, it is common to have soreness, bruising, or mild pain at the puncture site.  Check your puncture site every day for signs of infection, such as redness, swelling, or pain.  Get help right away if you have severe bleeding from your puncture site. This information is not intended to replace advice given to you by your health care provider. Make  sure you discuss any questions you have with your health care provider. Document Revised: 05/09/2017 Document Reviewed: 05/09/2017 Elsevier Patient Education  [place]. Moderate Conscious Sedation, Adult Sedation is the use of medicines to promote relaxation and relieve discomfort and anxiety. Moderate conscious sedation is a type of sedation. Under moderate conscious sedation, you are less alert than normal, but you are still able to respond to instructions, touch, or both. Moderate conscious sedation is used during short medical and dental procedures. It is milder than deep sedation, which is a type of sedation under which you cannot be easily woken up. It is also milder than general anesthesia, which is the use of medicines to make you unconscious. Moderate conscious sedation allows you to return to your regular activities sooner. Tell a health care provider about:  Any allergies you have.  All medicines you are taking, including vitamins, herbs, eye drops, creams, and over-the-counter medicines.  Use of steroids (by mouth or creams).  Any problems you or family members have had with sedatives and anesthetic medicines.  Any blood disorders you have.  Any surgeries you have had.  Any medical conditions you have, such as sleep apnea.  Whether you are pregnant or may be pregnant.  Any use of cigarettes, alcohol, marijuana, or street drugs. What are the risks? Generally, this is a safe procedure. However, problems may occur, including:  Getting too much medicine (oversedation).  Nausea.  Allergic reaction to  medicines.  Trouble breathing. If this happens, a breathing tube may be used to help with breathing. It will be removed when you are awake and breathing on your own.  Heart trouble.  Lung trouble. What happens before the procedure? Staying hydrated Follow instructions from your health care provider about hydration, which may include:  Up to 2 hours before the  procedure - you may continue to drink clear liquids, such as water, clear fruit juice, black coffee, and plain tea. Eating and drinking restrictions Follow instructions from your health care provider about eating and drinking, which may include:  8 hours before the procedure - stop eating heavy meals or foods such as meat, fried foods, or fatty foods.  6 hours before the procedure - stop eating light meals or foods, such as toast or cereal.  6 hours before the procedure - stop drinking milk or drinks that contain milk.  2 hours before the procedure - stop drinking clear liquids. Medicine Ask your health care provider about:  Changing or stopping your regular medicines. This is especially important if you are taking diabetes medicines or blood thinners.  Taking medicines such as aspirin and ibuprofen. These medicines can thin your blood. Do not take these medicines before your procedure if your health care provider instructs you not to.  Tests and exams  You will have a physical exam.  You may have blood tests done to show: ? How well your kidneys and liver are working. ? How well your blood can clot. General instructions  Plan to have someone take you home from the hospital or clinic.  If you will be going home right after the procedure, plan to have someone with you for 24 hours. What happens during the procedure?  An IV tube will be inserted into one of your veins.  Medicine to help you relax (sedative) will be given through the IV tube.  The medical or dental procedure will be performed. What happens after the procedure?  Your blood pressure, heart rate, breathing rate, and blood oxygen level will be monitored often until the medicines you were given have worn off.  Do not drive for 24 hours. This information is not intended to replace advice given to you by your health care provider. Make sure you discuss any questions you have with your health care provider. Document  Revised: 04/08/2017 Document Reviewed: 08/16/2015 Elsevier Patient Education  2020 [employer].

## 2019-10-02 NOTE — Consult Note (Signed)
Chief Complaint: Hypermetabolic right iliac crest  Referring Physician(s): Dr. London Pepper  Supervising Physician: Sandi Mariscal  Patient Status: [facility] - Out-pt  History of Present Illness: [name] is a 86 y.o. male Former smoker history of renal cell carcinoma (2001) s/p right nephrectomy and SCC of  RUL (2020). Found to have a hypermetabolic right iliac crest   on PET for restaging. Team intial request was for a  lung biopsy - left however after review of PET scan IR Attending Dr. Anselm Pancoast recommended right iliac crest.    Past Medical History:  Diagnosis Date  . Arthritis    ankle , hip, knees , low back   . BPH (benign prostatic hypertrophy)   . Cancer ([place])    lung  . Cancer ([place]) 2001   renal  . Chronic inflammatory demyelinating polyneuropathy ([place]) 04/07/11   ,diagnosed 1992, tx. /w prednisone x 17 yrs. has been d/c for 3 yrs.   . Chronic kidney disease       . CIDP (chronic inflammatory demyelinating polyneuropathy) ([place])   . COPD (chronic obstructive pulmonary disease) ([place])    told that he has "a bit of emphysema"  . Dialysis patient ([place])   . GERD (gastroesophageal reflux disease)   . Hypertension    stress test done - many years ago  . Kidney calculus   . Pneumonia     Past Surgical History:  Procedure Laterality Date  . ANKLE FUSION     R ankle  . AV FISTULA PLACEMENT  06/01/2011   Procedure: ARTERIOVENOUS (AV) FISTULA CREATION;  Surgeon: Angelia Mould, MD;  Location: [facility] OR;  Service: Vascular;  Laterality: Left;  Creation of Left Arteriovenous fistula  . AV FISTULA PLACEMENT Left 07/12/2012   Procedure: ARTERIOVENOUS (AV) FISTULA CREATION;  Surgeon: Conrad [place], MD;  Location: [place];  Service: Vascular;  Laterality: Left;  . AV FISTULA PLACEMENT Left 4.11.14  . BASCILIC VEIN TRANSPOSITION Left 08/28/2012   Procedure: BASCILIC VEIN TRANSPOSITION;  Surgeon: Conrad [place]haven, MD;  Location: [place];  Service: Vascular;  Laterality: Left;  left 2nd  stage basilic vein transposition  . CHOLECYSTECTOMY    . COLONOSCOPY  2005   Dr. Laural Golden, diverticulosis, hemorrhoids, 2 small polyps  . FISTULOGRAM N/A 08/16/2011   Procedure: FISTULOGRAM;  Surgeon: Angelia Mould, MD;  Location: [facility] CATH LAB;  Service: Cardiovascular;  Laterality: N/A;  . fistulogram left radial cephalic AV fistula  [phone]     Dr. Scot Dock  . HIP ARTHROPLASTY Right    "partial"  . JOINT REPLACEMENT     L knee- 2010- [facility], L hip partial replacement   . LUNG REMOVAL, PARTIAL  2003   partial, right  . NEPHRECTOMY  2001   left  . SHUNTOGRAM N/A 05/15/2012   Procedure: Earney Mallet;  Surgeon: Conrad [place], MD;  Location: [facility] CATH LAB;  Service: Cardiovascular;  Laterality: N/A;  . SHUNTOGRAM N/A 05/09/2014   Procedure: FISTULOGRAM;  Surgeon: Conrad Sunrise, MD;  Location: [facility] CATH LAB;  Service: Cardiovascular;  Laterality: N/A;    Allergies: Patient has no known allergies.  Medications: Prior to Admission medications   Medication Sig Start Date End Date Taking? Authorizing Provider  cinacalcet (SENSIPAR) 60 MG tablet Take 60 mg by mouth daily.   Yes [provider]  Fluticasone-Salmeterol (ADVAIR) 100-50 MCG/DOSE AEPB Inhale 1 puff into the lungs in the morning and at bedtime. 09/04/19  Yes [provider]  lidocaine-prilocaine (EMLA) cream Apply 1 application  topically every Monday, Wednesday, and Friday with hemodialysis.  12/17/14  Yes [provider]  metoprolol succinate (TOPROL-XL) 25 MG 24 hr tablet Take 25 mg by mouth daily.   Yes [provider]  omeprazole (PRILOSEC) 20 MG capsule Take 20 mg by mouth daily.   Yes [provider]  sevelamer carbonate (RENVELA) 800 MG tablet Take 800 mg by mouth 3 (three) times daily with meals.    Yes [provider]  VENTOLIN HFA 108 (90 Base) MCG/ACT inhaler Inhale 2 puffs into the lungs every 4 (four) hours as needed for wheezing or shortness of breath.  09/04/19  Yes  [provider]  metoprolol tartrate (LOPRESSOR) 25 MG tablet Take 1 tablet (25 mg total) by mouth 2 (two) times daily. Patient not taking: Reported on 09/25/2019 12/27/17   Arnoldo Lenis, MD     Family History  Problem Relation Age of Onset  . Heart disease Mother   . Heart disease Father   . Heart attack Father   . Anesthesia problems Neg Hx   . Hypotension Neg Hx   . Malignant hyperthermia Neg Hx   . Pseudochol deficiency Neg Hx   . Colon cancer Neg Hx     Social History   Socioeconomic History  . Marital status: Married    Spouse name: Not on file  . Number of children: Not on file  . Years of education: Not on file  . Highest education level: Not on file  Occupational History  . Not on file  Tobacco Use  . Smoking status: Former Smoker    Years: 35.00    Types: Cigarettes    Quit date: 02/04/1977    Years since quitting: 42.6  . Smokeless tobacco: Current User    Types: Chew  . Tobacco comment: Quit in 1978  Substance and Sexual Activity  . Alcohol use: No    Alcohol/week: 0.0 standard drinks  . Drug use: No  . Sexual activity: Not on file  Other Topics Concern  . Not on file  Social History Narrative  . Not on file   Social Determinants of Health   Financial Resource Strain:   . Difficulty of Paying Living Expenses:   Food Insecurity:   . Worried About Charity fundraiser in the Last Year:   . Arboriculturist in the Last Year:   Transportation Needs:   . Film/video editor (Medical):   [name] Kitchen Lack of Transportation (Non-Medical):   Physical Activity:   . Days of Exercise per Week:   . Minutes of Exercise per Session:   Stress:   . Feeling of Stress :   Social Connections:   . Frequency of Communication with Friends and Family:   . Frequency of Social Gatherings with Friends and Family:   . Attends Religious Services:   . Active Member of Clubs or Organizations:   . Attends Archivist Meetings:   [name] Kitchen Marital Status:      Review of Systems: A 12 point ROS discussed and pertinent positives are indicated in the HPI above.  All other systems are negative.  Review of Systems  Constitutional: Negative for fever.  HENT: Negative for congestion.   Respiratory: Negative for cough and shortness of breath.   Cardiovascular: Negative for chest pain.  Gastrointestinal: Negative for abdominal pain.  Neurological: Negative for headaches.  Psychiatric/Behavioral: Negative for behavioral problems and confusion.    Vital Signs: BP (!) 156/60   Pulse 62  Temp (!) 97.5 F (36.4 C) (Skin)   Resp 17   Ht 5\' 7"  (1.702 m)   Wt 180 lb (81.6 kg)   SpO2 100%   BMI 28.19 kg/m   Physical Exam Vitals and nursing note reviewed.  Constitutional:      Appearance: He is well-developed.  HENT:     Head: Normocephalic.  Cardiovascular:     Rate and Rhythm: Normal rate and regular rhythm.     Heart sounds: Normal heart sounds.  Pulmonary:     Effort: Pulmonary effort is normal.     Breath sounds: Normal breath sounds.  Musculoskeletal:        General: Normal range of motion.     Cervical back: Normal range of motion.  Skin:    General: Skin is dry.  Neurological:     Mental Status: He is alert and oriented to person, place, and time.     Imaging: NM PET Image Restag (PS) Skull Base To Thigh  Result Date: 09/18/2019 CLINICAL DATA:  Subsequent treatment strategy for renal cell carcinoma and lung cancer. EXAM: NUCLEAR MEDICINE PET SKULL BASE TO THIGH TECHNIQUE: 9.75 mCi F-18 FDG was injected intravenously. Full-ring PET imaging was performed from the skull base to thigh after the radiotracer. CT data was obtained and used for attenuation correction and anatomic localization. Fasting blood glucose: 126 mg/dl COMPARISON:  CT chest dated 08/28/2019 and multiple prior studies. FINDINGS: Mediastinal blood pool activity: SUV max 3.07 Liver activity: SUV max NA NECK: No hypermetabolic lymph nodes in the neck. Incidental CT  findings: Atheromatous plaque in the carotid arteries bilaterally. CHEST: Enlarged RIGHT paratracheal lymph node (image 65, series 4) 12 mm short axis with intense FDG uptake (SUVmax = 14.4 no additional adenopathy with increased metabolic activity. Post radiation changes in the RIGHT upper lobe similar to the prior exam. Evidence of numerous pulmonary nodules in the chest without change in size or distribution. Largest in the LEFT lower lobe (image 48, series 8) 13 mm greatest axial dimension (SUVmax = 2.6) In the medial RIGHT lung base and 11 mm nodule (image 45, series 8) (SUVmax = 1.0 nodule at the RIGHT lung base (image 64, series 8) 12 mm) (SUVmax = 2.5) there is some misregistration due to motion artifact in this location.) . Incidental CT findings: Calcified atheromatous plaque in the thoracic aorta. Calcified coronary artery disease. Central pulmonary vasculature is of normal caliber ABDOMEN/PELVIS: No abnormal hypermetabolic activity within the liver, pancreas, adrenal glands, or spleen. No hypermetabolic lymph nodes in the abdomen or pelvis. Incidental CT findings: Lobular hepatic contours. Post cholecystectomy. Pancreas is unremarkable. Spleen is normal size without visible abnormality. Adrenal glands are normal. Post RIGHT nephrectomy. No signs of nodularity in the nephrectomy bed. Marked atrophy of the LEFT kidney with numerous areas of low attenuation most likely cysts without increased FDG uptake. No hydronephrosis. Extensive colonic diverticulosis. Focal area of uptake along the RIGHT: Appears to correspond with a diverticulum there is misregistration due to respiratory motion. There is another area in the central abdomen adjacent to small bowel loops and the colon without visible abnormality within these under distended bowel loops. Calcified atheromatous plaque in the abdominal aorta. Marked prostatomegaly. SKELETON: Intense FDG uptake associated with a lytic focus in the RIGHT iliac crest (image  158, series 4) area measures 1.5 x 0.8 cm (SUVmax = 6.1) FDG uptake in posterior elements at L5-S1 likely related to spondylolysis and listhesis at this level in the setting of degenerative change. Incidental CT findings:  Extensive spinal degenerative changes worse at L5-S1 as described. RIGHT hip arthroplasty. Glenohumeral degenerative changes worse on the RIGHT IMPRESSION: 1. Multifocal pulmonary metastatic lesions in the chest that display less FDG uptake than the mediastinal lymph node which shows marked FDG uptake. 2. Post treatment changes in the RIGHT upper lobe with increased metabolic activity as well, not to the same extent as mediastinal lymph node outlined above. FDG uptake is less than blood pool in this area. 3. Intense metabolic activity associated with a destructive lesion in the RIGHT iliac crest compatible with metastatic focus. 4. Areas of increased FDG uptake along the colon potentially related to mild inflammation in the setting of diverticular disease misregistration hampers assessment. Attention on follow-up and correlation with any symptoms is suggested. 5. Signs of RIGHT nephrectomy and cholecystectomy. 6. Marked spinal degenerative changes. Aortic Atherosclerosis (ICD10-I70.0). Electronically Signed   By: Zetta Bills M.D.   On: 09/18/2019 10:02    Labs:  CBC: No results for input(s): WBC, HGB, HCT, PLT in the last 8760 hours.  COAGS: No results for input(s): INR, APTT in the last 8760 hours.  BMP: No results for input(s): NA, K, CL, CO2, GLUCOSE, BUN, CALCIUM, CREATININE, GFRNONAA, GFRAA in the last 8760 hours.  Invalid input(s): CMP   Assessment and Plan:  86 y.o, male outpatient. Former smoker history of renal cell carcinoma (2001) s/p right nephrectomy and SCC of  RUL (2020). Found to have a hypermetabolic right iliac crest   on PET for restaging. Team intial request was for a  lung biopsy - left however after review of PET scan IR Attending Dr. Anselm Pancoast recommended  right iliac crest.     Pertinent Imaging 5.11.21 - PET reads Intense FDG uptake associated with a lytic focus in the RIGHT iliac crest (image 158, series 4) area measures 1.5 x 0.8 cm (SUVmax = 6.1)   Pertinent IR History none  Pertinent Allergies NKDA   labs pending  and medications are within acceptable parameters.  Patient is afebrile.  Risks and benefits of right iliac crest was discussed with the patient and/or patient's family including, but not limited to bleeding, infection, damage to adjacent structures or low yield requiring additional tests.  All of the questions were answered and there is agreement to proceed.  Consent signed and in chart.    Thank you for this interesting consult.  I greatly enjoyed meeting [name] and look forward to participating in their care.  A copy of this report was sent to the requesting provider on this date.  Electronically Signed: Avel Peace, NP 10/02/2019, 11:01 AM   I spent a total of  40 Minutes   in face to face in clinical consultation, greater than 50% of which was counseling/coordinating care for right iliac crest biopsy

## 2019-10-02 NOTE — Progress Notes (Signed)
Patient and niece was given discharge instructions. Both verbalized understanding.

## 2019-10-02 NOTE — Progress Notes (Signed)
Paged PA  and talked to charge nurse about orders.Charge nurse stated she was going to reach out to the PA.

## 2019-10-03 ENCOUNTER — Telehealth: Payer: Self-pay | Admitting: *Deleted

## 2019-10-03 DIAGNOSIS — N2581 Secondary hyperparathyroidism of renal origin: Secondary | ICD-10-CM | POA: Diagnosis not present

## 2019-10-03 DIAGNOSIS — N186 End stage renal disease: Secondary | ICD-10-CM | POA: Diagnosis not present

## 2019-10-03 DIAGNOSIS — D631 Anemia in chronic kidney disease: Secondary | ICD-10-CM | POA: Diagnosis not present

## 2019-10-03 DIAGNOSIS — D509 Iron deficiency anemia, unspecified: Secondary | ICD-10-CM | POA: Diagnosis not present

## 2019-10-03 DIAGNOSIS — Z992 Dependence on renal dialysis: Secondary | ICD-10-CM | POA: Diagnosis not present

## 2019-10-03 NOTE — Telephone Encounter (Signed)
Call from niece [facility]) reporting his biopsy was done on 10/02/19 and they were told may not have results by appointment time on 10/04/19. Asking if they need to reschedule?

## 2019-10-04 ENCOUNTER — Other Ambulatory Visit: Payer: Self-pay

## 2019-10-04 ENCOUNTER — Emergency Department (HOSPITAL_COMMUNITY): Payer: Medicare Other

## 2019-10-04 ENCOUNTER — Observation Stay (HOSPITAL_COMMUNITY)
Admission: EM | Admit: 2019-10-04 | Discharge: 2019-10-06 | Disposition: A | Discharge status: Home / Self Care | Payer: Medicare Other | Attending: Emergency Medicine | Admitting: Emergency Medicine

## 2019-10-04 ENCOUNTER — Encounter (HOSPITAL_COMMUNITY): Payer: Self-pay

## 2019-10-04 ENCOUNTER — Inpatient Hospital Stay: Payer: Medicare Other | Admitting: Oncology

## 2019-10-04 ENCOUNTER — Telehealth: Payer: Self-pay | Admitting: *Deleted

## 2019-10-04 DIAGNOSIS — M9701XA Periprosthetic fracture around internal prosthetic right hip joint, initial encounter: Secondary | ICD-10-CM | POA: Insufficient documentation

## 2019-10-04 DIAGNOSIS — Z992 Dependence on renal dialysis: Secondary | ICD-10-CM | POA: Insufficient documentation

## 2019-10-04 DIAGNOSIS — S79911A Unspecified injury of right hip, initial encounter: Secondary | ICD-10-CM | POA: Diagnosis not present

## 2019-10-04 DIAGNOSIS — R609 Edema, unspecified: Secondary | ICD-10-CM

## 2019-10-04 DIAGNOSIS — I10 Essential (primary) hypertension: Secondary | ICD-10-CM | POA: Diagnosis present

## 2019-10-04 DIAGNOSIS — C3411 Malignant neoplasm of upper lobe, right bronchus or lung: Secondary | ICD-10-CM | POA: Insufficient documentation

## 2019-10-04 DIAGNOSIS — Z96652 Presence of left artificial knee joint: Secondary | ICD-10-CM | POA: Diagnosis not present

## 2019-10-04 DIAGNOSIS — W19XXXA Unspecified fall, initial encounter: Secondary | ICD-10-CM

## 2019-10-04 DIAGNOSIS — R918 Other nonspecific abnormal finding of lung field: Secondary | ICD-10-CM | POA: Insufficient documentation

## 2019-10-04 DIAGNOSIS — Z20822 Contact with and (suspected) exposure to covid-19: Secondary | ICD-10-CM | POA: Diagnosis not present

## 2019-10-04 DIAGNOSIS — N186 End stage renal disease: Principal | ICD-10-CM | POA: Insufficient documentation

## 2019-10-04 DIAGNOSIS — Z87891 Personal history of nicotine dependence: Secondary | ICD-10-CM | POA: Insufficient documentation

## 2019-10-04 DIAGNOSIS — I120 Hypertensive chronic kidney disease with stage 5 chronic kidney disease or end stage renal disease: Secondary | ICD-10-CM | POA: Insufficient documentation

## 2019-10-04 DIAGNOSIS — Z79899 Other long term (current) drug therapy: Secondary | ICD-10-CM | POA: Diagnosis not present

## 2019-10-04 DIAGNOSIS — S72145A Nondisplaced intertrochanteric fracture of left femur, initial encounter for closed fracture: Secondary | ICD-10-CM

## 2019-10-04 DIAGNOSIS — J449 Chronic obstructive pulmonary disease, unspecified: Secondary | ICD-10-CM | POA: Diagnosis present

## 2019-10-04 DIAGNOSIS — I959 Hypotension, unspecified: Secondary | ICD-10-CM | POA: Diagnosis not present

## 2019-10-04 DIAGNOSIS — K219 Gastro-esophageal reflux disease without esophagitis: Secondary | ICD-10-CM | POA: Diagnosis not present

## 2019-10-04 DIAGNOSIS — Z03818 Encounter for observation for suspected exposure to other biological agents ruled out: Secondary | ICD-10-CM | POA: Diagnosis not present

## 2019-10-04 DIAGNOSIS — C649 Malignant neoplasm of unspecified kidney, except renal pelvis: Secondary | ICD-10-CM

## 2019-10-04 DIAGNOSIS — J441 Chronic obstructive pulmonary disease with (acute) exacerbation: Secondary | ICD-10-CM | POA: Diagnosis not present

## 2019-10-04 DIAGNOSIS — R52 Pain, unspecified: Secondary | ICD-10-CM | POA: Diagnosis not present

## 2019-10-04 LAB — BASIC METABOLIC PANEL
Anion gap: 14 (ref 5–15)
BUN: 46 mg/dL — ABNORMAL HIGH (ref 8–23)
CO2: 22 mmol/L (ref 22–32)
Calcium: 7.5 mg/dL — ABNORMAL LOW (ref 8.9–10.3)
Chloride: 98 mmol/L (ref 98–111)
Creatinine, Ser: 9.88 mg/dL — ABNORMAL HIGH (ref 0.61–1.24)
GFR calc Af Amer: 5 mL/min — ABNORMAL LOW (ref >60)
GFR calc non Af Amer: 4 mL/min — ABNORMAL LOW (ref >60)
Glucose, Bld: 117 mg/dL — ABNORMAL HIGH (ref 70–99)
Potassium: 4 mmol/L (ref 3.5–5.1)
Sodium: 134 mmol/L — ABNORMAL LOW (ref 135–145)

## 2019-10-04 LAB — CBC WITH DIFFERENTIAL/PLATELET
Abs Immature Granulocytes: 0.04 10*3/uL (ref 0.00–0.07)
Basophils Absolute: 0 10*3/uL (ref 0.0–0.1)
Basophils Relative: 0 %
Eosinophils Absolute: 0 10*3/uL (ref 0.0–0.5)
Eosinophils Relative: 0 %
HCT: 33.2 % — ABNORMAL LOW (ref 39.0–52.0)
Hemoglobin: 10.6 g/dL — ABNORMAL LOW (ref 13.0–17.0)
Immature Granulocytes: 1 %
Lymphocytes Relative: 6 %
Lymphs Abs: 0.5 10*3/uL — ABNORMAL LOW (ref 0.7–4.0)
MCH: 33.1 pg (ref 26.0–34.0)
MCHC: 31.9 g/dL (ref 30.0–36.0)
MCV: 103.8 fL — ABNORMAL HIGH (ref 80.0–100.0)
Monocytes Absolute: 0.7 10*3/uL (ref 0.1–1.0)
Monocytes Relative: 10 %
Neutro Abs: 5.8 10*3/uL (ref 1.7–7.7)
Neutrophils Relative %: 83 %
Platelets: 125 10*3/uL — ABNORMAL LOW (ref 150–400)
RBC: 3.2 MIL/uL — ABNORMAL LOW (ref 4.22–5.81)
RDW: 13.5 % (ref 11.5–15.5)
WBC: 7 10*3/uL (ref 4.0–10.5)
nRBC: 0 % (ref 0.0–0.2)

## 2019-10-04 LAB — SARS CORONAVIRUS 2 BY RT PCR (HOSPITAL ORDER, PERFORMED IN ~~LOC~~ HOSPITAL LAB): SARS Coronavirus 2: NEGATIVE

## 2019-10-04 MED ORDER — METHOCARBAMOL 500 MG PO TABS
500.0000 mg | ORAL_TABLET | Freq: Four times a day (QID) | ORAL | Status: DC | PRN
  Administered 2019-10-06: 500 mg via ORAL
  Filled 2019-10-04: qty 1

## 2019-10-04 MED ORDER — METHOCARBAMOL 1000 MG/10ML IJ SOLN
500.0000 mg | Freq: Four times a day (QID) | INTRAVENOUS | Status: DC | PRN
  Filled 2019-10-04: qty 5

## 2019-10-04 MED ORDER — OXYCODONE HCL 5 MG PO TABS
5.0000 mg | ORAL_TABLET | Freq: Four times a day (QID) | ORAL | Status: DC | PRN
  Administered 2019-10-05 – 2019-10-06 (×5): 5 mg via ORAL
  Filled 2019-10-04 (×5): qty 1

## 2019-10-04 MED ORDER — ACETAMINOPHEN 325 MG PO TABS
650.0000 mg | ORAL_TABLET | Freq: Four times a day (QID) | ORAL | Status: DC | PRN
  Administered 2019-10-04 – 2019-10-06 (×3): 650 mg via ORAL
  Filled 2019-10-04 (×3): qty 2

## 2019-10-04 MED ORDER — ALBUTEROL SULFATE (2.5 MG/3ML) 0.083% IN NEBU: 3.0000 mL | INHALATION_SOLUTION | RESPIRATORY_TRACT | Status: DC | PRN

## 2019-10-04 MED ORDER — PANTOPRAZOLE SODIUM 40 MG PO TBEC
40.0000 mg | DELAYED_RELEASE_TABLET | Freq: Every day | ORAL | Status: DC
  Administered 2019-10-04 – 2019-10-06 (×3): 40 mg via ORAL
  Filled 2019-10-04 (×3): qty 1

## 2019-10-04 MED ORDER — METOPROLOL SUCCINATE ER 25 MG PO TB24
25.0000 mg | ORAL_TABLET | Freq: Every day | ORAL | Status: DC
  Administered 2019-10-05 – 2019-10-06 (×2): 25 mg via ORAL
  Filled 2019-10-04 (×3): qty 1

## 2019-10-04 MED ORDER — CINACALCET HCL 30 MG PO TABS
60.0000 mg | ORAL_TABLET | Freq: Every day | ORAL | Status: DC
  Administered 2019-10-04: 60 mg via ORAL
  Filled 2019-10-04 (×3): qty 2

## 2019-10-04 MED ORDER — HEPARIN SODIUM (PORCINE) 5000 UNIT/ML IJ SOLN
5000.0000 [IU] | Freq: Three times a day (TID) | INTRAMUSCULAR | Status: DC
  Administered 2019-10-04 – 2019-10-06 (×5): 5000 [IU] via SUBCUTANEOUS
  Filled 2019-10-04 (×5): qty 1

## 2019-10-04 MED ORDER — SEVELAMER CARBONATE 800 MG PO TABS
800.0000 mg | ORAL_TABLET | Freq: Three times a day (TID) | ORAL | Status: DC
  Administered 2019-10-05 – 2019-10-06 (×4): 800 mg via ORAL
  Filled 2019-10-04 (×4): qty 1

## 2019-10-04 MED ORDER — MOMETASONE FURO-FORMOTEROL FUM 100-5 MCG/ACT IN AERO
2.0000 | INHALATION_SPRAY | Freq: Two times a day (BID) | RESPIRATORY_TRACT | Status: DC
  Administered 2019-10-04 – 2019-10-06 (×4): 2 via RESPIRATORY_TRACT
  Filled 2019-10-04: qty 8.8

## 2019-10-04 NOTE — Telephone Encounter (Signed)
Will not make the appointment today. Just took a fall and having EMS take him to [facility] ER due to left hip pain. Dr. Benay Spice notified. Will try to reschedule for week of 6/7, depending on if he stays in hospital.

## 2019-10-04 NOTE — ED Notes (Signed)
ED TO INPATIENT HANDOFF REPORT  ED Nurse Name and Phone #:   S Name/Age/Gender [name] 86 y.o. male Room/Bed: APAH3/APAH3  Code Status   Code Status: Prior  Home/SNF/Other Home Patient oriented to: self, place, time and situation Is this baseline? Yes   Triage Complete: Triage complete  Chief Complaint Periprosthetic fracture around internal prosthetic right hip joint ([place]) [Z66.01XA]  Triage Note EMS reports pt tripped over a step outside of a business and fell onto r hip.  Pt pain to r hip.  Skin tear to r elbow.  R leg shortening noted.  EMS says pt has history of hip replacement and recently had a biopsy of hip.      Allergies No Known Allergies  Level of Care/Admitting Diagnosis ED Disposition    ED Disposition Condition Comment   Admit  Hospital Area: [facility] [[id number]]  Level of Care: Med-Surg [16]  Covid Evaluation: Asymptomatic Screening Protocol (No Symptoms)  Diagnosis: Periprosthetic fracture around internal prosthetic right hip joint [facility]) [[id number]]  Admitting Physician: Manfred Shirts  Attending Physician: Waldron Labs, DAWOOD S [[id number]]       B Medical/Surgery History Past Medical History:  Diagnosis Date  . Arthritis    ankle , hip, knees , low back   . BPH (benign prostatic hypertrophy)   . Cancer ([place])    lung  . Cancer ([place]) 2001   renal  . Chronic inflammatory demyelinating polyneuropathy ([place]) 04/07/11   ,diagnosed 1992, tx. /w prednisone x 17 yrs. has been d/c for 3 yrs.   . Chronic kidney disease       . CIDP (chronic inflammatory demyelinating polyneuropathy) ([place])   . COPD (chronic obstructive pulmonary disease) ([place])    told that he has "a bit of emphysema"  . Dialysis patient ([place])   . GERD (gastroesophageal reflux disease)   . Hypertension    stress test done - many years ago  . Kidney calculus   . Pneumonia    Past Surgical History:  Procedure Laterality Date  . ANKLE FUSION     R ankle  . AV  FISTULA PLACEMENT  06/01/2011   Procedure: ARTERIOVENOUS (AV) FISTULA CREATION;  Surgeon: Angelia Mould, MD;  Location: [facility] OR;  Service: Vascular;  Laterality: Left;  Creation of Left Arteriovenous fistula  . AV FISTULA PLACEMENT Left 07/12/2012   Procedure: ARTERIOVENOUS (AV) FISTULA CREATION;  Surgeon: Conrad [place], MD;  Location: [place];  Service: Vascular;  Laterality: Left;  . AV FISTULA PLACEMENT Left 4.11.14  . BASCILIC VEIN TRANSPOSITION Left 08/28/2012   Procedure: BASCILIC VEIN TRANSPOSITION;  Surgeon: Conrad [place], MD;  Location: [place];  Service: Vascular;  Laterality: Left;  left 2nd stage basilic vein transposition  . CHOLECYSTECTOMY    . COLONOSCOPY  2005   Dr. Laural Golden, diverticulosis, hemorrhoids, 2 small polyps  . FISTULOGRAM N/A 08/16/2011   Procedure: FISTULOGRAM;  Surgeon: Angelia Mould, MD;  Location: [facility] CATH LAB;  Service: Cardiovascular;  Laterality: N/A;  . fistulogram left radial cephalic AV fistula  [phone]     Dr. Scot Dock  . HIP ARTHROPLASTY Right    "partial"  . JOINT REPLACEMENT     L knee- 2010- [facility], L hip partial replacement   . LUNG REMOVAL, PARTIAL  2003   partial, right  . NEPHRECTOMY  2001   left  . SHUNTOGRAM N/A 05/15/2012   Procedure: Earney Mallet;  Surgeon: Conrad Strong, MD;  Location: [facility] CATH LAB;  Service: Cardiovascular;  Laterality: N/A;  . SHUNTOGRAM N/A 05/09/2014   Procedure: FISTULOGRAM;  Surgeon: Conrad Severy, MD;  Location: [facility] CATH LAB;  Service: Cardiovascular;  Laterality: N/A;     A IV Location/Drains/Wounds Patient Lines/Drains/Airways Status   Active Line/Drains/Airways    Name:   Placement date:   Placement time:   Site:   Days:   Vascular Access Left Upper arm Arteriovenous fistula   06/01/11    0859    Upper arm   3047   Vascular Access Left Upper arm Arteriovenous fistula   07/12/12    1206    Upper arm   2640   Vascular Access Left Upper arm Arteriovenous fistula   08/28/12    0922    Upper arm   2593   Fistula /  Graft Left Upper arm Arteriovenous fistula   --    --    Upper arm      Wound / Incision (Open or Dehisced) 10/02/19 Puncture   10/02/19    1240    --   2          Intake/Output Last 24 hours No intake or output data in the 24 hours ending 10/04/19 2027  Labs/Imaging Results for orders placed or performed during the hospital encounter of 10/04/19 (from the past 48 hour(s))  Basic metabolic panel     Status: Abnormal   Collection Time: 10/04/19  6:12 PM  Result Value Ref Range   Sodium 134 (L) 135 - 145 mmol/L   Potassium 4.0 3.5 - 5.1 mmol/L   Chloride 98 98 - 111 mmol/L   CO2 22 22 - 32 mmol/L   Glucose, Bld 117 (H) 70 - 99 mg/dL    Comment: Glucose reference range applies only to samples taken after fasting for at least 8 hours.   BUN 46 (H) 8 - 23 mg/dL   Creatinine, Ser 9.88 (H) 0.61 - 1.24 mg/dL   Calcium 7.5 (L) 8.9 - 10.3 mg/dL   GFR calc non Af Amer 4 (L) >60 mL/min   GFR calc Af Amer 5 (L) >60 mL/min   Anion gap 14 5 - 15    Comment: Performed at [facility], [address], [place], [place] [address]  CBC with Differential     Status: Abnormal   Collection Time: 10/04/19  6:12 PM  Result Value Ref Range   WBC 7.0 4.0 - 10.5 K/uL   RBC 3.20 (L) 4.22 - 5.81 MIL/uL   Hemoglobin 10.6 (L) 13.0 - 17.0 g/dL   HCT 33.2 (L) 39.0 - 52.0 %   MCV 103.8 (H) 80.0 - 100.0 fL   MCH 33.1 26.0 - 34.0 pg   MCHC 31.9 30.0 - 36.0 g/dL   RDW 13.5 11.5 - 15.5 %   Platelets 125 (L) 150 - 400 K/uL   nRBC 0.0 0.0 - 0.2 %   Neutrophils Relative % 83 %   Neutro Abs 5.8 1.7 - 7.7 K/uL   Lymphocytes Relative 6 %   Lymphs Abs 0.5 (L) 0.7 - 4.0 K/uL   Monocytes Relative 10 %   Monocytes Absolute 0.7 0.1 - 1.0 K/uL   Eosinophils Relative 0 %   Eosinophils Absolute 0.0 0.0 - 0.5 K/uL   Basophils Relative 0 %   Basophils Absolute 0.0 0.0 - 0.1 K/uL   Immature Granulocytes 1 %   Abs Immature Granulocytes 0.04 0.00 - 0.07 K/uL    Comment: Performed at [facility], [address],  [place], [place] [address]  DG Hip Unilat With Pelvis 2-3 Views Right  Result Date: 10/04/2019 CLINICAL DATA:  Pain status post fall EXAM: DG HIP (WITH OR WITHOUT PELVIS) 2-3V RIGHT COMPARISON:  03/09/2019 FINDINGS: There is a new cortical step-off involving the lateral cortex of the proximal right femur. The patient is status post total hip arthroplasty. There is osteopenia. There is no dislocation. The hardware is intact. IMPRESSION: New cortical step-off involving the lateral aspect of the proximal femoral cortex is concerning for a nondisplaced fracture. Electronically Signed   By: Constance Holster M.D.   On: 10/04/2019 15:21    Pending Labs Unresulted Labs (From admission, onward)    Start     Ordered   10/04/19 1913  SARS Coronavirus 2 by RT PCR (hospital order, performed in [facility] hospital lab) Nasopharyngeal Nasopharyngeal Swab  (Tier 2 (TAT 2 hrs))  Once,   STAT    Question Answer Comment  Is this test for diagnosis or screening Screening   Symptomatic for COVID-19 as defined by CDC No   Hospitalized for COVID-19 No   Admitted to ICU for COVID-19 No   Previously tested for COVID-19 No   Resident in a congregate (group) care setting No   Employed in healthcare setting No   Has patient completed COVID vaccination(s) (2 doses of Pfizer/Moderna 1 dose of [employer]) Unknown      10/04/19 1912   Signed and Held  CBC  (heparin)  Once,   R    Comments: Baseline for heparin therapy IF NOT ALREADY DRAWN.  Notify MD if PLT < 100 K.    Signed and Held   Signed and Held  Creatinine, serum  (heparin)  Once,   R    Comments: Baseline for heparin therapy IF NOT ALREADY DRAWN.    Signed and Held   Signed and Held  CBC  Tomorrow morning,   R     Signed and Held   Signed and Held  Basic metabolic panel  Tomorrow morning,   R     Signed and Held          Vitals/Pain Today's Vitals   10/04/19 1545 10/04/19 1830 10/04/19 1832 10/04/19 1900  BP:   (!) 170/72 (!) 162/70  Pulse:  65  72 69  Resp:      Temp:      TempSrc:      SpO2: 97%  96% 96%  Weight:      Height:      PainSc:  0-No pain      Isolation Precautions No active isolations  Medications Medications  oxyCODONE (Oxy IR/ROXICODONE) immediate release tablet 5 mg (has no administration in time range)  acetaminophen (TYLENOL) tablet 650 mg (has no administration in time range)    Mobility walks with device Moderate fall risk   Focused Assessments    R Recommendations: See Admitting Provider Note  Report given to:   Additional Notes:

## 2019-10-04 NOTE — H&P (Signed)
TRH H&P   Patient Demographics:    [name], is a 86 y.o. male  MRN: [id number]   DOB - Aug 23, 1930  Admit Date - 10/04/2019  Outpatient Primary MD for the patient is Sharilyn Sites, MD  Referring MD/NP/PA: PA Nils Flack.  Patient coming from: Home  Chief Complaint  Patient presents with  . Hip Pain      HPI:    [name]  is a 86 y.o. male, ESRD on Monday Wednesday Friday schedule, COPD, GERD, hypertension, patient presents to ED secondary to right hip pain s/p fall, and with history of right hip fracture, status post partial knee replacement 12 years ago by Dr. Lorre Nick, patient had a mechanical fall at home, when he was climbing the stairs, not using his cane, so he had a misstep fell onto the right side and he had instant right hip pain, he denies any head trauma, dizziness, lightheadedness, he is not on any blood thinners, no loss of consciousness, focal deficits or vision change, patient had severely uncontrolled pain which prompted him to come to ED, his ESRD, on Monday Wednesday Friday schedule, as well patient recently had right bone biopsy for metastatic disease, biopsy came back significant for renal cancer, had recent PET scan with does show iliac and renal metastasis, patient used to follow with [facility] oncology, but currently he has been referred to Dr. Benay Spice in [place] which she has not seen yet. - in ED x-ray showing evidence of periprostatic right hip fracture, orthopedic recommendation, no surgery indicated, weightbearing as tolerated with walker, patient unable to ambulate with a walker while in ED, pain uncontrolled, so treated hospitalist was requested to admit.    Review of systems:    In addition to the HPI above, No Fever-chills, No Headache, No changes with Vision or hearing, No problems swallowing food or Liquids, No Chest pain, Cough or Shortness of  Breath, No Abdominal pain, No Nausea or Vommitting, Bowel movements are regular, No Blood in stool or Urine, No dysuria, No new skin rashes or bruises, Right hip pain s/p fall No new weakness, tingling, numbness in any extremity, No recent weight gain or loss, No polyuria, polydypsia or polyphagia, No significant Mental Stressors.  A full 10 point Review of Systems was done, except as stated above, all other Review of Systems were negative.   With Past History of the following :    Past Medical History:  Diagnosis Date  . Arthritis    ankle , hip, knees , low back   . BPH (benign prostatic hypertrophy)   . Cancer ([place])    lung  . Cancer ([place]) 2001   renal  . Chronic inflammatory demyelinating polyneuropathy ([place]) 04/07/11   ,diagnosed 1992, tx. /w prednisone x 17 yrs. has been d/c for 3 yrs.   . Chronic kidney disease       . CIDP (chronic inflammatory demyelinating polyneuropathy) (  [place])   . COPD (chronic obstructive pulmonary disease) ([place])    told that he has "a bit of emphysema"  . Dialysis patient ([place])   . GERD (gastroesophageal reflux disease)   . Hypertension    stress test done - many years ago  . Kidney calculus   . Pneumonia       Past Surgical History:  Procedure Laterality Date  . ANKLE FUSION     R ankle  . AV FISTULA PLACEMENT  06/01/2011   Procedure: ARTERIOVENOUS (AV) FISTULA CREATION;  Surgeon: Angelia Mould, MD;  Location: [facility] OR;  Service: Vascular;  Laterality: Left;  Creation of Left Arteriovenous fistula  . AV FISTULA PLACEMENT Left 07/12/2012   Procedure: ARTERIOVENOUS (AV) FISTULA CREATION;  Surgeon: Conrad [place], MD;  Location: [place];  Service: Vascular;  Laterality: Left;  . AV FISTULA PLACEMENT Left 4.11.14  . BASCILIC VEIN TRANSPOSITION Left 08/28/2012   Procedure: BASCILIC VEIN TRANSPOSITION;  Surgeon: Conrad [place], MD;  Location: [place];  Service: Vascular;  Laterality: Left;  left 2nd stage basilic vein transposition  .  CHOLECYSTECTOMY    . COLONOSCOPY  2005   Dr. Laural Golden, diverticulosis, hemorrhoids, 2 small polyps  . FISTULOGRAM N/A 08/16/2011   Procedure: FISTULOGRAM;  Surgeon: Angelia Mould, MD;  Location: [facility] CATH LAB;  Service: Cardiovascular;  Laterality: N/A;  . fistulogram left radial cephalic AV fistula  [phone]     Dr. Scot Dock  . HIP ARTHROPLASTY Right    "partial"  . JOINT REPLACEMENT     L knee- 2010- [facility], L hip partial replacement   . LUNG REMOVAL, PARTIAL  2003   partial, right  . NEPHRECTOMY  2001   left  . SHUNTOGRAM N/A 05/15/2012   Procedure: Earney Mallet;  Surgeon: Conrad [place], MD;  Location: [facility] CATH LAB;  Service: Cardiovascular;  Laterality: N/A;  . SHUNTOGRAM N/A 05/09/2014   Procedure: FISTULOGRAM;  Surgeon: Conrad [place], MD;  Location: [facility] CATH LAB;  Service: Cardiovascular;  Laterality: N/A;      Social History:     Social History   Tobacco Use  . Smoking status: Former Smoker    Years: 35.00    Types: Cigarettes    Quit date: 02/04/1977    Years since quitting: 42.6  . Smokeless tobacco: Current User    Types: Chew  . Tobacco comment: Quit in 1978  Substance Use Topics  . Alcohol use: No    Alcohol/week: 0.0 standard drinks     Lives -home alone Mobility -with a cane    Family History :     Family History  Problem Relation Age of Onset  . Heart disease Mother   . Heart disease Father   . Heart attack Father   . Anesthesia problems Neg Hx   . Hypotension Neg Hx   . Malignant hyperthermia Neg Hx   . Pseudochol deficiency Neg Hx   . Colon cancer Neg Hx      Home Medications:   Prior to Admission medications   Medication Sig Start Date End Date Taking? Authorizing Provider  cinacalcet (SENSIPAR) 60 MG tablet Take 60 mg by mouth daily.   Yes [provider]  Fluticasone-Salmeterol (ADVAIR) 100-50 MCG/DOSE AEPB Inhale 1 puff into the lungs in the morning and at bedtime. 09/04/19  Yes [provider]  lidocaine-prilocaine (EMLA)  cream Apply 1 application topically every Monday, Wednesday, and Friday with hemodialysis.  12/17/14  Yes [provider]  metoprolol succinate (TOPROL-XL) 25  MG 24 hr tablet Take 25 mg by mouth daily.   Yes [provider]  omeprazole (PRILOSEC) 20 MG capsule Take 20 mg by mouth daily.   Yes [provider]  sevelamer carbonate (RENVELA) 800 MG tablet Take 800 mg by mouth 3 (three) times daily with meals.    Yes [provider]  VENTOLIN HFA 108 (90 Base) MCG/ACT inhaler Inhale 2 puffs into the lungs every 4 (four) hours as needed for wheezing or shortness of breath.  09/04/19  Yes [provider]     Allergies:    No Known Allergies   Physical Exam:   Vitals  Blood pressure (!) 162/70, pulse 69, temperature 98 F (36.7 C), temperature source Oral, resp. rate 16, height 5\' 7"  (1.702 m), weight 81.2 kg, SpO2 96 %.   1. General frail elderly male, laying in bed in no apparent distress  2. Normal affect and insight, Not Suicidal or Homicidal, Awake Alert, Oriented X 3.  3. No F.N deficits, ALL C.Nerves Intact, Strength 5/5 all 4 extremities, Sensation intact all 4 extremities, Plantars down going.  4. Ears and Eyes appear Normal, Conjunctivae clear, PERRLA. Moist Oral Mucosa.  5. Supple Neck, No JVD, No cervical lymphadenopathy appriciated, No Carotid Bruits.  6. Symmetrical Chest wall movement, Good air movement bilaterally, CTAB.  7. RRR, No Gallops, Rubs or Murmurs, No Parasternal Heave.  8. Positive Bowel Sounds, Abdomen Soft, No tenderness, No organomegaly appriciated,No rebound -guarding or rigidity.  9.  No Cyanosis, Normal Skin Turgor, No Skin Rash or Bruise.  10. Good muscle tone,  joints appear normal , no effusions, right lower extremity motion limited secondary to pain     Data Review:    CBC Recent Labs  Lab 10/02/19 1040 10/04/19 1812  WBC 5.2 7.0  HGB 11.8* 10.6*  HCT 37.0* 33.2*  PLT 134* 125*  MCV 102.8*  103.8*  MCH 32.8 33.1  MCHC 31.9 31.9  RDW 13.4 13.5  LYMPHSABS  --  0.5*  MONOABS  --  0.7  EOSABS  --  0.0  BASOSABS  --  0.0   ------------------------------------------------------------------------------------------------------------------  Chemistries  Recent Labs  Lab 10/04/19 1812  NA 134*  K 4.0  CL 98  CO2 22  GLUCOSE 117*  BUN 46*  CREATININE 9.88*  CALCIUM 7.5*   ------------------------------------------------------------------------------------------------------------------ estimated creatinine clearance is 5.4 mL/min (A) (by C-G formula based on SCr of 9.88 mg/dL (H)). ------------------------------------------------------------------------------------------------------------------ No results for input(s): TSH, T4TOTAL, T3FREE, THYROIDAB in the last 72 hours.  Invalid input(s): FREET3  Coagulation profile Recent Labs  Lab 10/02/19 1135  INR 1.0   ------------------------------------------------------------------------------------------------------------------- No results for input(s): DDIMER in the last 72 hours. -------------------------------------------------------------------------------------------------------------------  Cardiac Enzymes No results for input(s): CKMB, TROPONINI, MYOGLOBIN in the last 168 hours.  Invalid input(s): CK ------------------------------------------------------------------------------------------------------------------    Component Value Date/Time   BNP 424.0 (H) 05/30/2017 1105     ---------------------------------------------------------------------------------------------------------------  Urinalysis    Component Value Date/Time   COLORURINE YELLOW 01/01/2009 1054   APPEARANCEUR CLEAR 01/01/2009 1054   LABSPEC 1.018 01/01/2009 1054   PHURINE 5.0 01/01/2009 1054   GLUCOSEU NEGATIVE 01/01/2009 1054   HGBUR NEGATIVE 01/01/2009 1054   BILIRUBINUR NEGATIVE 01/01/2009 1054   [place] 01/01/2009  1054   PROTEINUR 100 (A) 01/01/2009 1054   UROBILINOGEN 0.2 01/01/2009 1054   NITRITE NEGATIVE 01/01/2009 1054   [place] 01/01/2009 1054    ----------------------------------------------------------------------------------------------------------------   Imaging Results:    DG Hip Unilat With Pelvis 2-3 Views Right  Result Date: 10/04/2019 CLINICAL DATA:  Pain status post fall EXAM: DG HIP (WITH OR WITHOUT PELVIS) 2-3V RIGHT COMPARISON:  03/09/2019 FINDINGS: There is a new cortical step-off involving the lateral cortex of the proximal right femur. The patient is status post total hip arthroplasty. There is osteopenia. There is no dislocation. The hardware is intact. IMPRESSION: New cortical step-off involving the lateral aspect of the proximal femoral cortex is concerning for a nondisplaced fracture. Electronically Signed   By: Constance Holster M.D.   On: 10/04/2019 15:21     Assessment & Plan:    Active Problems:   End stage renal disease (HCC)   Malignant neoplasm of right upper lobe of lung (HCC)   Pulmonary nodules/lesions, multiple   COPD (chronic obstructive pulmonary disease) (HCC)   Hypertension   GERD (gastroesophageal reflux disease)   Renal cancer (HCC)   Periprosthetic fracture around internal prosthetic right hip joint (HCC)   Right hip periprosthetic fracture -Input greatly appreciated, recommendation for weightbearing as tolerated with support of walker, has been attempted to ED, patient was unable to tolerate any ambulatory activity with a walker, so patient will be admitted, for official evaluation by PT, OT, and pain control. -Need follow-up in 2 weeks for x-rays  ESRD -Patient on hemodialysis Monday Wednesday Friday, will have morning team consult renal -Continue with home medication, continue with renal diet and fluid restriction  Metastatic renal cancer with pulmonary and iliac bone involvement. -He is to follow with [facility]ology, but  recently he has been referred to Dr. Benay Spice, he is supposed to follow with him.  COPD -No active wheezing, continue with home Dulera and as needed nebs  Hypertension -Continue with home medications  GERD -Continue with home medications   DVT Prophylaxis Heparin   AM Labs Ordered, also please review Full Orders  Family Communication: Admission, patients condition and plan of care including tests being ordered have been discussed with the patient and niece at bedside who indicate understanding and agree with the plan and Code Status.  Code Status DNR was confirmed with the patient, niece at bedside  Likely DC to  SNF  Condition GUARDED    Consults called: ortho by ED, renal to be called in am  Admission status:  Observation  Time spent in minutes : 55 minutes   Phillips Climes M.D on 10/04/2019 at 7:56 PM   [facility] - Office  [phone]

## 2019-10-04 NOTE — ED Notes (Signed)
Attempted report to floor x2.

## 2019-10-04 NOTE — ED Notes (Signed)
Attempted report to floor x1.

## 2019-10-04 NOTE — ED Notes (Signed)
Pt was able to put weight on hip but unable to walk any steps.

## 2019-10-04 NOTE — ED Triage Notes (Signed)
EMS reports pt tripped over a step outside of a business and fell onto r hip.  Pt pain to r hip.  Skin tear to r elbow.  R leg shortening noted.  EMS says pt has history of hip replacement and recently had a biopsy of hip.

## 2019-10-04 NOTE — ED Provider Notes (Signed)
[facility] EMERGENCY DEPARTMENT Provider Note   CSN: [id number] Arrival date & time: 10/04/19  1324     History Chief Complaint  Patient presents with  . Hip Pain    [name] is a 86 y.o. male with history of COPD, ESRD on dialysis Monday Wednesday Friday, GERD, hypertension presents for evaluation of acute onset, persistent right hip pain secondary to fall just prior to arrival.  Patient reports that he was going up some steps to go into a building to pay a bill when he misstepped, falling and landing on his right side.  He denies head injury or loss of consciousness.  He is not on any blood thinners.  He denies headache, neck pain, vision changes, numbness or tingling of the extremities.  He is complaining of throbbing sore right hip pain which worsens with certain movements.  He does not think that he is able to bear weight but is unsure.  He also sustained a skin tear to the right upper arm.  He denies any pain to the right upper extremity.  He denies any prodrome leading up to the fall including lightheadedness, shortness of breath, chest pain, dizziness, or palpitations.  He was last dialyzed yesterday and underwent full treatment.  Also of note he recently underwent biopsy of the right hip for evaluation of possible neoplasm.  He does have a history of metastatic renal cell carcinoma and lung cancer and is followed by Dr. Learta Codding with oncology.  The history is provided by the patient and medical records.       Past Medical History:  Diagnosis Date  . Arthritis    ankle , hip, knees , low back   . BPH (benign prostatic hypertrophy)   . Cancer ([place])    lung  . Cancer ([place]) 2001   renal  . 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 /w prednisone x 17 yrs. has been d/c for 3 yrs.   . Chronic kidney disease       . CIDP (chronic inflammatory demyelinating polyneuropathy) ([place])   . COPD (chronic obstructive pulmonary disease)  ([place])    told that he has "a bit of emphysema"  . Dialysis patient ([place])   . GERD (gastroesophageal reflux disease)   . Hypertension    stress test done - many years ago  . Kidney calculus   . Pneumonia     Patient Active Problem List   Diagnosis Date Noted  . Renal cancer ([place]) 10/04/2019  . Periprosthetic fracture around internal prosthetic right hip joint ([place]) 10/04/2019  . Community acquired pneumonia 05/30/2017  . Influenza A 05/30/2017  . BPH (benign prostatic hypertrophy) 05/30/2017  . COPD (chronic obstructive pulmonary disease) ([place]) 05/30/2017  . Hypertension 05/30/2017  . GERD (gastroesophageal reflux disease) 05/30/2017  . Pulmonary nodules/lesions, multiple 09/14/2016  . Bowel habit changes 09/11/2015  . Malignant neoplasm of right upper lobe of lung ([place]) 10/27/2012  . Visit for wound check 10/09/2012  . Swollen arm 09/15/2012  . Encounter for adequacy testing for hemodialysis ([place]) 09/01/2012  . Aftercare following surgery of the circulatory system, [place] 08/18/2012  . Other complications due to renal dialysis device, implant, and graft 04/19/2012  . End stage renal disease ([place]) 07/14/2011  . Pre-operative cardiovascular examination 04/07/2011    Past Surgical History:  Procedure Laterality Date  . ANKLE FUSION     R ankle  . AV FISTULA PLACEMENT  06/01/2011   Procedure: ARTERIOVENOUS (AV) FISTULA CREATION;  Surgeon:  Angelia Mould, MD;  Location: [facility] OR;  Service: Vascular;  Laterality: Left;  Creation of Left Arteriovenous fistula  . AV FISTULA PLACEMENT Left 07/12/2012   Procedure: ARTERIOVENOUS (AV) FISTULA CREATION;  Surgeon: Conrad Albertson, MD;  Location: [facility];  Service: Vascular;  Laterality: Left;  . AV FISTULA PLACEMENT Left 4.11.14  . BASCILIC VEIN TRANSPOSITION Left 08/28/2012   Procedure: BASCILIC VEIN TRANSPOSITION;  Surgeon: Conrad Munford, MD;  Location: [facility];  Service: Vascular;  Laterality: Left;  left 2nd stage basilic vein transposition  .  CHOLECYSTECTOMY    . COLONOSCOPY  2005   Dr. Laural Golden, diverticulosis, hemorrhoids, 2 small polyps  . FISTULOGRAM N/A 08/16/2011   Procedure: FISTULOGRAM;  Surgeon: Angelia Mould, MD;  Location: [facility] CATH LAB;  Service: Cardiovascular;  Laterality: N/A;  . fistulogram left radial cephalic AV fistula  16-05-930     Dr. Scot Dock  . HIP ARTHROPLASTY Right    "partial"  . JOINT REPLACEMENT     L knee- 2010- [facility], L hip partial replacement   . LUNG REMOVAL, PARTIAL  2003   partial, right  . NEPHRECTOMY  2001   left  . SHUNTOGRAM N/A 05/15/2012   Procedure: Earney Mallet;  Surgeon: Conrad Onslow, MD;  Location: [facility] CATH LAB;  Service: Cardiovascular;  Laterality: N/A;  . SHUNTOGRAM N/A 05/09/2014   Procedure: FISTULOGRAM;  Surgeon: Conrad Obetz, MD;  Location: [facility] CATH LAB;  Service: Cardiovascular;  Laterality: N/A;       Family History  Problem Relation Age of Onset  . Heart disease Mother   . Heart disease Father   . Heart attack Father   . Anesthesia problems Neg Hx   . Hypotension Neg Hx   . Malignant hyperthermia Neg Hx   . Pseudochol deficiency Neg Hx   . Colon cancer Neg Hx     Social History   Tobacco Use  . Smoking status: Former Smoker    Years: 35.00    Types: Cigarettes    Quit date: 02/04/1977    Years since quitting: 42.6  . Smokeless tobacco: Current User    Types: Chew  . Tobacco comment: Quit in 1978  Substance Use Topics  . Alcohol use: No    Alcohol/week: 0.0 standard drinks  . Drug use: No    Home Medications Prior to Admission medications   Medication Sig Start Date End Date Taking? Authorizing Provider  cinacalcet (SENSIPAR) 60 MG tablet Take 60 mg by mouth daily.   Yes [provider]  Fluticasone-Salmeterol (ADVAIR) 100-50 MCG/DOSE AEPB Inhale 1 puff into the lungs in the morning and at bedtime. 09/04/19  Yes [provider]  lidocaine-prilocaine (EMLA) cream Apply 1 application topically every Monday, Wednesday, and Friday with  hemodialysis.  12/17/14  Yes [provider]  metoprolol succinate (TOPROL-XL) 25 MG 24 hr tablet Take 25 mg by mouth daily.   Yes [provider]  omeprazole (PRILOSEC) 20 MG capsule Take 20 mg by mouth daily.   Yes [provider]  sevelamer carbonate (RENVELA) 800 MG tablet Take 800 mg by mouth 3 (three) times daily with meals.    Yes [provider]  VENTOLIN HFA 108 (90 Base) MCG/ACT inhaler Inhale 2 puffs into the lungs every 4 (four) hours as needed for wheezing or shortness of breath.  09/04/19  Yes [provider]    Allergies    Patient has no known allergies.  Review of Systems   Review of Systems  Constitutional:  Negative for fever.  Respiratory: Negative for shortness of breath.   Cardiovascular: Negative for chest pain.  Gastrointestinal: Negative for abdominal pain, nausea and vomiting.  Musculoskeletal: Positive for arthralgias.  Neurological: Negative for syncope, light-headedness, numbness and headaches.  Hematological: Does not bruise/bleed easily.  All other systems reviewed and are negative.   Physical Exam Updated Vital Signs BP (!) 162/70   Pulse 69   Temp 98 F (36.7 C) (Oral)   Resp 16   Ht 5\' 7"  (1.702 m)   Wt 81.2 kg   SpO2 96%   BMI 28.04 kg/m   Physical Exam Vitals and nursing note reviewed.  Constitutional:      General: He is not in acute distress.    Appearance: He is well-developed.  HENT:     Head: Normocephalic and atraumatic.     Comments: No Battle's signs, no raccoon's eyes, no rhinorrhea. No hemotympanum. No tenderness to palpation of the face or skull. No deformity, crepitus, or swelling noted.  Eyes:     General:        Right eye: No discharge.        Left eye: No discharge.     Conjunctiva/sclera: Conjunctivae normal.  Neck:     Vascular: No JVD.     Trachea: No tracheal deviation.     Comments: No midline spine TTP, no paraspinal muscle tenderness, no deformity, crepitus, or  step-off noted  Cardiovascular:     Rate and Rhythm: Normal rate and regular rhythm.     Comments: AV fistula to the left upper extremity with palpable thrill Pulmonary:     Effort: Pulmonary effort is normal.     Breath sounds: Normal breath sounds.  Abdominal:     General: There is no distension.     Palpations: Abdomen is soft.     Tenderness: There is no abdominal tenderness. There is no guarding or rebound.  Musculoskeletal:        General: Tenderness present.     Cervical back: Normal range of motion and neck supple.     Comments: Tenderness to palpation of the anterior and lateral aspect of the right hip.  No ecchymosis crepitus or deformity.  Right lower extremity is shortened compared to the left which patient reports is chronic.  He is able to actively flex at the hip without difficulty.  Otherwise 5/5 strength of BUE and BLE major muscle groups.  No tenderness to palpation of the right upper extremity but he does have a semilunar skin tear overlying the upper arm.  EMS repaired this with a Tegaderm after irrigating it.  Bleeding is controlled.  Skin:    General: Skin is warm and dry.     Capillary Refill: Capillary refill takes less than 2 seconds.     Findings: No erythema.  Neurological:     Mental Status: He is alert and oriented to person, place, and time.     Comments: Mental Status:  Alert, thought content appropriate, able to give a coherent history. Speech fluent without evidence of aphasia. Able to follow 2 step commands without difficulty.  Cranial Nerves: Appear grossly intact Motor:  Normal tone. 5/5 strength of BUE and BLE major muscle groups including strong and equal grip strength and dorsiflexion/plantar flexion Sensory: light touch normal in all extremities.   Psychiatric:        Behavior: Behavior normal.     ED Results / Procedures / Treatments   Labs (all labs ordered are listed, but only abnormal  results are displayed) Labs Reviewed  BASIC  METABOLIC PANEL - Abnormal; Notable for the following components:      Result Value   Sodium 134 (*)    Glucose, Bld 117 (*)    BUN 46 (*)    Creatinine, Ser 9.88 (*)    Calcium 7.5 (*)    GFR calc non Af Amer 4 (*)    GFR calc Af Amer 5 (*)    All other components within normal limits  CBC WITH DIFFERENTIAL/PLATELET - Abnormal; Notable for the following components:   RBC 3.20 (*)    Hemoglobin 10.6 (*)    HCT 33.2 (*)    MCV 103.8 (*)    Platelets 125 (*)    Lymphs Abs 0.5 (*)    All other components within normal limits  SARS CORONAVIRUS 2 BY RT PCR (HOSPITAL ORDER, [facility])  CBC  BASIC METABOLIC PANEL    EKG None  Radiology DG Hip Unilat With Pelvis 2-3 Views Right  Result Date: 10/04/2019 CLINICAL DATA:  Pain status post fall EXAM: DG HIP (WITH OR WITHOUT PELVIS) 2-3V RIGHT COMPARISON:  03/09/2019 FINDINGS: There is a new cortical step-off involving the lateral cortex of the proximal right femur. The patient is status post total hip arthroplasty. There is osteopenia. There is no dislocation. The hardware is intact. IMPRESSION: New cortical step-off involving the lateral aspect of the proximal femoral cortex is concerning for a nondisplaced fracture. Electronically Signed   By: Constance Holster M.D.   On: 10/04/2019 15:21    Procedures Procedures (including critical care time)  Medications Ordered in ED Medications  oxyCODONE (Oxy IR/ROXICODONE) immediate release tablet 5 mg (has no administration in time range)  acetaminophen (TYLENOL) tablet 650 mg (has no administration in time range)  metoprolol succinate (TOPROL-XL) 24 hr tablet 25 mg (has no administration in time range)  pantoprazole (PROTONIX) EC tablet 40 mg (has no administration in time range)  cinacalcet (SENSIPAR) tablet 60 mg (has no administration in time range)  sevelamer carbonate (RENVELA) tablet 800 mg (has no administration in time range)  mometasone-formoterol (DULERA)  100-5 MCG/ACT inhaler 2 puff (has no administration in time range)  albuterol (PROVENTIL) (2.5 MG/3ML) 0.083% nebulizer solution 3 mL (has no administration in time range)  heparin injection 5,000 Units (has no administration in time range)  methocarbamol (ROBAXIN) tablet 500 mg (has no administration in time range)    Or  methocarbamol (ROBAXIN) 500 mg in dextrose 5 % 50 mL IVPB (has no administration in time range)    ED Course  I have reviewed the triage vital signs and the nursing notes.  Pertinent labs & imaging results that were available during my care of the patient were reviewed by me and considered in my medical decision making (see chart for details).    MDM Rules/Calculators/A&P                      Patient presenting for evaluation of right hip pain secondary to mechanical fall.  He is afebrile, blood pressure is somewhat labile in the ED.  Vital signs otherwise stable.  He is nontoxic in appearance.  No signs of serious head injury.  No midline spine tenderness.  He is not on any blood thinners.  He has a skin tear to the right upper arm which was repaired by EMS with Tegaderm.  Bleeding is controlled.  He is mostly complaining of right hip pain.  Radiographs show irregularity  concerning for nondisplaced fracture involving the greater trochanter.  CONSULT: Spoke with Dr. Aline Brochure with orthopedic surgery who reviewed the patient's films.  He recommends weightbearing as tolerated but if the patient is unable to bear weight or ambulate then he will likely require PT OT evaluation possible admission to the hospital.  Lab work reviewed and interpreted by myself shows no leukocytosis, stable anemia, renal insufficiency consistent with history of ESRD on dialysis.  Patient was able to stand but was unable to bear weight or ambulate in the ED.  He does endorse considerable pain with any attempts to ambulate. Family at the bedside can expresses concerns that he lives at home alone and has  stairs he must go up to get inside his home.  Spoke with Dr. Waldron Labs with [facility] hospitalist service who will admit the patient for further evaluation and management.  Patient was seen and evaluated by Dr. Roderic Palau who agrees with assessment and plan at this time.  Final Clinical Impression(s) / ED Diagnoses Final diagnoses:  Closed nondisplaced intertrochanteric fracture of left femur, initial encounter [facility])    Rx / [place] Orders ED Discharge Orders    None       Debroah Baller 10/04/19 2210    Milton Ferguson, MD 10/04/19 2310

## 2019-10-04 NOTE — Progress Notes (Signed)
Patient ID: [name], male   DOB: [birth year], 86 y.o.   MRN: [id number]   89 male had a bipolar prosthesis for proximal femoral neck fracture 12 years ago by Dr. Lorre Nick.  Dr. Lorre Nick was not able to be contacted because he would now be a new patient and those records are not available  He had a fall today.  He fractured his greater trochanter.  His stem shows pedestal formation at the end of the stem indicating that he has some good stability to the prosthesis  The fracture is a type VIII periprosthetic fracture and should do well with weightbearing as tolerated with support of a walker  If he does go home he can have follow-up in 2 weeks with xrays in 2 weeks

## 2019-10-05 ENCOUNTER — Observation Stay (HOSPITAL_COMMUNITY): Payer: Medicare Other

## 2019-10-05 ENCOUNTER — Encounter (HOSPITAL_COMMUNITY): Payer: Self-pay | Admitting: Internal Medicine

## 2019-10-05 DIAGNOSIS — N250 Renal osteodystrophy: Secondary | ICD-10-CM | POA: Diagnosis not present

## 2019-10-05 DIAGNOSIS — D631 Anemia in chronic kidney disease: Secondary | ICD-10-CM | POA: Diagnosis not present

## 2019-10-05 DIAGNOSIS — M79641 Pain in right hand: Secondary | ICD-10-CM | POA: Diagnosis not present

## 2019-10-05 DIAGNOSIS — Z992 Dependence on renal dialysis: Secondary | ICD-10-CM | POA: Diagnosis not present

## 2019-10-05 DIAGNOSIS — N186 End stage renal disease: Secondary | ICD-10-CM | POA: Diagnosis not present

## 2019-10-05 DIAGNOSIS — M9701XA Periprosthetic fracture around internal prosthetic right hip joint, initial encounter: Secondary | ICD-10-CM | POA: Diagnosis not present

## 2019-10-05 DIAGNOSIS — I120 Hypertensive chronic kidney disease with stage 5 chronic kidney disease or end stage renal disease: Secondary | ICD-10-CM | POA: Diagnosis not present

## 2019-10-05 DIAGNOSIS — S6991XA Unspecified injury of right wrist, hand and finger(s), initial encounter: Secondary | ICD-10-CM | POA: Diagnosis not present

## 2019-10-05 LAB — BASIC METABOLIC PANEL
Anion gap: 17 — ABNORMAL HIGH (ref 5–15)
BUN: 52 mg/dL — ABNORMAL HIGH (ref 8–23)
CO2: 22 mmol/L (ref 22–32)
Calcium: 7.2 mg/dL — ABNORMAL LOW (ref 8.9–10.3)
Chloride: 97 mmol/L — ABNORMAL LOW (ref 98–111)
Creatinine, Ser: 10.61 mg/dL — ABNORMAL HIGH (ref 0.61–1.24)
GFR calc Af Amer: 4 mL/min — ABNORMAL LOW (ref >60)
GFR calc non Af Amer: 4 mL/min — ABNORMAL LOW (ref >60)
Glucose, Bld: 106 mg/dL — ABNORMAL HIGH (ref 70–99)
Potassium: 4 mmol/L (ref 3.5–5.1)
Sodium: 136 mmol/L (ref 135–145)

## 2019-10-05 LAB — CBC
HCT: 30.7 % — ABNORMAL LOW (ref 39.0–52.0)
Hemoglobin: 9.8 g/dL — ABNORMAL LOW (ref 13.0–17.0)
MCH: 32.5 pg (ref 26.0–34.0)
MCHC: 31.9 g/dL (ref 30.0–36.0)
MCV: 101.7 fL — ABNORMAL HIGH (ref 80.0–100.0)
Platelets: 112 10*3/uL — ABNORMAL LOW (ref 150–400)
RBC: 3.02 MIL/uL — ABNORMAL LOW (ref 4.22–5.81)
RDW: 13.5 % (ref 11.5–15.5)
WBC: 4.9 10*3/uL (ref 4.0–10.5)
nRBC: 0 % (ref 0.0–0.2)

## 2019-10-05 MED ORDER — OXYCODONE HCL 5 MG PO TABS: 5.0000 mg | ORAL_TABLET | Freq: Four times a day (QID) | ORAL | 0 refills | Status: DC | PRN

## 2019-10-05 MED ORDER — LIDOCAINE HCL (PF) 1 % IJ SOLN: 5.0000 mL | INTRAMUSCULAR | Status: DC | PRN

## 2019-10-05 MED ORDER — CHLORHEXIDINE GLUCONATE CLOTH 2 % EX PADS
6.0000 | MEDICATED_PAD | Freq: Every day | CUTANEOUS | Status: DC
  Administered 2019-10-06: 6 via TOPICAL

## 2019-10-05 MED ORDER — PENTAFLUOROPROP-TETRAFLUOROETH EX AERO: 1.0000 "application " | INHALATION_SPRAY | CUTANEOUS | Status: DC | PRN

## 2019-10-05 MED ORDER — SODIUM CHLORIDE 0.9 % IV SOLN: 100.0000 mL | INTRAVENOUS | Status: DC | PRN

## 2019-10-05 MED ORDER — LIDOCAINE-PRILOCAINE 2.5-2.5 % EX CREA: 1.0000 "application " | TOPICAL_CREAM | CUTANEOUS | Status: DC | PRN

## 2019-10-05 MED ORDER — ACETAMINOPHEN 325 MG PO TABS: 650.0000 mg | ORAL_TABLET | Freq: Four times a day (QID) | ORAL | Status: AC | PRN

## 2019-10-05 NOTE — Progress Notes (Signed)
[name] requires short term rehab due to periprosthetic fracture. He does not meet Medicare qualifying 3 night stay, but needs therapy at SNF prior to returning home.

## 2019-10-05 NOTE — Plan of Care (Signed)
?  Problem: Education: ?Goal: Knowledge of General Education information will improve ?Description: Including pain rating scale, medication(s)/side effects and non-pharmacologic comfort measures ?Outcome: Progressing ?  ?Problem: Health Behavior/Discharge Planning: ?Goal: Ability to manage health-related needs will improve ?Outcome: Progressing ?  ?Problem: Clinical Measurements: ?Goal: Ability to maintain clinical measurements within normal limits will improve ?Outcome: Progressing ?Goal: Will remain free from infection ?Outcome: Progressing ?Goal: Respiratory complications will improve ?Outcome: Progressing ?Goal: Cardiovascular complication will be avoided ?Outcome: Progressing ?  ?Problem: Activity: ?Goal: Risk for activity intolerance will decrease ?Outcome: Progressing ?  ?Problem: Coping: ?Goal: Level of anxiety will decrease ?Outcome: Progressing ?  ?Problem: Elimination: ?Goal: Will not experience complications related to bowel motility ?Outcome: Progressing ?Goal: Will not experience complications related to urinary retention ?Outcome: Progressing ?  ?Problem: Pain Managment: ?Goal: General experience of comfort will improve ?Outcome: Progressing ?  ?Problem: Safety: ?Goal: Ability to remain free from injury will improve ?Outcome: Progressing ?  ?Problem: Skin Integrity: ?Goal: Risk for impaired skin integrity will decrease ?Outcome: Progressing ?  ?

## 2019-10-05 NOTE — Evaluation (Signed)
Occupational Therapy Evaluation Patient Details Name: [name] MRN: [id number] DOB: 1930-11-13 Today's Date: 10/05/2019    History of Present Illness [name]  is a 86 y.o. male, ESRD on Monday Wednesday Friday schedule, COPD, GERD, hypertension, patient presents to ED secondary to right hip pain s/p fall, and with history of right hip fracture, status post partial knee replacement 12 years ago by Dr. Lorre Nick, patient had a mechanical fall at home, when he was climbing the stairs, not using his cane, so he had a misstep fell onto the right side and he had instant right hip pain, he denies any head trauma, dizziness, lightheadedness, he is not on any blood thinners, no loss of consciousness, focal deficits or vision change, patient had severely uncontrolled pain which prompted him to come to ED, his ESRD, on Monday Wednesday Friday schedule, as well patient recently had right bone biopsy for metastatic disease, biopsy came back significant for renal cancer, had recent PET scan with does show iliac and renal metastasis, patient used to follow with [facility] oncology, but currently he has been referred to Dr. Benay Spice in [place] which she has not seen yet.   Clinical Impression   Pt agreeable to OT/PT co-evaluation, pt limiting in task completion due to RLE and right wrist pain and weakness. Pt able to perform seated tasks with set-up, using mostly LUE today. Pt performing transfer task using RW and trialing platform walker. More successful with platform walker that limits need to weightbear through wrist and hand. Recommend SNF on discharge to improve safety and independence in ADL completion. No further acute OT services required at this time.     Follow Up Recommendations  SNF    Equipment Recommendations  None recommended by OT       Precautions / Restrictions Precautions Precautions: Fall Restrictions Weight Bearing Restrictions: Yes RLE Weight Bearing: Weight bearing as tolerated       Mobility Bed Mobility Overal bed mobility: Needs Assistance Bed Mobility: Supine to Sit     Supine to sit: Min assist;HOB elevated     General bed mobility comments: slow, labored transition to seated EOB, assist to upright trunk and advance RLE  Transfers Overall transfer level: Needs assistance Equipment used: Rolling walker (2 wheeled);Right platform walker Transfers: Sit to/from Stand Sit to Stand: Mod assist         General transfer comment: requires mod physical assist to transition to standing with RW, difficulty gripping RW due to R hand/wrist pain; transfer to standing requiring mod assist with R platform walker        ADL either performed or assessed with clinical judgement   ADL Overall ADL's : Needs assistance/impaired     Grooming: Set up;Sitting Grooming Details (indicate cue type and reason): pt unable to perform tasks in standing due to RLE pain and weakness         Upper Body Dressing : Modified independent;Sitting   Lower Body Dressing: Minimal assistance;Sitting/lateral leans   Toilet Transfer: Moderate assistance;Stand-pivot;BSC;RW Toilet Transfer Details (indicate cue type and reason): pt performing stand-pivot transfer using platform walker to support RUE         Functional mobility during ADLs: Moderate assistance;Rolling walker General ADL Comments: pt requiring increased level of assistance due to RLE pain and weakness, as well as RUE pain limiting use as dominant extremity     Vision Baseline Vision/History: Wears glasses Wears Glasses: At all times Patient Visual Report: No change from baseline  Pertinent Vitals/Pain Pain Assessment: 0-10 Pain Score: 3  Pain Location: wrist, 0/10 hip in supine with increased pain in weightbearing Pain Descriptors / Indicators: Discomfort;Grimacing Pain Intervention(s): Limited activity within patient's tolerance;Monitored during session;Repositioned     Hand Dominance Right    Extremity/Trunk Assessment Upper Extremity Assessment Upper Extremity Assessment: RUE deficits/detail RUE Deficits / Details: right wrist pain limiting functional use. P/ROM is functional, pt able to perform minimal wrist flexion and extension  RUE: Unable to fully assess due to pain RUE Sensation: WNL RUE Coordination: WNL   Lower Extremity Assessment Lower Extremity Assessment: Defer to PT evaluation RLE Deficits / Details: limited by R hip pain RLE: Unable to fully assess due to pain       Communication Communication Communication: No difficulties   Cognition Arousal/Alertness: Awake/alert Behavior During Therapy: WFL for tasks assessed/performed Overall Cognitive Status: Within Functional Limits for tasks assessed                                                Home Living Family/patient expects to be discharged to:: Private residence Living Arrangements: Alone   Type of Home: House Home Access: Stairs to enter [employer] of Steps: 3 Entrance Stairs-Rails: Left Home Layout: One level     Bathroom Shower/Tub: Occupational psychologist: Standard Bathroom Accessibility: Yes   Home Equipment: Environmental consultant - 2 wheels;Cane - single point          Prior Functioning/Environment Level of Independence: Independent        Comments: Patient states independent with ADL and community ambulator with SPC mostly        OT Problem List: Decreased strength;Decreased activity tolerance;Impaired balance (sitting and/or standing);Decreased knowledge of use of DME or AE;Impaired UE functional use;Pain      OT Treatment/Interventions:      OT Goals(Current goals can be found in the care plan section) Acute Rehab OT Goals Patient Stated Goal: go to rehab and go home  OT Frequency:             Co-evaluation PT/OT/SLP Co-Evaluation/Treatment: Yes Reason for Co-Treatment: Complexity of the patient's impairments (multi-system involvement);To  address functional/ADL transfers PT goals addressed during session: Mobility/safety with mobility;Proper use of DME;Strengthening/ROM OT goals addressed during session: ADL's and self-care;Proper use of Adaptive equipment and DME      AM-PAC OT "6 Clicks" Daily Activity     Outcome Measure Help from another person eating meals?: A Little Help from another person taking care of personal grooming?: A Little Help from another person toileting, which includes using toliet, bedpan, or urinal?: A Lot Help from another person bathing (including washing, rinsing, drying)?: A Lot Help from another person to put on and taking off regular upper body clothing?: A Little Help from another person to put on and taking off regular lower body clothing?: A Lot 6 Click Score: 15   End of Session Equipment Utilized During Treatment: Gait belt;Rolling walker Nurse Communication: Mobility status;Other (comment)(right wrist pain)  Activity Tolerance: Patient tolerated treatment well Patient left: in chair;with call bell/phone within reach;with chair alarm set  OT Visit Diagnosis: Repeated falls (R29.6);Muscle weakness (generalized) (M62.81);Pain Pain - Right/Left: Right Pain - part of body: Hip(wrist)                Time: 2992-4268 OT Time Calculation (min): 23 min Charges:  OT General  Charges $OT Visit: 1 Visit OT Evaluation $OT Eval Low Complexity: [place], OTR/L  [phone] 10/05/2019, 10:10 AM

## 2019-10-05 NOTE — Plan of Care (Signed)
  Problem: Acute Rehab PT Goals(only PT should resolve) Goal: Pt Will Go Supine/Side To Sit Outcome: Progressing Flowsheets (Taken 10/05/2019 0848) Pt will go Supine/Side to Sit: with supervision Goal: Pt Will Go Sit To Supine/Side Outcome: Progressing Flowsheets (Taken 10/05/2019 0848) Pt will go Sit to Supine/Side: with supervision Goal: Patient Will Transfer Sit To/From Stand Outcome: Progressing Flowsheets (Taken 10/05/2019 0848) Patient will transfer sit to/from stand: with minimal assist Goal: Pt Will Transfer Bed To Chair/Chair To Bed Outcome: Progressing Flowsheets (Taken 10/05/2019 0848) Pt will Transfer Bed to Chair/Chair to Bed: with min assist Goal: Pt Will Ambulate Outcome: Progressing Flowsheets (Taken 10/05/2019 0848) Pt will Ambulate:  25 feet  with minimal assist  with least restrictive assistive device Goal: Pt/caregiver will Perform Home Exercise Program Outcome: Progressing Flowsheets (Taken 10/05/2019 0848) Pt/caregiver will Perform Home Exercise Program:  For increased ROM  For increased strengthening  For improved balance  Independently   8:49 AM, 10/05/19 Mearl Latin PT, DPT Physical Therapist at [facility]

## 2019-10-05 NOTE — Discharge Summary (Signed)
Physician Discharge Summary  [name] EHU:[id number] DOB: 1930-06-01 DOA: 10/04/2019  PCP: Sharilyn Sites, MD  Admit date: 10/04/2019 Discharge date: 10/06/2019  Admitted From: Home Disposition: Skilled nursing facility  Recommendations for Outpatient Follow-up:  1. Follow up with PCP in 1-2 weeks 2. Please obtain BMP/CBC in one week 3. Follow-up with Dr. Aline Brochure in 2 weeks with x-rays of right hip  Home Health: Equipment/Devices:  Discharge Condition: Stable CODE STATUS: DNR Diet recommendation: Heart healthy  Brief/Interim Summary: 86 year old male, history of end-stage renal disease on hemodialysis, Monday Wednesday Friday, COPD, presented to the emergency room after having a fall.  He complained of right hip pain.  He status post partial knee replacement 12 years ago by Dr. Lorre Nick.  Patient had a mechanical fall.  He was found to have a periprosthetic right hip fracture. Images reviewed by Ortho Dr. Aline Brochure who felt that management would be non operative and recommended follow up in 2 weeks. He was evaluated by physical therapy who recommended SNF placement. He was seen by nephrology and underwent dialysis on 5/28. His next scheduled session will be on 5/31. Remainder of his medical problems are stable.   Discharge Diagnoses:  Active Problems:   End stage renal disease (HCC)   Malignant neoplasm of right upper lobe of lung (HCC)   Pulmonary nodules/lesions, multiple   COPD (chronic obstructive pulmonary disease) (HCC)   Hypertension   GERD (gastroesophageal reflux disease)   Renal cancer ([place])   Periprosthetic fracture around internal prosthetic right hip joint [facility])    Discharge Instructions   Allergies as of 10/05/2019   No Known Allergies     Medication List    TAKE these medications   acetaminophen 325 MG tablet Commonly known as: TYLENOL Take 2 tablets (650 mg total) by mouth every 6 (six) hours as needed for mild pain or moderate pain.   cinacalcet 60 MG  tablet Commonly known as: SENSIPAR Take 60 mg by mouth daily.   Fluticasone-Salmeterol 100-50 MCG/DOSE Aepb Commonly known as: ADVAIR Inhale 1 puff into the lungs in the morning and at bedtime.   lidocaine-prilocaine cream Commonly known as: EMLA Apply 1 application topically every Monday, Wednesday, and Friday with hemodialysis.   metoprolol succinate 25 MG 24 hr tablet Commonly known as: TOPROL-XL Take 25 mg by mouth daily.   omeprazole 20 MG capsule Commonly known as: PRILOSEC Take 20 mg by mouth daily.   oxyCODONE 5 MG immediate release tablet Commonly known as: Oxy IR/ROXICODONE Take 1 tablet (5 mg total) by mouth every 6 (six) hours as needed for severe pain.   sevelamer carbonate 800 MG tablet Commonly known as: RENVELA Take 800 mg by mouth 3 (three) times daily with meals.   Ventolin HFA 108 (90 Base) MCG/ACT inhaler Generic drug: albuterol Inhale 2 puffs into the lungs every 4 (four) hours as needed for wheezing or shortness of breath.       No Known Allergies  Consultations:  Orthopedics  Nephrology   Procedures/Studies: NM PET Image Restag (PS) Skull Base To Thigh  Result Date: 09/18/2019 CLINICAL DATA:  Subsequent treatment strategy for renal cell carcinoma and lung cancer. EXAM: NUCLEAR MEDICINE PET SKULL BASE TO THIGH TECHNIQUE: 9.75 mCi F-18 FDG was injected intravenously. Full-ring PET imaging was performed from the skull base to thigh after the radiotracer. CT data was obtained and used for attenuation correction and anatomic localization. Fasting blood glucose: 126 mg/dl COMPARISON:  CT chest dated 08/28/2019 and multiple prior studies. FINDINGS: Mediastinal blood pool  activity: SUV max 3.07 Liver activity: SUV max NA NECK: No hypermetabolic lymph nodes in the neck. Incidental CT findings: Atheromatous plaque in the carotid arteries bilaterally. CHEST: Enlarged RIGHT paratracheal lymph node (image 65, series 4) 12 mm short axis with intense FDG uptake  (SUVmax = 14.4 no additional adenopathy with increased metabolic activity. Post radiation changes in the RIGHT upper lobe similar to the prior exam. Evidence of numerous pulmonary nodules in the chest without change in size or distribution. Largest in the LEFT lower lobe (image 48, series 8) 13 mm greatest axial dimension (SUVmax = 2.6) In the medial RIGHT lung base and 11 mm nodule (image 45, series 8) (SUVmax = 1.0 nodule at the RIGHT lung base (image 64, series 8) 12 mm) (SUVmax = 2.5) there is some misregistration due to motion artifact in this location.) . Incidental CT findings: Calcified atheromatous plaque in the thoracic aorta. Calcified coronary artery disease. Central pulmonary vasculature is of normal caliber ABDOMEN/PELVIS: No abnormal hypermetabolic activity within the liver, pancreas, adrenal glands, or spleen. No hypermetabolic lymph nodes in the abdomen or pelvis. Incidental CT findings: Lobular hepatic contours. Post cholecystectomy. Pancreas is unremarkable. Spleen is normal size without visible abnormality. Adrenal glands are normal. Post RIGHT nephrectomy. No signs of nodularity in the nephrectomy bed. Marked atrophy of the LEFT kidney with numerous areas of low attenuation most likely cysts without increased FDG uptake. No hydronephrosis. Extensive colonic diverticulosis. Focal area of uptake along the RIGHT: Appears to correspond with a diverticulum there is misregistration due to respiratory motion. There is another area in the central abdomen adjacent to small bowel loops and the colon without visible abnormality within these under distended bowel loops. Calcified atheromatous plaque in the abdominal aorta. Marked prostatomegaly. SKELETON: Intense FDG uptake associated with a lytic focus in the RIGHT iliac crest (image 158, series 4) area measures 1.5 x 0.8 cm (SUVmax = 6.1) FDG uptake in posterior elements at L5-S1 likely related to spondylolysis and listhesis at this level in the setting  of degenerative change. Incidental CT findings: Extensive spinal degenerative changes worse at L5-S1 as described. RIGHT hip arthroplasty. Glenohumeral degenerative changes worse on the RIGHT IMPRESSION: 1. Multifocal pulmonary metastatic lesions in the chest that display less FDG uptake than the mediastinal lymph node which shows marked FDG uptake. 2. Post treatment changes in the RIGHT upper lobe with increased metabolic activity as well, not to the same extent as mediastinal lymph node outlined above. FDG uptake is less than blood pool in this area. 3. Intense metabolic activity associated with a destructive lesion in the RIGHT iliac crest compatible with metastatic focus. 4. Areas of increased FDG uptake along the colon potentially related to mild inflammation in the setting of diverticular disease misregistration hampers assessment. Attention on follow-up and correlation with any symptoms is suggested. 5. Signs of RIGHT nephrectomy and cholecystectomy. 6. Marked spinal degenerative changes. Aortic Atherosclerosis (ICD10-I70.0). Electronically Signed   By: Zetta Bills M.D.   On: 09/18/2019 10:02   CT BIOPSY  Result Date: 10/02/2019 INDICATION: History of lung and renal cell carcinoma, now with indeterminate hypermetabolic lesion involving the anterior aspect of the right ilium. Please perform CT-guided biopsy for tissue diagnostic purposes. EXAM: CT-GUIDED BONE LESION BIOPSY MEDICATIONS: None ANESTHESIA/SEDATION: Moderate (conscious) sedation was employed during this procedure. A total of Versed 1 mg and Fentanyl 50 mcg was administered intravenously. Moderate Sedation Time: 15 minutes. The patient's level of consciousness and vital signs were monitored continuously by radiology nursing throughout the procedure under  my direct supervision. COMPLICATIONS: None immediate. PROCEDURE: Informed consent was obtained from the patient following an explanation of the procedure, risks, benefits and alternatives.  The patient understands, agrees and consents for the procedure. All questions were addressed. A time out was performed prior to the initiation of the procedure. The patient was positioned supine and non-contrast localization CT was performed of the pelvis demonstrating an approximate 2.0 x 1.2 cm lytic lesion involving the anterior aspect of the right ilium (image 29, series 2). Under sterile conditions and local anesthesia, a 22 gauge spinal needle was utilized for procedural planning. Next, an 11 gauge coaxial bone biopsy needle was advanced into the peripheral aspect of the ill-defined lytic lesion. Needle position was confirmed with CT imaging. Next, the inner 13 gauge bone biopsy needle was utilized to acquire 3 core needle biopsies. Appropriate needle positioning was confirmed with CT imaging. Finally, the 11 gauge coaxial bone biopsy needle utilized to acquire a final sample. The needle was removed and superficial hemostasis was obtained with manual compression. A dressing was applied. The patient tolerated the procedure well without immediate post procedural complication. IMPRESSION: Successful CT guided biopsy of ill-defined lytic lesion involving the anterior aspect of the right ilium. Electronically Signed   By: Sandi Mariscal M.D.   On: 10/02/2019 14:15   DG Hand Complete Right  Result Date: 10/05/2019 CLINICAL DATA:  Right in pain.  Fell. EXAM: RIGHT HAND - COMPLETE 3+ VIEW COMPARISON:  None. FINDINGS: Moderate age related degenerative changes. Moderate chondrocalcinosis is noted at the wrist joint. No definite acute fractures. IMPRESSION: 1. Moderate age related degenerative changes and chondrocalcinosis. 2. No definite acute fracture. Electronically Signed   By: Marijo Sanes M.D.   On: 10/05/2019 16:05   DG Hip Unilat With Pelvis 2-3 Views Right  Result Date: 10/04/2019 CLINICAL DATA:  Pain status post fall EXAM: DG HIP (WITH OR WITHOUT PELVIS) 2-3V RIGHT COMPARISON:  03/09/2019 FINDINGS: There  is a new cortical step-off involving the lateral cortex of the proximal right femur. The patient is status post total hip arthroplasty. There is osteopenia. There is no dislocation. The hardware is intact. IMPRESSION: New cortical step-off involving the lateral aspect of the proximal femoral cortex is concerning for a nondisplaced fracture. Electronically Signed   By: Constance Holster M.D.   On: 10/04/2019 15:21       Subjective: Pain is reasonably controlled at rest.  Discharge Exam: Vitals:   10/04/19 2240 10/05/19 0308 10/05/19 0820 10/05/19 1420  BP: (!) 152/62 134/62  (!) 157/69  Pulse: 74 68  79  Resp: 16 16  20   Temp: 98.6 F (37 C) 98 F (36.7 C)  97.8 F (36.6 C)  TempSrc: Oral Oral  Oral  SpO2: 100% 96% 98% 100%  Weight:      Height:        General: Pt is alert, awake, not in acute distress Cardiovascular: RRR, S1/S2 +, no rubs, no gallops Respiratory: CTA bilaterally, no wheezing, no rhonchi Abdominal: Soft, NT, ND, bowel sounds + Extremities: no edema, no cyanosis    The results of significant diagnostics from this hospitalization (including imaging, microbiology, ancillary and laboratory) are listed below for reference.     Microbiology: Recent Results (from the past 240 hour(s))  SARS Coronavirus 2 by RT PCR (hospital order, performed in [facility] hospital lab) Nasopharyngeal Nasopharyngeal Swab     Status: None   Collection Time: 10/04/19  7:13 PM   Specimen: Nasopharyngeal Swab  Result Value Ref Range  Status   SARS Coronavirus 2 NEGATIVE NEGATIVE Final    Comment: (NOTE) SARS-CoV-2 target nucleic acids are NOT DETECTED. The SARS-CoV-2 RNA is generally detectable in upper and lower respiratory specimens during the acute phase of infection. The lowest concentration of SARS-CoV-2 viral copies this assay can detect is 250 copies / mL. A negative result does not preclude SARS-CoV-2 infection and should not be used as the sole basis for treatment or  other patient management decisions.  A negative result may occur with improper specimen collection / handling, submission of specimen other than nasopharyngeal swab, presence of viral mutation(s) within the areas targeted by this assay, and inadequate number of viral copies (<250 copies / mL). A negative result must be combined with clinical observations, patient history, and epidemiological information. Fact Sheet for Patients:   StrictlyIdeas.no Fact Sheet for Healthcare Providers: BankingDealers.co.za This test is not yet approved or cleared  by the [place] FDA and has been authorized for detection and/or diagnosis of SARS-CoV-2 by FDA under an Emergency Use Authorization (EUA).  This EUA will remain in effect (meaning this test can be used) for the duration of the COVID-19 declaration under Section 564(b)(1) of the Act, 21 U.S.C. section 360bbb-3(b)(1), unless the authorization is terminated or revoked sooner. Performed at [facility], [address], [place], [place] [address]      Labs: BNP (last 3 results) No results for input(s): BNP in the last 8760 hours. Basic Metabolic Panel: Recent Labs  Lab 10/04/19 1812 10/05/19 0502  NA 134* 136  K 4.0 4.0  CL 98 97*  CO2 22 22  GLUCOSE 117* 106*  BUN 46* 52*  CREATININE 9.88* 10.61*  CALCIUM 7.5* 7.2*   Liver Function Tests: No results for input(s): AST, ALT, ALKPHOS, BILITOT, PROT, ALBUMIN in the last 168 hours. No results for input(s): LIPASE, AMYLASE in the last 168 hours. No results for input(s): AMMONIA in the last 168 hours. CBC: Recent Labs  Lab 10/02/19 1040 10/04/19 1812 10/05/19 0502  WBC 5.2 7.0 4.9  NEUTROABS  --  5.8  --   HGB 11.8* 10.6* 9.8*  HCT 37.0* 33.2* 30.7*  MCV 102.8* 103.8* 101.7*  PLT 134* 125* 112*   Cardiac Enzymes: No results for input(s): CKTOTAL, CKMB, CKMBINDEX, TROPONINI in the last 168 hours. BNP: Invalid input(s):  POCBNP CBG: No results for input(s): GLUCAP in the last 168 hours. D-Dimer No results for input(s): DDIMER in the last 72 hours. Hgb A1c No results for input(s): HGBA1C in the last 72 hours. Lipid Profile No results for input(s): CHOL, HDL, LDLCALC, TRIG, CHOLHDL, LDLDIRECT in the last 72 hours. Thyroid function studies No results for input(s): TSH, T4TOTAL, T3FREE, THYROIDAB in the last 72 hours.  Invalid input(s): FREET3 Anemia work up No results for input(s): VITAMINB12, FOLATE, FERRITIN, TIBC, IRON, RETICCTPCT in the last 72 hours. Urinalysis    Component Value Date/Time   COLORURINE YELLOW 01/01/2009 1054   APPEARANCEUR CLEAR 01/01/2009 1054   LABSPEC 1.018 01/01/2009 1054   PHURINE 5.0 01/01/2009 1054   GLUCOSEU NEGATIVE 01/01/2009 1054   HGBUR NEGATIVE 01/01/2009 1054   BILIRUBINUR NEGATIVE 01/01/2009 1054   [place] 01/01/2009 1054   PROTEINUR 100 (A) 01/01/2009 1054   UROBILINOGEN 0.2 01/01/2009 1054   NITRITE NEGATIVE 01/01/2009 1054   LEUKOCYTESUR NEGATIVE 01/01/2009 1054   Sepsis Labs Invalid input(s): PROCALCITONIN,  WBC,  LACTICIDVEN Microbiology Recent Results (from the past 240 hour(s))  SARS Coronavirus 2 by RT PCR (hospital order, performed in [place]  hospital lab) Nasopharyngeal Nasopharyngeal Swab     Status: None   Collection Time: 10/04/19  7:13 PM   Specimen: Nasopharyngeal Swab  Result Value Ref Range Status   SARS Coronavirus 2 NEGATIVE NEGATIVE Final    Comment: (NOTE) SARS-CoV-2 target nucleic acids are NOT DETECTED. The SARS-CoV-2 RNA is generally detectable in upper and lower respiratory specimens during the acute phase of infection. The lowest concentration of SARS-CoV-2 viral copies this assay can detect is 250 copies / mL. A negative result does not preclude SARS-CoV-2 infection and should not be used as the sole basis for treatment or other patient management decisions.  A negative result may occur with improper specimen  collection / handling, submission of specimen other than nasopharyngeal swab, presence of viral mutation(s) within the areas targeted by this assay, and inadequate number of viral copies (<250 copies / mL). A negative result must be combined with clinical observations, patient history, and epidemiological information. Fact Sheet for Patients:   StrictlyIdeas.no Fact Sheet for Healthcare Providers: BankingDealers.co.za This test is not yet approved or cleared  by the [place] FDA and has been authorized for detection and/or diagnosis of SARS-CoV-2 by FDA under an Emergency Use Authorization (EUA).  This EUA will remain in effect (meaning this test can be used) for the duration of the COVID-19 declaration under Section 564(b)(1) of the Act, 21 U.S.C. section 360bbb-3(b)(1), unless the authorization is terminated or revoked sooner. Performed at [facility], [address] [place], [place] [address]      Time coordinating discharge: 46mins  SIGNED:   Kathie Dike, MD  [facility] 10/05/2019, 8:34 PM   If 7PM-7AM, please contact night-coverage [email or web address]

## 2019-10-05 NOTE — Procedures (Signed)
     HEMODIALYSIS TREATMENT NOTE:  3.5 hour heparin-free HD completed via left upper arm AVF (15g/antegrade). Goal met: 2 liters removed without interruption in UF.  All blood was returned and hemostasis was achieved in 15 minutes.  No changes from pre-HD assessment.   , RN 

## 2019-10-05 NOTE — Evaluation (Signed)
Physical Therapy Evaluation Patient Details Name: [name] MRN: [id number] DOB: [birth year] Today's Date: 10/05/2019   History of Present Illness  [name]  is a 86 y.o. male, ESRD on Monday Wednesday Friday schedule, COPD, GERD, hypertension, patient presents to ED secondary to right hip pain s/p fall, and with history of right hip fracture, status post partial knee replacement 12 years ago by Dr. Lorre Nick, patient had a mechanical fall at home, when he was climbing the stairs, not using his cane, so he had a misstep fell onto the right side and he had instant right hip pain, he denies any head trauma, dizziness, lightheadedness, he is not on any blood thinners, no loss of consciousness, focal deficits or vision change, patient had severely uncontrolled pain which prompted him to come to ED, his ESRD, on Monday Wednesday Friday schedule, as well patient recently had right bone biopsy for metastatic disease, biopsy came back significant for renal cancer, had recent PET scan with does show iliac and renal metastasis, patient used to follow with [facility], but currently he has been referred to Dr. Benay Spice in [place] which she has not seen yet.    Clinical Impression  Patient limited for functional mobility as stated below secondary to pain, BLE weakness, fatigue and poor standing balance. He has c/o severe R hand/wrist pain throughout session. He requires min assist for bed mobility with HOB elevated and requires min assist for advancing RLE. He requires mod assist to tranfer to standing with both RW and R platform walker secondary to limited RLE use. He ambulates several shuffled antalgic steps at bedside with AD. Patient tolerates sitting in chair at bedside at end of session. RN made aware of wrist pain. Patient will benefit from continued physical therapy in hospital and recommended venue below to increase strength, balance, endurance for safe ADLs and gait.     Follow Up Recommendations  SNF    Equipment Recommendations  None recommended by PT    Recommendations for Other Services       Precautions / Restrictions Precautions Precautions: Fall Restrictions Weight Bearing Restrictions: Yes RLE Weight Bearing: Weight bearing as tolerated      Mobility  Bed Mobility Overal bed mobility: Needs Assistance Bed Mobility: Supine to Sit     Supine to sit: Min assist;HOB elevated     General bed mobility comments: slow, labored transition to seated EOB, assist to upright trunk and advance RLE  Transfers Overall transfer level: Needs assistance Equipment used: Rolling walker (2 wheeled);Right platform walker Transfers: Sit to/from Stand Sit to Stand: Mod assist         General transfer comment: requires mod physical assist to transition to standing with RW, difficulty gripping RW due to R hand/wrist pain; transfer to standing requiring mod assist with R platform walker  Ambulation/Gait Ambulation/Gait assistance: Mod assist Gait Distance (Feet): 5 Feet Assistive device: Rolling walker (2 wheeled);Right platform walker Gait Pattern/deviations: Decreased step length - right;Decreased step length - left;Decreased stride length;Shuffle;Antalgic Gait velocity: decreased   General Gait Details: slow, labored steps, limited weight bearing on RLE secondary to pain, antalgic shuffled gait with RW and platform walker  Stairs            Wheelchair Mobility    Modified Rankin (Stroke Patients Only)       Balance Overall balance assessment: Needs assistance Sitting-balance support: Feet supported Sitting balance-Leahy Scale: Good Sitting balance - Comments: seated EOB     Standing balance-Leahy Scale: Poor Standing balance comment:  requires RW/platform walker and assist                             Pertinent Vitals/Pain Pain Assessment: 0-10 Pain Score: 3  Pain Location: wrist, 0/10 hip in supine with increased pain in weightbearing Pain  Intervention(s): Limited activity within patient's tolerance;Monitored during session    [place] expects to be discharged to:: Private residence Living Arrangements: Alone   Type of Home: House Home Access: Stairs to enter Entrance Stairs-Rails: Left Entrance Stairs-Number of Steps: 3 Home Layout: One level Home Equipment: Walker - 2 wheels;Cane - single point      Prior Function Level of Independence: Independent         Comments: Patient states independent with ADL and community ambulator with [facility] mostly     Hand Dominance        Extremity/Trunk Assessment   Upper Extremity Assessment Upper Extremity Assessment: Defer to OT evaluation    Lower Extremity Assessment Lower Extremity Assessment: RLE deficits/detail RLE Deficits / Details: limited by R hip pain RLE: Unable to fully assess due to pain       Communication   Communication: No difficulties  Cognition Arousal/Alertness: Awake/alert Behavior During Therapy: WFL for tasks assessed/performed Overall Cognitive Status: Within Functional Limits for tasks assessed                                        General Comments      Exercises     Assessment/Plan    PT Assessment Patient needs continued PT services  PT Problem List Decreased strength;Decreased mobility;Decreased range of motion;Decreased activity tolerance;Decreased balance;Decreased knowledge of use of DME;Pain       PT Treatment Interventions DME instruction;Therapeutic exercise;Gait training;Balance training;Stair training;Neuromuscular re-education;Functional mobility training;Therapeutic activities;Patient/family education    PT Goals (Current goals can be found in the Care Plan section)  Acute Rehab PT Goals Patient Stated Goal: go to rehab and go home PT Goal Formulation: With patient Time For Goal Achievement: 10/19/19 Potential to Achieve Goals: Good    Frequency Min 4X/week   Barriers to  discharge        Co-evaluation PT/OT/SLP Co-Evaluation/Treatment: Yes Reason for Co-Treatment: For patient/therapist safety;To address functional/ADL transfers PT goals addressed during session: Mobility/safety with mobility;Proper use of DME;Strengthening/ROM         AM-PAC PT "6 Clicks" Mobility  Outcome Measure Help needed turning from your back to your side while in a flat bed without using bedrails?: None Help needed moving from lying on your back to sitting on the side of a flat bed without using bedrails?: A Little Help needed moving to and from a bed to a chair (including a wheelchair)?: A Lot Help needed standing up from a chair using your arms (e.g., wheelchair or bedside chair)?: A Lot Help needed to walk in hospital room?: A Lot Help needed climbing 3-5 steps with a railing? : Total 6 Click Score: 14    End of Session Equipment Utilized During Treatment: Gait belt Activity Tolerance: Patient tolerated treatment well;Patient limited by pain Patient left: in chair;with call bell/phone within reach;with chair alarm set Nurse Communication: Mobility status PT Visit Diagnosis: Other abnormalities of gait and mobility (R26.89);Unsteadiness on feet (R26.81);History of falling (Z91.81);Muscle weakness (generalized) (M62.81)    Time: 7893-8101 PT Time Calculation (min) (ACUTE ONLY): 21 min   Charges:  PT Evaluation $PT Eval Moderate Complexity: 1 Mod          8:46 AM, 10/05/19 Mearl Latin PT, DPT Physical Therapist at [facility]

## 2019-10-05 NOTE — TOC Progression Note (Signed)
Transition of Care [facility]) - Progression Note    Patient Details  Name: [name] MRN: [id number] Date of Birth: Feb 02, 1931  Transition of Care [facility]) CM/SW Contact  Salome Arnt, [place] Phone Number: 10/05/2019, 2:39 PM  Clinical Narrative:   Bed accepted at [facility] by pt's niece, [name]. Per [name] at [place], [place] waiver of 3 night qualifying stay is still in effect. Discussed with niece. Anticipate d/c tomorrow per MD. Facility aware.     Expected Discharge Plan: [place] Barriers to Discharge: Continued Medical Work up  Expected Discharge Plan and Services Expected Discharge Plan: [place] In-house Referral: Clinical Social Work   Post Acute Care Choice: [place] Living arrangements for the past 2 months: Single Family Home Expected Discharge Date: 10/05/19                                     Social Determinants of Health (SDOH) Interventions    Readmission Risk Interventions No flowsheet data found.

## 2019-10-05 NOTE — Consult Note (Addendum)
[facility] Nephrology consult note Reason for Consult: To manage dialysis and dialysis related needs Referring Physician: Dr. Roderic Palau  HPI:  [name] is an 86 y.o. 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 He goes to dialysis Monday Wednesday Friday at [facility].  Reportedly the last dialysis was on Wednesday for 3 and half hours.  He has left upper extremity for the fistula.  He does not remember the dry weight. He presented to the ER because of right hip pain after the fall while trying to climb the stairs.  He was not using cane.  Denies head trauma or loss of consciousness. He recently had arrived hip bone biopsy for metastatic disease.  Reportedly the biopsy came back positive for renal cancer.  He follows with oncologist. In the ER the x-ray showed evidence of right hip nondisplaced fracture.  The orthopedics was consulted recommended no surgical intervention however managed conservatively with weightbearing as tolerated.  He is now admitted for pain management and rehabilitation.  He is on room air and blood pressure in acceptable range.  The labs showed potassium 4.0, hemoglobin 9.8.   Dialyzes at [facility] access left upper extremity AV fistula.  Past Medical History:  Diagnosis Date  . Arthritis    ankle , hip, knees , low back   . BPH (benign prostatic hypertrophy)   . Cancer ([place])    lung  . Cancer ([place]) 2001   renal  . Chronic inflammatory demyelinating polyneuropathy ([place]) 04/07/11   ,diagnosed 1992, tx. /w prednisone x 17 yrs. has been d/c for 3 yrs.   . Chronic kidney disease       . CIDP (chronic inflammatory demyelinating polyneuropathy) ([place])   . COPD (chronic obstructive pulmonary disease) ([place])     told that he has "a bit of emphysema"  . Dialysis patient ([place])   . GERD (gastroesophageal reflux disease)   . Hypertension    stress test done - many years ago  . Kidney calculus   . Pneumonia     Past Surgical History:  Procedure Laterality Date  . ANKLE FUSION     R ankle  . AV FISTULA PLACEMENT  06/01/2011   Procedure: ARTERIOVENOUS (AV) FISTULA CREATION;  Surgeon: Angelia Mould, MD;  Location[facility] OR;  Service: Vascular;  Laterality: Left;  Creation of Left Arteriovenous fistula  . AV FISTULA PLACEMENT Left 07/12/2012   Procedure: ARTERIOVENOUS (AV) FISTULA CREATION;  Surgeon: Conrad [place], MD;  Location: [place];  Service: Vascular;  Laterality: Left;  . AV FISTULA PLACEMENT Left 4.11.14  . BASCILIC VEIN TRANSPOSITION Left 08/28/2012   Procedure: BASCILIC VEIN TRANSPOSITION;  Surgeon: Conrad , MD;  Location: [place];  Service: Vascular;  Laterality: Left;  left 2nd stage basilic vein transposition  . CHOLECYSTECTOMY    . COLONOSCOPY  2005   Dr. Laural Golden, diverticulosis, hemorrhoids, 2 small polyps  . FISTULOGRAM N/A 08/16/2011   Procedure: FISTULOGRAM;  Surgeon: Angelia Mould, MD;  [facility];  Service: Cardiovascular;  Laterality: N/A;  . fistulogram left radial cephalic AV fistula  16-11-3708     Dr. Scot Dock  . HIP ARTHROPLASTY Right    "partial"  . JOINT REPLACEMENT     L knee- 2010- [facility], L hip partial replacement   .  LUNG REMOVAL, PARTIAL  2003   partial, right  . NEPHRECTOMY  2001   left  . SHUNTOGRAM N/A 05/15/2012   Procedure: Earney Mallet;  Surgeon: Conrad Romeo, MD;  Location: [facility] CATH LAB;  Service: Cardiovascular;  Laterality: N/A;  . SHUNTOGRAM N/A 05/09/2014   Procedure: FISTULOGRAM;  Surgeon: Conrad Sumpter, MD;  Location: [facility] CATH LAB;  Service: Cardiovascular;  Laterality: N/A;    Family History  Problem Relation Age of Onset  . Heart disease Mother   . Heart disease Father   . Heart attack Father   . Anesthesia problems Neg Hx   .  Hypotension Neg Hx   . Malignant hyperthermia Neg Hx   . Pseudochol deficiency Neg Hx   . Colon cancer Neg Hx     Social History:  reports that he quit smoking about 42 years ago. His smoking use included cigarettes. He quit after 35.00 years of use. His smokeless tobacco use includes chew. He reports that he does not drink alcohol or use drugs.  Allergies: No Known Allergies  Medications: I have reviewed the patient's current medications.   Results for orders placed or performed during the hospital encounter of 10/04/19 (from the past 48 hour(s))  Basic metabolic panel     Status: Abnormal   Collection Time: 10/04/19  6:12 PM  Result Value Ref Range   Sodium 134 (L) 135 - 145 mmol/L   Potassium 4.0 3.5 - 5.1 mmol/L   Chloride 98 98 - 111 mmol/L   CO2 22 22 - 32 mmol/L   Glucose, Bld 117 (H) 70 - 99 mg/dL    Comment: Glucose reference range applies only to samples taken after fasting for at least 8 hours.   BUN 46 (H) 8 - 23 mg/dL   Creatinine, Ser 9.88 (H) 0.61 - 1.24 mg/dL   Calcium 7.5 (L) 8.9 - 10.3 mg/dL   GFR calc non Af Amer 4 (L) >60 mL/min   GFR calc Af Amer 5 (L) >60 mL/min   Anion gap 14 5 - 15    Comment: Performed at [facility], [address], [place], [place] [address]  CBC with Differential     Status: Abnormal   Collection Time: 10/04/19  6:12 PM  Result Value Ref Range   WBC 7.0 4.0 - 10.5 K/uL   RBC 3.20 (L) 4.22 - 5.81 MIL/uL   Hemoglobin 10.6 (L) 13.0 - 17.0 g/dL   HCT 33.2 (L) 39.0 - 52.0 %   MCV 103.8 (H) 80.0 - 100.0 fL   MCH 33.1 26.0 - 34.0 pg   MCHC 31.9 30.0 - 36.0 g/dL   RDW 13.5 11.5 - 15.5 %   Platelets 125 (L) 150 - 400 K/uL   nRBC 0.0 0.0 - 0.2 %   Neutrophils Relative % 83 %   Neutro Abs 5.8 1.7 - 7.7 K/uL   Lymphocytes Relative 6 %   Lymphs Abs 0.5 (L) 0.7 - 4.0 K/uL   Monocytes Relative 10 %   Monocytes Absolute 0.7 0.1 - 1.0 K/uL   Eosinophils Relative 0 %   Eosinophils Absolute 0.0 0.0 - 0.5 K/uL   Basophils Relative 0 %    Basophils Absolute 0.0 0.0 - 0.1 K/uL   Immature Granulocytes 1 %   Abs Immature Granulocytes 0.04 0.00 - 0.07 K/uL    Comment: Performed at [facility], [address], [place], [place] [address]  SARS Coronavirus 2 by RT PCR (hospital order, performed in [facility] hospital lab) Nasopharyngeal  Nasopharyngeal Swab     Status: None   Collection Time: 10/04/19  7:13 PM   Specimen: Nasopharyngeal Swab  Result Value Ref Range   SARS Coronavirus 2 NEGATIVE NEGATIVE    Comment: (NOTE) SARS-CoV-2 target nucleic acids are NOT DETECTED. The SARS-CoV-2 RNA is generally detectable in upper and lower respiratory specimens during the acute phase of infection. The lowest concentration of SARS-CoV-2 viral copies this assay can detect is 250 copies / mL. A negative result does not preclude SARS-CoV-2 infection and should not be used as the sole basis for treatment or other patient management decisions.  A negative result may occur with improper specimen collection / handling, submission of specimen other than nasopharyngeal swab, presence of viral mutation(s) within the areas targeted by this assay, and inadequate number of viral copies (<250 copies / mL). A negative result must be combined with clinical observations, patient history, and epidemiological information. Fact Sheet for Patients:   StrictlyIdeas.no Fact Sheet for Healthcare Providers: BankingDealers.co.za This test is not yet approved or cleared  by the [place] FDA and has been authorized for detection and/or diagnosis of SARS-CoV-2 by FDA under an Emergency Use Authorization (EUA).  This EUA will remain in effect (meaning this test can be used) for the duration of the COVID-19 declaration under Section 564(b)(1) of the Act, 21 U.S.C. section 360bbb-3(b)(1), unless the authorization is terminated or revoked sooner. Performed at [facility], [address] [place], [place] [address]    CBC     Status: Abnormal   Collection Time: 10/05/19  5:02 AM  Result Value Ref Range   WBC 4.9 4.0 - 10.5 K/uL   RBC 3.02 (L) 4.22 - 5.81 MIL/uL   Hemoglobin 9.8 (L) 13.0 - 17.0 g/dL   HCT 30.7 (L) 39.0 - 52.0 %   MCV 101.7 (H) 80.0 - 100.0 fL   MCH 32.5 26.0 - 34.0 pg   MCHC 31.9 30.0 - 36.0 g/dL   RDW 13.5 11.5 - 15.5 %   Platelets 112 (L) 150 - 400 K/uL    Comment: SPECIMEN CHECKED FOR CLOTS Immature Platelet Fraction may be clinically indicated, consider ordering this additional test [id number] PLATELET COUNT CONFIRMED BY SMEAR REPEATED TO VERIFY    nRBC 0.0 0.0 - 0.2 %    Comment: Performed at [facility], [address], [place], [place] [address]  Basic metabolic panel     Status: Abnormal   Collection Time: 10/05/19  5:02 AM  Result Value Ref Range   Sodium 136 135 - 145 mmol/L   Potassium 4.0 3.5 - 5.1 mmol/L   Chloride 97 (L) 98 - 111 mmol/L   CO2 22 22 - 32 mmol/L   Glucose, Bld 106 (H) 70 - 99 mg/dL    Comment: Glucose reference range applies only to samples taken after fasting for at least 8 hours.   BUN 52 (H) 8 - 23 mg/dL   Creatinine, Ser 10.61 (H) 0.61 - 1.24 mg/dL   Calcium 7.2 (L) 8.9 - 10.3 mg/dL   GFR calc non Af Amer 4 (L) >60 mL/min   GFR calc Af Amer 4 (L) >60 mL/min   Anion gap 17 (H) 5 - 15    Comment: Performed at [facility], [address] [place],  [address]    DG Hip Unilat With Pelvis 2-3 Views Right  Result Date: 10/04/2019 CLINICAL DATA:  Pain status post fall EXAM: DG HIP (WITH OR WITHOUT PELVIS) 2-3V RIGHT COMPARISON:  03/09/2019 FINDINGS: There is a  new cortical step-off involving the lateral cortex of the proximal right femur. The patient is status post total hip arthroplasty. There is osteopenia. There is no dislocation. The hardware is intact. IMPRESSION: New cortical step-off involving the lateral aspect of the proximal femoral cortex is concerning for a nondisplaced fracture. Electronically Signed   By: Constance Holster  M.D.   On: 10/04/2019 15:21    ROS: As per H&P.  Other systems reviewed and are negative. Blood pressure 134/62, pulse 68, temperature 98 F (36.7 C), temperature source Oral, resp. rate 16, height 5\' 7"  (1.702 m), weight 81.2 kg, SpO2 98 %. General: Lying on bed comfortable, not in distress Respiratory: Clear bilateral, no wheezing or crackle Cardiovascular: Regular rate rhythm, S1-S2 normal, no rubs GI: Abdomen soft, mild tenderness around right lower quadrant, not distended Skin: No rash or ulcer Neurology: Alert awake and following commands, nonfocal Extremities: No lower extremity edema, no cyanosis or clubbing Dialysis Access: Left upper extremity AV fistula has good thrill and bruit.  Assessment/Plan: 1 Nondisplaced right hip fracture after the fall: On conservative management per orthopedics.  Pain management and PT OT evaluation.  Defer to primary team.  2 ESRD MWF at [place]: Plan for HD today to resume his schedule.  AV fistula for the access.  3 Hypertension: Blood pressure acceptable.  Monitor blood pressure.  On metoprolol.  4. Anemia of ESRD: Hemoglobin 10.6 on admission.  At goal for ESRD.  We will try to obtain the treatment record from outpatient.  5. Metabolic Bone Disease: Currently on Sensipar and Renvela.  Calcium level is low at 7.2 therefore I am holding Sensipar.  Check phosphorus level.  Thank you for the consult.   We will see the patient back on 5/31. Please call us with question.  Reynalda Canny Tanna Furry 10/05/2019, 8:57 AM

## 2019-10-05 NOTE — TOC Initial Note (Signed)
Transition of Care [facility]) - Initial/Assessment Note    Patient Details  Name: [name] MRN: [id number] Date of Birth: 1930/07/12  Transition of Care [facility]) CM/SW Contact:    Salome Arnt, LCSW Phone Number: 10/05/2019, 12:29 PM  Clinical Narrative:  LCSW met with pt and pt's niece, [name] at bedside. Pt lives alone and is fairly independent with ADLs. He fell yesterday and has periprosthetic hip fracture. Pt is on dialysis at [facility] on MWF schedule. His wife is a resident at [facility]. Pt's best support is his niece, [name]. PT evaluated pt and recommend SNF. LCSW discussed placement process and provided choices. They request [place] due to dialysis. Pt states, "I don't like it (SNF) but I don't have a choice." LCSW called [facility] and spoke with Crystal who states 3 day qualifying stay can be waived as pt is observation. Will follow.                Expected Discharge Plan: Skilled Nursing Facility Barriers to Discharge: Continued Medical Work up   Patient Goals and CMS Choice Patient states their goals for this hospitalization and ongoing recovery are:: short-term SNF prior to return home CMS Medicare.gov Compare Post Acute Care list provided to:: Patient Choice offered to / list presented to : Patient  Expected Discharge Plan and Services Expected Discharge Plan: [place] In-house Referral: Clinical Social Work   Post Acute Care Choice: [place] Living arrangements for the past 2 months: [place] Expected Discharge Date: 10/05/19                                    Prior Living Arrangements/Services Living arrangements for the past 2 months: Single Family Home Lives with:: Self Patient language and need for interpreter reviewed:: Yes Do you feel safe going back to the place where you live?: No   needs short-term rehab first  Need for Family Participation in Patient Care: Yes (Comment) Care giver support  system in place?: Yes (comment) Current home services: DME(cane, walker) Criminal Activity/Legal Involvement Pertinent to Current Situation/Hospitalization: No - Comment as needed  Activities of Daily Living Home Assistive Devices/Equipment: Cane (specify quad or straight) ADL Screening (condition at time of admission) Patient's cognitive ability adequate to safely complete daily activities?: Yes Is the patient deaf or have difficulty hearing?: Yes Does the patient have difficulty seeing, even when wearing glasses/contacts?: No Does the patient have difficulty concentrating, remembering, or making decisions?: No Patient able to express need for assistance with ADLs?: No Does the patient have difficulty dressing or bathing?: No Independently performs ADLs?: Yes (appropriate for developmental age) Does the patient have difficulty walking or climbing stairs?: Yes Weakness of Legs: Right Weakness of Arms/Hands: None  Permission Sought/Granted Permission sought to share information with : Family Supports(Niece- [name])    Share Information with NAME: [name]     Permission granted to share info w Relationship: niece     Emotional Assessment Appearance:: Appears stated age Attitude/Demeanor/Rapport: Engaged Affect (typically observed): Appropriate Orientation: : Oriented to Self, Oriented to Place, Oriented to  Time, Oriented to Situation Alcohol / Substance Use: Not Applicable    Admission diagnosis:  Periprosthetic fracture around internal prosthetic right hip joint ([place]) [B15.01XA] Patient Active Problem List   Diagnosis Date Noted  . Renal cancer ([place]) 10/04/2019  . Periprosthetic fracture around internal prosthetic right hip joint ([place]) 10/04/2019  . Community acquired pneumonia  05/30/2017  . Influenza A 05/30/2017  . BPH (benign prostatic hypertrophy) 05/30/2017  . COPD (chronic obstructive pulmonary disease) ([place]) 05/30/2017  . Hypertension 05/30/2017  . GERD  (gastroesophageal reflux disease) 05/30/2017  . Pulmonary nodules/lesions, multiple 09/14/2016  . Bowel habit changes 09/11/2015  . Malignant neoplasm of right upper lobe of lung ([place]) 10/27/2012  . Visit for wound check 10/09/2012  . Swollen arm 09/15/2012  . Encounter for adequacy testing for hemodialysis ([place]) 09/01/2012  . Aftercare following surgery of the circulatory system, [place] 08/18/2012  . Other complications due to renal dialysis device, implant, and graft 04/19/2012  . End stage renal disease ([place]) 07/14/2011  . Pre-operative cardiovascular examination 04/07/2011   PCP:  Sharilyn Sites, MD Pharmacy:   [facility] [place] [address] Phone: [phone] Fax: [place], [address] [place] [address] Phone: [phone] Fax: [phone]     Social Determinants of Health (SDOH) Interventions    Readmission Risk Interventions No flowsheet data found.

## 2019-10-05 NOTE — Care Management Obs Status (Signed)
[place] NOTIFICATION   Patient Details  Name: [name] MRN: [id number] Date of Birth: 1930/08/15   Medicare Observation Status Notification Given:  Yes    Tommy Medal 10/05/2019, 2:47 PM

## 2019-10-05 NOTE — NC FL2 (Signed)
[place] LEVEL OF CARE SCREENING TOOL     IDENTIFICATION  Patient Name: [name] Birthdate: December 21, 1930 Sex: male Admission Date (Current Location): 10/04/2019  [facility] and [place] Number:  [employer] and Address:  [facility] [address] [place]      Provider Number: [id number]  Attending Physician Name and Address:  Kathie Dike, MD  Relative Name and Phone Number:       Current Level of Care: Hospital Recommended Level of Care: [place] Prior Approval Number:    Date Approved/Denied:   PASRR Number: [id number]  Discharge Plan: SNF    Current Diagnoses: Patient Active Problem List   Diagnosis Date Noted  . Renal cancer ([place]) 10/04/2019  . Periprosthetic fracture around internal prosthetic right hip joint ([place]) 10/04/2019  . Community acquired pneumonia 05/30/2017  . Influenza A 05/30/2017  . BPH (benign prostatic hypertrophy) 05/30/2017  . COPD (chronic obstructive pulmonary disease) ([place]) 05/30/2017  . Hypertension 05/30/2017  . GERD (gastroesophageal reflux disease) 05/30/2017  . Pulmonary nodules/lesions, multiple 09/14/2016  . Bowel habit changes 09/11/2015  . Malignant neoplasm of right upper lobe of lung ([place]) 10/27/2012  . Visit for wound check 10/09/2012  . Swollen arm 09/15/2012  . Encounter for adequacy testing for hemodialysis ([place]) 09/01/2012  . Aftercare following surgery of the circulatory system, [place] 08/18/2012  . Other complications due to renal dialysis device, implant, and graft 04/19/2012  . End stage renal disease ([place]) 07/14/2011  . Pre-operative cardiovascular examination 04/07/2011    Orientation RESPIRATION BLADDER Height & Weight     Self, Time, Situation, Place  Normal Continent Weight: 179 lb (81.2 kg) Height:  5\' 7"  (170.2 cm)  BEHAVIORAL SYMPTOMS/MOOD NEUROLOGICAL BOWEL NUTRITION STATUS      Continent Diet(Renal with 1200 mL fluid restriction)   AMBULATORY STATUS COMMUNICATION OF NEEDS Skin   Extensive Assist Verbally Other (Comment)(wound from biopsy, open wound to right elbow)                       Personal Care Assistance Level of Assistance  Bathing, Feeding, Dressing Bathing Assistance: Limited assistance Feeding assistance: Limited assistance Dressing Assistance: Limited assistance     Functional Limitations Info  Sight, Hearing, Speech Sight Info: Impaired Hearing Info: Impaired Speech Info: Adequate    SPECIAL CARE FACTORS FREQUENCY  PT (By licensed PT)     PT Frequency: daily              Contractures Contractures Info: Not present    Additional Factors Info  Code Status, Allergies Code Status Info: DNR Allergies Info: NKDA           Current Medications (10/05/2019):  This is the current hospital active medication list Current Facility-Administered Medications  Medication Dose Route Frequency Provider Last Rate Last Admin  . acetaminophen (TYLENOL) tablet 650 mg  650 mg Oral Q6H PRN Elgergawy, Silver Huguenin, MD   650 mg at 10/05/19 0920  . albuterol (PROVENTIL) (2.5 MG/3ML) 0.083% nebulizer solution 3 mL  3 mL Inhalation Q4H PRN Elgergawy, Silver Huguenin, MD      . Chlorhexidine Gluconate Cloth 2 % PADS 6 each  6 each Topical Q0600 Rosita Fire, MD      . heparin injection 5,000 Units  5,000 Units Subcutaneous Q8H Elgergawy, Silver Huguenin, MD   5,000 Units at 10/05/19 0600  . methocarbamol (ROBAXIN) tablet 500 mg  500 mg Oral Q6H PRN Elgergawy, Silver Huguenin,  MD       Or  . methocarbamol (ROBAXIN) 500 mg in dextrose 5 % 50 mL IVPB  500 mg Intravenous Q6H PRN Elgergawy, Silver Huguenin, MD      . metoprolol succinate (TOPROL-XL) 24 hr tablet 25 mg  25 mg Oral Daily Elgergawy, Silver Huguenin, MD   25 mg at 10/05/19 3382  . mometasone-formoterol (DULERA) 100-5 MCG/ACT inhaler 2 puff  2 puff Inhalation BID Elgergawy, Silver Huguenin, MD   2 puff at 10/05/19 0819  . oxyCODONE (Oxy IR/ROXICODONE) immediate release tablet 5 mg  5  mg Oral Q6H PRN Elgergawy, Silver Huguenin, MD   5 mg at 10/05/19 0702  . pantoprazole (PROTONIX) EC tablet 40 mg  40 mg Oral Daily Elgergawy, Silver Huguenin, MD   40 mg at 10/05/19 5053  . sevelamer carbonate (RENVELA) tablet 800 mg  800 mg Oral TID WC Elgergawy, Silver Huguenin, MD   800 mg at 10/05/19 9767     Discharge Medications: Please see discharge summary for a list of discharge medications.  Relevant Imaging Results:  Relevant Lab Results:   Additional Information SSN: [id number]. Pt goes to [facility] dialysis MWF. Pt has had both COVID vaccines per niece.  Salome Arnt, LCSW

## 2019-10-06 DIAGNOSIS — I120 Hypertensive chronic kidney disease with stage 5 chronic kidney disease or end stage renal disease: Secondary | ICD-10-CM | POA: Diagnosis not present

## 2019-10-06 DIAGNOSIS — C3411 Malignant neoplasm of upper lobe, right bronchus or lung: Secondary | ICD-10-CM

## 2019-10-06 DIAGNOSIS — M9701XA Periprosthetic fracture around internal prosthetic right hip joint, initial encounter: Secondary | ICD-10-CM | POA: Diagnosis not present

## 2019-10-06 DIAGNOSIS — J441 Chronic obstructive pulmonary disease with (acute) exacerbation: Secondary | ICD-10-CM | POA: Diagnosis not present

## 2019-10-06 DIAGNOSIS — I10 Essential (primary) hypertension: Secondary | ICD-10-CM | POA: Diagnosis not present

## 2019-10-06 DIAGNOSIS — W19XXXA Unspecified fall, initial encounter: Secondary | ICD-10-CM | POA: Diagnosis not present

## 2019-10-06 DIAGNOSIS — N2581 Secondary hyperparathyroidism of renal origin: Secondary | ICD-10-CM | POA: Diagnosis not present

## 2019-10-06 DIAGNOSIS — N186 End stage renal disease: Secondary | ICD-10-CM | POA: Diagnosis present

## 2019-10-06 DIAGNOSIS — D509 Iron deficiency anemia, unspecified: Secondary | ICD-10-CM | POA: Diagnosis not present

## 2019-10-06 DIAGNOSIS — S72145A Nondisplaced intertrochanteric fracture of left femur, initial encounter for closed fracture: Secondary | ICD-10-CM

## 2019-10-06 DIAGNOSIS — R918 Other nonspecific abnormal finding of lung field: Secondary | ICD-10-CM | POA: Diagnosis not present

## 2019-10-06 DIAGNOSIS — K219 Gastro-esophageal reflux disease without esophagitis: Secondary | ICD-10-CM | POA: Diagnosis present

## 2019-10-06 DIAGNOSIS — Z992 Dependence on renal dialysis: Secondary | ICD-10-CM | POA: Diagnosis not present

## 2019-10-06 DIAGNOSIS — M9701XD Periprosthetic fracture around internal prosthetic right hip joint, subsequent encounter: Secondary | ICD-10-CM | POA: Diagnosis present

## 2019-10-06 DIAGNOSIS — M6281 Muscle weakness (generalized): Secondary | ICD-10-CM | POA: Diagnosis present

## 2019-10-06 DIAGNOSIS — D631 Anemia in chronic kidney disease: Secondary | ICD-10-CM | POA: Diagnosis not present

## 2019-10-06 DIAGNOSIS — C649 Malignant neoplasm of unspecified kidney, except renal pelvis: Secondary | ICD-10-CM | POA: Diagnosis present

## 2019-10-06 DIAGNOSIS — R2689 Other abnormalities of gait and mobility: Secondary | ICD-10-CM | POA: Diagnosis present

## 2019-10-06 DIAGNOSIS — N250 Renal osteodystrophy: Secondary | ICD-10-CM | POA: Diagnosis not present

## 2019-10-06 DIAGNOSIS — Z20822 Contact with and (suspected) exposure to covid-19: Secondary | ICD-10-CM | POA: Diagnosis not present

## 2019-10-06 DIAGNOSIS — J449 Chronic obstructive pulmonary disease, unspecified: Secondary | ICD-10-CM | POA: Diagnosis present

## 2019-10-06 LAB — HEPATITIS B SURFACE ANTIGEN: Hepatitis B Surface Ag: NONREACTIVE

## 2019-10-06 LAB — PHOSPHORUS: Phosphorus: 3.9 mg/dL (ref 2.5–4.6)

## 2019-10-06 NOTE — Progress Notes (Signed)
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  End-stage renal disease Monday Wednesday Friday dialysis at [facility]. He presents to the ER with right hip pain and has had a reportedly positive biopsy for renal cell cancer.  His right hip x-ray showed a nondisplaced fracture.  He underwent dialysis 10/05/2019 with removal of 2 L.  Blood pressure 143/72 pulse 80 temperature 99.1 O2 sats 90% room air  Sodium 136 potassium 4 chloride 97 CO2 22 BUN 52 creatinine 10.6 glucose 106 calcium 7.2 hemoglobin 9.8 platelets 112  Metoprolol 25 mg daily Dulera 2 puffs daily Protonix 40 mg daily Renvela 800 mg with meals     Objective:  Vital signs in last 24 hours:  Temp:  [97.8 F (36.6 C)-99.1 F (37.3 C)] 99.1 F (37.3 C) (05/29 0536) Pulse Rate:  [70-86] 80 (05/29 0547) Resp:  [16-20] 18 (05/29 0536) BP: (100-157)/(50-123) 143/72 (05/29 0547) SpO2:  [92 %-100 %] 92 % (05/29 0831) Weight:  [80.2 kg-81.2 kg] 80.2 kg (05/29 0513)  Weight change: 0.006 kg Filed Weights   10/04/19 1436 10/05/19 2149 10/06/19 0513  Weight: 81.2 kg 81.2 kg 80.2 kg    Intake/Output: I/O last 3 completed shifts: In: 240 [P.O.:240] Out: 2075 [Urine:75; Other:2000]   Intake/Output this shift:  No intake/output data recorded. Nondistressed CVS- RRR no murmurs rubs or gallops RS- CTA no wheezes rales ABD- BS present soft non-distended EXT- no edema left upper arm AV fistula   Basic Metabolic Panel: Recent Labs  Lab 10/04/19 1812 10/05/19 0502 10/06/19 0633  NA 134* 136  --   K 4.0 4.0  --   CL 98 97*  --   CO2 22 22  --   GLUCOSE 117* 106*  --   BUN 46* 52*  --   CREATININE 9.88* 10.61*  --   CALCIUM 7.5* 7.2*  --   PHOS  --   --  3.9    Liver Function Tests: No results for input(s): AST, ALT, ALKPHOS,  BILITOT, PROT, ALBUMIN in the last 168 hours. No results for input(s): LIPASE, AMYLASE in the last 168 hours. No results for input(s): AMMONIA in the last 168 hours.  CBC: Recent Labs  Lab 10/02/19 1040 10/04/19 1812 10/05/19 0502  WBC 5.2 7.0 4.9  NEUTROABS  --  5.8  --   HGB 11.8* 10.6* 9.8*  HCT 37.0* 33.2* 30.7*  MCV 102.8* 103.8* 101.7*  PLT 134* 125* 112*    Cardiac Enzymes: No results for input(s): CKTOTAL, CKMB, CKMBINDEX, TROPONINI in the last 168 hours.  BNP: Invalid input(s): POCBNP  CBG: No results for input(s): GLUCAP in the last 168 hours.  Microbiology: Results for orders placed or performed during the hospital encounter of 10/04/19  SARS Coronavirus 2 by RT PCR (hospital order, performed in [facility] hospital lab) Nasopharyngeal Nasopharyngeal Swab     Status: None   Collection Time: 10/04/19  7:13 PM   Specimen: Nasopharyngeal Swab  Result Value Ref Range Status   SARS Coronavirus 2 NEGATIVE NEGATIVE Final    Comment: (NOTE) SARS-CoV-2 target nucleic acids are NOT DETECTED. The SARS-CoV-2 RNA is generally detectable in upper and lower respiratory specimens during the acute phase of infection. The lowest concentration of SARS-CoV-2 viral copies this assay can detect is 250 copies / mL. A negative result does not preclude  SARS-CoV-2 infection and should not be used as the sole basis for treatment or other patient management decisions.  A negative result may occur with improper specimen collection / handling, submission of specimen other than nasopharyngeal swab, presence of viral mutation(s) within the areas targeted by this assay, and inadequate number of viral copies (<250 copies / mL). A negative result must be combined with clinical observations, patient history, and epidemiological information. Fact Sheet for Patients:   StrictlyIdeas.no Fact Sheet for Healthcare Providers: BankingDealers.co.za This  test is not yet approved or cleared  by the [place] FDA and has been authorized for detection and/or diagnosis of SARS-CoV-2 by FDA under an Emergency Use Authorization (EUA).  This EUA will remain in effect (meaning this test can be used) for the duration of the COVID-19 declaration under Section 564(b)(1) of the Act, 21 U.S.C. section 360bbb-3(b)(1), unless the authorization is terminated or revoked sooner. Performed at [facility], [address] [place], [place] [address]     Coagulation Studies: No results for input(s): LABPROT, INR in the last 72 hours.  Urinalysis: No results for input(s): COLORURINE, LABSPEC, PHURINE, GLUCOSEU, HGBUR, BILIRUBINUR, KETONESUR, PROTEINUR, UROBILINOGEN, NITRITE, LEUKOCYTESUR in the last 72 hours.  Invalid input(s): APPERANCEUR    Imaging: DG Hand Complete Right  Result Date: 10/05/2019 CLINICAL DATA:  Right in pain.  Fell. EXAM: RIGHT HAND - COMPLETE 3+ VIEW COMPARISON:  None. FINDINGS: Moderate age related degenerative changes. Moderate chondrocalcinosis is noted at the wrist joint. No definite acute fractures. IMPRESSION: 1. Moderate age related degenerative changes and chondrocalcinosis. 2. No definite acute fracture. Electronically Signed   By: Marijo Sanes M.D.   On: 10/05/2019 16:05   DG Hip Unilat With Pelvis 2-3 Views Right  Result Date: 10/04/2019 CLINICAL DATA:  Pain status post fall EXAM: DG HIP (WITH OR WITHOUT PELVIS) 2-3V RIGHT COMPARISON:  03/09/2019 FINDINGS: There is a new cortical step-off involving the lateral cortex of the proximal right femur. The patient is status post total hip arthroplasty. There is osteopenia. There is no dislocation. The hardware is intact. IMPRESSION: New cortical step-off involving the lateral aspect of the proximal femoral cortex is concerning for a nondisplaced fracture. Electronically Signed   By: Constance Holster M.D.   On: 10/04/2019 15:21     Medications:   . sodium chloride    .  sodium chloride    . methocarbamol (ROBAXIN) IV     . Chlorhexidine Gluconate Cloth  6 each Topical Q0600  . heparin  5,000 Units Subcutaneous Q8H  . metoprolol succinate  25 mg Oral Daily  . mometasone-formoterol  2 puff Inhalation BID  . pantoprazole  40 mg Oral Daily  . sevelamer carbonate  800 mg Oral TID WC   sodium chloride, sodium chloride, acetaminophen, albuterol, lidocaine (PF), lidocaine-prilocaine, methocarbamol **OR** methocarbamol (ROBAXIN) IV, oxyCODONE, pentafluoroprop-tetrafluoroeth  Assessment/ Plan:  1 Nondisplaced right hip fracture after the fall: On conservative management per orthopedics.  Pain management and PT OT evaluation.  Defer to primary team.  2 ESRD MWF at [facility]: Underwent successful dialysis 3-1/2 hours removal of 2 L 10/05/2019  3 Hypertension: Blood pressure acceptable.  Monitor blood pressure.  On metoprolol.  4. Anemia of ESRD: Hemoglobin 10.6 on admission.  At goal for ESRD.  We will try to obtain the treatment record from outpatient.  5. Metabolic Bone Disease: Sensipar being held.  Calcium level little low side.    LOS: 0 Sherril Croon @TODAY @10 :34 AM

## 2019-10-06 NOTE — Progress Notes (Signed)
Patient seen and examined.  Vitals are stable overnight.  Feels that his right hand pain/swelling is better.  He sitting up in the chair this morning.  Hip pain is reasonably controlled.  Lungs are clear to auscultation bilaterally.  Heart sounds are normal.  Patient admitted with periprosthetic fracture right hip.  Management is nonoperative after discussing with orthopedics.  Will follow up with orthopedics in the next 2 weeks with follow-up x-rays.  Please refer to discharge summary done on 5/28 for further details.  He is to discharge to skilled nursing facility today.  [employer]

## 2019-10-06 NOTE — Plan of Care (Signed)
  Problem: Education: Goal: Knowledge of General Education information will improve Description: Including pain rating scale, medication(s)/side effects and non-pharmacologic comfort measures Outcome: Adequate for Discharge   Problem: Health Behavior/Discharge Planning: Goal: Ability to manage health-related needs will improve Outcome: Adequate for Discharge   Problem: Clinical Measurements: Goal: Ability to maintain clinical measurements within normal limits will improve Outcome: Adequate for Discharge Goal: Will remain free from infection Outcome: Adequate for Discharge Goal: Respiratory complications will improve Outcome: Adequate for Discharge Goal: Cardiovascular complication will be avoided Outcome: Adequate for Discharge   Problem: Activity: Goal: Risk for activity intolerance will decrease Outcome: Adequate for Discharge   Problem: Coping: Goal: Level of anxiety will decrease Outcome: Adequate for Discharge   Problem: Elimination: Goal: Will not experience complications related to bowel motility Outcome: Adequate for Discharge Goal: Will not experience complications related to urinary retention Outcome: Adequate for Discharge   Problem: Pain Managment: Goal: General experience of comfort will improve Outcome: Adequate for Discharge   Problem: Safety: Goal: Ability to remain free from injury will improve Outcome: Adequate for Discharge   Problem: Skin Integrity: Goal: Risk for impaired skin integrity will decrease Outcome: Adequate for Discharge

## 2019-10-06 NOTE — TOC Transition Note (Signed)
Transition of Care [facility]) - CM/SW Discharge Note   Patient Details  Name: [name] MRN: [id number] Date of Birth: 1930/10/05  [facility] CM/SW Contact:  Ihor Gully, LCSW Phone Number: 10/06/2019, 11:01 AM   Clinical Narrative:    [name] at [place] notified of discharge, she has d/c summary. RN to call report. TOC signing off.    Final next level of care: Skilled Nursing Facility Barriers to Discharge: Continued Medical Work up   Patient Goals and CMS Choice Patient states their goals for this hospitalization and ongoing recovery are:: short-term SNF prior to return home CMS Medicare.gov Compare Post Acute Care list provided to:: Patient Choice offered to / list presented to : Patient  Discharge Placement              Patient chooses bed at: Other - please specify in the comment section below:([facility]) Patient to be transferred to facility by: [facility] Name of family member notified: Message left for niece, Ms. Underwood Patient and family notified of of transfer: 10/06/19  Discharge Plan and Services In-house Referral: Clinical Social Work   Post Acute Care Choice: [place]                               Social Determinants of Health (SDOH) Interventions     Readmission Risk Interventions No flowsheet data found.

## 2019-10-08 DIAGNOSIS — D631 Anemia in chronic kidney disease: Secondary | ICD-10-CM | POA: Diagnosis not present

## 2019-10-08 DIAGNOSIS — D509 Iron deficiency anemia, unspecified: Secondary | ICD-10-CM | POA: Diagnosis not present

## 2019-10-08 DIAGNOSIS — N2581 Secondary hyperparathyroidism of renal origin: Secondary | ICD-10-CM | POA: Diagnosis not present

## 2019-10-08 DIAGNOSIS — Z992 Dependence on renal dialysis: Secondary | ICD-10-CM | POA: Diagnosis not present

## 2019-10-08 DIAGNOSIS — N186 End stage renal disease: Secondary | ICD-10-CM | POA: Diagnosis not present

## 2019-10-09 ENCOUNTER — Telehealth: Payer: Self-pay | Admitting: *Deleted

## 2019-10-09 DIAGNOSIS — K219 Gastro-esophageal reflux disease without esophagitis: Secondary | ICD-10-CM | POA: Diagnosis not present

## 2019-10-09 DIAGNOSIS — I10 Essential (primary) hypertension: Secondary | ICD-10-CM | POA: Diagnosis not present

## 2019-10-09 DIAGNOSIS — N186 End stage renal disease: Secondary | ICD-10-CM | POA: Diagnosis not present

## 2019-10-09 DIAGNOSIS — J449 Chronic obstructive pulmonary disease, unspecified: Secondary | ICD-10-CM | POA: Diagnosis not present

## 2019-10-09 LAB — SURGICAL PATHOLOGY

## 2019-10-09 NOTE — Telephone Encounter (Addendum)
Asking to reschedule his new patient appointment with Dr. Benay Spice for week of 10/15/19. He is currently in [facility] in [place] for rehab, but feels he will be able to do w/c transfers by then.  Per Dr. Benay Spice: Schedule for 10/16/19 at 2 pm. Appointment scheduled and niece notified.

## 2019-10-11 DIAGNOSIS — M19041 Primary osteoarthritis, right hand: Secondary | ICD-10-CM | POA: Diagnosis not present

## 2019-10-15 DIAGNOSIS — I10 Essential (primary) hypertension: Secondary | ICD-10-CM | POA: Diagnosis not present

## 2019-10-15 DIAGNOSIS — M79641 Pain in right hand: Secondary | ICD-10-CM | POA: Diagnosis not present

## 2019-10-15 DIAGNOSIS — N186 End stage renal disease: Secondary | ICD-10-CM | POA: Diagnosis not present

## 2019-10-15 DIAGNOSIS — W19XXXA Unspecified fall, initial encounter: Secondary | ICD-10-CM | POA: Diagnosis not present

## 2019-10-16 ENCOUNTER — Other Ambulatory Visit: Payer: Self-pay

## 2019-10-16 ENCOUNTER — Inpatient Hospital Stay: Payer: Medicare Other | Attending: Oncology | Admitting: Oncology

## 2019-10-16 ENCOUNTER — Ambulatory Visit: Payer: Medicare Other | Admitting: Orthopedic Surgery

## 2019-10-16 VITALS — BP 140/70 | HR 67 | Temp 97.7°F | Resp 17 | Ht 67.0 in

## 2019-10-16 DIAGNOSIS — Z79899 Other long term (current) drug therapy: Secondary | ICD-10-CM | POA: Insufficient documentation

## 2019-10-16 DIAGNOSIS — Z905 Acquired absence of kidney: Secondary | ICD-10-CM | POA: Insufficient documentation

## 2019-10-16 DIAGNOSIS — C3412 Malignant neoplasm of upper lobe, left bronchus or lung: Secondary | ICD-10-CM | POA: Insufficient documentation

## 2019-10-16 DIAGNOSIS — I120 Hypertensive chronic kidney disease with stage 5 chronic kidney disease or end stage renal disease: Secondary | ICD-10-CM | POA: Insufficient documentation

## 2019-10-16 DIAGNOSIS — C7901 Secondary malignant neoplasm of right kidney and renal pelvis: Secondary | ICD-10-CM | POA: Insufficient documentation

## 2019-10-16 DIAGNOSIS — J449 Chronic obstructive pulmonary disease, unspecified: Secondary | ICD-10-CM | POA: Insufficient documentation

## 2019-10-16 DIAGNOSIS — C3411 Malignant neoplasm of upper lobe, right bronchus or lung: Secondary | ICD-10-CM | POA: Insufficient documentation

## 2019-10-16 DIAGNOSIS — C641 Malignant neoplasm of right kidney, except renal pelvis: Secondary | ICD-10-CM | POA: Diagnosis not present

## 2019-10-16 DIAGNOSIS — N186 End stage renal disease: Secondary | ICD-10-CM | POA: Insufficient documentation

## 2019-10-16 DIAGNOSIS — Z992 Dependence on renal dialysis: Secondary | ICD-10-CM | POA: Insufficient documentation

## 2019-10-16 NOTE — Progress Notes (Signed)
[name]   Requesting MD: [name], [place],  [place] [address]   [name] 86 y.o.  03-Feb-1931    Reason for [name]: Metastatic renal cell carcinoma   HPI: [name] has been followed by Dr. Tressie [name] since he was diagnosed with a right kidney cancer in April 2021.  He underwent a right nephrectomy for a T2N0 lesion.  In 2003 he was diagnosed with a bronchoalveolar carcinoma of the lung and underwent a left upper lobectomy.  He was confirmed to have stage I disease.  He had a squamous cell carcinoma of the right upper lung, PD1 positive at 40%, measuring 10 x 15 mm in January 2020 and was treated with primary radiation.  He has been followed by Dr. Tressie [name] for surveillance imaging.  A CT of the chest on 08/27/2019 revealed multiple borderline enlarged mediastinal nodes including a 1.4 cm right paratracheal node.  An evolving masslike area of architectural distortion is noted in the right upper lobe favored to represent post radiation change.  Numerous pulmonary nodules bilaterally consistent with metastatic disease.  The lesions have increased in size compared to a CT from December 2020.  The largest lesion in the left lower lobe measured 1.5 x 0.9 cm.  There are changes of emphysema.  He was referred for a staging PET scan on 09/18/2019.  This revealed multiple metastatic lesions in the chest with less FDG uptake compared to a hypermetabolic mediastinal node.  Posttreatment changes were noted in the right upper lobe with increased metabolic activity.  Intense metabolic activity is associated with a destructive lesion at the right iliac crest.  FDG activity at the colon is potentially related to inflammation from diverticular disease.  [name] fell on 10/04/2019 and was found to have a periprosthetic right hip fracture.  Orthopedics did not recommend surgery.  He was discharged to a skilled nursing facility for physical  therapy.  He reports pain at the right femur has improved.  [name] underwent a CT-guided biopsy of the right iliac lesion on 10/02/2019.  The pathology confirmed metastatic carcinoma, the tumor cells are positive for cytokeratin 7 and racemase and negative for cytokeratin 20, PSA, prostate and, and TTF-1.  A PAX8 stain returned positive.  The findings are felt to be consistent with renal cell carcinoma.  [name] hopes to return home from the skilled nursing facility in the near future.  He continues Monday, Wednesday, and Friday hemodialysis. Past Medical History:  Diagnosis Date  . Arthritis    ankle , hip, knees , low back   . BPH (benign prostatic hypertrophy)   . Cancer (HCC)-stage I, left upper lobe  2003   lung  . Cancer ([place]) 2001   renal-right  . Chronic inflammatory demyelinating polyneuropathy ([place]) 04/07/11   ,diagnosed 1992, tx. /w prednisone x 17 yrs.  . Chronic kidney disease       . CIDP (chronic inflammatory demyelinating polyneuropathy) ([place])   . COPD (chronic obstructive pulmonary disease) ([place])    told that he has "a bit of emphysema"  . Dialysis patient ([place])   . GERD (gastroesophageal reflux disease)   . Hypertension    stress test done - many years ago  . Kidney calculus   . Pneumonia  2019    .  Squamous cell carcinoma right upper lung, PD1-40%, 10 x 15 mm, January 2020 treated with primary radiation   .    Past Surgical History:  Procedure  Laterality Date  . ANKLE FUSION     R ankle  . AV FISTULA PLACEMENT  06/01/2011   Procedure: ARTERIOVENOUS (AV) FISTULA CREATION;  Surgeon: Angelia Mould, MD;  Location: [facility] OR;  Service: Vascular;  Laterality: Left;  Creation of Left Arteriovenous fistula  . AV FISTULA PLACEMENT Left 07/12/2012   Procedure: ARTERIOVENOUS (AV) FISTULA CREATION;  Surgeon: Conrad Weston, MD;  Location: [place];  Service: Vascular;  Laterality: Left;  . AV FISTULA PLACEMENT Left 4.11.14  . BASCILIC VEIN TRANSPOSITION Left 08/28/2012    Procedure: BASCILIC VEIN TRANSPOSITION;  Surgeon: Conrad [place], MD;  Location: [facility];  Service: Vascular;  Laterality: Left;  left 2nd stage basilic vein transposition  . CHOLECYSTECTOMY    . COLONOSCOPY  2005   Dr. Laural Golden, diverticulosis, hemorrhoids, 2 small polyps  . FISTULOGRAM N/A 08/16/2011   Procedure: FISTULOGRAM;  Surgeon: Angelia Mould, MD;  Location: [facility] CATH LAB;  Service: Cardiovascular;  Laterality: N/A;  . fistulogram left radial cephalic AV fistula  [phone]     Dr. Scot Dock  . HIP ARTHROPLASTY Right    "partial"  . JOINT REPLACEMENT     L knee- 2010- [facility], L hip partial replacement   . LUNG REMOVAL, PARTIAL  2003   partial, right  . NEPHRECTOMY  2001   Right  . SHUNTOGRAM N/A 05/15/2012   Procedure: Earney Mallet;  Surgeon: Conrad Imbery, MD;  Location: [facility] CATH LAB;  Service: Cardiovascular;  Laterality: N/A;  . SHUNTOGRAM N/A 05/09/2014   Procedure: FISTULOGRAM;  Surgeon: Conrad , MD;  Location: [facility] CATH LAB;  Service: Cardiovascular;  Laterality: N/A;    Medications: Reviewed  Allergies: No Known Allergies  Family history: No family history of cancer  Social History:   He lives alone in [place].  He is retired after working in a [employer].  He quit smoking cigarettes in 1978.  No alcohol use.  No transfusion history.  No risk factor for HIV or hepatitis.  ROS:   Positives include: Minimal urine output, constipated since discharge to the rehabilitation center, left hand numbness following placement of the left arm fistula, right wrist pain following the fall May 2021, right leg pain when standing, dyspnea since being diagnosed with pneumonia in 2019  A complete ROS was otherwise negative.  Physical Exam:  Blood pressure 140/70, pulse 67, temperature 97.7 F (36.5 C), temperature source Temporal, resp. rate 17, height 5\' 7"  (1.702 m), SpO2 100 %.  HEENT: Neck without mass Lungs: Bronchial sounds at the left upper posterior chest, no respiratory  distress Cardiac: Regular rate and rhythm, 2/6 systolic murmur Abdomen: Nontender, no hepatosplenomegaly, no mass  Vascular: Trace pitting edema at the right greater than left lower leg and ankle Lymph nodes: No cervical, supraclavicular, axillary, or inguinal nodes Neurologic: Alert and oriented, the motor exam appears intact in the upper and lower extremities bilaterally Skin: No rash Musculoskeletal: No spine tenderness   LAB:  CBC  Lab Results  Component Value Date   WBC 4.9 10/05/2019   HGB 9.8 (L) 10/05/2019   HCT 30.7 (L) 10/05/2019   MCV 101.7 (H) 10/05/2019   PLT 112 (L) 10/05/2019   NEUTROABS 5.8 10/04/2019        CMP  Lab Results  Component Value Date   NA 136 10/05/2019   K 4.0 10/05/2019   CL 97 (L) 10/05/2019   CO2 22 10/05/2019   GLUCOSE 106 (H) 10/05/2019   BUN 52 (H) 10/05/2019   CREATININE 10.61 (H)  10/05/2019   CALCIUM 7.2 (L) 10/05/2019   PROT 6.0 10/26/2013   ALBUMIN 3.2 (L) 06/03/2017   AST 14 10/19/2011   ALT 13 10/19/2011   ALKPHOS 85 10/19/2011   BILITOT 0.3 10/19/2011   GFRNONAA 4 (L) 10/05/2019   GFRAA 4 (L) 10/05/2019       Imaging:  CT chest 08/28/2019 and PET 09/18/2019-images reviewed with [name] and his niece   Assessment/Plan:   1. Metastatic renal cell carcinoma   Right nephrectomy April 2001, T2N0 lesion  CT biopsy of a right iliac lesion on 10/02/2019-metastatic carcinoma, cytokeratin 7, racemase, and PAX8 positive  CT chest 08/28/2019-evolving post radiation changes in the right upper lobe, enlarging bilateral lung nodules  PET scan 09/18/2019-hypermetabolic right paratracheal node, post radiation changes in the right upper lobe with associated hypermetabolism, mildly hypermetabolic lung nodules, intensely hypermetabolic lytic lesion at the right iliac crest, focal hypermetabolism of the right colon corresponding to an area of diverticular disease 2. Stage I left upper lung cancer 2003, status post a left upper  lobectomy 3. Squamous cell carcinoma of the right upper lobe, clinical stage IA2, status post SBRT in 3 fractions April 2020 4. 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 COPD 6. Hypertension 7. End-stage renal disease maintained on hemodialysis since 2015 8. Fall with a fracture of the lateral cortex of the proximal right femur 10/04/2019-nonoperative   Disposition:   [name] has been diagnosed with metastatic renal cell carcinoma.  I reviewed the CT and PET images with [name] and his niece.  We discussed the prognosis and treatment options.  He appears asymptomatic from the renal cell carcinoma at present.  He appears to have indolent disease after being diagnosed with a right renal cell carcinoma in 2001.  The bilateral lung nodules and hypermetabolic mediastinal node likely reflect metastatic renal cell carcinoma, though it is possible some of the lesions are related to metastatic or primary lung tumors.  He has multiple comorbid conditions including COPD and end-stage renal disease.  He had a severe episode of demyelinating peripheral neuropathy in the 1990s requiring plasmapheresis and chronic steroid therapy.  We discussed treatment of metastatic renal cell carcinoma with tyrosine kinase inhibitors and immunotherapy.  I am reluctant to consider immunotherapy in his case given the history of CIDP.  We discussed treatment with a single agent tyrosine kinase inhibitor such as axitinib.  We also discussed observation.  [name] favors observation with a repeat CT at a 3-62-month interval to assess the pace of the metastatic renal cell carcinoma.  He will continue physical therapy and hopes to return home soon.  He will call for new symptoms.  He will be scheduled for an office visit after the restaging chest CT during the week of 12/17/2019.  Betsy Coder, MD  10/16/2019, 5:09 PM

## 2019-10-17 ENCOUNTER — Telehealth: Payer: Self-pay | Admitting: *Deleted

## 2019-10-17 NOTE — Telephone Encounter (Addendum)
Asking if Dr. Benay Spice is OK with him continuing to receive epogen w/dialysis treatments in regards to his cancer diagnosis? Per Dr. Benay Spice: OK to continue epogen. Attempted to reach PA--no answer. Called the kidney center and informed Seth Bake that [place] to continue epogen.

## 2019-10-19 ENCOUNTER — Ambulatory Visit: Payer: Medicare Other | Admitting: Orthopedic Surgery

## 2019-10-22 DIAGNOSIS — Z992 Dependence on renal dialysis: Secondary | ICD-10-CM | POA: Diagnosis not present

## 2019-10-23 ENCOUNTER — Other Ambulatory Visit: Payer: Self-pay

## 2019-10-23 ENCOUNTER — Ambulatory Visit (INDEPENDENT_AMBULATORY_CARE_PROVIDER_SITE_OTHER): Payer: Medicare Other | Admitting: Orthopedic Surgery

## 2019-10-23 ENCOUNTER — Ambulatory Visit: Payer: Medicare Other

## 2019-10-23 VITALS — Ht 67.0 in | Wt 180.0 lb

## 2019-10-23 DIAGNOSIS — M9701XA Periprosthetic fracture around internal prosthetic right hip joint, initial encounter: Secondary | ICD-10-CM | POA: Diagnosis not present

## 2019-10-23 DIAGNOSIS — M25461 Effusion, right knee: Secondary | ICD-10-CM

## 2019-10-23 NOTE — Progress Notes (Signed)
Chief Complaint  Patient presents with  . Hip Pain    R/hairline fx/DOI 08/04/19/[place]/    87 bipolar replacement for hip fracture  Developed a periprosthetic greater troches fracture type a 1  3 weeks after injury x-ray today shows fracture stable  Patient was ambulating well developed swelling and pain in his right knee and right leg had an ultrasound this morning results pending  Our x-ray today shows the fracture is stable  I aspirated and injected his right knee for arthritic effusion  Procedure note injection and aspiration right knee joint  Verbal consent was obtained to aspirate and inject the right knee joint   Timeout was completed to confirm the site of aspiration and injection  An 18-gauge needle was used to aspirate the knee joint from a suprapatellar lateral approach.  The medications used were 40 mg of Depo-Medrol and 1% lidocaine 3 cc  Anesthesia was provided by ethyl chloride and the skin was prepped with alcohol.  After cleaning the skin with alcohol an 18-gauge needle was used to aspirate the right knee joint.  We obtained 40  cc of fluid clear yellow  We follow this by injection of 40 mg of Depo-Medrol and 3 cc 1% lidocaine.  There were no complications. A sterile bandage was applied.  Encounter Diagnoses  Name Primary?  . Periprosthetic fracture around internal prosthetic right hip joint, initial encounter ([place]) Yes  . Effusion, right knee     Follow-up 3 weeks x-ray

## 2019-11-08 ENCOUNTER — Telehealth: Payer: Self-pay | Admitting: Orthopedic Surgery

## 2019-11-08 DIAGNOSIS — C3411 Malignant neoplasm of upper lobe, right bronchus or lung: Secondary | ICD-10-CM | POA: Diagnosis present

## 2019-11-08 DIAGNOSIS — N2581 Secondary hyperparathyroidism of renal origin: Secondary | ICD-10-CM | POA: Diagnosis not present

## 2019-11-08 DIAGNOSIS — K219 Gastro-esophageal reflux disease without esophagitis: Secondary | ICD-10-CM | POA: Diagnosis present

## 2019-11-08 DIAGNOSIS — N186 End stage renal disease: Secondary | ICD-10-CM | POA: Diagnosis present

## 2019-11-08 DIAGNOSIS — D509 Iron deficiency anemia, unspecified: Secondary | ICD-10-CM | POA: Diagnosis not present

## 2019-11-08 DIAGNOSIS — D631 Anemia in chronic kidney disease: Secondary | ICD-10-CM | POA: Diagnosis not present

## 2019-11-08 DIAGNOSIS — J449 Chronic obstructive pulmonary disease, unspecified: Secondary | ICD-10-CM | POA: Diagnosis present

## 2019-11-08 DIAGNOSIS — Z992 Dependence on renal dialysis: Secondary | ICD-10-CM | POA: Diagnosis not present

## 2019-11-08 DIAGNOSIS — R2689 Other abnormalities of gait and mobility: Secondary | ICD-10-CM | POA: Diagnosis present

## 2019-11-08 DIAGNOSIS — M9701XD Periprosthetic fracture around internal prosthetic right hip joint, subsequent encounter: Secondary | ICD-10-CM | POA: Diagnosis present

## 2019-11-08 DIAGNOSIS — C649 Malignant neoplasm of unspecified kidney, except renal pelvis: Secondary | ICD-10-CM | POA: Diagnosis present

## 2019-11-08 DIAGNOSIS — I10 Essential (primary) hypertension: Secondary | ICD-10-CM | POA: Diagnosis present

## 2019-11-08 DIAGNOSIS — G629 Polyneuropathy, unspecified: Secondary | ICD-10-CM | POA: Diagnosis not present

## 2019-11-08 DIAGNOSIS — M6281 Muscle weakness (generalized): Secondary | ICD-10-CM | POA: Diagnosis present

## 2019-11-08 NOTE — Telephone Encounter (Signed)
[name] with [facility] (at [facility]) would like to know if Dr Aline Brochure will sign orders for Physical Therapy and Occupational Therapy. He should be discharging next week. Call back @ [phone]

## 2019-11-08 NOTE — Telephone Encounter (Signed)
Let me see what I m signing

## 2019-11-09 DIAGNOSIS — N186 End stage renal disease: Secondary | ICD-10-CM | POA: Diagnosis not present

## 2019-11-09 DIAGNOSIS — D509 Iron deficiency anemia, unspecified: Secondary | ICD-10-CM | POA: Diagnosis not present

## 2019-11-09 DIAGNOSIS — N2581 Secondary hyperparathyroidism of renal origin: Secondary | ICD-10-CM | POA: Diagnosis not present

## 2019-11-09 DIAGNOSIS — Z992 Dependence on renal dialysis: Secondary | ICD-10-CM | POA: Diagnosis not present

## 2019-11-09 DIAGNOSIS — D631 Anemia in chronic kidney disease: Secondary | ICD-10-CM | POA: Diagnosis not present

## 2019-11-09 NOTE — Telephone Encounter (Signed)
They are going to order PT OT evaluate and treat for when he goes home, they just need a doctor to give the verbal orders and sign the progress notes, want to know if you will do this? You will get the notes to sign, same as usual.

## 2019-11-09 NOTE — Telephone Encounter (Signed)
[facility] has called and Angie advised yes, he will sign the orders for PT/ OT evaluate and treat

## 2019-11-09 NOTE — Telephone Encounter (Signed)
I called to advise, no answer no voicemail  If they call back, yes, Dr Aline Brochure will sign off on the evaluate and treat orders for PT.and OT

## 2019-11-09 NOTE — Telephone Encounter (Signed)
Ok I guess

## 2019-11-11 DIAGNOSIS — Z992 Dependence on renal dialysis: Secondary | ICD-10-CM | POA: Diagnosis not present

## 2019-11-11 DIAGNOSIS — N186 End stage renal disease: Secondary | ICD-10-CM | POA: Diagnosis not present

## 2019-11-11 DIAGNOSIS — N2581 Secondary hyperparathyroidism of renal origin: Secondary | ICD-10-CM | POA: Diagnosis not present

## 2019-11-11 DIAGNOSIS — D509 Iron deficiency anemia, unspecified: Secondary | ICD-10-CM | POA: Diagnosis not present

## 2019-11-11 DIAGNOSIS — D631 Anemia in chronic kidney disease: Secondary | ICD-10-CM | POA: Diagnosis not present

## 2019-11-12 DIAGNOSIS — D509 Iron deficiency anemia, unspecified: Secondary | ICD-10-CM | POA: Diagnosis not present

## 2019-11-12 DIAGNOSIS — Z992 Dependence on renal dialysis: Secondary | ICD-10-CM | POA: Diagnosis not present

## 2019-11-12 DIAGNOSIS — N186 End stage renal disease: Secondary | ICD-10-CM | POA: Diagnosis not present

## 2019-11-12 DIAGNOSIS — N2581 Secondary hyperparathyroidism of renal origin: Secondary | ICD-10-CM | POA: Diagnosis not present

## 2019-11-12 DIAGNOSIS — D631 Anemia in chronic kidney disease: Secondary | ICD-10-CM | POA: Diagnosis not present

## 2019-11-13 ENCOUNTER — Encounter: Payer: Self-pay | Admitting: Orthopedic Surgery

## 2019-11-13 ENCOUNTER — Other Ambulatory Visit: Payer: Self-pay

## 2019-11-13 ENCOUNTER — Ambulatory Visit: Payer: Medicare Other

## 2019-11-13 ENCOUNTER — Ambulatory Visit (INDEPENDENT_AMBULATORY_CARE_PROVIDER_SITE_OTHER): Payer: Medicare Other | Admitting: Orthopedic Surgery

## 2019-11-13 DIAGNOSIS — M9701XD Periprosthetic fracture around internal prosthetic right hip joint, subsequent encounter: Secondary | ICD-10-CM

## 2019-11-13 DIAGNOSIS — M9701XA Periprosthetic fracture around internal prosthetic right hip joint, initial encounter: Secondary | ICD-10-CM

## 2019-11-13 NOTE — Progress Notes (Signed)
Chief Complaint  Patient presents with  . Follow-up    Recheck on right hip fracture, DOI 08-04-19.    Follow-up for a greater trochanteric fracture of the right hip after previous bipolar  The fracture has healed nicely.  The patient has no hip pain does complain of some mild right knee pain and right ankle swelling from edema  X-rays were reviewed the prosthesis is stable her fracture is healed the patient is released to normal activity  Encounter Diagnoses  Name Primary?  . Periprosthetic fracture around internal prosthetic right hip joint, initial encounter ([place]) 10/04/19 Yes  . Periprosthetic fracture around internal prosthetic right hip joint, subsequent encounter

## 2019-11-14 DIAGNOSIS — D509 Iron deficiency anemia, unspecified: Secondary | ICD-10-CM | POA: Diagnosis not present

## 2019-11-14 DIAGNOSIS — Z992 Dependence on renal dialysis: Secondary | ICD-10-CM | POA: Diagnosis not present

## 2019-11-14 DIAGNOSIS — N186 End stage renal disease: Secondary | ICD-10-CM | POA: Diagnosis not present

## 2019-11-14 DIAGNOSIS — D631 Anemia in chronic kidney disease: Secondary | ICD-10-CM | POA: Diagnosis not present

## 2019-11-14 DIAGNOSIS — N2581 Secondary hyperparathyroidism of renal origin: Secondary | ICD-10-CM | POA: Diagnosis not present

## 2019-11-16 DIAGNOSIS — Z992 Dependence on renal dialysis: Secondary | ICD-10-CM | POA: Diagnosis not present

## 2019-11-16 DIAGNOSIS — N2581 Secondary hyperparathyroidism of renal origin: Secondary | ICD-10-CM | POA: Diagnosis not present

## 2019-11-16 DIAGNOSIS — N186 End stage renal disease: Secondary | ICD-10-CM | POA: Diagnosis not present

## 2019-11-16 DIAGNOSIS — D509 Iron deficiency anemia, unspecified: Secondary | ICD-10-CM | POA: Diagnosis not present

## 2019-11-16 DIAGNOSIS — D631 Anemia in chronic kidney disease: Secondary | ICD-10-CM | POA: Diagnosis not present

## 2019-11-19 DIAGNOSIS — Z992 Dependence on renal dialysis: Secondary | ICD-10-CM | POA: Diagnosis not present

## 2019-11-19 DIAGNOSIS — D631 Anemia in chronic kidney disease: Secondary | ICD-10-CM | POA: Diagnosis not present

## 2019-11-19 DIAGNOSIS — D509 Iron deficiency anemia, unspecified: Secondary | ICD-10-CM | POA: Diagnosis not present

## 2019-11-19 DIAGNOSIS — N2581 Secondary hyperparathyroidism of renal origin: Secondary | ICD-10-CM | POA: Diagnosis not present

## 2019-11-19 DIAGNOSIS — N186 End stage renal disease: Secondary | ICD-10-CM | POA: Diagnosis not present

## 2019-11-20 DIAGNOSIS — G629 Polyneuropathy, unspecified: Secondary | ICD-10-CM | POA: Diagnosis not present

## 2019-11-21 ENCOUNTER — Emergency Department (HOSPITAL_COMMUNITY): Payer: Medicare Other

## 2019-11-21 ENCOUNTER — Encounter (HOSPITAL_COMMUNITY): Payer: Self-pay | Admitting: Emergency Medicine

## 2019-11-21 ENCOUNTER — Other Ambulatory Visit: Payer: Self-pay

## 2019-11-21 ENCOUNTER — Inpatient Hospital Stay (HOSPITAL_COMMUNITY)
Admission: EM | Admit: 2019-11-21 | Discharge: 2019-11-28 | DRG: 054 | Disposition: A | Discharge status: Home Health | Payer: Medicare Other | Attending: Internal Medicine | Admitting: Internal Medicine

## 2019-11-21 DIAGNOSIS — W19XXXA Unspecified fall, initial encounter: Secondary | ICD-10-CM | POA: Diagnosis present

## 2019-11-21 DIAGNOSIS — Z85118 Personal history of other malignant neoplasm of bronchus and lung: Secondary | ICD-10-CM | POA: Diagnosis not present

## 2019-11-21 DIAGNOSIS — C649 Malignant neoplasm of unspecified kidney, except renal pelvis: Secondary | ICD-10-CM | POA: Diagnosis not present

## 2019-11-21 DIAGNOSIS — Z981 Arthrodesis status: Secondary | ICD-10-CM

## 2019-11-21 DIAGNOSIS — I120 Hypertensive chronic kidney disease with stage 5 chronic kidney disease or end stage renal disease: Secondary | ICD-10-CM | POA: Diagnosis present

## 2019-11-21 DIAGNOSIS — S72002A Fracture of unspecified part of neck of left femur, initial encounter for closed fracture: Secondary | ICD-10-CM | POA: Diagnosis present

## 2019-11-21 DIAGNOSIS — Z905 Acquired absence of kidney: Secondary | ICD-10-CM

## 2019-11-21 DIAGNOSIS — J449 Chronic obstructive pulmonary disease, unspecified: Secondary | ICD-10-CM | POA: Diagnosis present

## 2019-11-21 DIAGNOSIS — Z515 Encounter for palliative care: Secondary | ICD-10-CM | POA: Diagnosis present

## 2019-11-21 DIAGNOSIS — R200 Anesthesia of skin: Secondary | ICD-10-CM | POA: Diagnosis not present

## 2019-11-21 DIAGNOSIS — J441 Chronic obstructive pulmonary disease with (acute) exacerbation: Secondary | ICD-10-CM | POA: Diagnosis not present

## 2019-11-21 DIAGNOSIS — K219 Gastro-esophageal reflux disease without esophagitis: Secondary | ICD-10-CM | POA: Diagnosis present

## 2019-11-21 DIAGNOSIS — E1122 Type 2 diabetes mellitus with diabetic chronic kidney disease: Secondary | ICD-10-CM | POA: Diagnosis present

## 2019-11-21 DIAGNOSIS — F17220 Nicotine dependence, chewing tobacco, uncomplicated: Secondary | ICD-10-CM | POA: Diagnosis present

## 2019-11-21 DIAGNOSIS — C3490 Malignant neoplasm of unspecified part of unspecified bronchus or lung: Secondary | ICD-10-CM

## 2019-11-21 DIAGNOSIS — Z902 Acquired absence of lung [part of]: Secondary | ICD-10-CM

## 2019-11-21 DIAGNOSIS — E875 Hyperkalemia: Secondary | ICD-10-CM | POA: Diagnosis not present

## 2019-11-21 DIAGNOSIS — G936 Cerebral edema: Secondary | ICD-10-CM | POA: Diagnosis not present

## 2019-11-21 DIAGNOSIS — G6181 Chronic inflammatory demyelinating polyneuritis: Secondary | ICD-10-CM | POA: Diagnosis present

## 2019-11-21 DIAGNOSIS — N400 Enlarged prostate without lower urinary tract symptoms: Secondary | ICD-10-CM | POA: Diagnosis present

## 2019-11-21 DIAGNOSIS — Z20822 Contact with and (suspected) exposure to covid-19: Secondary | ICD-10-CM | POA: Diagnosis present

## 2019-11-21 DIAGNOSIS — I10 Essential (primary) hypertension: Secondary | ICD-10-CM | POA: Diagnosis not present

## 2019-11-21 DIAGNOSIS — R202 Paresthesia of skin: Secondary | ICD-10-CM

## 2019-11-21 DIAGNOSIS — E8889 Other specified metabolic disorders: Secondary | ICD-10-CM | POA: Diagnosis present

## 2019-11-21 DIAGNOSIS — C642 Malignant neoplasm of left kidney, except renal pelvis: Secondary | ICD-10-CM | POA: Diagnosis not present

## 2019-11-21 DIAGNOSIS — Z96643 Presence of artificial hip joint, bilateral: Secondary | ICD-10-CM | POA: Diagnosis present

## 2019-11-21 DIAGNOSIS — R569 Unspecified convulsions: Secondary | ICD-10-CM | POA: Diagnosis not present

## 2019-11-21 DIAGNOSIS — D509 Iron deficiency anemia, unspecified: Secondary | ICD-10-CM | POA: Diagnosis not present

## 2019-11-21 DIAGNOSIS — Z7189 Other specified counseling: Secondary | ICD-10-CM

## 2019-11-21 DIAGNOSIS — M1990 Unspecified osteoarthritis, unspecified site: Secondary | ICD-10-CM | POA: Diagnosis present

## 2019-11-21 DIAGNOSIS — M9701XA Periprosthetic fracture around internal prosthetic right hip joint, initial encounter: Secondary | ICD-10-CM | POA: Diagnosis not present

## 2019-11-21 DIAGNOSIS — I616 Nontraumatic intracerebral hemorrhage, multiple localized: Secondary | ICD-10-CM | POA: Diagnosis not present

## 2019-11-21 DIAGNOSIS — Y842 Radiological procedure and radiotherapy as the cause of abnormal reaction of the patient, or of later complication, without mention of misadventure at the time of the procedure: Secondary | ICD-10-CM | POA: Diagnosis present

## 2019-11-21 DIAGNOSIS — N2581 Secondary hyperparathyroidism of renal origin: Secondary | ICD-10-CM | POA: Diagnosis present

## 2019-11-21 DIAGNOSIS — Z992 Dependence on renal dialysis: Secondary | ICD-10-CM | POA: Diagnosis not present

## 2019-11-21 DIAGNOSIS — Z03818 Encounter for observation for suspected exposure to other biological agents ruled out: Secondary | ICD-10-CM | POA: Diagnosis not present

## 2019-11-21 DIAGNOSIS — Z8249 Family history of ischemic heart disease and other diseases of the circulatory system: Secondary | ICD-10-CM

## 2019-11-21 DIAGNOSIS — C3411 Malignant neoplasm of upper lobe, right bronchus or lung: Secondary | ICD-10-CM | POA: Diagnosis present

## 2019-11-21 DIAGNOSIS — N186 End stage renal disease: Secondary | ICD-10-CM | POA: Diagnosis present

## 2019-11-21 DIAGNOSIS — Z923 Personal history of irradiation: Secondary | ICD-10-CM

## 2019-11-21 DIAGNOSIS — Z9049 Acquired absence of other specified parts of digestive tract: Secondary | ICD-10-CM

## 2019-11-21 DIAGNOSIS — G319 Degenerative disease of nervous system, unspecified: Secondary | ICD-10-CM | POA: Diagnosis not present

## 2019-11-21 DIAGNOSIS — C7951 Secondary malignant neoplasm of bone: Secondary | ICD-10-CM | POA: Diagnosis not present

## 2019-11-21 DIAGNOSIS — Z96652 Presence of left artificial knee joint: Secondary | ICD-10-CM | POA: Diagnosis present

## 2019-11-21 DIAGNOSIS — Z66 Do not resuscitate: Secondary | ICD-10-CM | POA: Diagnosis present

## 2019-11-21 DIAGNOSIS — D631 Anemia in chronic kidney disease: Secondary | ICD-10-CM | POA: Diagnosis not present

## 2019-11-21 DIAGNOSIS — D638 Anemia in other chronic diseases classified elsewhere: Secondary | ICD-10-CM | POA: Diagnosis present

## 2019-11-21 DIAGNOSIS — R4182 Altered mental status, unspecified: Secondary | ICD-10-CM | POA: Diagnosis not present

## 2019-11-21 DIAGNOSIS — T380X5A Adverse effect of glucocorticoids and synthetic analogues, initial encounter: Secondary | ICD-10-CM | POA: Diagnosis not present

## 2019-11-21 DIAGNOSIS — Z87442 Personal history of urinary calculi: Secondary | ICD-10-CM

## 2019-11-21 DIAGNOSIS — G9389 Other specified disorders of brain: Secondary | ICD-10-CM | POA: Diagnosis not present

## 2019-11-21 DIAGNOSIS — N250 Renal osteodystrophy: Secondary | ICD-10-CM | POA: Diagnosis not present

## 2019-11-21 DIAGNOSIS — C7931 Secondary malignant neoplasm of brain: Secondary | ICD-10-CM | POA: Diagnosis not present

## 2019-11-21 DIAGNOSIS — E1165 Type 2 diabetes mellitus with hyperglycemia: Secondary | ICD-10-CM | POA: Diagnosis not present

## 2019-11-21 DIAGNOSIS — C7800 Secondary malignant neoplasm of unspecified lung: Secondary | ICD-10-CM | POA: Diagnosis present

## 2019-11-21 DIAGNOSIS — R278 Other lack of coordination: Secondary | ICD-10-CM | POA: Diagnosis present

## 2019-11-21 DIAGNOSIS — Z79899 Other long term (current) drug therapy: Secondary | ICD-10-CM

## 2019-11-21 DIAGNOSIS — Z85528 Personal history of other malignant neoplasm of kidney: Secondary | ICD-10-CM

## 2019-11-21 LAB — APTT: aPTT: 39 seconds — ABNORMAL HIGH (ref 24–36)

## 2019-11-21 LAB — CBC
HCT: 29.2 % — ABNORMAL LOW (ref 39.0–52.0)
Hemoglobin: 9.3 g/dL — ABNORMAL LOW (ref 13.0–17.0)
MCH: 32.2 pg (ref 26.0–34.0)
MCHC: 31.8 g/dL (ref 30.0–36.0)
MCV: 101 fL — ABNORMAL HIGH (ref 80.0–100.0)
Platelets: 151 10*3/uL (ref 150–400)
RBC: 2.89 MIL/uL — ABNORMAL LOW (ref 4.22–5.81)
RDW: 13.2 % (ref 11.5–15.5)
WBC: 5.1 10*3/uL (ref 4.0–10.5)
nRBC: 0 % (ref 0.0–0.2)

## 2019-11-21 LAB — PROTIME-INR
INR: 1 (ref 0.8–1.2)
Prothrombin Time: 13.2 seconds (ref 11.4–15.2)

## 2019-11-21 LAB — COMPREHENSIVE METABOLIC PANEL
ALT: 13 U/L (ref 0–44)
AST: 12 U/L — ABNORMAL LOW (ref 15–41)
Albumin: 3.1 g/dL — ABNORMAL LOW (ref 3.5–5.0)
Alkaline Phosphatase: 150 U/L — ABNORMAL HIGH (ref 38–126)
Anion gap: 14 (ref 5–15)
BUN: 48 mg/dL — ABNORMAL HIGH (ref 8–23)
CO2: 24 mmol/L (ref 22–32)
Calcium: 7.5 mg/dL — ABNORMAL LOW (ref 8.9–10.3)
Chloride: 98 mmol/L (ref 98–111)
Creatinine, Ser: 9.22 mg/dL — ABNORMAL HIGH (ref 0.61–1.24)
GFR calc Af Amer: 5 mL/min — ABNORMAL LOW (ref >60)
GFR calc non Af Amer: 5 mL/min — ABNORMAL LOW (ref >60)
Glucose, Bld: 123 mg/dL — ABNORMAL HIGH (ref 70–99)
Potassium: 4.7 mmol/L (ref 3.5–5.1)
Sodium: 136 mmol/L (ref 135–145)
Total Bilirubin: 0.6 mg/dL (ref 0.3–1.2)
Total Protein: 6.4 g/dL — ABNORMAL LOW (ref 6.5–8.1)

## 2019-11-21 LAB — DIFFERENTIAL
Abs Immature Granulocytes: 0.03 10*3/uL (ref 0.00–0.07)
Basophils Absolute: 0 10*3/uL (ref 0.0–0.1)
Basophils Relative: 0 %
Eosinophils Absolute: 0.1 10*3/uL (ref 0.0–0.5)
Eosinophils Relative: 3 %
Immature Granulocytes: 1 %
Lymphocytes Relative: 11 %
Lymphs Abs: 0.6 10*3/uL — ABNORMAL LOW (ref 0.7–4.0)
Monocytes Absolute: 0.7 10*3/uL (ref 0.1–1.0)
Monocytes Relative: 14 %
Neutro Abs: 3.7 10*3/uL (ref 1.7–7.7)
Neutrophils Relative %: 71 %

## 2019-11-21 LAB — I-STAT CHEM 8, ED
BUN: 42 mg/dL — ABNORMAL HIGH (ref 8–23)
Calcium, Ion: 0.97 mmol/L — ABNORMAL LOW (ref 1.15–1.40)
Chloride: 97 mmol/L — ABNORMAL LOW (ref 98–111)
Creatinine, Ser: 9.9 mg/dL — ABNORMAL HIGH (ref 0.61–1.24)
Glucose, Bld: 121 mg/dL — ABNORMAL HIGH (ref 70–99)
HCT: 28 % — ABNORMAL LOW (ref 39.0–52.0)
Hemoglobin: 9.5 g/dL — ABNORMAL LOW (ref 13.0–17.0)
Potassium: 4.6 mmol/L (ref 3.5–5.1)
Sodium: 137 mmol/L (ref 135–145)
TCO2: 23 mmol/L (ref 22–32)

## 2019-11-21 LAB — ETHANOL: Alcohol, Ethyl (B): <10 mg/dL (ref <10)

## 2019-11-21 LAB — SARS CORONAVIRUS 2 BY RT PCR (HOSPITAL ORDER, PERFORMED IN ~~LOC~~ HOSPITAL LAB): SARS Coronavirus 2: NEGATIVE

## 2019-11-21 MED ORDER — SODIUM CHLORIDE 0.9 % IV SOLN: 250.0000 mL | INTRAVENOUS | Status: DC | PRN

## 2019-11-21 MED ORDER — LORAZEPAM 2 MG/ML IJ SOLN: 2.0000 mg | INTRAMUSCULAR | Status: DC | PRN

## 2019-11-21 MED ORDER — ACETAMINOPHEN 325 MG PO TABS: 650.0000 mg | ORAL_TABLET | Freq: Four times a day (QID) | ORAL | Status: DC | PRN

## 2019-11-21 MED ORDER — SODIUM CHLORIDE 0.9% FLUSH: 3.0000 mL | INTRAVENOUS | Status: DC | PRN

## 2019-11-21 MED ORDER — CINACALCET HCL 30 MG PO TABS
60.0000 mg | ORAL_TABLET | Freq: Every day | ORAL | Status: DC
  Administered 2019-11-22: 60 mg via ORAL
  Filled 2019-11-21 (×3): qty 2

## 2019-11-21 MED ORDER — ACETAMINOPHEN 650 MG RE SUPP: 650.0000 mg | Freq: Four times a day (QID) | RECTAL | Status: DC | PRN

## 2019-11-21 MED ORDER — DEXAMETHASONE SODIUM PHOSPHATE 4 MG/ML IJ SOLN
4.0000 mg | Freq: Four times a day (QID) | INTRAMUSCULAR | Status: DC
  Administered 2019-11-21 – 2019-11-24 (×10): 4 mg via INTRAVENOUS
  Filled 2019-11-21 (×12): qty 1

## 2019-11-21 MED ORDER — LIDOCAINE-PRILOCAINE 2.5-2.5 % EX CREA
1.0000 "application " | TOPICAL_CREAM | CUTANEOUS | Status: DC
  Filled 2019-11-21: qty 5

## 2019-11-21 MED ORDER — LEVETIRACETAM IN NACL 500 MG/100ML IV SOLN
500.0000 mg | Freq: Two times a day (BID) | INTRAVENOUS | Status: DC
  Administered 2019-11-21 – 2019-11-24 (×6): 500 mg via INTRAVENOUS
  Filled 2019-11-21 (×6): qty 100

## 2019-11-21 MED ORDER — PANTOPRAZOLE SODIUM 40 MG PO TBEC
40.0000 mg | DELAYED_RELEASE_TABLET | Freq: Every day | ORAL | Status: DC
  Administered 2019-11-21 – 2019-11-28 (×8): 40 mg via ORAL
  Filled 2019-11-21 (×8): qty 1

## 2019-11-21 MED ORDER — METOPROLOL SUCCINATE ER 25 MG PO TB24
25.0000 mg | ORAL_TABLET | Freq: Every day | ORAL | Status: DC
  Administered 2019-11-22 – 2019-11-27 (×5): 25 mg via ORAL
  Filled 2019-11-21 (×5): qty 1

## 2019-11-21 MED ORDER — DEXAMETHASONE SODIUM PHOSPHATE 10 MG/ML IJ SOLN
10.0000 mg | Freq: Once | INTRAMUSCULAR | Status: AC
  Administered 2019-11-21: 10 mg via INTRAVENOUS
  Filled 2019-11-21: qty 1

## 2019-11-21 MED ORDER — ONDANSETRON HCL 4 MG/2ML IJ SOLN: 4.0000 mg | Freq: Four times a day (QID) | INTRAMUSCULAR | Status: DC | PRN

## 2019-11-21 MED ORDER — ONDANSETRON HCL 4 MG PO TABS: 4.0000 mg | ORAL_TABLET | Freq: Four times a day (QID) | ORAL | Status: DC | PRN

## 2019-11-21 MED ORDER — SEVELAMER CARBONATE 800 MG PO TABS
800.0000 mg | ORAL_TABLET | Freq: Three times a day (TID) | ORAL | Status: DC
  Administered 2019-11-22 – 2019-11-28 (×16): 800 mg via ORAL
  Filled 2019-11-21 (×20): qty 1

## 2019-11-21 MED ORDER — SODIUM CHLORIDE 0.9% FLUSH
3.0000 mL | Freq: Two times a day (BID) | INTRAVENOUS | Status: DC
  Administered 2019-11-22 – 2019-11-28 (×11): 3 mL via INTRAVENOUS

## 2019-11-21 MED ORDER — IPRATROPIUM-ALBUTEROL 0.5-2.5 (3) MG/3ML IN SOLN: 3.0000 mL | Freq: Four times a day (QID) | RESPIRATORY_TRACT | Status: DC | PRN

## 2019-11-21 MED ORDER — ALBUTEROL SULFATE (2.5 MG/3ML) 0.083% IN NEBU: 3.0000 mL | INHALATION_SOLUTION | RESPIRATORY_TRACT | Status: DC | PRN

## 2019-11-21 MED ORDER — MOMETASONE FURO-FORMOTEROL FUM 100-5 MCG/ACT IN AERO
2.0000 | INHALATION_SPRAY | Freq: Two times a day (BID) | RESPIRATORY_TRACT | Status: DC
  Administered 2019-11-21 – 2019-11-28 (×10): 2 via RESPIRATORY_TRACT
  Filled 2019-11-21: qty 8.8

## 2019-11-21 MED ORDER — LEVETIRACETAM IN NACL 1000 MG/100ML IV SOLN
1000.0000 mg | Freq: Once | INTRAVENOUS | Status: AC
  Administered 2019-11-21: 1000 mg via INTRAVENOUS
  Filled 2019-11-21: qty 100

## 2019-11-21 MED ORDER — OXYCODONE HCL 5 MG PO TABS: 5.0000 mg | ORAL_TABLET | Freq: Four times a day (QID) | ORAL | Status: DC | PRN

## 2019-11-21 NOTE — Progress Notes (Signed)
Started patient on dexamethasone 4 mg IV every 6 hours as well as Keppra 500 mg IV twice daily after discussion with Dr.Arora.  Palliative consultation also obtained to discuss goals of care.  Please also consult neurosurgery after arrival for recommendations from their standpoint as well.

## 2019-11-21 NOTE — ED Provider Notes (Signed)
[facility] EMERGENCY DEPARTMENT Provider Note   CSN: [id number] Arrival date & time: 11/21/19  0305     History Chief Complaint  Patient presents with  . Numbness    [name] is a 86 y.o. male.  Recently had a trochanteric fracture of his left hip and was discharged to skilled nursing in on Friday states he woke up and multiple episodes of what he describes as "something crawling underneath my skin" on both his left upper and lower extremities.  He has some sensation changes with that as well.  Told his doctor at the skilled nursing is that he might of had a mini stroke "did not do anything else about it.  He was discharged yesterday he was able to stand but this morning is not able to stand and he is not sure why.  No trouble swallowing or talking.  Nurse notes that he was very difficult to help transfer from wheelchair to the bed offering not much assistance secondary to significant weakness.       Past Medical History:  Diagnosis Date  . Arthritis    ankle , hip, knees , low back   . BPH (benign prostatic hypertrophy)   . Cancer ([place])    lung  . Cancer ([place]) 2001   renal  . Chronic inflammatory demyelinating polyneuropathy ([place]) 04/07/11   ,diagnosed 1992, tx. /w prednisone x 17 yrs. has been d/c for 3 yrs.   . Chronic kidney disease       . CIDP (chronic inflammatory demyelinating polyneuropathy) ([place])   . COPD (chronic obstructive pulmonary disease) ([place])    told that he has "a bit of emphysema"  . Dialysis patient ([place])   . GERD (gastroesophageal reflux disease)   . Hypertension    stress test done - many years ago  . Kidney calculus   . Pneumonia     Patient Active Problem List   Diagnosis Date Noted  . Renal cancer ([place]) 10/04/2019  . Periprosthetic fracture around internal prosthetic right hip joint ([place]) 10/04/2019 10/04/2019  . Community acquired pneumonia 05/30/2017  . Influenza A 05/30/2017  . BPH (benign prostatic hypertrophy) 05/30/2017  .  COPD (chronic obstructive pulmonary disease) ([place]) 05/30/2017  . Hypertension 05/30/2017  . GERD (gastroesophageal reflux disease) 05/30/2017  . Pulmonary nodules/lesions, multiple 09/14/2016  . Bowel habit changes 09/11/2015  . Malignant neoplasm of right upper lobe of lung ([place]) 10/27/2012  . Visit for wound check 10/09/2012  . Swollen arm 09/15/2012  . Encounter for adequacy testing for hemodialysis ([place]) 09/01/2012  . Aftercare following surgery of the circulatory system, [place] 08/18/2012  . Other complications due to renal dialysis device, implant, and graft 04/19/2012  . End stage renal disease ([place]) 07/14/2011  . Pre-operative cardiovascular examination 04/07/2011    Past Surgical History:  Procedure Laterality Date  . ANKLE FUSION     R ankle  . AV FISTULA PLACEMENT  06/01/2011   Procedure: ARTERIOVENOUS (AV) FISTULA CREATION;  Surgeon: Angelia Mould, MD;  Location: [facility] OR;  Service: Vascular;  Laterality: Left;  Creation of Left Arteriovenous fistula  . AV FISTULA PLACEMENT Left 07/12/2012   Procedure: ARTERIOVENOUS (AV) FISTULA CREATION;  Surgeon: Conrad [place], MD;  Location: [facility];  Service: Vascular;  Laterality: Left;  . AV FISTULA PLACEMENT Left 4.11.14  . BASCILIC VEIN TRANSPOSITION Left 08/28/2012   Procedure: BASCILIC VEIN TRANSPOSITION;  Surgeon: Conrad [place], MD;  Location: [facility];  Service: Vascular;  Laterality: Left;  left 2nd  stage basilic vein transposition  . CHOLECYSTECTOMY    . COLONOSCOPY  2005   Dr. Laural Golden, diverticulosis, hemorrhoids, 2 small polyps  . FISTULOGRAM N/A 08/16/2011   Procedure: FISTULOGRAM;  Surgeon: Angelia Mould, MD;  Location: [facility] CATH LAB;  Service: Cardiovascular;  Laterality: N/A;  . fistulogram left radial cephalic AV fistula  [phone]     Dr. Scot Dock  . HIP ARTHROPLASTY Right    "partial"  . JOINT REPLACEMENT     L knee- 2010- [facility], L hip partial replacement   . LUNG REMOVAL, PARTIAL  2003   partial, right  . NEPHRECTOMY   2001   left  . SHUNTOGRAM N/A 05/15/2012   Procedure: Earney Mallet;  Surgeon: Conrad [place], MD;  Location: [facility] CATH LAB;  Service: Cardiovascular;  Laterality: N/A;  . SHUNTOGRAM N/A 05/09/2014   Procedure: FISTULOGRAM;  Surgeon: Conrad [place], MD;  Location: [facility] CATH LAB;  Service: Cardiovascular;  Laterality: N/A;       Family History  Problem Relation Age of Onset  . Heart disease Mother   . Heart disease Father   . Heart attack Father   . Anesthesia problems Neg Hx   . Hypotension Neg Hx   . Malignant hyperthermia Neg Hx   . Pseudochol deficiency Neg Hx   . Colon cancer Neg Hx     Social History   Tobacco Use  . Smoking status: Former Smoker    Years: 35.00    Types: Cigarettes    Quit date: 02/04/1977    Years since quitting: 42.8  . Smokeless tobacco: Current User    Types: Chew  . Tobacco comment: Quit in 1978  Vaping Use  . Vaping Use: Never used  Substance Use Topics  . Alcohol use: No    Alcohol/week: 0.0 standard drinks  . Drug use: No    Home Medications Prior to Admission medications   Medication Sig Start Date End Date Taking? Authorizing Provider  acetaminophen (TYLENOL) 325 MG tablet Take 2 tablets (650 mg total) by mouth every 6 (six) hours as needed for mild pain or moderate pain. 10/05/19   Kathie Dike, MD  cinacalcet (SENSIPAR) 60 MG tablet Take 60 mg by mouth daily.    [provider]  Fluticasone-Salmeterol (ADVAIR) 100-50 MCG/DOSE AEPB Inhale 1 puff into the lungs in the morning and at bedtime. Patient not taking: Reported on 11/13/2019 09/04/19   [provider]  lidocaine-prilocaine (EMLA) cream Apply 1 application topically every Monday, Wednesday, and Friday with hemodialysis.  12/17/14   [provider]  metoprolol succinate (TOPROL-XL) 25 MG 24 hr tablet Take 25 mg by mouth daily.    [provider]  omeprazole (PRILOSEC) 20 MG capsule Take 20 mg by mouth daily.    [provider]  oxyCODONE (OXY  IR/ROXICODONE) 5 MG immediate release tablet Take 1 tablet (5 mg total) by mouth every 6 (six) hours as needed for severe pain. 10/05/19   Kathie Dike, MD  Sennosides-Docusate Sodium 8.6-50 MG CAPS Take 1 capsule by mouth in the morning and at bedtime.    [provider]  sevelamer carbonate (RENVELA) 800 MG tablet Take 800 mg by mouth 3 (three) times daily with meals.     [provider]  VENTOLIN HFA 108 (90 Base) MCG/ACT inhaler Inhale 2 puffs into the lungs every 4 (four) hours as needed for wheezing or shortness of breath.  09/04/19   [provider]    Allergies    Patient has no  known allergies.  Review of Systems   Review of Systems  All other systems reviewed and are negative.   Physical Exam Updated Vital Signs BP (!) 140/93 (BP Location: Right Arm)   Pulse 78   Temp 98.2 F (36.8 C) (Oral)   Resp 18   Ht 5\' 6"  (1.676 m)   Wt 81.6 kg   SpO2 98%   BMI 29.05 kg/m   Physical Exam Vitals and nursing note reviewed.  Constitutional:      Appearance: He is well-developed.  HENT:     Head: Normocephalic and atraumatic.  Cardiovascular:     Rate and Rhythm: Normal rate.  Pulmonary:     Effort: Pulmonary effort is normal. No respiratory distress.  Abdominal:     General: There is no distension.  Musculoskeletal:        General: Normal range of motion.     Cervical back: Normal range of motion.  Skin:    General: Skin is warm and dry.  Neurological:     Mental Status: He is alert and oriented to person, place, and time.     Cranial Nerves: Cranial nerve deficit (left facial droop) present.     Sensory: No sensory deficit.     Motor: Weakness (left grip weak compared to right but bicep and tricep equal) present.     Gait: Gait abnormal (weak per nursing report, didn't test).     ED Results / Procedures / Treatments   Labs (all labs ordered are listed, but only abnormal results are displayed) Labs Reviewed  ETHANOL  PROTIME-INR    APTT  CBC  DIFFERENTIAL  COMPREHENSIVE METABOLIC PANEL  RAPID URINE DRUG SCREEN, HOSP PERFORMED  URINALYSIS, ROUTINE W REFLEX MICROSCOPIC  I-STAT CHEM 8, ED    EKG EKG Interpretation  Date/Time:  Wednesday November 21 2019 03:26:46 EDT Ventricular Rate:  73 PR Interval:    QRS Duration: 106 QT Interval:  423 QTC Calculation: 467 R Axis:   -60 Text Interpretation: Sinus rhythm Ventricular premature complex Prolonged PR interval Left anterior fascicular block Abnormal R-wave progression, early transition similar to january 2019 Confirmed by Merrily Pew [phone]) on 11/21/2019 3:39:33 AM   Radiology No results found.  Procedures Procedures (including critical care time)  Medications Ordered in ED Medications - No data to display  ED Course  I have reviewed the triage vital signs and the nursing notes.  Pertinent labs & imaging results that were available during my care of the patient were reviewed by me and considered in my medical decision making (see chart for details).    MDM Rules/Calculators/A&P                         eval for electrolyte abnormalities. If all normal and no obvious explanation for his symptoms, will consider TIA obs.  Ultimately found to have vasogenic edema and likely metastatic mass to brain bringin up the possibility of this being focal seizures? Keppra/decadron added. Neuro consult ordered (haven't called back at the time of this note completion), MRI ordered. Patient and niece updated. Plan for consults as needed and results of tests then discuss options.   Needs dialysis today.   Final Clinical Impression(s) / ED Diagnoses Final diagnoses:  None    Rx / DC Orders ED Discharge Orders    None       Elizabeth Paulsen, Corene Cornea, MD 11/21/19 [phone]

## 2019-11-21 NOTE — ED Notes (Signed)
Pt given meal tray.

## 2019-11-21 NOTE — ED Notes (Signed)
Pt given food tray.

## 2019-11-21 NOTE — Consult Note (Signed)
[facility] Renal Consultation Note    Indication for Consultation:  Management of ESRD/hemodialysis; anemia, hypertension/volume and secondary hyperparathyroidism  HPI: [name] is a 86 y.o. 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  He was also unable to stand this morning and much weaker.  In the ED he was found to have metastatic disease to the brain (left frontal and right parietal lesions and possibly in superior right cerebellum).  He is to be admitted back at [facility], however we were consulted to provide HD during his stay.  Of note, he was discharged to SNF from the hospital on 10/06/19 after suffering a mechanical fall on 2/56/38 complicated by periprosthetic right hip fraction.  He was not treated surgically.  Past Medical History:  Diagnosis Date  . Arthritis    ankle , hip, knees , low back   . BPH (benign prostatic hypertrophy)   . Cancer ([place])    lung  . Cancer ([place]) 2001   renal  . Chronic inflammatory demyelinating polyneuropathy ([place]) 04/07/11   ,diagnosed 1992, tx. /w prednisone x 17 yrs. has been d/c for 3 yrs.   . Chronic kidney disease       . CIDP (chronic inflammatory demyelinating polyneuropathy) ([place])   . COPD (chronic obstructive pulmonary disease) ([place])    told that he has "a bit of emphysema"  . Dialysis patient ([place])   . GERD (gastroesophageal reflux disease)   . Hypertension    stress test done - many years ago  . Kidney calculus   . Pneumonia    Past Surgical History:  Procedure Laterality Date  . ANKLE FUSION     R ankle  . AV FISTULA PLACEMENT  06/01/2011    Procedure: ARTERIOVENOUS (AV) FISTULA CREATION;  Surgeon: Angelia Mould, MD;  Location: [facility] OR;  Service: Vascular;  Laterality: Left;  Creation of Left Arteriovenous fistula  . AV FISTULA PLACEMENT Left 07/12/2012   Procedure: ARTERIOVENOUS (AV) FISTULA CREATION;  Surgeon: Conrad Bricelyn, MD;  Location: [place];  Service: Vascular;  Laterality: Left;  . AV FISTULA PLACEMENT Left 4.11.14  . BASCILIC VEIN TRANSPOSITION Left 08/28/2012   Procedure: BASCILIC VEIN TRANSPOSITION;  Surgeon: Conrad Belmont, MD;  Location: [place];  Service: Vascular;  Laterality: Left;  left 2nd stage basilic vein transposition  . CHOLECYSTECTOMY    . COLONOSCOPY  2005   Dr. Laural Golden, diverticulosis, hemorrhoids, 2 small polyps  . FISTULOGRAM N/A 08/16/2011   Procedure: FISTULOGRAM;  Surgeon: Angelia Mould, MD;  Location: [facility] CATH LAB;  Service: Cardiovascular;  Laterality: N/A;  . fistulogram left radial cephalic AV fistula  [phone]     Dr. Scot Dock  . HIP ARTHROPLASTY Right    "partial"  . JOINT REPLACEMENT     L knee- 2010- [facility], L hip partial replacement   . LUNG REMOVAL, PARTIAL  2003   partial, right  . NEPHRECTOMY  2001   left  . SHUNTOGRAM N/A 05/15/2012   Procedure: Earney Mallet;  Surgeon: Conrad Joiner, MD;  Location: [facility] CATH LAB;  Service: Cardiovascular;  Laterality: N/A;  .  SHUNTOGRAM N/A 05/09/2014   Procedure: FISTULOGRAM;  Surgeon: Conrad Oneida, MD;  Location: [facility] CATH LAB;  Service: Cardiovascular;  Laterality: N/A;   Family History:   Family History  Problem Relation Age of Onset  . Heart disease Mother   . Heart disease Father   . Heart attack Father   . Anesthesia problems Neg Hx   . Hypotension Neg Hx   . Malignant hyperthermia Neg Hx   . Pseudochol deficiency Neg Hx   . Colon cancer Neg Hx    Social History:  reports that he quit smoking about 42 years ago. His smoking use included cigarettes. He quit after 35.00 years of use. His smokeless tobacco use includes chew. He reports that  he does not drink alcohol and does not use drugs. No Known Allergies Prior to Admission medications   Medication Sig Start Date End Date Taking? Authorizing Provider  cinacalcet (SENSIPAR) 60 MG tablet Take 60 mg by mouth daily.   Yes [provider]  Fluticasone-Salmeterol (ADVAIR) 100-50 MCG/DOSE AEPB Inhale 1 puff into the lungs in the morning and at bedtime.  09/04/19  Yes [provider]  lidocaine-prilocaine (EMLA) cream Apply 1 application topically every Monday, Wednesday, and Friday with hemodialysis.  12/17/14  Yes [provider]  metoprolol succinate (TOPROL-XL) 25 MG 24 hr tablet Take 25 mg by mouth daily.   Yes [provider]  omeprazole (PRILOSEC) 20 MG capsule Take 20 mg by mouth daily.   Yes [provider]  oxyCODONE (OXY IR/ROXICODONE) 5 MG immediate release tablet Take 1 tablet (5 mg total) by mouth every 6 (six) hours as needed for severe pain. 10/05/19  Yes Kathie Dike, MD  Sennosides-Docusate Sodium 8.6-50 MG CAPS Take 1 capsule by mouth in the morning and at bedtime.   Yes [provider]  sevelamer carbonate (RENVELA) 800 MG tablet Take 800 mg by mouth 3 (three) times daily with meals.    Yes [provider]  VENTOLIN HFA 108 (90 Base) MCG/ACT inhaler Inhale 2 puffs into the lungs every 4 (four) hours as needed for wheezing or shortness of breath.  09/04/19  Yes [provider]  acetaminophen (TYLENOL) 325 MG tablet Take 2 tablets (650 mg total) by mouth every 6 (six) hours as needed for mild pain or moderate pain. 10/05/19   Kathie Dike, MD   No current facility-administered medications for this encounter.   Current Outpatient Medications  Medication Sig Dispense Refill  . cinacalcet (SENSIPAR) 60 MG tablet Take 60 mg by mouth daily.    . Fluticasone-Salmeterol (ADVAIR) 100-50 MCG/DOSE AEPB Inhale 1 puff into the lungs in the morning and at bedtime.     . lidocaine-prilocaine (EMLA) cream Apply 1  application topically every Monday, Wednesday, and Friday with hemodialysis.     [name] Kitchen metoprolol succinate (TOPROL-XL) 25 MG 24 hr tablet Take 25 mg by mouth daily.    [name] Kitchen omeprazole (PRILOSEC) 20 MG capsule Take 20 mg by mouth daily.    [name] Kitchen oxyCODONE (OXY IR/ROXICODONE) 5 MG immediate release tablet Take 1 tablet (5 mg total) by mouth every 6 (six) hours as needed for severe pain. 30 tablet 0  . Sennosides-Docusate Sodium 8.6-50 MG CAPS Take 1 capsule by mouth in the morning and at bedtime.    . sevelamer carbonate (RENVELA) 800 MG tablet Take 800 mg by mouth 3 (three) times daily with meals.     . VENTOLIN HFA 108 (90 Base) MCG/ACT inhaler Inhale 2 puffs into the lungs every  4 (four) hours as needed for wheezing or shortness of breath.     [name] Kitchen acetaminophen (TYLENOL) 325 MG tablet Take 2 tablets (650 mg total) by mouth every 6 (six) hours as needed for mild pain or moderate pain.     Labs: Basic Metabolic Panel: Recent Labs  Lab 11/21/19 0343 11/21/19 0348  NA 136 137  K 4.7 4.6  CL 98 97*  CO2 24  --   GLUCOSE 123* 121*  BUN 48* 42*  CREATININE 9.22* 9.90*  CALCIUM 7.5*  --    Liver Function Tests: Recent Labs  Lab 11/21/19 0343  AST 12*  ALT 13  ALKPHOS 150*  BILITOT 0.6  PROT 6.4*  ALBUMIN 3.1*   No results for input(s): LIPASE, AMYLASE in the last 168 hours. No results for input(s): AMMONIA in the last 168 hours. CBC: Recent Labs  Lab 11/21/19 0343 11/21/19 0348  WBC 5.1  --   NEUTROABS 3.7  --   HGB 9.3* 9.5*  HCT 29.2* 28.0*  MCV 101.0*  --   PLT 151  --    Cardiac Enzymes: No results for input(s): CKTOTAL, CKMB, CKMBINDEX, TROPONINI in the last 168 hours. CBG: No results for input(s): GLUCAP in the last 168 hours. Iron Studies: No results for input(s): IRON, TIBC, TRANSFERRIN, FERRITIN in the last 72 hours. Studies/Results: CT HEAD WO CONTRAST  Result Date: 11/21/2019 CLINICAL DATA:  Numbness in jerking of the left arm and leg since Friday. History of renal  and lung cancer. EXAM: CT HEAD WITHOUT CONTRAST TECHNIQUE: Contiguous axial images were obtained from the base of the skull through the vertex without intravenous contrast. COMPARISON:  None. FINDINGS: Brain: Vasogenic edema appearance in the left anterior frontal and right frontal parietal areas that is moderately extensive. There is a superimposed 1 cm high-density nodule in the left frontal lobe at the level of edema. No reported history of clinical infection. No evidence of infarct, hematoma, hydrocephalus, or collection. Generalized brain atrophy. Vascular: No hyperdense vessel or unexpected calcification. Skull: Negative Sinuses/Orbits: Negative IMPRESSION: Bilateral cerebral cytotoxic edema, history and multifocality favoring metastatic disease to the brain. Electronically Signed   By: Monte Fantasia M.D.   On: 11/21/2019 04:34   MR BRAIN WO CONTRAST  Result Date: 11/21/2019 CLINICAL DATA:  Small cell lung cancer.  Abnormal head CT EXAM: MRI HEAD WITHOUT CONTRAST TECHNIQUE: Multiplanar, multiecho pulse sequences of the brain and surrounding structures were obtained without intravenous contrast. COMPARISON:  Head CT from earlier today FINDINGS: Brain: T2 isointense anterior left frontal mass measuring 7 mm, with moderate vasogenic edema. The lesion has a ring like diffusion hyperintense appearance. There is active metastatic disease by May 2021 PET CT. High and anterior right parietal mass measuring 11 mm with extensive vasogenic edema. T2 and diffusion hyperintense lesion in the superior right cerebellum measuring 4 mm. No acute hemorrhage, hydrocephalus, collection, or acute infarct. Small remote right cerebellar infarction. Chronic small vessel ischemia in the cerebral white matter. Brain atrophy with ventriculomegaly. Vascular: Normal flow voids Skull and upper cervical spine: No focal marrow lesion is seen. Sinuses/Orbits: Negative Other: Motion degraded brain MRI. Noncontrast study.  There is chart  history of dialysis. IMPRESSION: Metastatic disease to the brain with left frontal and right parietal lesions measuring up to 11 mm and associated with moderate to extensive vasogenic edema. Probable third lesion in the superior right cerebellum measuring 4 mm. Electronically Signed   By: Monte Fantasia M.D.   On: 11/21/2019 08:03    ROS:  Pertinent items are noted in HPI. Physical Exam: Vitals:   11/21/19 0853 11/21/19 0900 11/21/19 1000 11/21/19 1100  BP:  (!) 143/65 (!) 158/88 (!) 142/63  Pulse: 69 73 80 72  Resp: 17 (!) 24 18 (!) 26  Temp:      TempSrc:      SpO2: 100% 98% 96% 98%  Weight:      Height:          Weight change:  No intake or output data in the 24 hours ending 11/21/19 1200 BP (!) 142/63   Pulse 72   Temp 98.2 F (36.8 C) (Oral)   Resp (!) 26   Ht 5\' 6"  (1.676 m)   Wt 81.6 kg   SpO2 98%   BMI 29.05 kg/m  General appearance: alert, cooperative and no distress Head: Normocephalic, without obvious abnormality, atraumatic Resp: clear to auscultation bilaterally Cardio: no rub GI: soft, non-tender; bowel sounds normal; no masses,  no organomegaly Extremities: edema trace pretibial edema and LUE AVF +T/B Dialysis Access:  Dialysis Orders: Center: [facility]  on MWF . EDW 80 kg HD Bath 2K/2.5Ca  Time 310 minutes Heparin none. Access LUE AVF BFR 400 DFR 600    Hectoral 1.5  mcg IV/HD Epogen 1200   Units IV/HD  Venofer  50 IV weekly   Assessment/Plan: 1.  Metastatic renal cell carcinoma- now with brain mets and likely etiology of weakness, AMS, and difficulty with ambulation.  To be transferred to [facility] 2.  ESRD -  Normally MWF schedule.  Unfortunately due to our high census we will not be able to perform HD here before his transfer so will need to have HD performed at [facility]. 3.  Hypertension/volume  - stable 4.  Anemia  -  stable 5.  Metabolic bone disease -   Continue with meds  6.  Nutrition -  Renal diet  Donetta Potts, MD [place] Pager [phone] 11/21/2019, 12:00 PM

## 2019-11-21 NOTE — H&P (Addendum)
History and Physical    [name] ZLD:[id number] DOB: [birth year] DOA: 11/21/2019  PCP: Sharilyn Sites, MD   Patient coming from: Home  Chief Complaint: Left-sided numbness and tingling  HPI: [name] is a 86 y.o. male with medical history significant for ESRD on HD MWF, COPD, bronchoalveolar carcinoma, renal carcinoma with prior left nephrectomy, hypertension, recent right greater trochanteric fracture, and GERD who was just discharged from his skilled nursing facility yesterday after rehab related to hip fracture.  He was actually noted to have some worsening weakness as well as left-sided upper and lower extremity numbness and tingling that began Friday of last week.  He was told by the physician at that facility that he may have had a TIA, but there were no focal neurological findings at that time.  He is now unable to stand and requires a significant amount of assistance for any kind of transfer.  This was quite concerning to family members and he was brought to the ED after he was noted to be "jerking" all of his limbs earlier this morning at around 2 AM.  This appears to have spontaneously resolved after a few minutes.  There is no mention of confusion, fevers, chills, trouble with speech, or trouble swallowing.  Patient denies any current complaints.  Patient has no prior history of seizure disorder.   ED Course: Stable vital signs noted and patient is afebrile.  Laboratory data with BUN 42 and creatinine 9.9, but he is a hemodialysis patient with last known dialysis session 7/12.  Hemoglobin 9.5.  CT of the head demonstrated bilateral cerebral cytotoxic edema and brain MRI demonstrates metastatic lesions to the left frontal and right parietal regions approximately 31millimeters in size along with a third lesion to the superior right cerebellar region of 4 mm in size surrounded by extensive vasogenic edema.  Patient has been given 10 mg of IV dexamethasone as well as 1000 mg of IV  Keppra.  Review of Systems: All others reviewed and otherwise negative except as noted above.  Past Medical History:  Diagnosis Date  . Arthritis    ankle , hip, knees , low back   . BPH (benign prostatic hypertrophy)   . Cancer ([place])    lung  . Cancer ([place]) 2001   renal  . Chronic inflammatory demyelinating polyneuropathy ([place]) 04/07/11   ,diagnosed 1992, tx. /w prednisone x 17 yrs. has been d/c for 3 yrs.   . Chronic kidney disease       . CIDP (chronic inflammatory demyelinating polyneuropathy) ([place])   . COPD (chronic obstructive pulmonary disease) ([place])    told that he has "a bit of emphysema"  . Dialysis patient ([facility])   . GERD (gastroesophageal reflux disease)   . Hypertension    stress test done - many years ago  . Kidney calculus   . Pneumonia     Past Surgical History:  Procedure Laterality Date  . ANKLE FUSION     R ankle  . AV FISTULA PLACEMENT  06/01/2011   Procedure: ARTERIOVENOUS (AV) FISTULA CREATION;  Surgeon: Angelia Mould, MD;  Location: [facility] OR;  Service: Vascular;  Laterality: Left;  Creation of Left Arteriovenous fistula  . AV FISTULA PLACEMENT Left 07/12/2012   Procedure: ARTERIOVENOUS (AV) FISTULA CREATION;  Surgeon: Conrad [place], MD;  Location: [facility];  Service: Vascular;  Laterality: Left;  . AV FISTULA PLACEMENT Left 4.11.14  . BASCILIC VEIN TRANSPOSITION Left 08/28/2012   Procedure: BASCILIC VEIN TRANSPOSITION;  Surgeon: Aaron Edelman  Starlyn Skeans, MD;  Location: [facility] OR;  Service: Vascular;  Laterality: Left;  left 2nd stage basilic vein transposition  . CHOLECYSTECTOMY    . COLONOSCOPY  2005   Dr. Laural Golden, diverticulosis, hemorrhoids, 2 small polyps  . FISTULOGRAM N/A 08/16/2011   Procedure: FISTULOGRAM;  Surgeon: Angelia Mould, MD;  Location: [facility] CATH LAB;  Service: Cardiovascular;  Laterality: N/A;  . fistulogram left radial cephalic AV fistula  [phone]     Dr. Scot Dock  . HIP ARTHROPLASTY Right    "partial"  . JOINT REPLACEMENT     L knee- 2010-  [facility], L hip partial replacement   . LUNG REMOVAL, PARTIAL  2003   partial, right  . NEPHRECTOMY  2001   left  . SHUNTOGRAM N/A 05/15/2012   Procedure: Earney Mallet;  Surgeon: Conrad [place], MD;  Location: [facility] CATH LAB;  Service: Cardiovascular;  Laterality: N/A;  . SHUNTOGRAM N/A 05/09/2014   Procedure: FISTULOGRAM;  Surgeon: Conrad Valinda, MD;  Location: [facility] CATH LAB;  Service: Cardiovascular;  Laterality: N/A;     reports that he quit smoking about 42 years ago. His smoking use included cigarettes. He quit after 35.00 years of use. His smokeless tobacco use includes chew. He reports that he does not drink alcohol and does not use drugs.  No Known Allergies  Family History  Problem Relation Age of Onset  . Heart disease Mother   . Heart disease Father   . Heart attack Father   . Anesthesia problems Neg Hx   . Hypotension Neg Hx   . Malignant hyperthermia Neg Hx   . Pseudochol deficiency Neg Hx   . Colon cancer Neg Hx     Prior to Admission medications   Medication Sig Start Date End Date Taking? Authorizing Provider  cinacalcet (SENSIPAR) 60 MG tablet Take 60 mg by mouth daily.   Yes [provider]  Fluticasone-Salmeterol (ADVAIR) 100-50 MCG/DOSE AEPB Inhale 1 puff into the lungs in the morning and at bedtime.  09/04/19  Yes [provider]  lidocaine-prilocaine (EMLA) cream Apply 1 application topically every Monday, Wednesday, and Friday with hemodialysis.  12/17/14  Yes [provider]  metoprolol succinate (TOPROL-XL) 25 MG 24 hr tablet Take 25 mg by mouth daily.   Yes [provider]  omeprazole (PRILOSEC) 20 MG capsule Take 20 mg by mouth daily.   Yes [provider]  oxyCODONE (OXY IR/ROXICODONE) 5 MG immediate release tablet Take 1 tablet (5 mg total) by mouth every 6 (six) hours as needed for severe pain. 10/05/19  Yes Kathie Dike, MD  Sennosides-Docusate Sodium 8.6-50 MG CAPS Take 1 capsule by mouth in the morning and at bedtime.    Yes [provider]  sevelamer carbonate (RENVELA) 800 MG tablet Take 800 mg by mouth 3 (three) times daily with meals.    Yes [provider]  VENTOLIN HFA 108 (90 Base) MCG/ACT inhaler Inhale 2 puffs into the lungs every 4 (four) hours as needed for wheezing or shortness of breath.  09/04/19  Yes [provider]  acetaminophen (TYLENOL) 325 MG tablet Take 2 tablets (650 mg total) by mouth every 6 (six) hours as needed for mild pain or moderate pain. 10/05/19   Kathie Dike, MD    Physical Exam: Vitals:   11/21/19 0853 11/21/19 0900 11/21/19 1000 11/21/19 1100  BP:  (!) 143/65 (!) 158/88 (!) 142/63  Pulse: 69 73 80 72  Resp: 17 (!) 24 18 (!) 26  Temp:  TempSrc:      SpO2: 100% 98% 96% 98%  Weight:      Height:        Constitutional: NAD, calm, comfortable Vitals:   11/21/19 0853 11/21/19 0900 11/21/19 1000 11/21/19 1100  BP:  (!) 143/65 (!) 158/88 (!) 142/63  Pulse: 69 73 80 72  Resp: 17 (!) 24 18 (!) 26  Temp:      TempSrc:      SpO2: 100% 98% 96% 98%  Weight:      Height:       Eyes: lids and conjunctivae normal ENMT: Mucous membranes are moist.  Neck: normal, supple Respiratory: clear to auscultation bilaterally. Normal respiratory effort. No accessory muscle use.  Cardiovascular: Regular rate and rhythm, no murmurs. No extremity edema. Abdomen: no tenderness, no distention. Bowel sounds positive.  Musculoskeletal:  No joint deformity upper and lower extremities.   Skin: no rashes, lesions, ulcers.  Psychiatric: Flat affect Neurological: Cranial nerves II through XII grossly intact with no focal findings to upper or lower extremities.  Labs on Admission: I have personally reviewed following labs and imaging studies  CBC: Recent Labs  Lab 11/21/19 0343 11/21/19 0348  WBC 5.1  --   NEUTROABS 3.7  --   HGB 9.3* 9.5*  HCT 29.2* 28.0*  MCV 101.0*  --   PLT 151  --    Basic Metabolic Panel: Recent Labs  Lab 11/21/19 0343  11/21/19 0348  NA 136 137  K 4.7 4.6  CL 98 97*  CO2 24  --   GLUCOSE 123* 121*  BUN 48* 42*  CREATININE 9.22* 9.90*  CALCIUM 7.5*  --    GFR: Estimated Creatinine Clearance: 5.3 mL/min (A) (by C-G formula based on SCr of 9.9 mg/dL (H)). Liver Function Tests: Recent Labs  Lab 11/21/19 0343  AST 12*  ALT 13  ALKPHOS 150*  BILITOT 0.6  PROT 6.4*  ALBUMIN 3.1*   No results for input(s): LIPASE, AMYLASE in the last 168 hours. No results for input(s): AMMONIA in the last 168 hours. Coagulation Profile: Recent Labs  Lab 11/21/19 0343  INR 1.0   Cardiac Enzymes: No results for input(s): CKTOTAL, CKMB, CKMBINDEX, TROPONINI in the last 168 hours. BNP (last 3 results) No results for input(s): PROBNP in the last 8760 hours. HbA1C: No results for input(s): HGBA1C in the last 72 hours. CBG: No results for input(s): GLUCAP in the last 168 hours. Lipid Profile: No results for input(s): CHOL, HDL, LDLCALC, TRIG, CHOLHDL, LDLDIRECT in the last 72 hours. Thyroid Function Tests: No results for input(s): TSH, T4TOTAL, FREET4, T3FREE, THYROIDAB in the last 72 hours. Anemia Panel: No results for input(s): VITAMINB12, FOLATE, FERRITIN, TIBC, IRON, RETICCTPCT in the last 72 hours. Urine analysis:    Component Value Date/Time   COLORURINE YELLOW 01/01/2009 1054   APPEARANCEUR CLEAR 01/01/2009 1054   LABSPEC 1.018 01/01/2009 1054   PHURINE 5.0 01/01/2009 1054   GLUCOSEU NEGATIVE 01/01/2009 1054   [place] 01/01/2009 1054   BILIRUBINUR NEGATIVE 01/01/2009 1054   [place] 01/01/2009 1054   PROTEINUR 100 (A) 01/01/2009 1054   UROBILINOGEN 0.2 01/01/2009 1054   NITRITE NEGATIVE 01/01/2009 1054   LEUKOCYTESUR NEGATIVE 01/01/2009 1054    Radiological Exams on Admission: CT HEAD WO CONTRAST  Result Date: 11/21/2019 CLINICAL DATA:  Numbness in jerking of the left arm and leg since Friday. History of renal and lung cancer. EXAM: CT HEAD WITHOUT CONTRAST TECHNIQUE:  Contiguous axial images were obtained from the base of  the skull through the vertex without intravenous contrast. COMPARISON:  None. FINDINGS: Brain: Vasogenic edema appearance in the left anterior frontal and right frontal parietal areas that is moderately extensive. There is a superimposed 1 cm high-density nodule in the left frontal lobe at the level of edema. No reported history of clinical infection. No evidence of infarct, hematoma, hydrocephalus, or collection. Generalized brain atrophy. Vascular: No hyperdense vessel or unexpected calcification. Skull: Negative Sinuses/Orbits: Negative IMPRESSION: Bilateral cerebral cytotoxic edema, history and multifocality favoring metastatic disease to the brain. Electronically Signed   By: Monte Fantasia M.D.   On: 11/21/2019 04:34   MR BRAIN WO CONTRAST  Result Date: 11/21/2019 CLINICAL DATA:  Small cell lung cancer.  Abnormal head CT EXAM: MRI HEAD WITHOUT CONTRAST TECHNIQUE: Multiplanar, multiecho pulse sequences of the brain and surrounding structures were obtained without intravenous contrast. COMPARISON:  Head CT from earlier today FINDINGS: Brain: T2 isointense anterior left frontal mass measuring 7 mm, with moderate vasogenic edema. The lesion has a ring like diffusion hyperintense appearance. There is active metastatic disease by May 2021 PET CT. High and anterior right parietal mass measuring 11 mm with extensive vasogenic edema. T2 and diffusion hyperintense lesion in the superior right cerebellum measuring 4 mm. No acute hemorrhage, hydrocephalus, collection, or acute infarct. Small remote right cerebellar infarction. Chronic small vessel ischemia in the cerebral white matter. Brain atrophy with ventriculomegaly. Vascular: Normal flow voids Skull and upper cervical spine: No focal marrow lesion is seen. Sinuses/Orbits: Negative Other: Motion degraded brain MRI. Noncontrast study.  There is chart history of dialysis. IMPRESSION: Metastatic disease to the  brain with left frontal and right parietal lesions measuring up to 11 mm and associated with moderate to extensive vasogenic edema. Probable third lesion in the superior right cerebellum measuring 4 mm. Electronically Signed   By: Monte Fantasia M.D.   On: 11/21/2019 08:03    EKG: Independently reviewed. SR 73bpm. LAFB, no new changes.  Assessment/Plan Active Problems:   Metastasis to brain [facility])    Metastatic brain lesions with vasogenic edema -Likely source of neurological sequelae -Patient given loading dose of Keppra as well as Decadron in ED -Discussed case with Dr. Rory Percy with neurology who will assess patient on arrival to [facility] with further recommendations at that time -Continue careful monitoring and will recommend Ativan as needed for any seizure activity for now -Appreciate oncology input  ESRD on HD MWF -Hemodialysis per nephrology likely to resume tomorrow given high volume -Last hemodialysis 7/12 -Renal diet -Continue Renvela and Sensipar  Hypertension-stable -Continue metoprolol  Anemia of chronic disease related to above-stable -Continue to follow repeat labs  Renal cell carcinoma along with bronchoalveolar carcinoma with metastatic disease -Appreciate oncology evaluation, discussed with Dr. Benay Spice  GERD -Protonix  History of COPD -No active bronchospasms currently noted -Duo nebs as needed -Continue on Dulera  Recent right periprosthetic hip fracture -Continue pain management as prior -Recently discharged from SNF   DVT prophylaxis:SCDs Code Status: DNR Family Communication: Niece [name] Education officer, community) at bedside Disposition Plan:Transfer to [facility] for further Neurology and Oncology evaluation Consults called:Spoke with Dr. Rory Percy and Dr. Benay Spice who are both aware and will see pt, also Dr. Marval Regal Admission status: Inpatient, Tele  Status is: Observation  The patient will require care spanning > 2 midnights and should be moved to inpatient  because: Ongoing diagnostic testing needed not appropriate for outpatient work up, IV treatments appropriate due to intensity of illness or inability to take PO and Inpatient level of care  appropriate due to severity of illness  Dispo: The patient is from: Home              Anticipated d/c is to: Home              Anticipated d/c date is: 2 days              Patient currently is not medically stable to d/c.  Keajah Killough D Manuella Ghazi DO [facility]  If 7PM-7AM, please contact night-coverage [email or web address]  11/21/2019, 12:23 PM

## 2019-11-21 NOTE — ED Triage Notes (Signed)
Pt c/o numbness and jerking with left arm and left leg since Friday. Pt states he was recently discharged from facility back home and they told him that he was probably having mini strokes.

## 2019-11-22 DIAGNOSIS — R569 Unspecified convulsions: Secondary | ICD-10-CM

## 2019-11-22 LAB — CBC
HCT: 29 % — ABNORMAL LOW (ref 39.0–52.0)
Hemoglobin: 9.2 g/dL — ABNORMAL LOW (ref 13.0–17.0)
MCH: 32.1 pg (ref 26.0–34.0)
MCHC: 31.7 g/dL (ref 30.0–36.0)
MCV: 101 fL — ABNORMAL HIGH (ref 80.0–100.0)
Platelets: 153 10*3/uL (ref 150–400)
RBC: 2.87 MIL/uL — ABNORMAL LOW (ref 4.22–5.81)
RDW: 13.1 % (ref 11.5–15.5)
WBC: 6.2 10*3/uL (ref 4.0–10.5)
nRBC: 0 % (ref 0.0–0.2)

## 2019-11-22 LAB — BASIC METABOLIC PANEL
Anion gap: 12 (ref 5–15)
BUN: 63 mg/dL — ABNORMAL HIGH (ref 8–23)
CO2: 22 mmol/L (ref 22–32)
Calcium: 7.4 mg/dL — ABNORMAL LOW (ref 8.9–10.3)
Chloride: 98 mmol/L (ref 98–111)
Creatinine, Ser: 10.86 mg/dL — ABNORMAL HIGH (ref 0.61–1.24)
GFR calc Af Amer: 4 mL/min — ABNORMAL LOW (ref >60)
GFR calc non Af Amer: 4 mL/min — ABNORMAL LOW (ref >60)
Glucose, Bld: 232 mg/dL — ABNORMAL HIGH (ref 70–99)
Potassium: 5.8 mmol/L — ABNORMAL HIGH (ref 3.5–5.1)
Sodium: 132 mmol/L — ABNORMAL LOW (ref 135–145)

## 2019-11-22 LAB — HEMOGLOBIN A1C
Hgb A1c MFr Bld: 5.9 % — ABNORMAL HIGH (ref 4.8–5.6)
Mean Plasma Glucose: 122.63 mg/dL

## 2019-11-22 LAB — CBG MONITORING, ED
Glucose-Capillary: 124 mg/dL — ABNORMAL HIGH (ref 70–99)
Glucose-Capillary: 132 mg/dL — ABNORMAL HIGH (ref 70–99)

## 2019-11-22 LAB — HEPATITIS B SURFACE ANTIGEN: Hepatitis B Surface Ag: NONREACTIVE

## 2019-11-22 LAB — HEPATITIS B SURFACE ANTIBODY,QUALITATIVE: Hep B S Ab: NONREACTIVE

## 2019-11-22 LAB — MAGNESIUM: Magnesium: 2 mg/dL (ref 1.7–2.4)

## 2019-11-22 MED ORDER — INSULIN ASPART 100 UNIT/ML ~~LOC~~ SOLN
0.0000 [IU] | Freq: Every day | SUBCUTANEOUS | Status: DC
  Administered 2019-11-23 – 2019-11-27 (×4): 2 [IU] via SUBCUTANEOUS

## 2019-11-22 MED ORDER — CHLORHEXIDINE GLUCONATE CLOTH 2 % EX PADS
6.0000 | MEDICATED_PAD | Freq: Every day | CUTANEOUS | Status: DC
  Administered 2019-11-23 – 2019-11-28 (×6): 6 via TOPICAL

## 2019-11-22 MED ORDER — INSULIN ASPART 100 UNIT/ML ~~LOC~~ SOLN
0.0000 [IU] | Freq: Three times a day (TID) | SUBCUTANEOUS | Status: DC
  Administered 2019-11-22 (×2): 1 [IU] via SUBCUTANEOUS
  Administered 2019-11-23: 5 [IU] via SUBCUTANEOUS
  Administered 2019-11-23: 1 [IU] via SUBCUTANEOUS
  Administered 2019-11-23: 2 [IU] via SUBCUTANEOUS
  Administered 2019-11-24: 1 [IU] via SUBCUTANEOUS
  Administered 2019-11-24: 3 [IU] via SUBCUTANEOUS
  Administered 2019-11-24 – 2019-11-25 (×2): 2 [IU] via SUBCUTANEOUS
  Administered 2019-11-25: 5 [IU] via SUBCUTANEOUS
  Administered 2019-11-25: 2 [IU] via SUBCUTANEOUS
  Administered 2019-11-26: 3 [IU] via SUBCUTANEOUS
  Administered 2019-11-27: 2 [IU] via SUBCUTANEOUS
  Administered 2019-11-27: 5 [IU] via SUBCUTANEOUS
  Administered 2019-11-27: 3 [IU] via SUBCUTANEOUS
  Administered 2019-11-28: 2 [IU] via SUBCUTANEOUS
  Filled 2019-11-22 (×2): qty 1

## 2019-11-22 MED ORDER — SODIUM ZIRCONIUM CYCLOSILICATE 5 G PO PACK
10.0000 g | PACK | Freq: Once | ORAL | Status: AC
  Administered 2019-11-22: 10 g via ORAL
  Filled 2019-11-22: qty 2

## 2019-11-22 NOTE — Procedures (Signed)
Hemodialysis Procedure Note  SUBJECTIVE Transferred from [facility]. Admit for work up of seizures/metastatic brain lesions with vasogenic edema. Tolerating treatment thus far, no complaints.  I was present at this dialysis session. I have reviewed the session itself and made appropriate changes.   OBJECTIVE: Dialysis Rx: 2K, 2.25Ca, 137Na, BFR 400, DFR 800 Dialyzer: F180 Treatment Time: 3.5 hours  Filed Weights   11/21/19 0318  Weight: 81.6 kg   BP 119/63, HR 63, QBF 375 Gen: NAD, dialyzing in stretcher Resp: cta bl, normal wob/unlabored CV: s1s2, rrr Ext: no edema Access:  Recent Labs  Lab 11/22/19 0205  NA 132*  K 5.8*  CL 98  CO2 22  GLUCOSE 232*  BUN 63*  CREATININE 10.86*  CALCIUM 7.4*    Recent Labs  Lab 11/21/19 0343 11/21/19 0348 11/22/19 0205  WBC 5.1  --  6.2  NEUTROABS 3.7  --   --   HGB 9.3* 9.5* 9.2*  HCT 29.2* 28.0* 29.0*  MCV 101.0*  --  101.0*  PLT 151  --  153    Scheduled Meds: . Chlorhexidine Gluconate Cloth  6 each Topical Q0600  . [START ON 11/23/2019] Chlorhexidine Gluconate Cloth  6 each Topical Q0600  . dexamethasone (DECADRON) injection  4 mg Intravenous Q6H  . insulin aspart  0-5 Units Subcutaneous QHS  . insulin aspart  0-9 Units Subcutaneous TID WC  . lidocaine-prilocaine  1 application Topical Q M,W,F-HD  . metoprolol succinate  25 mg Oral Daily  . mometasone-formoterol  2 puff Inhalation BID  . pantoprazole  40 mg Oral Daily  . sevelamer carbonate  800 mg Oral TID WC  . sodium chloride flush  3 mL Intravenous Q12H   Continuous Infusions: . sodium chloride    . levETIRAcetam 500 mg (11/22/19 1924)   PRN Meds:.sodium chloride, acetaminophen **OR** acetaminophen, albuterol, ipratropium-albuterol, LORazepam, ondansetron **OR** ondansetron (ZOFRAN) IV, oxyCODONE, sodium chloride flush    A/P 1. ESRD on Intermittent Hemodialysis -Outpatient unit [facility] (MWF) -will follow up to see if we can get him back on his MWF  schedule or do dialysis again on Saturday 7/17 and then back on his MWF starting Mon 7/19 -UF goal: 3.5L as tolerated -Adjust medications for a GFR <10 -Avoid nephrotoxic agents -K restricted diet  2. Secondary Hyperparathyroidism/bone mineral metabolism -check phos  3. Anemia of chronic kidney disease -check iron panel, hold ESAs for now given active malignancy/metastasis  Gean Quint, MD [facility] 11/22/2019, 9:46 PM

## 2019-11-22 NOTE — Progress Notes (Signed)
PROGRESS NOTE    [name]  POE:[id number] DOB: 1930-09-19 DOA: 11/21/2019 PCP: Sharilyn Sites, MD   Brief Narrative:  Per HPI: [name] is a 86 y.o. male with medical history significant for ESRD on HD MWF, COPD, bronchoalveolar carcinoma, renal carcinoma with prior left nephrectomy, hypertension, recent right greater trochanteric fracture, and GERD who was just discharged from his skilled nursing facility yesterday after rehab related to hip fracture.  He was actually noted to have some worsening weakness as well as left-sided upper and lower extremity numbness and tingling that began Friday of last week.  He was told by the physician at that facility that he may have had a TIA, but there were no focal neurological findings at that time.  He is now unable to stand and requires a significant amount of assistance for any kind of transfer.  This was quite concerning to family members and he was brought to the ED after he was noted to be "jerking" all of his limbs earlier this morning at around 2 AM.  This appears to have spontaneously resolved after a few minutes.  There is no mention of confusion, fevers, chills, trouble with speech, or trouble swallowing.  Patient denies any current complaints.  Patient has no prior history of seizure disorder.   ED Course: Stable vital signs noted and patient is afebrile.  Laboratory data with BUN 42 and creatinine 9.9, but he is a hemodialysis patient with last known dialysis session 7/12.  Hemoglobin 9.5.  CT of the head demonstrated bilateral cerebral cytotoxic edema and brain MRI demonstrates metastatic lesions to the left frontal and right parietal regions approximately 66millimeters in size along with a third lesion to the superior right cerebellar region of 4 mm in size surrounded by extensive vasogenic edema.  Patient has been given 10 mg of IV dexamethasone as well as 1000 mg of IV Keppra.  -Patient plan to have hemodialysis per nephrology today.   Continue IV Keppra and dexamethasone as prescribed with further evaluation per specialty services after transfer to [facility].  Assessment & Plan:   Active Problems:   Metastasis to brain [facility])   Seizure ([place])   Metastatic brain lesions with vasogenic edema -Likely source of neurological sequelae -Patient given loading dose of Keppra as well as Decadron in ED with ongoing Keppra and Decadron doses per neurology recommendations initiated -Discussed case with Dr. Rory Percy with neurology who will assess patient on arrival to [facility] with further recommendations at that time -Continue careful monitoring and will recommend Ativan as needed for any seizure activity for now -Appreciate oncology input -Discussed case with Dr. Christella Noa per neurology recommendation.  He had spoken with [facility] POA and discussed the need for radiation treatment.  No need for surgical intervention necessary at this time.  ESRD on HD MWF with associated hyperkalemia -Received [facility] 7/15 -Hemodialysis per nephrology today -Last hemodialysis 7/12 -Renal diet -Continue Renvela and Sensipar  Hypertension-stable -Continue metoprolol  Anemia of chronic disease related to above-stable -Continue to follow repeat labs  Renal cell carcinoma along with bronchoalveolar carcinoma with metastatic disease -Appreciate oncology evaluation, discussed with Dr. Benay Spice -We will require radiation treatments  GERD -Protonix  History of COPD -No active bronchospasms currently noted -Duo nebs as needed -Continue on Dulera  Recent right periprosthetic hip fracture -Continue pain management as prior -Recently discharged from SNF  Steroid-induced hyperglycemia -Improved with initiation of SSI   DVT prophylaxis: SCDs Code Status: DNR Family Communication: Spoke with niece Nadara Eaton  7/15 Disposition Plan: Transfer to [facility] for neurology, oncology, and neurosurgery evaluation  Status is: Inpatient  Remains  inpatient appropriate because:Ongoing diagnostic testing needed not appropriate for outpatient work up and IV treatments appropriate due to intensity of illness or inability to take PO   Dispo: The patient is from: Home              Anticipated d/c is to: Home              Anticipated d/c date is: 3 days              Patient currently is not medically stable to d/c.  Consultants:   Neurology  Oncology  Discussed with neurosurgery, no need for formal consultation  Palliative Care  Procedures:   See below  Antimicrobials:   None   Subjective: Patient seen and evaluated today with no new acute complaints or concerns. No acute concerns or events noted overnight.  Objective: Vitals:   11/22/19 0500 11/22/19 0530 11/22/19 0700 11/22/19 0730  BP: (!) 128/56 (!) 119/53 (!) 143/63 (!) 145/65  Pulse:   67 64  Resp: 13 13 18 18   Temp:      TempSrc:      SpO2:   100% 100%  Weight:      Height:       No intake or output data in the 24 hours ending 11/22/19 0921 Filed Weights   11/21/19 0318  Weight: 81.6 kg    Examination:  General exam: Appears calm and comfortable  Respiratory system: Clear to auscultation. Respiratory effort normal. Cardiovascular system: S1 & S2 heard, RRR. No JVD, murmurs, rubs, gallops or clicks. No pedal edema. Gastrointestinal system: Abdomen is nondistended, soft and nontender. No organomegaly or masses felt. Normal bowel sounds heard. Central nervous system: Alert and oriented. No focal neurological deficits. Extremities: Symmetric 5 x 5 power. Skin: No rashes, lesions or ulcers Psychiatry: Judgement and insight appear normal. Mood & affect appropriate.     Data Reviewed: I have personally reviewed following labs and imaging studies  CBC: Recent Labs  Lab 11/21/19 0343 11/21/19 0348 11/22/19 0205  WBC 5.1  --  6.2  NEUTROABS 3.7  --   --   HGB 9.3* 9.5* 9.2*  HCT 29.2* 28.0* 29.0*  MCV 101.0*  --  101.0*  PLT 151  --  948    Basic Metabolic Panel: Recent Labs  Lab 11/21/19 0343 11/21/19 0348 11/22/19 0205  NA 136 137 132*  K 4.7 4.6 5.8*  CL 98 97* 98  CO2 24  --  22  GLUCOSE 123* 121* 232*  BUN 48* 42* 63*  CREATININE 9.22* 9.90* 10.86*  CALCIUM 7.5*  --  7.4*  MG  --   --  2.0   GFR: Estimated Creatinine Clearance: 4.8 mL/min (A) (by C-G formula based on SCr of 10.86 mg/dL (H)). Liver Function Tests: Recent Labs  Lab 11/21/19 0343  AST 12*  ALT 13  ALKPHOS 150*  BILITOT 0.6  PROT 6.4*  ALBUMIN 3.1*   No results for input(s): LIPASE, AMYLASE in the last 168 hours. No results for input(s): AMMONIA in the last 168 hours. Coagulation Profile: Recent Labs  Lab 11/21/19 0343  INR 1.0   Cardiac Enzymes: No results for input(s): CKTOTAL, CKMB, CKMBINDEX, TROPONINI in the last 168 hours. BNP (last 3 results) No results for input(s): PROBNP in the last 8760 hours. HbA1C: No results for input(s): HGBA1C in the last 72 hours. CBG: Recent Labs  Lab 11/22/19 0815  GLUCAP 124*   Lipid Profile: No results for input(s): CHOL, HDL, LDLCALC, TRIG, CHOLHDL, LDLDIRECT in the last 72 hours. Thyroid Function Tests: No results for input(s): TSH, T4TOTAL, FREET4, T3FREE, THYROIDAB in the last 72 hours. Anemia Panel: No results for input(s): VITAMINB12, FOLATE, FERRITIN, TIBC, IRON, RETICCTPCT in the last 72 hours. Sepsis Labs: No results for input(s): PROCALCITON, LATICACIDVEN in the last 168 hours.  Recent Results (from the past 240 hour(s))  SARS Coronavirus 2 by RT PCR (hospital order, performed in [facility] hospital lab) Nasopharyngeal Nasopharyngeal Swab     Status: None   Collection Time: 11/21/19  6:21 AM   Specimen: Nasopharyngeal Swab  Result Value Ref Range Status   SARS Coronavirus 2 NEGATIVE NEGATIVE Final    Comment: (NOTE) SARS-CoV-2 target nucleic acids are NOT DETECTED.  The SARS-CoV-2 RNA is generally detectable in upper and lower respiratory specimens during the acute  phase of infection. The lowest concentration of SARS-CoV-2 viral copies this assay can detect is 250 copies / mL. A negative result does not preclude SARS-CoV-2 infection and should not be used as the sole basis for treatment or other patient management decisions.  A negative result may occur with improper specimen collection / handling, submission of specimen other than nasopharyngeal swab, presence of viral mutation(s) within the areas targeted by this assay, and inadequate number of viral copies (<250 copies / mL). A negative result must be combined with clinical observations, patient history, and epidemiological information.  Fact Sheet for Patients:   StrictlyIdeas.no  Fact Sheet for Healthcare Providers: BankingDealers.co.za  This test is not yet approved or  cleared by the [place] FDA and has been authorized for detection and/or diagnosis of SARS-CoV-2 by FDA under an Emergency Use Authorization (EUA).  This EUA will remain in effect (meaning this test can be used) for the duration of the COVID-19 declaration under Section 564(b)(1) of the Act, 21 U.S.C. section 360bbb-3(b)(1), unless the authorization is terminated or revoked sooner.  Performed at [facility], [address], [place], [place] [address]          Radiology Studies: CT HEAD WO CONTRAST  Result Date: 11/21/2019 CLINICAL DATA:  Numbness in jerking of the left arm and leg since Friday. History of renal and lung cancer. EXAM: CT HEAD WITHOUT CONTRAST TECHNIQUE: Contiguous axial images were obtained from the base of the skull through the vertex without intravenous contrast. COMPARISON:  None. FINDINGS: Brain: Vasogenic edema appearance in the left anterior frontal and right frontal parietal areas that is moderately extensive. There is a superimposed 1 cm high-density nodule in the left frontal lobe at the level of edema. No reported history of clinical infection.  No evidence of infarct, hematoma, hydrocephalus, or collection. Generalized brain atrophy. Vascular: No hyperdense vessel or unexpected calcification. Skull: Negative Sinuses/Orbits: Negative IMPRESSION: Bilateral cerebral cytotoxic edema, history and multifocality favoring metastatic disease to the brain. Electronically Signed   By: Monte Fantasia M.D.   On: 11/21/2019 04:34   MR BRAIN WO CONTRAST  Result Date: 11/21/2019 CLINICAL DATA:  Small cell lung cancer.  Abnormal head CT EXAM: MRI HEAD WITHOUT CONTRAST TECHNIQUE: Multiplanar, multiecho pulse sequences of the brain and surrounding structures were obtained without intravenous contrast. COMPARISON:  Head CT from earlier today FINDINGS: Brain: T2 isointense anterior left frontal mass measuring 7 mm, with moderate vasogenic edema. The lesion has a ring like diffusion hyperintense appearance. There is active metastatic disease by May 2021 PET CT. High and anterior  right parietal mass measuring 11 mm with extensive vasogenic edema. T2 and diffusion hyperintense lesion in the superior right cerebellum measuring 4 mm. No acute hemorrhage, hydrocephalus, collection, or acute infarct. Small remote right cerebellar infarction. Chronic small vessel ischemia in the cerebral white matter. Brain atrophy with ventriculomegaly. Vascular: Normal flow voids Skull and upper cervical spine: No focal marrow lesion is seen. Sinuses/Orbits: Negative Other: Motion degraded brain MRI. Noncontrast study.  There is chart history of dialysis. IMPRESSION: Metastatic disease to the brain with left frontal and right parietal lesions measuring up to 11 mm and associated with moderate to extensive vasogenic edema. Probable third lesion in the superior right cerebellum measuring 4 mm. Electronically Signed   By: Monte Fantasia M.D.   On: 11/21/2019 08:03        Scheduled Meds: . cinacalcet  60 mg Oral Q breakfast  . dexamethasone (DECADRON) injection  4 mg Intravenous Q6H  .  insulin aspart  0-5 Units Subcutaneous QHS  . insulin aspart  0-9 Units Subcutaneous TID WC  . lidocaine-prilocaine  1 application Topical Q M,W,F-HD  . metoprolol succinate  25 mg Oral Daily  . mometasone-formoterol  2 puff Inhalation BID  . pantoprazole  40 mg Oral Daily  . sevelamer carbonate  800 mg Oral TID WC  . sodium chloride flush  3 mL Intravenous Q12H   Continuous Infusions: . sodium chloride    . levETIRAcetam 500 mg (11/22/19 0811)     LOS: 1 day    Time spent: 35 minutes    [name] Bagdasarian D Manuella Ghazi, DO [facility]  If 7PM-7AM, please contact night-coverage [email or web address] 11/22/2019, 9:21 AM

## 2019-11-22 NOTE — Progress Notes (Signed)
[facility] Progress Note  Name: [name] MRN: [id number] DOB: [birth year]   Subjective:  He is still in the ER awaiting transfer to [facility].  Didn't have HD yesterday and HD orders are in for today.  He got lokelma for K. Unfortunately had a banana this AM.  We discussed.   Review of systems:  Denies n/v Reports shortness of breath  No cp   No intake or output data in the 24 hours ending 11/22/19 1035  Vitals:  Vitals:   11/22/19 0910 11/22/19 0911 11/22/19 0930 11/22/19 1000  BP:   (!) 147/60 (!) 129/56  Pulse:      Resp: (!) 24 18    Temp:      TempSrc:      SpO2:      Weight:      Height:         Physical Exam:  General adult male in bed in no acute distress HEENT normocephalic atraumatic extraocular movements intact sclera anicteric Neck supple trachea midline Lungs clear to auscultation bilaterally normal work of breathing at rest on room air Heart S1S2 no rub Abdomen soft nontender nondistended Extremities no pitting edema  Psych normal mood and affect AVF in place with bruit and thrill  Neuro  Awake and conversant, alert and oriented  Medications reviewed   Labs:  BMP Latest Ref Rng & Units 11/22/2019 11/21/2019 11/21/2019  Glucose 70 - 99 mg/dL 232(H) 121(H) 123(H)  BUN 8 - 23 mg/dL 63(H) 42(H) 48(H)  Creatinine 0.61 - 1.24 mg/dL 10.86(H) 9.90(H) 9.22(H)  Sodium 135 - 145 mmol/L 132(L) 137 136  Potassium 3.5 - 5.1 mmol/L 5.8(H) 4.6 4.7  Chloride 98 - 111 mmol/L 98 97(L) 98  CO2 22 - 32 mmol/L 22 - 24  Calcium 8.9 - 10.3 mg/dL 7.4(L) - 7.5(L)     Dialysis Orders: Center: [facility]  on MWF . EDW 80 kg HD Bath 2K/2.5Ca  Time 310 minutes Heparin none. Access LUE AVF BFR 400 DFR 600    Hectoral 1.5  mcg IV/HD Epogen 1200   Units IV/HD  Venofer  50 IV weekly   Assessment/Plan: 1.  Metastatic renal cell carcinoma- now with brain mets and likely etiology of weakness, AMS, and difficulty with ambulation.  To be transferred to [facility] 2.   ESRD -  Normally MWF schedule.  HD today, 7/15 then would recommend transitioning back to MWF schedule.  Note hyperkalemia 3.  Hypertension/volume  - optimize volume with HD 4.  Anemia  -  on epogen outpatient however note with renal cell now with brain mets.  Hold ESA for now. No acute indication for PRBC's 5.  Metabolic bone disease -   Continue with binders and hectoral and note sensipar ordered.  Given calcium will hold sensipar for now.   6.  Nutrition -  Renal diet - reinforced - he had a banana this AM  Claudia Desanctis, MD 11/22/2019 11:33 AM

## 2019-11-22 NOTE — Progress Notes (Signed)
Patient arrived on the unit via stretcher with medic.  A & O x 4.  Pleasant and verbal. Placed into his bed by medic transport.  Able to voice all needs clearly and effectively.  Answered all questions appropriately. No obvious signs of acute distress or discomfort noted on admission.

## 2019-11-22 NOTE — Plan of Care (Signed)
Care plan has been started.

## 2019-11-22 NOTE — Consult Note (Signed)
NEURO HOSPITALIST CONSULT NOTE   Requesting physician: Dr. Cathlean Sauer  Reason for Consult: Brain metastases and seizure-like activity  History obtained from:  Patient and Chart    HPI:                                                                                                                                          [name] is an 86 y.o. male with a PMHx of bronchoalveolar carcinoma, renal carcinoma s/p left nephrectomy, ESRD on HD, CIDP, COPD, HTN, recent right greater trochanteric fracture, who presented initially to the [facility] for further assessment of worsening weakness as well as left-sided upper and lower extremity numbness and tingling that began Friday of last week. He states that on Friday, he also had "3 or 4 seizures" that involved jerking of his left side. Per notes in Epic, a TIA was suspected by the facility physician, as he had "no focal neurological findings at that time". He was discharged home from his SNF, where he was residing while recovering from the right hip fracture, on Friday. At home, he had another seizure like spell at about 2 AM on Wednesday; family members brought him to the ED after the spell, which involved jerking of all of his limbs and had spontaneously resolved after a few minutes. He has no prior history of seizures.   At the [facility] ED a head CT was obtained, revealing findings suspicious for bilateral cerebral cytotoxic edema. An MRI was then obtained, which demonstrated metastatic lesions to the left frontal and right parietal regions approximately 11 mm in size, along with a third lesion to the superior right cerebellar region of 4 mm in size surrounded by extensive vasogenic edema. The patient was started on IV Dexamethasone 4 mg q6h after a load of 10 mg IV. He also was started on Keppra 500 mg IV BID after a load of 1000 mg IV.   Past Medical History:  Diagnosis Date  . Arthritis    ankle , hip, knees , low back   . BPH (benign  prostatic hypertrophy)   . Cancer ([place])    lung  . Cancer ([place]) 2001   renal  . Chronic inflammatory demyelinating polyneuropathy ([place]) 04/07/11   ,diagnosed 1992, tx. /w prednisone x 17 yrs. has been d/c for 3 yrs.   . Chronic kidney disease       . CIDP (chronic inflammatory demyelinating polyneuropathy) ([place])   . COPD (chronic obstructive pulmonary disease) ([place])    told that he has "a bit of emphysema"  . Dialysis patient ([place])   . GERD (gastroesophageal reflux disease)   . Hypertension    stress test done - many years ago  . Kidney calculus   . Pneumonia     Past  Surgical History:  Procedure Laterality Date  . ANKLE FUSION     R ankle  . AV FISTULA PLACEMENT  06/01/2011   Procedure: ARTERIOVENOUS (AV) FISTULA CREATION;  Surgeon: Angelia Mould, MD;  Location: [facility] OR;  Service: Vascular;  Laterality: Left;  Creation of Left Arteriovenous fistula  . AV FISTULA PLACEMENT Left 07/12/2012   Procedure: ARTERIOVENOUS (AV) FISTULA CREATION;  Surgeon: Conrad Oakwood, MD;  Location: [place];  Service: Vascular;  Laterality: Left;  . AV FISTULA PLACEMENT Left 4.11.14  . BASCILIC VEIN TRANSPOSITION Left 08/28/2012   Procedure: BASCILIC VEIN TRANSPOSITION;  Surgeon: Conrad Duncombe, MD;  Location: [place];  Service: Vascular;  Laterality: Left;  left 2nd stage basilic vein transposition  . CHOLECYSTECTOMY    . COLONOSCOPY  2005   Dr. Laural Golden, diverticulosis, hemorrhoids, 2 small polyps  . FISTULOGRAM N/A 08/16/2011   Procedure: FISTULOGRAM;  Surgeon: Angelia Mould, MD;  Location: [facility] CATH LAB;  Service: Cardiovascular;  Laterality: N/A;  . fistulogram left radial cephalic AV fistula  [phone]     Dr. Scot Dock  . HIP ARTHROPLASTY Right    "partial"  . JOINT REPLACEMENT     L knee- 2010- [facility], L hip partial replacement   . LUNG REMOVAL, PARTIAL  2003   partial, right  . NEPHRECTOMY  2001   left  . SHUNTOGRAM N/A 05/15/2012   Procedure: Earney Mallet;  Surgeon: Conrad Pueblo Pintado, MD;   Location: [facility] CATH LAB;  Service: Cardiovascular;  Laterality: N/A;  . SHUNTOGRAM N/A 05/09/2014   Procedure: FISTULOGRAM;  Surgeon: Conrad Hideout, MD;  Location: [facility] CATH LAB;  Service: Cardiovascular;  Laterality: N/A;    Family History  Problem Relation Age of Onset  . Heart disease Mother   . Heart disease Father   . Heart attack Father   . Anesthesia problems Neg Hx   . Hypotension Neg Hx   . Malignant hyperthermia Neg Hx   . Pseudochol deficiency Neg Hx   . Colon cancer Neg Hx               Social History:  reports that he quit smoking about 42 years ago. His smoking use included cigarettes. He quit after 35.00 years of use. His smokeless tobacco use includes chew. He reports that he does not drink alcohol and does not use drugs.  No Known Allergies  MEDICATIONS:                                                                                                                     Prior to Admission:  Medications Prior to Admission  Medication Sig Dispense Refill Last Dose  . cinacalcet (SENSIPAR) 60 MG tablet Take 60 mg by mouth daily.   11/20/2019 at Unknown time  . Fluticasone-Salmeterol (ADVAIR) 100-50 MCG/DOSE AEPB Inhale 1 puff into the lungs in the morning and at bedtime.    11/20/2019 at Unknown time  . lidocaine-prilocaine (EMLA) cream Apply 1 application topically every Monday, Wednesday, and Friday with  hemodialysis.    11/19/2019  . metoprolol succinate (TOPROL-XL) 25 MG 24 hr tablet Take 25 mg by mouth daily.   11/20/2019 at 0800  . omeprazole (PRILOSEC) 20 MG capsule Take 20 mg by mouth daily.   11/20/2019 at Unknown time  . oxyCODONE (OXY IR/ROXICODONE) 5 MG immediate release tablet Take 1 tablet (5 mg total) by mouth every 6 (six) hours as needed for severe pain. 30 tablet 0 11/20/2019 at Unknown time  . Sennosides-Docusate Sodium 8.6-50 MG CAPS Take 1 capsule by mouth in the morning and at bedtime.   11/20/2019 at Unknown time  . sevelamer carbonate (RENVELA) 800 MG tablet  Take 800 mg by mouth 3 (three) times daily with meals.    11/20/2019 at Unknown time  . VENTOLIN HFA 108 (90 Base) MCG/ACT inhaler Inhale 2 puffs into the lungs every 4 (four) hours as needed for wheezing or shortness of breath.    Past Week at Unknown time  . acetaminophen (TYLENOL) 325 MG tablet Take 2 tablets (650 mg total) by mouth every 6 (six) hours as needed for mild pain or moderate pain.   unknown at PRN   Scheduled: . Chlorhexidine Gluconate Cloth  6 each Topical Q0600  . [START ON 11/23/2019] Chlorhexidine Gluconate Cloth  6 each Topical Q0600  . dexamethasone (DECADRON) injection  4 mg Intravenous Q6H  . insulin aspart  0-5 Units Subcutaneous QHS  . insulin aspart  0-9 Units Subcutaneous TID WC  . lidocaine-prilocaine  1 application Topical Q M,W,F-HD  . metoprolol succinate  25 mg Oral Daily  . mometasone-formoterol  2 puff Inhalation BID  . pantoprazole  40 mg Oral Daily  . sevelamer carbonate  800 mg Oral TID WC  . sodium chloride flush  3 mL Intravenous Q12H   Continuous: . sodium chloride    . levETIRAcetam 500 mg (11/22/19 1924)    ROS:                                                                                                                                       As per HPI. Comprehensive ROS otherwise negative.    Blood pressure (!) 134/51, pulse 62, temperature 97.8 F (36.6 C), temperature source Oral, resp. rate 18, height 5\' 6"  (1.676 m), weight 81.6 kg, SpO2 98 %.   General Examination:  Physical Exam  HEENT-  [place]/AT    Lungs- Respirations unlabored Extremities- Warm and well perfused  Neurological Examination Mental Status: Awake and alert. Fully oriented. Thought content appropriate.  Speech fluent with intact comprehension and naming.  Cranial Nerves: II: Visual fields intact all 4 quadrants bilaterally. No extinction to DSS. PERRL.   III,IV, VI:  Mild esotropia. No nystagmus. Horizontal EOM are full.  V,VII: Smile symmetric, facial temp sensation equal bilaterally VIII: Hearing intact to conversation IX,X: No hypophonia XI: Symmetric XII: Midline tongue extension Motor: RUE with subtle 4+/5 weakness RLE 4+/5 hip flexion and knee extension. ADF/APF 5/5 LUE and LLE 5/5 Sensory: Temp and light touch sensation intact in bilateral upper and lower extremities. No extinction to DSS.   Deep Tendon Reflexes:  1+ bilateral brachioradialis and biceps.  Hypoactive patellae in the context of prior surgeries.  0 achilles bilaterally  Plantars: Right: downgoing   Left: downgoing Cerebellar: No ataxia with FNF bilaterally, but with dysmetria on the left. Prominent action tremor is present bilaterally.  Gait: Deferred   Lab Results: Basic Metabolic Panel: Recent Labs  Lab 11/21/19 0343 11/21/19 0348 11/22/19 0205  NA 136 137 132*  K 4.7 4.6 5.8*  CL 98 97* 98  CO2 24  --  22  GLUCOSE 123* 121* 232*  BUN 48* 42* 63*  CREATININE 9.22* 9.90* 10.86*  CALCIUM 7.5*  --  7.4*  MG  --   --  2.0    CBC: Recent Labs  Lab 11/21/19 0343 11/21/19 0348 11/22/19 0205  WBC 5.1  --  6.2  NEUTROABS 3.7  --   --   HGB 9.3* 9.5* 9.2*  HCT 29.2* 28.0* 29.0*  MCV 101.0*  --  101.0*  PLT 151  --  153    Cardiac Enzymes: No results for input(s): CKTOTAL, CKMB, CKMBINDEX, TROPONINI in the last 168 hours.  Lipid Panel: No results for input(s): CHOL, TRIG, HDL, CHOLHDL, VLDL, LDLCALC in the last 168 hours.  Imaging: CT HEAD WO CONTRAST  Result Date: 11/21/2019 CLINICAL DATA:  Numbness in jerking of the left arm and leg since Friday. History of renal and lung cancer. EXAM: CT HEAD WITHOUT CONTRAST TECHNIQUE: Contiguous axial images were obtained from the base of the skull through the vertex without intravenous contrast. COMPARISON:  None. FINDINGS: Brain: Vasogenic edema appearance in the left anterior frontal and right frontal parietal areas  that is moderately extensive. There is a superimposed 1 cm high-density nodule in the left frontal lobe at the level of edema. No reported history of clinical infection. No evidence of infarct, hematoma, hydrocephalus, or collection. Generalized brain atrophy. Vascular: No hyperdense vessel or unexpected calcification. Skull: Negative Sinuses/Orbits: Negative IMPRESSION: Bilateral cerebral cytotoxic edema, history and multifocality favoring metastatic disease to the brain. Electronically Signed   By: Monte Fantasia M.D.   On: 11/21/2019 04:34   MR BRAIN WO CONTRAST  Result Date: 11/21/2019 CLINICAL DATA:  Small cell lung cancer.  Abnormal head CT EXAM: MRI HEAD WITHOUT CONTRAST TECHNIQUE: Multiplanar, multiecho pulse sequences of the brain and surrounding structures were obtained without intravenous contrast. COMPARISON:  Head CT from earlier today FINDINGS: Brain: T2 isointense anterior left frontal mass measuring 7 mm, with moderate vasogenic edema. The lesion has a ring like diffusion hyperintense appearance. There is active metastatic disease by May 2021 PET CT. High and anterior right parietal mass measuring 11 mm with extensive vasogenic edema. T2 and diffusion hyperintense lesion in the superior right cerebellum measuring 4 mm. No acute  hemorrhage, hydrocephalus, collection, or acute infarct. Small remote right cerebellar infarction. Chronic small vessel ischemia in the cerebral white matter. Brain atrophy with ventriculomegaly. Vascular: Normal flow voids Skull and upper cervical spine: No focal marrow lesion is seen. Sinuses/Orbits: Negative Other: Motion degraded brain MRI. Noncontrast study.  There is chart history of dialysis. IMPRESSION: Metastatic disease to the brain with left frontal and right parietal lesions measuring up to 11 mm and associated with moderate to extensive vasogenic edema. Probable third lesion in the superior right cerebellum measuring 4 mm. Electronically Signed   By: Monte Fantasia M.D.   On: 11/21/2019 08:03    Assessment: 86 year old male with a history of renal and lung cancer, presenting with seizure like activity. MRI brain reveals bilateral cerebral metastases.  1. Neurological exam with mild right sided weakness and mild LUE dysmetria. Prominent bilateral action tremor is also noted.  2. MRI brain: Metastatic disease to the brain with left frontal and right parietal lesions measuring up to 11 mm and associated with moderate to extensive vasogenic edema. Probable third lesion in the superior right cerebellum measuring 4 mm. 3. ESRD on HD.   Recommendations: 1. Change Keppra dosing to renal dosing schedule: 500 mg IV qd with 250 mg IV after each dialysis session.  2. EEG in AM 3. Management of electrolyte disturbance per Nephrology 4. Oncology consultation.  5. May benefit from palliative gamma knife treatment of cerebral metastases.   Electronically signed: Dr. Kerney Elbe 11/22/2019, 7:53 PM

## 2019-11-23 DIAGNOSIS — C7931 Secondary malignant neoplasm of brain: Principal | ICD-10-CM

## 2019-11-23 DIAGNOSIS — Z66 Do not resuscitate: Secondary | ICD-10-CM

## 2019-11-23 DIAGNOSIS — Z7189 Other specified counseling: Secondary | ICD-10-CM

## 2019-11-23 DIAGNOSIS — J441 Chronic obstructive pulmonary disease with (acute) exacerbation: Secondary | ICD-10-CM

## 2019-11-23 DIAGNOSIS — Z515 Encounter for palliative care: Secondary | ICD-10-CM

## 2019-11-23 DIAGNOSIS — K219 Gastro-esophageal reflux disease without esophagitis: Secondary | ICD-10-CM

## 2019-11-23 DIAGNOSIS — C3490 Malignant neoplasm of unspecified part of unspecified bronchus or lung: Secondary | ICD-10-CM

## 2019-11-23 DIAGNOSIS — N186 End stage renal disease: Secondary | ICD-10-CM

## 2019-11-23 DIAGNOSIS — C649 Malignant neoplasm of unspecified kidney, except renal pelvis: Secondary | ICD-10-CM

## 2019-11-23 LAB — BASIC METABOLIC PANEL
Anion gap: 12 (ref 5–15)
BUN: 38 mg/dL — ABNORMAL HIGH (ref 8–23)
CO2: 26 mmol/L (ref 22–32)
Calcium: 7 mg/dL — ABNORMAL LOW (ref 8.9–10.3)
Chloride: 98 mmol/L (ref 98–111)
Creatinine, Ser: 6.52 mg/dL — ABNORMAL HIGH (ref 0.61–1.24)
GFR calc Af Amer: 8 mL/min — ABNORMAL LOW (ref >60)
GFR calc non Af Amer: 7 mL/min — ABNORMAL LOW (ref >60)
Glucose, Bld: 271 mg/dL — ABNORMAL HIGH (ref 70–99)
Potassium: 4.2 mmol/L (ref 3.5–5.1)
Sodium: 136 mmol/L (ref 135–145)

## 2019-11-23 LAB — GLUCOSE, CAPILLARY
Glucose-Capillary: 210 mg/dL — ABNORMAL HIGH (ref 70–99)
Glucose-Capillary: 124 mg/dL — ABNORMAL HIGH (ref 70–99)
Glucose-Capillary: 175 mg/dL — ABNORMAL HIGH (ref 70–99)
Glucose-Capillary: 254 mg/dL — ABNORMAL HIGH (ref 70–99)
Glucose-Capillary: 183 mg/dL — ABNORMAL HIGH (ref 70–99)

## 2019-11-23 NOTE — Evaluation (Signed)
Occupational Therapy Evaluation Patient Details Name: [name] MRN: [id number] DOB: [birth year] Today's Date: 11/23/2019    History of Present Illness 86 y.o. male with a PMHx of bronchoalveolar carcinoma, renal carcinoma s/p left nephrectomy, ESRD on HD, CIDP, COPD, HTN, recent right greater trochanteric fracture, who presented initially to the [facility] ED for further assessment of worsening weakness as well as left-sided upper and lower extremity numbness and tingling that began Friday of last week. He states that on Friday, he also had "3 or 4 seizures" that involved jerking of his left side. Per notes in Epic, a TIA was suspected by the facility physician, as he had "no focal neurological findings at that time". He was discharged home from his SNF, where he was residing while recovering from the right hip fracture, on Friday   Clinical Impression   [name] is an 86 year old man found to have brain mets from renal carcinoma. On evaluation he presents with mild sensation and coordination deficits of left upper extremity, generalized weakness, decreased activity tolerance and impaired balance resulting in decreased ability to perform independent ADLs and functional mobility. Patient reports returning home recently from SNF (7/13) and "doing good" but returning to hospital secondary to seizures. Patient reports his legs are weak. Patient presents with normal cognition and reports wanting to go home with assistance of his niece and nephew - however 24/7 assistance is not guaranteed. If 24/7 assistance is set up patient can go home with [facility] OT and PT services. If not, patient should return to facility for continued rehab.    Follow Up Recommendations  Home health OT;SNF    Equipment Recommendations  3 in 1 bedside commode (unsure if he has a BSC)    Recommendations for Other Services       Precautions / Restrictions Precautions Precautions: Fall Precaution Comments: hx of  seizures Restrictions Weight Bearing Restrictions: No      Mobility Bed Mobility Overal bed mobility: Needs Assistance Bed Mobility: Supine to Sit;Sit to Supine     Supine to sit: Min guard Sit to supine: Min guard   General bed mobility comments: Increased time and use of bed rails for transfer.  Transfers Overall transfer level: Needs assistance Equipment used: Rolling walker (2 wheeled) Transfers: Sit to/from Stand Sit to Stand: Min guard         General transfer comment: Min guard for sit to stand. Min assist for ambulation with walker for steadying and safety. Patient relying heavily on use of arms on walker and sink. Patient appears short of breath but o2 sats maintained in high 90s. Reports this is normal for him.    Balance Overall balance assessment: Needs assistance Sitting-balance support: No upper extremity supported;Feet supported Sitting balance-Leahy Scale: Good     Standing balance support: During functional activity Standing balance-Leahy Scale: Fair Standing balance comment: Heavy Reliance on upper extremities.                           ADL either performed or assessed with clinical judgement   ADL Overall ADL's : Needs assistance/impaired Eating/Feeding: Independent   Grooming: Wash/dry hands;Standing;Min guard Grooming Details (indicate cue type and reason): Washed hands standing at sink. Leaning heavily on sink to support himself. Upper Body Bathing: Sitting;Set up   Lower Body Bathing: Minimal assistance;Sit to/from stand;Set up;Min guard   Upper Body Dressing : Sitting;Minimal assistance Upper Body Dressing Details (indicate cue type and reason): Min assist  for fasteners Lower Body Dressing: Min guard;Sit to/from stand;Set up Lower Body Dressing Details (indicate cue type and reason): Patient able to donn socks seated at side of bed with increased time and difficulty - patient bringing legs up onto bed to complete fully. Patient  winded and fatigued after task. Toilet Transfer: Minimal assistance;RW;Grab bars Armed forces technical officer Details (indicate cue type and reason): Patient ambulated to bathroom with RW with min assist. Gait slow and patient reports difficulty with rightk knee. Min assist for safety due to patient's mild unsteadiness and appearance of fatigue and dyspnea. Heavy use of grab bar to stand from toilet surface. Toileting- Water quality scientist and Hygiene: Min guard;Sit to/from stand Toileting - Clothing Manipulation Details (indicate cue type and reason): min guard for toileting     Functional mobility during ADLs: Minimal assistance;Rolling walker       Vision Baseline Vision/History: Wears glasses Additional Comments: Reports no new visual deficits. Appears functional.     Perception     Praxis      Pertinent Vitals/Pain Pain Assessment: No/denies pain     Hand Dominance Right   Extremity/Trunk Assessment Upper Extremity Assessment Upper Extremity Assessment: RUE deficits/detail;LUE deficits/detail RUE Deficits / Details: grossly functional shoulder ROM, 4/5 strength in shoulder and elbow and wrist (reports wrist sore from fall), 4-/5 grip strength. Digits 3-5 with decreased ROM (unable to fully extend) reports this is chronic for several years. RUE Sensation: WNL RUE Coordination: WNL;decreased fine motor (hx of decreased Fritch secondary to ROM deficits in digits 3-5.) LUE Deficits / Details: WFL ROM in LUE, 5/5 strength in shoulder, bicep, tricep, wrist, 4/5 grip strength. Reports he has to concentrate to maintain his strength. LUE Sensation: decreased light touch (Decreased sensation in all of LUE and in scapular area. Reports it feels different or "funny." Does not describe as dull however. Reports numbness and tingling in left hand - reports he has numbness and tingling in hand since dialysis port placed) LUE Coordination: decreased gross motor (Mild ataxia of LUE with finger to nose and  finger to finger - with about an inch of dysmetria. Reports he will knock things over if he isn't concentrating.)   Lower Extremity Assessment Lower Extremity Assessment: Defer to PT evaluation   Cervical / Trunk Assessment Cervical / Trunk Assessment: Kyphotic   Communication Communication Communication: No difficulties   Cognition Arousal/Alertness: Awake/alert Behavior During Therapy: WFL for tasks assessed/performed Overall Cognitive Status: Within Functional Limits for tasks assessed                                     General Comments       Exercises     Shoulder Instructions      Home Living Family/patient expects to be discharged to:: Private residence Living Arrangements: Alone Available Help at Discharge: Family;Available PRN/intermittently Type of Home: House Home Access: Ramped entrance     Home Layout: One level     Bathroom Shower/Tub: Occupational psychologist: Standard Bathroom Accessibility: Yes   Home Equipment: Environmental consultant - 2 wheels;Cane - single point;Shower seat;Bedside commode;Wheelchair - manual   Additional Comments: Reports decline in function since May 2021 after fall, chronic right knee pain.      Prior Functioning/Environment          Comments: Reports discharging from rehab on Tuesday (7/13) from nursing home. His nephew's wife was set up to assist him at home. When asked  if 24/7 assistance - he states "we were gonna see what I needed." Reports walking with a walker at nursing home and doing good with ADLs though hadn't done much on Tuesday before he had the seizure.        OT Problem List: Decreased strength;Decreased activity tolerance;Impaired balance (sitting and/or standing);Decreased coordination;Impaired UE functional use;Impaired sensation;Decreased knowledge of use of DME or AE      OT Treatment/Interventions: Self-care/ADL training;Therapeutic exercise;Neuromuscular education;Patient/family  education;Balance training;Therapeutic activities;DME and/or AE instruction    OT Goals(Current goals can be found in the care plan section) Acute Rehab OT Goals Patient Stated Goal: To be strong enough to go home OT Goal Formulation: With patient Time For Goal Achievement: 12/07/19 Potential to Achieve Goals: Good  OT Frequency: Min 2X/week   Barriers to D/C:            Co-evaluation              AM-PAC OT "6 Clicks" Daily Activity     Outcome Measure Help from another person eating meals?: None Help from another person taking care of personal grooming?: A Little Help from another person toileting, which includes using toliet, bedpan, or urinal?: A Little Help from another person bathing (including washing, rinsing, drying)?: A Little Help from another person to put on and taking off regular upper body clothing?: A Little Help from another person to put on and taking off regular lower body clothing?: A Little 6 Click Score: 19   End of Session Equipment Utilized During Treatment: Rolling walker;Gait belt Nurse Communication:  (okay to see per Rn)  Activity Tolerance: Patient tolerated treatment well Patient left: in bed;with call bell/phone within reach;with bed alarm set  OT Visit Diagnosis: Unsteadiness on feet (R26.81);Other abnormalities of gait and mobility (R26.89);Repeated falls (R29.6);Muscle weakness (generalized) (M62.81)                Time: 1027-2536 OT Time Calculation (min): 40 min Charges:  OT General Charges $OT Visit: 1 Visit OT Evaluation $OT Eval Moderate Complexity: 1 Mod OT Treatments $Self Care/Home Management : 8-22 mins  Atina Feeley, OTR/L [place] [phone] Pager: [phone]   Lenward Chancellor 11/23/2019, 4:58 PM

## 2019-11-23 NOTE — Progress Notes (Signed)
Admit: 11/21/2019 LOS: 2  89M ESRD MWF [facility] with Seizures and new brain mets, hx/o RCC   Subjective:  . HD overnight ,uneventful . Working with palliative care . No c/o this AM   07/15 0701 - 07/16 0700 In: 100 [IV Piggyback:100] Out: 1691   Filed Weights   11/21/19 0318 11/22/19 2054  Weight: 81.6 kg 85 kg    Scheduled Meds: . Chlorhexidine Gluconate Cloth  6 each Topical Q0600  . Chlorhexidine Gluconate Cloth  6 each Topical Q0600  . dexamethasone (DECADRON) injection  4 mg Intravenous Q6H  . insulin aspart  0-5 Units Subcutaneous QHS  . insulin aspart  0-9 Units Subcutaneous TID WC  . lidocaine-prilocaine  1 application Topical Q M,W,F-HD  . metoprolol succinate  25 mg Oral Daily  . mometasone-formoterol  2 puff Inhalation BID  . pantoprazole  40 mg Oral Daily  . sevelamer carbonate  800 mg Oral TID WC  . sodium chloride flush  3 mL Intravenous Q12H   Continuous Infusions: . sodium chloride    . levETIRAcetam Stopped (11/23/19 0718)   PRN Meds:.sodium chloride, acetaminophen **OR** acetaminophen, albuterol, ipratropium-albuterol, LORazepam, ondansetron **OR** ondansetron (ZOFRAN) IV, oxyCODONE, sodium chloride flush  Current Labs: reviewed    Physical Exam:  Blood pressure (!) 128/59, pulse 75, temperature 98.1 F (36.7 C), temperature source Oral, resp. rate 18, height 5\' 6"  (1.676 m), weight 85 kg, SpO2 100 %. NAD, conversant RRR  CTAB LUE AVF +B/T, bandaged No sig [name]  A 1. ESRD MWF AVF [facility], off schedule, HD overnight 2. Metastatic RCC, new brain metastases, per neuro/oncology 3. Seizures, neuro; Keppra 4. CKD-BMD, cont binders 5. Anemia, hold ESA, Hb 9s 6. Hyperkalemia, resolved  P . HD tomorrow then back on schedule next week: 3.5h, 2K, AVF, no heparin, 2L UF . FE panel with HD . Medication Issues; o Preferred narcotic agents for pain control are hydromorphone, fentanyl, and methadone. Morphine should not be used.   o Baclofen should be avoided o Avoid oral sodium phosphate and magnesium citrate based laxatives / bowel preps    Pearson Grippe MD 11/23/2019, 1:24 PM  Recent Labs  Lab 11/21/19 0343 11/21/19 0343 11/21/19 0348 11/22/19 0205 11/23/19 0951  NA 136   < > 137 132* 136  K 4.7   < > 4.6 5.8* 4.2  CL 98   < > 97* 98 98  CO2 24  --   --  22 26  GLUCOSE 123*   < > 121* 232* 271*  BUN 48*   < > 42* 63* 38*  CREATININE 9.22*   < > 9.90* 10.86* 6.52*  CALCIUM 7.5*  --   --  7.4* 7.0*   < > = values in this interval not displayed.   Recent Labs  Lab 11/21/19 0343 11/21/19 0348 11/22/19 0205  WBC 5.1  --  6.2  NEUTROABS 3.7  --   --   HGB 9.3* 9.5* 9.2*  HCT 29.2* 28.0* 29.0*  MCV 101.0*  --  101.0*  PLT 151  --  153

## 2019-11-23 NOTE — Consult Note (Addendum)
Palliative Medicine Inpatient Consult Note  Reason for consult:  Goals of Care "cancer with brain mets, goals of care"  HPI:  Per intake H&P --> 86 y.o. 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 Plans for transfer to [facility] for neurology and oncology evaluation. Follows with Dr. Benay Spice.   Palliative care was asked to get involved to aid in goals of care conversations in the setting of metastatic disease.  Clinical Assessment/Goals of Care: I have reviewed medical records including EPIC notes, labs and imaging, received report from bedside RN, assessed the patient.    I met with [name], [name] (niece), and [name] (relative) to further discuss diagnosis prognosis, GOC, EOL wishes, disposition and options.   I introduced Palliative Medicine as specialized medical care for people living with serious illness. It focuses on providing relief from the symptoms and stress of a serious illness. The goal is to improve quality of life for both the patient and the family.  I asked [name] to tell me about himself. [name] shares that [name] is from [place], [place]. [name] was born in raised in the [place] area [name]. [name] is married though his wife presently lives at [place], as she suffers from Alzheimer's dementia. [name] use to work at a [employer]". [name] enjoys baseball, reading his newspaper, and spending time with his family.  [name] has very functional prior to his recent six week stay at [facility]. It  was after this stay that [name] started experiencing seizures which are what led to the discovery of the brain lesions. At home [name] has [name] during the day hours. When [name] discharges home this time the plan will be for care during the evening hours as well.   We discussed hi cancer diagnosis dating back to 2001 when [name] was diagnosed with renal cancer then in 2003 when [name] had a section of his lung removed for lung cancer. [name] states, "they took it out and it came back."   A detailed discussion was had today regarding advanced directives, there are at his neices home. She plans to bring a copy to scan into [facility].    Concepts specific to code status, artifical feeding and hydration, continued IV antibiotics and rehospitalization was had.    I completed a MOST form today. The patient and family outlined their wishes for the following treatment decisions:  Cardiopulmonary Resuscitation: Do Not Attempt Resuscitation (DNR/No CPR)  Medical Interventions: Limited Additional Interventions: Use medical treatment, IV fluids and cardiac monitoring as indicated, DO NOT USE intubation or mechanical ventilation. May consider use of less invasive airway support such as BiPAP or CPAP. Also provide comfort measures. Transfer to the hospital if indicated. Avoid intensive care.   Antibiotics: Antibiotics if indicated  IV Fluids: No IV fluids (provide other measures to ensure comfort)  Feeding Tube: No feeding tube   The difference between a aggressive medical intervention path  and a palliative comfort care path for this patient at this time was had. Haadi feels that [name] does have a reasonably good quality of life at this  time. [name] shares that HD can be overwhelming at times though overall [name] wants to continue living.   We discussed the worst case circumstances of his proposed radiation starting to cause more of a burden than a benefit [name] felt at which time [name] would then elect to consider hospice more actively. [name] is aware  that if hospice were selected [name] would stop active cancer treatments and HD. [name] shares that presently [name] does not feel ready to do this. His niece is present and vocalizes wanting to do whatever Keiji wants moving forward.   We discussed having patient seen by OP Palliative therapy, [name] was apparently set up to see [facility] prior to admission. We discussed that if at any point the treatments become too difficult the transition of focus would shift towards hospice.  Patients niece, [name] was interested to speak to Oncology to get a better impression regarding radiation treatments.  Discussed the importance of continued conversation with family and their  medical providers regarding overall plan of care and treatment options, ensuring decisions are within the context of the patients values and GOCs.  Provided "Hard Choices for [employer]" booklet.   Decision Maker: Patient can make decisions for himself.   SUMMARY OF RECOMMENDATIONS   DNAR/DNI  MOST Completed, paper copy placed onto the chart electric copy can be found in the media section of epic  DNR Form Completed, paper copy placed onto the chart electric copy can be found in the media section of epic  Patient is in agreement with Palliative radiation for tx of cerebral  Patient being follow by [facility]  Code Status/Advance Care Planning: DNAR/DNI   Symptom Management:  Generalized Weakness:  -PT/OT   Palliative Prophylaxis:   Oral care, Mobility  Additional Recommendations (Limitations, Scope, Preferences): Continue current scope of care   Psycho-social/Spiritual:   Desire for further Chaplaincy support: Yes  Additional Recommendations: Education on    Prognosis: Likely poor in the setting of metastatic disease though patient would like a trial of palliative radiation  Discharge Planning: Discharge plan is yet to be determined  PPS: 40%   This conversation/these recommendations were discussed with  patient primary care team, Dr. Cathlean Sauer  Time In: 0940 Time Out: 1050 Total Time: 84 Greater than 50%  of this time was spent counseling and coordinating care related to the above assessment and plan.  [facility] Team Team Cell Phone: [phone] Please utilize secure chat with additional questions, if there is no response within 30 minutes please call the above phone number  Palliative Medicine Team providers are available by phone from 7am to 7pm daily and can be reached through the team cell phone.  Should this patient require assistance outside of these hours, please call the patient's attending physician.

## 2019-11-23 NOTE — Progress Notes (Signed)
PROGRESS NOTE    VILAS EDGERLY  FMB:[id number] DOB: 1931-01-08 DOA: 11/21/2019 PCP: Sharilyn Sites, MD    Brief Narrative:  Patient admitted to the hospital with the working diagnosis of new metastatic brain lesions with vosegenic edema, in the setting of bronchoalveolar carcinoma. (transferred from [facility] to [facility] on 11/22/19)  86 year old male with past medical history for end-stage renal disease on hemodialysis, COPD, bronchoalveolar carcinoma, hypertension, renal cell carcinoma status post left nephrectomy, GERD, and recent right greater trochanteric fracture.  Patient reported about 5 days of left sided weakness and paresthesias.  Patient had progressive symptoms to the point where he was having difficulty ambulating.  On the day of admission he was noted to have jerking movements, generalized, but self resolved after a few minutes.  No loss of consciousness.  On his initial physical examination blood pressure 143/65, heart rate 73, respiratory 24-26, oxygen saturation 98%.  His lungs are clear to auscultation bilaterally, heart S1-S2, present rhythmic, soft abdomen, no lower extremity edema.  He had decreased strength left upper extremity. Sodium 136, potassium 4.7, chloride 98, bicarb 24, glucose 123, BUN 48, creatinine 9.22, AST 12, ALT 13, white count 5.1, hemoglobin 9.3, hematocrit 39.2, platelets 151.  SARS COVID-19 negative.   Head CT with bilateral cerebral cytotoxic edema.   Brain MRI with metastatic disease of the brain with a left frontal and right parietal lesions measuring up to 11 mm associated with moderate to extensive vasogenic edema.  Possible third lesion in the superior right cerebellum, 4 mm. EKG 73 bpm, left axis deviation, left anterior fascicular block, first-degree AV block, normal QTC, sinus rhythm, no ST segment or T wave changes.  Patient has been placed on Keppra for seizure control. No further seizures. Neurosurgery recommended radiation therapy, no indication for surgical  intervention.   Oncology has been consulted along with nephrology and palliative care.    Assessment & Plan:   Principal Problem:   Metastasis to brain [facility]) Active Problems:   End stage renal disease (HCC)   COPD (chronic obstructive pulmonary disease) (HCC)   Hypertension   GERD (gastroesophageal reflux disease)   Periprosthetic fracture around internal prosthetic right hip joint ([place]) 10/04/2019   Seizure ([place])   Bronchoalveolar carcinoma ([place])   Renal cell carcinoma ([place])   1. New onset brain metastatic lesions, complicated vasogenic brain edema and seizures. Patient continue to have left upper extremity dysmetria. No recurrent seizures.  Patient will continue with Keppra and dexamethasone. Will follow EEG result along with neurology recommendations. Continue neuro checks per unit protocol.   Consult PT, OT and encourage to be out of bed to chair tid with meals.   2. ESRD on HD. hyperkalemia. Clinically euvolemic, will check renal function today. Continue sevelamer.   Continue to follow nephrology recommendations for maintenance dialysis.   3. HTN. Continue blood pressure control with metoprolol.   4. Bronchoalveolar carcinoma/ metastatic renal cell carcinoma. Patient follows with Dr. Benay Spice.  Old records personally reviewed, last visit 06/08. Diagnoses with metastatic renal cell carcinoma, bilateral lung nodules and hypermetabolic mediastinal node. Plan was to hold on chemotherapy and continue close observation with repeat CT in 3 to 4 months interval.   4. Right greater trochanter hip fracture. Old records personally reviewed, orthopedic follow up on -7/06, healing lesion well.   5. COPD. No signs of exacerbation, continue with as needed bronchodilator therapy. Continue dulera.   6. T2DM. Continue with insulin sliding scale for glucose cover and monitoring. Patient is tolerating po well.  Patient continue to be at high risk for recurrent seizure.   Status is:  Inpatient  Remains inpatient appropriate because:Inpatient level of care appropriate due to severity of illness   Dispo: The patient is from: Home              Anticipated d/c is to: Home              Anticipated d/c date is: 2 days              Patient currently is not medically stable to d/c.    DVT prophylaxis: Enoxaparin   Code Status:   dnr   Family Communication: I spoke with patient's family at the bedside, we talked in detail about patient's condition, plan of care and prognosis and all questions were addressed.      Consultants:   Oncology   Neurosurgery   Neurology   Nephrology   Procedures:     Antimicrobials:       Subjective: Patient continue to have left hand dysmetria, no nausea or vomiting, no headache or chest pain .  Objective: Vitals:   11/23/19 0031 11/23/19 0120 11/23/19 0143 11/23/19 0415  BP: (!) 105/54 (!) 107/50 (!) 109/53 (!) 135/57  Pulse:  65 66 61  Resp: 18 18 18 17   Temp:  98 F (36.7 C) 98 F (36.7 C) (!) 97.4 F (36.3 C)  TempSrc:  Oral Oral Oral  SpO2:  97% 98% 97%  Weight:      Height:        Intake/Output Summary (Last 24 hours) at 11/23/2019 0811 Last data filed at 11/23/2019 0740 Gross per 24 hour  Intake 331.44 ml  Output 1691 ml  Net -1359.56 ml   Filed Weights   11/21/19 0318 11/22/19 2054  Weight: 81.6 kg 85 kg    Examination:   General: Not in pain or dyspnea. Deconditioned  Neurology: Awake and alert, left hand dysmetria, strength is preserved.  E ENT: mild pallor, no icterus, oral mucosa moist Cardiovascular: No JVD. S1-S2 present, rhythmic, no gallops, rubs, or murmurs. Trace lower extremity edema. Pulmonary: positive breath sounds bilaterally, adequate air movement, no wheezing, rhonchi or rales. Gastrointestinal. Abdomen with, no organomegaly, non tender, no rebound or guarding Skin. No rashes Musculoskeletal: no joint deformities     Data Reviewed: I have personally reviewed following labs  and imaging studies  CBC: Recent Labs  Lab 11/21/19 0343 11/21/19 0348 11/22/19 0205  WBC 5.1  --  6.2  NEUTROABS 3.7  --   --   HGB 9.3* 9.5* 9.2*  HCT 29.2* 28.0* 29.0*  MCV 101.0*  --  101.0*  PLT 151  --  025   Basic Metabolic Panel: Recent Labs  Lab 11/21/19 0343 11/21/19 0348 11/22/19 0205  NA 136 137 132*  K 4.7 4.6 5.8*  CL 98 97* 98  CO2 24  --  22  GLUCOSE 123* 121* 232*  BUN 48* 42* 63*  CREATININE 9.22* 9.90* 10.86*  CALCIUM 7.5*  --  7.4*  MG  --   --  2.0   GFR: Estimated Creatinine Clearance: 4.9 mL/min (A) (by C-G formula based on SCr of 10.86 mg/dL (H)). Liver Function Tests: Recent Labs  Lab 11/21/19 0343  AST 12*  ALT 13  ALKPHOS 150*  BILITOT 0.6  PROT 6.4*  ALBUMIN 3.1*   No results for input(s): LIPASE, AMYLASE in the last 168 hours. No results for input(s): AMMONIA in the last 168 hours. Coagulation Profile: Recent  Labs  Lab 11/21/19 0343  INR 1.0   Cardiac Enzymes: No results for input(s): CKTOTAL, CKMB, CKMBINDEX, TROPONINI in the last 168 hours. BNP (last 3 results) No results for input(s): PROBNP in the last 8760 hours. HbA1C: Recent Labs    11/22/19 0205  HGBA1C 5.9*   CBG: Recent Labs  Lab 11/22/19 0815 11/22/19 1709 11/23/19 0119 11/23/19 0600  GLUCAP 124* 132* 210* 124*   Lipid Profile: No results for input(s): CHOL, HDL, LDLCALC, TRIG, CHOLHDL, LDLDIRECT in the last 72 hours. Thyroid Function Tests: No results for input(s): TSH, T4TOTAL, FREET4, T3FREE, THYROIDAB in the last 72 hours. Anemia Panel: No results for input(s): VITAMINB12, FOLATE, FERRITIN, TIBC, IRON, RETICCTPCT in the last 72 hours.    Radiology Studies: I have reviewed all of the imaging during this hospital visit personally     Scheduled Meds: . Chlorhexidine Gluconate Cloth  6 each Topical Q0600  . Chlorhexidine Gluconate Cloth  6 each Topical Q0600  . dexamethasone (DECADRON) injection  4 mg Intravenous Q6H  . insulin aspart   0-5 Units Subcutaneous QHS  . insulin aspart  0-9 Units Subcutaneous TID WC  . lidocaine-prilocaine  1 application Topical Q M,W,F-HD  . metoprolol succinate  25 mg Oral Daily  . mometasone-formoterol  2 puff Inhalation BID  . pantoprazole  40 mg Oral Daily  . sevelamer carbonate  800 mg Oral TID WC  . sodium chloride flush  3 mL Intravenous Q12H   Continuous Infusions: . sodium chloride    . levETIRAcetam Stopped (11/23/19 6270)     LOS: 2 days        Geni Skorupski Gerome Apley, MD

## 2019-11-23 NOTE — Progress Notes (Signed)
IP PROGRESS NOTE  Subjective:   I saw Mr. Rutigliano last month when he was diagnosed with metastatic renal cell carcinoma. He reports having several episodes of numbness and tingling in the left upper and lower extremities while at a skilled nursing facility and following discharged home.  He presented to the emergency room on 11/21/2019 after he developed "jerking" episodes in the extremities and he was unable to ambulate. An MRI of the brain on 11/21/2019 was consistent with metastatic disease involving multiple brain lesions including left frontal, right parietal, and right cerebellar lesions.  There is associated vasogenic edema. He was placed on Decadron and Keppra.  He was transferred to [facility] yesterday after a prolonged wait for a bed.  Mr. Ransford has no complaint today.  He has not attempted to ambulate.  Objective: Vital signs in last 24 hours: Blood pressure (!) 128/59, pulse 75, temperature 98.1 F (36.7 C), temperature source Oral, resp. rate 18, height 5\' 6"  (1.676 m), weight 187 lb 6.3 oz (85 kg), SpO2 100 %.  Intake/Output from previous day: 07/15 0701 - 07/16 0700 In: 100 [IV Piggyback:100] Out: 1691   Physical Exam:  HEENT: No thrush Lungs: Clear bilaterally Cardiac: Regular rate and rhythm Abdomen: Nontender, no mass, no hepatosplenomegaly Extremities: No leg edema Neurologic: Alert and oriented.  The motor exam appears intact in the upper and lower extremities bilaterally.  I did not test the gait.  Lab Results: Recent Labs    11/21/19 0343 11/21/19 0343 11/21/19 0348 11/22/19 0205  WBC 5.1  --   --  6.2  HGB 9.3*   < > 9.5* 9.2*  HCT 29.2*   < > 28.0* 29.0*  PLT 151  --   --  153   < > = values in this interval not displayed.    BMET Recent Labs    11/22/19 0205 11/23/19 0951  NA 132* 136  K 5.8* 4.2  CL 98 98  CO2 22 26  GLUCOSE 232* 271*  BUN 63* 38*  CREATININE 10.86* 6.52*  CALCIUM 7.4* 7.0*     Medications: I have reviewed the patient's  current medications.   Assessment/plan:  1. Metastatic renal cell carcinoma              Right nephrectomy April 2001, T2N0 lesion ? CT biopsy of a right iliac lesion on 10/02/2019-metastatic carcinoma, cytokeratin 7, racemase, and PAX8 positive ? CT chest 08/28/2019-evolving post radiation changes in the right upper lobe, enlarging bilateral lung nodules ? PET scan 09/18/2019-hypermetabolic right paratracheal node, post radiation changes in the right upper lobe with associated hypermetabolism, mildly hypermetabolic lung nodules, intensely hypermetabolic lytic lesion at the right iliac crest, focal hypermetabolism of the right colon corresponding to an area of diverticular disease ? MRI brain 11/21/2019-at least 3 brain metastases, vasogenic edema 2. Stage I left upper lung cancer 2003, status post a left upper lobectomy 3. Squamous cell carcinoma of the right upper lobe, clinical stage IA2, status post SBRT in 3 fractions April 2020 4. 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 COPD 6. Hypertension 7. End-stage renal disease maintained on hemodialysis since 2015 8. Fall with a fracture of the lateral cortex of the proximal right femur 10/04/2019-nonoperative 9. Admission 11/21/2019 with numbness/tingling in the left upper and lower extremity, inability to ambulate  MRI brain 11/21/2019 confirmed multiple brain metastases, started on Decadron and Keppra  Mr. Clauson has metastatic renal cell carcinoma.  He presented with  left-sided neurologic symptoms, likely partial seizures.  The brain MRI is consistent with metastases.  He has a history of lung cancer.  I suspect the brain metastases are related to renal cell carcinoma.  Mr. Reppert has multiple comorbid conditions including end-stage renal failure on hemodialysis.  He is not a candidate for systemic therapy.  He may be a candidate for palliative radiation.  I will contact  radiation oncology.  I discussed the case with his niece, [name], by telephone.  She understands the poor prognosis.  She agrees to hospice care following the completion of radiation.  He may require nursing facility placement.  Recommendations: 1.  Continue Decadron and Keppra, can switch to oral dosing 2.  Radiation oncology consult-I will call them 3.  Physical therapy evaluation 4.  Please call oncology over the weekend as needed, I will check on him 11/26/2019   LOS: 2 days   Betsy Coder, MD   11/23/2019, 2:29 PM

## 2019-11-23 NOTE — Progress Notes (Signed)
   11/23/19 0934  Clinical Encounter Type  Visited With Patient and family together  Visit Type Initial;Spiritual support  Referral From Physician  Consult/Referral To [place]  This chaplain responded MD consult for Pt. prayer.  The Pt. is sitting up reading the paper as I enter the Pt. room. Lesleigh Noe is bedside.  The Pt. denies any pain this morning and continues to glance at the editorials in today's paper, as he agrees to the visit. A recent episode of "60 Minutes" is on the Pt. mind this morning. The chaplain hears the Pt thoughts on other people's suffering "why", as he connects the program and his faith.  The Pt. 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 The Pt. currently attends the [employer] in his community. The Pt. accepts prayer from the chaplain and a return spiritual care visit.

## 2019-11-23 NOTE — Evaluation (Signed)
Physical Therapy Evaluation Patient Details Name: [name] MRN: [id number] DOB: [birth year] Today's Date: 11/23/2019   History of Present Illness  86 y.o. male with a PMHx of bronchoalveolar carcinoma, renal carcinoma s/p left nephrectomy, ESRD on HD MWF, CIDP, COPD, HTN, recent right greater trochanteric fracture, who presented initially to the [facility] for further assessment of worsening weakness as well as left-sided upper and lower extremity numbness and tingling that began Friday of last week. He states that on Friday, he also had "3 or 4 seizures" that involved jerking of his left side. He was discharged home from his SNF, where he was residing while recovering from the right hip fracture. This admission beginning 7/14, pt believed to have had small seizures secondary to brain mets (L frontal, R parietal, R cerebellar) due to metastatic renal cell carcinoma. Other areas of mets include R iliac, RUL.  Clinical Impression   Pt presents with generalized weakness, impaired standing balance, impaired mobility, and decreased activity tolerance. Pt to benefit from acute PT to address deficits. Pt ambulated room distance with use of RW, with close guard for safety. Pt is a high fall risk, PT recommending SNF level of care, vs home with HHPT and 24/7 although pt lives alone. When PT asked if pt could have 24/7 assist from family, pt states "we'll have to see".  PT to progress mobility as tolerated, and will continue to follow acutely.      Follow Up Recommendations SNF;Home health PT;Supervision/Assistance - 24 hour    Equipment Recommendations  None recommended by PT    Recommendations for Other Services       Precautions / Restrictions Precautions Precautions: Fall Precaution Comments: hx of seizures Restrictions Weight Bearing Restrictions: No      Mobility  Bed Mobility Overal bed mobility: Needs Assistance Bed Mobility: Supine to Sit;Sit to Supine     Supine to sit: Min  guard Sit to supine: Min guard   General bed mobility comments: for safety, use of bedrails to raise and lower self.  Transfers Overall transfer level: Needs assistance Equipment used: Rolling walker (2 wheeled) Transfers: Sit to/from Stand Sit to Stand: Min guard         General transfer comment: for safety, verbal cuing for hand placement when rising/sitting.  Ambulation/Gait Ambulation/Gait assistance: Min guard Gait Distance (Feet): 25 Feet Assistive device: Rolling walker (2 wheeled) Gait Pattern/deviations: Step-to pattern;Decreased step length - right;Decreased weight shift to right;Antalgic;Trunk flexed Gait velocity: decr   General Gait Details: Min guard for safety, verbal cuing for upright posture and proximity to RW. DOE 3/4 which recovers well with rest, VSS.  Stairs            Wheelchair Mobility    Modified Rankin (Stroke Patients Only)       Balance Overall balance assessment: Needs assistance Sitting-balance support: No upper extremity supported;Feet supported Sitting balance-Leahy Scale: Good     Standing balance support: During functional activity Standing balance-Leahy Scale: Fair Standing balance comment: Heavy Reliance on upper extremities.                             Pertinent Vitals/Pain Pain Assessment: No/denies pain    Home Living Family/patient expects to be discharged to:: Private residence Living Arrangements: Alone Available Help at Discharge: Family;Available PRN/intermittently Type of Home: House Home Access: Ramped entrance     Home Layout: One level Home Equipment: Walker - 2 wheels;Cane - single point;Shower seat;Bedside  commode;Wheelchair - manual Additional Comments: Reports decline in function since May 2021 after fall, chronic right knee pain.    Prior Function           Comments: Reports discharging from rehab on Tuesday (7/13) from nursing home. His nephew's wife was set up to assist him at  home. When asked if 24/7 assistance - he states "we were gonna see what I needed." Reports walking with a walker at nursing home and doing good with ADLs though hadn't done much on Tuesday before he had the seizure.     Hand Dominance   Dominant Hand: Right    Extremity/Trunk Assessment   Upper Extremity Assessment Upper Extremity Assessment: Defer to OT evaluation RUE Deficits / Details: grossly functional shoulder ROM, 4/5 strength in shoulder and elbow and wrist (reports wrist sore from fall), 4-/5 grip strength. Digits 3-5 with decreased ROM (unable to fully extend) reports this is chronic for several years. RUE Sensation: WNL RUE Coordination: WNL;decreased fine motor (hx of decreased [place] secondary to ROM deficits in digits 3-5.) LUE Deficits / Details: WFL ROM in LUE, 5/5 strength in shoulder, bicep, tricep, wrist, 4/5 grip strength. Reports he has to concentrate to maintain his strength. LUE Sensation: decreased light touch (Decreased sensation in all of LUE and in scapular area. Reports it feels different or "funny." Does not describe as dull however. Reports numbness and tingling in left hand - reports he has numbness and tingling in hand since dialysis port placed) LUE Coordination: decreased gross motor (Mild ataxia of LUE with finger to nose and finger to finger - with about an inch of dysmetria. Reports he will knock things over if he isn't concentrating.)    Lower Extremity Assessment Lower Extremity Assessment: Generalized weakness (heel to shin WFL bilaterally, symmetrically weak)    Cervical / Trunk Assessment Cervical / Trunk Assessment: Kyphotic  Communication   Communication: No difficulties  Cognition Arousal/Alertness: Awake/alert Behavior During Therapy: WFL for tasks assessed/performed Overall Cognitive Status: Within Functional Limits for tasks assessed                                 General Comments: lacks insight into present deficits and  functional mobility once home      General Comments      Exercises     Assessment/Plan    PT Assessment Patient needs continued PT services  PT Problem List Decreased strength;Decreased mobility;Decreased activity tolerance;Decreased balance;Decreased knowledge of use of DME;Cardiopulmonary status limiting activity;Decreased coordination;Decreased safety awareness       PT Treatment Interventions DME instruction;Therapeutic activities;Gait training;Therapeutic exercise;Patient/family education;Balance training;Functional mobility training;Neuromuscular re-education    PT Goals (Current goals can be found in the Care Plan section)  Acute Rehab PT Goals Patient Stated Goal: To be strong enough to go home PT Goal Formulation: With patient Time For Goal Achievement: 12/07/19 Potential to Achieve Goals: Good    Frequency Min 3X/week   Barriers to discharge        Co-evaluation               AM-PAC PT "6 Clicks" Mobility  Outcome Measure Help needed turning from your back to your side while in a flat bed without using bedrails?: A Little Help needed moving from lying on your back to sitting on the side of a flat bed without using bedrails?: A Lot Help needed moving to and from a bed to a chair (including a  wheelchair)?: A Little Help needed standing up from a chair using your arms (e.g., wheelchair or bedside chair)?: A Little Help needed to walk in hospital room?: A Little Help needed climbing 3-5 steps with a railing? : A Lot 6 Click Score: 16    End of Session Equipment Utilized During Treatment: Gait belt Activity Tolerance: Patient tolerated treatment well;Patient limited by fatigue Patient left: in bed;with call bell/phone within reach;with bed alarm set   PT Visit Diagnosis: Other abnormalities of gait and mobility (R26.89);Muscle weakness (generalized) (M62.81);Difficulty in walking, not elsewhere classified (R26.2)    Time: 7867-5449 PT Time Calculation  (min) (ACUTE ONLY): 19 min   Charges:   PT Evaluation $PT Eval Low Complexity: 1 Low          Carlina Derks E, PT Acute Rehabilitation Services Pager [phone]  Office [phone]  Dallyn Bergland D Elonda Husky 11/23/2019, 5:27 PM

## 2019-11-24 ENCOUNTER — Inpatient Hospital Stay (HOSPITAL_COMMUNITY): Payer: Medicare Other

## 2019-11-24 DIAGNOSIS — M9701XA Periprosthetic fracture around internal prosthetic right hip joint, initial encounter: Secondary | ICD-10-CM

## 2019-11-24 LAB — BASIC METABOLIC PANEL
Anion gap: 15 (ref 5–15)
BUN: 61 mg/dL — ABNORMAL HIGH (ref 8–23)
CO2: 24 mmol/L (ref 22–32)
Calcium: 7.5 mg/dL — ABNORMAL LOW (ref 8.9–10.3)
Chloride: 97 mmol/L — ABNORMAL LOW (ref 98–111)
Creatinine, Ser: 8.29 mg/dL — ABNORMAL HIGH (ref 0.61–1.24)
GFR calc Af Amer: 6 mL/min — ABNORMAL LOW (ref >60)
GFR calc non Af Amer: 5 mL/min — ABNORMAL LOW (ref >60)
Glucose, Bld: 140 mg/dL — ABNORMAL HIGH (ref 70–99)
Potassium: 4.8 mmol/L (ref 3.5–5.1)
Sodium: 136 mmol/L (ref 135–145)

## 2019-11-24 LAB — GLUCOSE, CAPILLARY
Glucose-Capillary: 135 mg/dL — ABNORMAL HIGH (ref 70–99)
Glucose-Capillary: 162 mg/dL — ABNORMAL HIGH (ref 70–99)
Glucose-Capillary: 233 mg/dL — ABNORMAL HIGH (ref 70–99)
Glucose-Capillary: 190 mg/dL — ABNORMAL HIGH (ref 70–99)

## 2019-11-24 LAB — FERRITIN: Ferritin: 1109 ng/mL — ABNORMAL HIGH (ref 24–336)

## 2019-11-24 MED ORDER — DEXAMETHASONE 4 MG PO TABS
4.0000 mg | ORAL_TABLET | Freq: Four times a day (QID) | ORAL | Status: DC
  Administered 2019-11-24 – 2019-11-27 (×11): 4 mg via ORAL
  Filled 2019-11-24 (×11): qty 1

## 2019-11-24 MED ORDER — LEVETIRACETAM 500 MG PO TABS
500.0000 mg | ORAL_TABLET | Freq: Two times a day (BID) | ORAL | Status: DC
  Administered 2019-11-24 – 2019-11-28 (×8): 500 mg via ORAL
  Filled 2019-11-24 (×8): qty 1

## 2019-11-24 NOTE — Progress Notes (Signed)
EEG complete - results pending 

## 2019-11-24 NOTE — Procedures (Signed)
Patient Name: [name]  MRN: [id number]  Epilepsy Attending: Lora Havens  Referring Physician/Provider: Dr Amie Portland Date: 11/24/2019 Duration: 25.52 mins  Patient history: 86 year old male with a history of renal and lung cancer, presenting with seizure like activity. MRI brain reveals bilateral cerebral metastases. EEG to evaluate for seizure  Level of alertness: Awake, asleep  AEDs during EEG study: LEV  Technical aspects: This EEG study was done with scalp electrodes positioned according to the 10-20 International system of electrode placement. Electrical activity was acquired at a sampling rate of 500Hz  and reviewed with a high frequency filter of 70Hz  and a low frequency filter of 1Hz . EEG data were recorded continuously and digitally stored.   Description: The posterior dominant rhythm consists of 9 Hz activity of moderate voltage (25-35 uV) seen predominantly in posterior head regions, symmetric and reactive to eye opening and eye closing. Sleep was characterized by vertex waves, sleep spindles (12 to 14 Hz), maximal frontocentral region. Hyperventilation and photic stimulation were not performed.     IMPRESSION: This study is within normal limits. No seizures or epileptiform discharges were seen throughout the recording.   [name] Barbra Sarks

## 2019-11-24 NOTE — Procedures (Signed)
I was present at this dialysis session. I have reviewed the session itself and made appropriate changes.   2K. 2L UF. Using AVF. Pt w/o complaint.    Filed Weights   11/21/19 0318 11/22/19 2054  Weight: 81.6 kg 85 kg    Recent Labs  Lab 11/24/19 0628  NA 136  K 4.8  CL 97*  CO2 24  GLUCOSE 140*  BUN 61*  CREATININE 8.29*  CALCIUM 7.5*    Recent Labs  Lab 11/21/19 0343 11/21/19 0348 11/22/19 0205  WBC 5.1  --  6.2  NEUTROABS 3.7  --   --   HGB 9.3* 9.5* 9.2*  HCT 29.2* 28.0* 29.0*  MCV 101.0*  --  101.0*  PLT 151  --  153    Scheduled Meds: . Chlorhexidine Gluconate Cloth  6 each Topical Q0600  . Chlorhexidine Gluconate Cloth  6 each Topical Q0600  . dexamethasone (DECADRON) injection  4 mg Intravenous Q6H  . insulin aspart  0-5 Units Subcutaneous QHS  . insulin aspart  0-9 Units Subcutaneous TID WC  . lidocaine-prilocaine  1 application Topical Q M,W,F-HD  . metoprolol succinate  25 mg Oral Daily  . mometasone-formoterol  2 puff Inhalation BID  . pantoprazole  40 mg Oral Daily  . sevelamer carbonate  800 mg Oral TID WC  . sodium chloride flush  3 mL Intravenous Q12H   Continuous Infusions: . sodium chloride    . levETIRAcetam 500 mg (11/24/19 0532)   PRN Meds:.sodium chloride, acetaminophen **OR** acetaminophen, albuterol, ipratropium-albuterol, LORazepam, ondansetron **OR** ondansetron (ZOFRAN) IV, oxyCODONE, sodium chloride flush   Pearson Grippe  MD 11/24/2019, 8:25 AM

## 2019-11-24 NOTE — Progress Notes (Addendum)
Patient is not available, not in room will attempt later for EEG. per nurse the pt left room for dialysis at 745am.

## 2019-11-24 NOTE — Progress Notes (Signed)
Physical Therapy Treatment Patient Details Name: [name] MRN: [id number] DOB: 1930/05/26 Today's Date: 11/24/2019    History of Present Illness 86 y.o. male with a PMHx of bronchoalveolar carcinoma, renal carcinoma s/p left nephrectomy, ESRD on HD MWF, CIDP, COPD, HTN, recent right greater trochanteric fracture, who presented initially to the [facility] ED for further assessment of worsening weakness as well as left-sided upper and lower extremity numbness and tingling that began Friday of last week. He states that on Friday, he also had "3 or 4 seizures" that involved jerking of his left side. He was discharged home from his SNF, where he was residing while recovering from the right hip fracture. This admission beginning 7/14, pt believed to have had small seizures secondary to brain mets (L frontal, R parietal, R cerebellar) due to metastatic renal cell carcinoma. Other areas of mets include R iliac, RUL.    PT Comments    Pt in bed upon arrival of PT, agreeable to session with focus on progression of OOB mobility and determination of d/c plan. The pt continues to present with limitations in functional strength, endurance, and stability, but is able to complete bed mobility and transfers with minG for safety. The pt also completed short ambulation in his room with minG and use of RW, but reports significant fatigue and SOB following. The pt will continue to benefit from skilled PT to further progress functional strength and stability for transfers and endurance to facilitate ambulation in the home. I educated the niece on importance of 24/7 supervision for safety if the pt is going home and she verbalized agreement.    Follow Up Recommendations  Home health PT;Supervision/Assistance - 24 hour     Equipment Recommendations  None recommended by PT    Recommendations for Other Services       Precautions / Restrictions Precautions Precautions: Fall Precaution Comments: hx of  seizures Restrictions Weight Bearing Restrictions: No    Mobility  Bed Mobility Overal bed mobility: Needs Assistance Bed Mobility: Supine to Sit     Supine to sit: Supervision;HOB elevated     General bed mobility comments: supervision for safety, pt with use fo bed rails and extra time, reaching for minA but able to complete without assist  Transfers Overall transfer level: Needs assistance Equipment used: Rolling walker (2 wheeled) Transfers: Sit to/from Stand Sit to Stand: Min guard         General transfer comment: for safety, verbal cuing for hand placement when rising/sitting. 5x sts from recliner in 30 sec with VC for hand positioning each time  Ambulation/Gait Ambulation/Gait assistance: Min guard Gait Distance (Feet): 25 Feet Assistive device: Rolling walker (2 wheeled) Gait Pattern/deviations: Step-to pattern;Decreased step length - right;Decreased weight shift to right;Antalgic;Trunk flexed Gait velocity: decr Gait velocity interpretation: <1.31 ft/sec, indicative of household ambulator General Gait Details: Min guard for safety, verbal cuing for upright posture and proximity to RW. DOE 3/4 which recovers well with rest, VSS.      Balance Overall balance assessment: Needs assistance Sitting-balance support: No upper extremity supported;Feet supported Sitting balance-Leahy Scale: Good     Standing balance support: During functional activity Standing balance-Leahy Scale: Fair Standing balance comment: Heavy Reliance on upper extremities.                            Cognition Arousal/Alertness: Awake/alert Behavior During Therapy: WFL for tasks assessed/performed Overall Cognitive Status: Within Functional Limits for tasks assessed  Exercises General Exercises - Lower Extremity Long Arc Quad: AROM;Both;10 reps;Seated (against manual resistance) Heel Slides: AROM;Seated;Both;10 reps  (against manual resistance) Hip Flexion/Marching: AROM;Both;10 reps;Seated Heel Raises: AROM;Both;10 reps;Seated    General Comments General comments (skin integrity, edema, etc.): Pt neice present and discussed importance of 24/7 supervision at home for safety.      Pertinent Vitals/Pain Pain Assessment: No/denies pain           PT Goals (current goals can now be found in the care plan section) Acute Rehab PT Goals Patient Stated Goal: To be strong enough to go home PT Goal Formulation: With patient Time For Goal Achievement: 12/07/19 Potential to Achieve Goals: Good Progress towards PT goals: Progressing toward goals    Frequency    Min 3X/week      PT Plan Current plan remains appropriate       AM-PAC PT "6 Clicks" Mobility   Outcome Measure  Help needed turning from your back to your side while in a flat bed without using bedrails?: A Little Help needed moving from lying on your back to sitting on the side of a flat bed without using bedrails?: A Little Help needed moving to and from a bed to a chair (including a wheelchair)?: A Little Help needed standing up from a chair using your arms (e.g., wheelchair or bedside chair)?: A Little Help needed to walk in hospital room?: A Little Help needed climbing 3-5 steps with a railing? : A Little 6 Click Score: 18    End of Session Equipment Utilized During Treatment: Gait belt Activity Tolerance: Patient tolerated treatment well;Patient limited by fatigue Patient left: in chair;with call bell/phone within reach;with chair alarm set;with family/visitor present Nurse Communication: Mobility status PT Visit Diagnosis: Other abnormalities of gait and mobility (R26.89);Muscle weakness (generalized) (M62.81);Difficulty in walking, not elsewhere classified (R26.2)     Time: 4158-3094 PT Time Calculation (min) (ACUTE ONLY): 28 min  Charges:  $Gait Training: 8-22 mins $Therapeutic Exercise: 8-22 mins                      Karma Ganja, PT, DPT   Acute Rehabilitation Department Pager #: [phone]    Otho Bellows 11/24/2019, 5:39 PM

## 2019-11-24 NOTE — Progress Notes (Signed)
   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 Plans for transfer to [facility] for neurology and oncology evaluation. Follows with Dr. Benay Spice.  Palliative care was asked to get involved to aid in goals of care conversations in the setting of metastatic disease.  Today's Discussion (11/24/2019): Chart reviewed. I met with Gillian and his niece, [name] at bedside. Firman was being seen by therapy at this time. He is sitting upright in the shart in NAD.   [name] shares that she was able to speak to oncology and the patients primary doctor. She expressed that the patient still has quality in his life and they have all jointly decided that pursuing radiation is the next best step. I offered support throughout our time together.   [name] gave me a copy of HCPOA of document for filing.  Discussed the importance of continued conversation with family and their  medical providers regarding overall plan of care and treatment options, ensuring decisions are within the context of the patients values and GOCs.  Provided"Hard Choices for [employer]" booklet.   Questions and concerns addressed   SUMMARY OF RECOMMENDATIONS   DNAR/DNI  MOST Completed, paper copy placed onto the chart electric copy can be found in the media section of epic  DNR Form Completed, paper copy placed onto the chart electric copy can be found  in the media section of epic  HCPOA document, paper copy placed onto the chart electric copy can be found in the media section of epic   Patient is in agreement with Palliative radiation  Patient being follow by [facility]  Time Spent: 25 Greater than 50% of the time was spent in counseling and coordination of care ______________________________________________________________________________________ [place] Team Team Cell Phone: [phone] Please utilize secure chat with additional questions, if there is no response within 30 minutes please call the above phone number  Palliative Medicine Team providers are available by phone from 7am to 7pm daily and can be reached through the team cell phone.  Should this patient require assistance outside of these hours, please call the patient's attending physician.

## 2019-11-24 NOTE — Progress Notes (Signed)
PROGRESS NOTE    CARNELIUS HAMMITT  DSK:[id number] DOB: 05-06-1931 DOA: 11/21/2019 PCP: Sharilyn Sites, MD    Brief Narrative:  Patient admitted to the hospital with the working diagnosis of new metastatic brain lesions with vosegenic edema, in the setting of bronchoalveolar carcinoma. (transferred from [facility] to [facility] on 11/22/19)  86 year old male with past medical history for end-stage renal disease on hemodialysis, COPD, bronchoalveolar carcinoma, hypertension, renal cell carcinoma status post left nephrectomy, GERD, and recent right greater trochanteric fracture.  Patient reported about 5 days of left sided weakness and paresthesias.  Patient had progressive symptoms to the point where he was having difficulty ambulating.  On the day of admission he was noted to have jerking movements, generalized, but self resolved after a few minutes.  No loss of consciousness.  On his initial physical examination blood pressure 143/65, heart rate 73, respiratory 24-26, oxygen saturation 98%.  His lungs are clear to auscultation bilaterally, heart S1-S2, present rhythmic, soft abdomen, no lower extremity edema.  He had decreased strength left upper extremity. Sodium 136, potassium 4.7, chloride 98, bicarb 24, glucose 123, BUN 48, creatinine 9.22, AST 12, ALT 13, white count 5.1, hemoglobin 9.3, hematocrit 39.2, platelets 151.  SARS COVID-19 negative.   Head CT with bilateral cerebral cytotoxic edema.   Brain MRI with metastatic disease of the brain with a left frontal and right parietal lesions measuring up to 11 mm associated with moderate to extensive vasogenic edema.  Possible third lesion in the superior right cerebellum, 4 mm. EKG 73 bpm, left axis deviation, left anterior fascicular block, first-degree AV block, normal QTC, sinus rhythm, no ST segment or T wave changes.  Patient has been placed on Keppra for seizure control. No further seizures. Neurosurgery recommended radiation therapy, no indication for  surgical intervention.   Oncology has been consulted along with nephrology and palliative care.    Assessment & Plan:   Principal Problem:   Metastasis to brain [facility]) Active Problems:   End stage renal disease (HCC)   COPD (chronic obstructive pulmonary disease) (HCC)   Hypertension   GERD (gastroesophageal reflux disease)   Periprosthetic fracture around internal prosthetic right hip joint ([place]) 10/04/2019   Seizure ([place])   Bronchoalveolar carcinoma ([place])   Renal cell carcinoma ([place])   Palliative care by specialist   Goals of care, counseling/discussion   DNR (do not resuscitate)   1. New onset brain metastatic lesions, complicated vasogenic brain edema and seizures. No further seizures overnight. Patient has been working with physical therapy, no nausea or vomiting, no headaches.   Continue with  Keppra and dexamethasone, will change to oral dosing. Follow up with further neurology recommendations. Pending EEG report.   Follow up with radiation therapy recommendations.   2. ESRD on HD. hyperkalemia. On sevelamer. Patient had HD today with no major complications.   Continue to follow nephrology recommendations for maintenance dialysis.   3. HTN. Continue blood pressure control with metoprolol.   4. Bronchoalveolar carcinoma/ metastatic renal cell carcinoma. Patient follows with Dr. Benay Spice.  Old records personally reviewed, last visit 06/08. Diagnoses with metastatic renal cell carcinoma, bilateral lung nodules and hypermetabolic mediastinal node. Plan was to hold on chemotherapy and continue close observation with repeat CT in 3 to 4 months interval.   Pending radiation oncology recommendations.   4. Right greater trochanter hip fracture. Old records personally reviewed, orthopedic follow up on -7/06, healing lesion well.   Continue out of bed to chair tid with meals, continue physical and occupation  therapy.   5. COPD. No current signs of exacerbation, on  bronchodilator therapy and  dulera.   6. T2DM. On insulin sliding scale for glucose cover and monitoring. Patient is tolerating po well. Fating glucose is 140.     Status is: Inpatient  Remains inpatient appropriate because:Inpatient level of care appropriate due to severity of illness   Dispo: The patient is from: Home              Anticipated d/c is to: Home              Anticipated d/c date is: 3 days              Patient currently is not medically stable to d/c.   DVT prophylaxis: Enoxaparin   Code Status:   dnr   Family Communication:  .mafc     Consultants:   Neurology   Neurosurgery   Oncology   Palliative      Subjective: Patient with no further seizure, has been out of bed with a walker with help of physical therapy, positive dyspnea on exertion, no nausea or vomiting, no headache.   Objective: Vitals:   11/24/19 1030 11/24/19 1100 11/24/19 1130 11/24/19 1143  BP: (!) 114/56 (!) 114/51 (!) 120/52 (!) 125/51  Pulse: (!) 52 (!) 55 (!) 57 (!) 52  Resp: 18 18 18 18   Temp:    98.5 F (36.9 C)  TempSrc:    Oral  SpO2:    99%  Weight:    80.5 kg  Height:        Intake/Output Summary (Last 24 hours) at 11/24/2019 1402 Last data filed at 11/24/2019 1143 Gross per 24 hour  Intake --  Output 2000 ml  Net -2000 ml   Filed Weights   11/22/19 2054 11/24/19 0754 11/24/19 1143  Weight: 85 kg 83 kg 80.5 kg    Examination:   General: Not in pain or dyspnea, deconditioned  Neurology: Awake and alert, non focal  E ENT: mild pallor, no icterus, oral mucosa moist Cardiovascular: No JVD. S1-S2 present, rhythmic, no gallops, rubs, or murmurs. No lower extremity edema. Pulmonary: positive breath sounds bilaterally, adequate air movement, no wheezing, rhonchi or rales. Gastrointestinal. Abdomen with, no organomegaly, non tender, no rebound or guarding Skin. No rashes Musculoskeletal: no joint deformities     Data Reviewed: I have personally reviewed  following labs and imaging studies  CBC: Recent Labs  Lab 11/21/19 0343 11/21/19 0348 11/22/19 0205  WBC 5.1  --  6.2  NEUTROABS 3.7  --   --   HGB 9.3* 9.5* 9.2*  HCT 29.2* 28.0* 29.0*  MCV 101.0*  --  101.0*  PLT 151  --  335   Basic Metabolic Panel: Recent Labs  Lab 11/21/19 0343 11/21/19 0348 11/22/19 0205 11/23/19 0951 11/24/19 0628  NA 136 137 132* 136 136  K 4.7 4.6 5.8* 4.2 4.8  CL 98 97* 98 98 97*  CO2 24  --  22 26 24   GLUCOSE 123* 121* 232* 271* 140*  BUN 48* 42* 63* 38* 61*  CREATININE 9.22* 9.90* 10.86* 6.52* 8.29*  CALCIUM 7.5*  --  7.4* 7.0* 7.5*  MG  --   --  2.0  --   --    GFR: Estimated Creatinine Clearance: 6.3 mL/min (A) (by C-G formula based on SCr of 8.29 mg/dL (H)). Liver Function Tests: Recent Labs  Lab 11/21/19 0343  AST 12*  ALT 13  ALKPHOS 150*  BILITOT 0.6  PROT 6.4*  ALBUMIN 3.1*   No results for input(s): LIPASE, AMYLASE in the last 168 hours. No results for input(s): AMMONIA in the last 168 hours. Coagulation Profile: Recent Labs  Lab 11/21/19 0343  INR 1.0   Cardiac Enzymes: No results for input(s): CKTOTAL, CKMB, CKMBINDEX, TROPONINI in the last 168 hours. BNP (last 3 results) No results for input(s): PROBNP in the last 8760 hours. HbA1C: Recent Labs    11/22/19 0205  HGBA1C 5.9*   CBG: Recent Labs  Lab 11/23/19 1217 11/23/19 1640 11/23/19 2009 11/24/19 0614 11/24/19 1337  GLUCAP 175* 254* 183* 135* 162*   Lipid Profile: No results for input(s): CHOL, HDL, LDLCALC, TRIG, CHOLHDL, LDLDIRECT in the last 72 hours. Thyroid Function Tests: No results for input(s): TSH, T4TOTAL, FREET4, T3FREE, THYROIDAB in the last 72 hours. Anemia Panel: Recent Labs    11/24/19 0809  FERRITIN 1,109*      Radiology Studies: I have reviewed all of the imaging during this hospital visit personally     Scheduled Meds: . Chlorhexidine Gluconate Cloth  6 each Topical Q0600  . Chlorhexidine Gluconate Cloth  6 each  Topical Q0600  . dexamethasone (DECADRON) injection  4 mg Intravenous Q6H  . insulin aspart  0-5 Units Subcutaneous QHS  . insulin aspart  0-9 Units Subcutaneous TID WC  . lidocaine-prilocaine  1 application Topical Q M,W,F-HD  . metoprolol succinate  25 mg Oral Daily  . mometasone-formoterol  2 puff Inhalation BID  . pantoprazole  40 mg Oral Daily  . sevelamer carbonate  800 mg Oral TID WC  . sodium chloride flush  3 mL Intravenous Q12H   Continuous Infusions: . sodium chloride Stopped (11/24/19 1336)  . levETIRAcetam 500 mg (11/24/19 0532)     LOS: 3 days        Rande Dario Gerome Apley, MD

## 2019-11-25 DIAGNOSIS — C649 Malignant neoplasm of unspecified kidney, except renal pelvis: Secondary | ICD-10-CM

## 2019-11-25 LAB — GLUCOSE, CAPILLARY
Glucose-Capillary: 147 mg/dL — ABNORMAL HIGH (ref 70–99)
Glucose-Capillary: 185 mg/dL — ABNORMAL HIGH (ref 70–99)
Glucose-Capillary: 284 mg/dL — ABNORMAL HIGH (ref 70–99)
Glucose-Capillary: 174 mg/dL — ABNORMAL HIGH (ref 70–99)
Glucose-Capillary: 207 mg/dL — ABNORMAL HIGH (ref 70–99)

## 2019-11-25 NOTE — Progress Notes (Signed)
PROGRESS NOTE    KOLLEN ARMENTI  ACZ:[id number] DOB: 1931/03/01 DOA: 11/21/2019 PCP: Sharilyn Sites, MD    Brief Narrative:  Patient admitted to the hospital with the working diagnosis of new metastatic brain lesions with vosegenic edema, in the setting of bronchoalveolar carcinoma. (transferred from [facility] to [facility] on 11/22/19)  86 year old male with past medical history for end-stage renal disease on hemodialysis, COPD, bronchoalveolar carcinoma, hypertension, renal cell carcinoma status post left nephrectomy, GERD, and recent right greater trochanteric fracture. Patient reported about 5 days of left sided weakness and paresthesias. Patient had progressive symptoms to the point where he was having difficulty ambulating. On the day of admission he was noted to have jerking movements, generalized,but self resolved after a few minutes. No loss of consciousness. On his initial physical examination blood pressure 143/65, heart rate 73, respiratory 24-26, oxygen saturation 98%. His lungs are clear to auscultation bilaterally, heart S1-S2, present rhythmic, soft abdomen, no lower extremity edema. He had decreased strength left upper extremity. Sodium 136, potassium 4.7, chloride 98, bicarb 24, glucose 123, BUN 48, creatinine 9.22, AST 12, ALT 13, white count 5.1, hemoglobin 9.3, hematocrit 39.2, platelets 151. SARS COVID-19 negative.  Head CT with bilateral cerebral cytotoxic edema. Brain MRI with metastatic disease of the brain with a left frontal and right parietal lesions measuring up to 11 mm associated with moderate to extensive vasogenic edema. Possible third lesion in the superior right cerebellum, 4 mm. EKG 73 bpm, left axis deviation, left anterior fascicular block, first-degree AV block, normal QTC, sinus rhythm, no ST segment or T wave changes.  Patient has been placed on Keppra for seizure control. No further seizures. Neurosurgery recommended radiation therapy, no indication for  surgical intervention.   Oncology has been consulted along with nephrology and palliative care.   Assessment & Plan:   Principal Problem:   Metastasis to brain [facility]) Active Problems:   End stage renal disease (HCC)   COPD (chronic obstructive pulmonary disease) (HCC)   Hypertension   GERD (gastroesophageal reflux disease)   Periprosthetic fracture around internal prosthetic right hip joint ([place]) 10/04/2019   Seizure ([place])   Bronchoalveolar carcinoma ([place])   Renal cell carcinoma ([place])   Palliative care by specialist   Goals of care, counseling/discussion   DNR (do not resuscitate)   1. New onset brain metastatic lesions, complicated vasogenic brain edema and seizures. EEG with no active seizures.   Tolerating well po  Keppra and dexamethasone. Pending radiation oncology recommendations. Physical therapy has recommended home health services at discharge.   2. ESRD on HD. hyperkalemia. Continue with sevelamer. Hemodialysis per nephrology recommendations. Clinically euvolemic.   3. HTN. On metoprolol for blood pressure control.   4. Bronchoalveolar carcinoma/ metastatic renal cell carcinoma. Patient follows with Dr. Benay Spice.  Old records personally reviewed, last visit 06/08. Diagnoses with metastatic renal cell carcinoma, bilateral lung nodules and hypermetabolic mediastinal node. Plan was to hold on chemotherapy and continue close observation with repeat CT in 3 to 4 months interval.   Radiation oncology to see patient during this hospitalization.   4. Right greater trochanter hip fracture. Old records personally reviewed, orthopedic follow up on -7/06, healing lesion well.   Patient will continue physical therapy at home.   5. COPD. No signs of acute exacerbation, continue with bronchodilator therapy and  dulera.   6. T2DM. fasting glucose this am 140. Will continue with insulin sliding scale for glucose cover and monitoring.     Status is: Inpatient  Remains  inpatient  appropriate because:Inpatient level of care appropriate due to severity of illness   Dispo: The patient is from: Home              Anticipated d/c is to: Home              Anticipated d/c date is: 1 day              Patient currently is not medically stable to d/c.   DVT prophylaxis: Enoxaparin   Code Status:   dnr   Family Communication:  I spoke with patient's nice  at the bedside, we talked in detail about patient's condition, plan of care and prognosis and all questions were addressed.     Consultants:   Neurology   Neurosurgery   Oncology   Palliative     Subjective: Patient with no nausea or vomiting, no headache or further seizures. He is willing to go home after discharge.   Objective: Vitals:   11/24/19 1557 11/24/19 2027 11/25/19 0740 11/25/19 0824  BP: (!) 135/57 (!) 142/58  (!) 152/56  Pulse: 64 60  63  Resp: 16 18  20   Temp: 97.8 F (36.6 C) 97.8 F (36.6 C)  98.2 F (36.8 C)  TempSrc: Oral Oral  Oral  SpO2: 100% 100% 98% 100%  Weight:      Height:       No intake or output data in the 24 hours ending 11/25/19 1227 Filed Weights   11/22/19 2054 11/24/19 0754 11/24/19 1143  Weight: 85 kg 83 kg 80.5 kg    Examination:   General: Not in pain or dyspnea. Deconditioned  Neurology: Awake and alert, non focal  E ENT: no pallor, no icterus, oral mucosa moist Cardiovascular: No JVD. S1-S2 present, rhythmic, no gallops, rubs, or murmurs. No lower extremity edema. Pulmonary: positive breath sounds bilaterally, adequate air movement, no wheezing, rhonchi or rales. Gastrointestinal. Abdomen sift with no organomegaly, non tender, no rebound or guarding Skin. No rashes Musculoskeletal: no joint deformities     Data Reviewed: I have personally reviewed following labs and imaging studies  CBC: Recent Labs  Lab 11/21/19 0343 11/21/19 0348 11/22/19 0205  WBC 5.1  --  6.2  NEUTROABS 3.7  --   --   HGB 9.3* 9.5* 9.2*  HCT 29.2* 28.0*  29.0*  MCV 101.0*  --  101.0*  PLT 151  --  703   Basic Metabolic Panel: Recent Labs  Lab 11/21/19 0343 11/21/19 0348 11/22/19 0205 11/23/19 0951 11/24/19 0628  NA 136 137 132* 136 136  K 4.7 4.6 5.8* 4.2 4.8  CL 98 97* 98 98 97*  CO2 24  --  22 26 24   GLUCOSE 123* 121* 232* 271* 140*  BUN 48* 42* 63* 38* 61*  CREATININE 9.22* 9.90* 10.86* 6.52* 8.29*  CALCIUM 7.5*  --  7.4* 7.0* 7.5*  MG  --   --  2.0  --   --    GFR: Estimated Creatinine Clearance: 6.3 mL/min (A) (by C-G formula based on SCr of 8.29 mg/dL (H)). Liver Function Tests: Recent Labs  Lab 11/21/19 0343  AST 12*  ALT 13  ALKPHOS 150*  BILITOT 0.6  PROT 6.4*  ALBUMIN 3.1*   No results for input(s): LIPASE, AMYLASE in the last 168 hours. No results for input(s): AMMONIA in the last 168 hours. Coagulation Profile: Recent Labs  Lab 11/21/19 0343  INR 1.0   Cardiac Enzymes: No results for input(s): CKTOTAL, CKMB, CKMBINDEX, TROPONINI in the  last 168 hours. BNP (last 3 results) No results for input(s): PROBNP in the last 8760 hours. HbA1C: No results for input(s): HGBA1C in the last 72 hours. CBG: Recent Labs  Lab 11/24/19 1641 11/24/19 2104 11/25/19 0608 11/25/19 0827 11/25/19 1134  GLUCAP 233* 190* 147* 185* 284*   Lipid Profile: No results for input(s): CHOL, HDL, LDLCALC, TRIG, CHOLHDL, LDLDIRECT in the last 72 hours. Thyroid Function Tests: No results for input(s): TSH, T4TOTAL, FREET4, T3FREE, THYROIDAB in the last 72 hours. Anemia Panel: Recent Labs    11/24/19 0809  FERRITIN 1,109*      Radiology Studies: I have reviewed all of the imaging during this hospital visit personally     Scheduled Meds: . Chlorhexidine Gluconate Cloth  6 each Topical Q0600  . Chlorhexidine Gluconate Cloth  6 each Topical Q0600  . dexamethasone  4 mg Oral Q6H  . insulin aspart  0-5 Units Subcutaneous QHS  . insulin aspart  0-9 Units Subcutaneous TID WC  . levETIRAcetam  500 mg Oral BID  .  lidocaine-prilocaine  1 application Topical Q M,W,F-HD  . metoprolol succinate  25 mg Oral Daily  . mometasone-formoterol  2 puff Inhalation BID  . pantoprazole  40 mg Oral Daily  . sevelamer carbonate  800 mg Oral TID WC  . sodium chloride flush  3 mL Intravenous Q12H   Continuous Infusions: . sodium chloride Stopped (11/24/19 1336)     LOS: 4 days        Candy Ziegler Gerome Apley, MD

## 2019-11-25 NOTE — Plan of Care (Signed)
EEG: no sezures. No epileptiform discharges.    Recs; Patient oncology and neurology follow-up Continue dexamethasone and Keppra Maintain seizure precautions Outpatient referral for Dr. Merrilyn Puma, neuro oncologist  -- Amie Portland, MD [facility] Pager: [phone] If 7pm to 7am, please call on call as listed on AMION.

## 2019-11-25 NOTE — Progress Notes (Signed)
Admit: 11/21/2019 LOS: 4  79M ESRD MWF [facility] with Seizures and new brain mets, hx/o RCC   Subjective:  . HD yesterday,uneventful 2L UF . Working with palliative care . Awaitng Rad Onc consult . No c/o this AM   07/17 0701 - 07/18 0700 In: -  Out: 2000   Filed Weights   11/22/19 2054 11/24/19 0754 11/24/19 1143  Weight: 85 kg 83 kg 80.5 kg    Scheduled Meds: . Chlorhexidine Gluconate Cloth  6 each Topical Q0600  . Chlorhexidine Gluconate Cloth  6 each Topical Q0600  . dexamethasone  4 mg Oral Q6H  . insulin aspart  0-5 Units Subcutaneous QHS  . insulin aspart  0-9 Units Subcutaneous TID WC  . levETIRAcetam  500 mg Oral BID  . lidocaine-prilocaine  1 application Topical Q M,W,F-HD  . metoprolol succinate  25 mg Oral Daily  . mometasone-formoterol  2 puff Inhalation BID  . pantoprazole  40 mg Oral Daily  . sevelamer carbonate  800 mg Oral TID WC  . sodium chloride flush  3 mL Intravenous Q12H   Continuous Infusions: . sodium chloride Stopped (11/24/19 1336)   PRN Meds:.sodium chloride, acetaminophen **OR** acetaminophen, albuterol, ipratropium-albuterol, LORazepam, ondansetron **OR** ondansetron (ZOFRAN) IV, oxyCODONE, sodium chloride flush  Current Labs: reviewed    Physical Exam:  Blood pressure (!) 152/56, pulse 63, temperature 98.2 F (36.8 C), temperature source Oral, resp. rate 20, height 5\' 6"  (1.676 m), weight 80.5 kg, SpO2 100 %. NAD, conversant RRR  CTAB LUE AVF +B/T, bandaged No sig [name]  A 1. ESRD MWF [facility]  LUE AVF 2. Metastatic RCC, new brain metastases, per neuro/oncology; possibly for palliative XRT 3. Seizures, neuro; Keppra 4. CKD-BMD, cont binders 5. Anemia, hold ESA, Hb 9s 6. Hyperkalemia, resolved  P . HD tomorrow on schedule: 3.5h, 2K, 2-3L UF, No heparin, AVF . Medication Issues; o Preferred narcotic agents for pain control are hydromorphone, fentanyl, and methadone. Morphine should not be used.  o Baclofen should  be avoided o Avoid oral sodium phosphate and magnesium citrate based laxatives / bowel preps    Pearson Grippe MD 11/25/2019, 10:43 AM  Recent Labs  Lab 11/22/19 0205 11/23/19 0951 11/24/19 0628  NA 132* 136 136  K 5.8* 4.2 4.8  CL 98 98 97*  CO2 22 26 24   GLUCOSE 232* 271* 140*  BUN 63* 38* 61*  CREATININE 10.86* 6.52* 8.29*  CALCIUM 7.4* 7.0* 7.5*   Recent Labs  Lab 11/21/19 0343 11/21/19 0348 11/22/19 0205  WBC 5.1  --  6.2  NEUTROABS 3.7  --   --   HGB 9.3* 9.5* 9.2*  HCT 29.2* 28.0* 29.0*  MCV 101.0*  --  101.0*  PLT 151  --  153

## 2019-11-25 NOTE — Progress Notes (Signed)
   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 Plans for transfer to [facility] for neurology and oncology evaluation. Follows with Dr. Benay Spice.  Palliative care was asked to get involved to aid in goals of care conversations in the setting of metastatic disease.  Today's Discussion (11/25/2019): Chart reviewed. I met with [name] at bedside. He is upright enjoying his breakfast. We talked about his present cancer diagnosis.   [name] asked if radiation is the right choice. I shared with him that this is truly his decision though is may help alleviate some of his symptoms. He stated that he thinks its a good decision to pursue it. I shared again that at any point if he feels it's more burdensome than beneficial to discuss it with the medical team and they can help formulate a plane moving forward.   I shared that he will have someone from OP Palliative care following up with him upon discharge.   Discussed the importance of continued conversation with family and their  medical providers regarding overall plan of care and treatment options, ensuring decisions are within the context of the patients values and GOCs.  Questions and concerns addressed   SUMMARY OF RECOMMENDATIONS   DNAR/DNI  MOST Completed, paper copy placed onto the chart electric copy can be found in the media section of epic  DNR Form Completed,  paper copy placed onto the chart electric copy can be found in the media section of epic  HCPOA document, paper copy placed onto the chart electric copy can be found in the media section of epic   Plan for radiation oncology --> Palliative radiation  Patient being follow by [facility]  Time Spent: 25 Greater than 50% of the time was spent in counseling and coordination of care ______________________________________________________________________________________ [place] Team Team Cell Phone: [phone] Please utilize secure chat with additional questions, if there is no response within 30 minutes please call the above phone number  Palliative Medicine Team providers are available by phone from 7am to 7pm daily and can be reached through the team cell phone.  Should this patient require assistance outside of these hours, please call the patient's attending physician.

## 2019-11-26 ENCOUNTER — Ambulatory Visit
Admit: 2019-11-26 | Discharge: 2019-11-26 | Disposition: A | Discharge status: Home / Self Care | Payer: Medicare Other | Attending: Radiation Oncology | Admitting: Radiation Oncology

## 2019-11-26 DIAGNOSIS — C7931 Secondary malignant neoplasm of brain: Secondary | ICD-10-CM

## 2019-11-26 DIAGNOSIS — C642 Malignant neoplasm of left kidney, except renal pelvis: Secondary | ICD-10-CM

## 2019-11-26 DIAGNOSIS — I10 Essential (primary) hypertension: Secondary | ICD-10-CM

## 2019-11-26 DIAGNOSIS — C3411 Malignant neoplasm of upper lobe, right bronchus or lung: Secondary | ICD-10-CM

## 2019-11-26 LAB — RENAL FUNCTION PANEL
Albumin: 2.6 g/dL — ABNORMAL LOW (ref 3.5–5.0)
Anion gap: 12 (ref 5–15)
BUN: 48 mg/dL — ABNORMAL HIGH (ref 8–23)
CO2: 24 mmol/L (ref 22–32)
Calcium: 7.9 mg/dL — ABNORMAL LOW (ref 8.9–10.3)
Chloride: 99 mmol/L (ref 98–111)
Creatinine, Ser: 5.44 mg/dL — ABNORMAL HIGH (ref 0.61–1.24)
GFR calc Af Amer: 10 mL/min — ABNORMAL LOW (ref >60)
GFR calc non Af Amer: 9 mL/min — ABNORMAL LOW (ref >60)
Glucose, Bld: 132 mg/dL — ABNORMAL HIGH (ref 70–99)
Phosphorus: 3.1 mg/dL (ref 2.5–4.6)
Potassium: 3.8 mmol/L (ref 3.5–5.1)
Sodium: 135 mmol/L (ref 135–145)

## 2019-11-26 LAB — GLUCOSE, CAPILLARY
Glucose-Capillary: 142 mg/dL — ABNORMAL HIGH (ref 70–99)
Glucose-Capillary: 157 mg/dL — ABNORMAL HIGH (ref 70–99)
Glucose-Capillary: 143 mg/dL — ABNORMAL HIGH (ref 70–99)
Glucose-Capillary: 242 mg/dL — ABNORMAL HIGH (ref 70–99)
Glucose-Capillary: 228 mg/dL — ABNORMAL HIGH (ref 70–99)
Glucose-Capillary: 234 mg/dL — ABNORMAL HIGH (ref 70–99)

## 2019-11-26 LAB — CBC
HCT: 31.6 % — ABNORMAL LOW (ref 39.0–52.0)
Hemoglobin: 10.3 g/dL — ABNORMAL LOW (ref 13.0–17.0)
MCH: 32.2 pg (ref 26.0–34.0)
MCHC: 32.6 g/dL (ref 30.0–36.0)
MCV: 98.8 fL (ref 80.0–100.0)
Platelets: 191 10*3/uL (ref 150–400)
RBC: 3.2 MIL/uL — ABNORMAL LOW (ref 4.22–5.81)
RDW: 12.8 % (ref 11.5–15.5)
WBC: 9.8 10*3/uL (ref 4.0–10.5)
nRBC: 0 % (ref 0.0–0.2)

## 2019-11-26 NOTE — Progress Notes (Signed)
Admit: 11/21/2019 LOS: 5  65M ESRD MWF [facility] with Seizures and new brain mets, hx/o RCC   Subjective:   Seen on HD today, last hd sat. No complaints currently, tolerating treatment. Back on mwf schedule  bp 119/65, hr 53. qbf 426mL/min, UF goal 3L  I was present at this dialysis session. I have reviewed the session itself and made appropriate changes.  Objective  Hemodialysis Rx: 2K, 2.25Cal, 137Na, BFR 400, DFR 800 Dialyzer F180 Treatment time 3.5hrs   07/18 0701 - 07/19 0700 In: 560 [P.O.:560] Out: -   Filed Weights   11/24/19 0754 11/24/19 1143 11/26/19 0700  Weight: 83 kg 80.5 kg 84.3 kg    Scheduled Meds:  Chlorhexidine Gluconate Cloth  6 each Topical Q0600   Chlorhexidine Gluconate Cloth  6 each Topical Q0600   dexamethasone  4 mg Oral Q6H   insulin aspart  0-5 Units Subcutaneous QHS   insulin aspart  0-9 Units Subcutaneous TID WC   levETIRAcetam  500 mg Oral BID   lidocaine-prilocaine  1 application Topical Q M,W,F-HD   metoprolol succinate  25 mg Oral Daily   mometasone-formoterol  2 puff Inhalation BID   pantoprazole  40 mg Oral Daily   sevelamer carbonate  800 mg Oral TID WC   sodium chloride flush  3 mL Intravenous Q12H   Continuous Infusions:  sodium chloride Stopped (11/24/19 1336)   PRN Meds:.sodium chloride, acetaminophen **OR** acetaminophen, albuterol, ipratropium-albuterol, LORazepam, ondansetron **OR** ondansetron (ZOFRAN) IV, oxyCODONE, sodium chloride flush  Current Labs: reviewed  Recent Labs  Lab 11/23/19 0951 11/24/19 0628 11/26/19 0820  NA 136 136 135  K 4.2 4.8 3.8  CL 98 97* 99  CO2 26 24 24   GLUCOSE 271* 140* 132*  BUN 38* 61* 48*  CREATININE 6.52* 8.29* 5.44*  CALCIUM 7.0* 7.5* 7.9*  PHOS  --   --  3.1   Recent Labs  Lab 11/21/19 0343 11/21/19 0343 11/21/19 0348 11/22/19 0205 11/26/19 0814  WBC 5.1  --   --  6.2 9.8  NEUTROABS 3.7  --   --   --   --   HGB 9.3*   < > 9.5* 9.2* 10.3*  HCT  29.2*   < > 28.0* 29.0* 31.6*  MCV 101.0*  --   --  101.0* 98.8  PLT 151  --   --  153 191   < > = values in this interval not displayed.      Physical Exam:  Blood pressure 135/66, pulse (!) 56, temperature 98.5 F (36.9 C), temperature source Oral, resp. rate 17, height 5\' 6"  (1.676 m), weight 84.3 kg, SpO2 99 %. NAD, conversant RRR  CTAB LUE AVF +B/T, bandaged No sig [name]  A 1. ESRD MWF [facility] LUE AVF 2. Metastatic RCC, new brain metastases, per neuro/oncology; possibly for palliative XRT 3. Seizures, neuro; Keppra 4. CKD-BMD, cont binders 5. Anemia, hold ESA, Hb 9s 6. Hyperkalemia, resolved  P  HD today with UF goal 3L as tolerated: 3.5h, 2K, No heparin, LUE AVF  Will run on dialysis again on Wed  Medication Issues; o Preferred narcotic agents for pain control are hydromorphone, fentanyl, and methadone. Morphine should not be used.  o Baclofen should be avoided o Avoid oral sodium phosphate and magnesium citrate based laxatives / bowel preps     Gean Quint, MD [place]

## 2019-11-26 NOTE — Progress Notes (Signed)
I spoke with the patient and again with [name] and we reviewed the rationale for radiotherapy with SRS technique. I have been in contact with nephrology as well and Dr. Candiss Norse has given direction on contrast for when the patient has a 3T MRI and for CT imaging with our simulation. Orders were placed for MRI brain. Will review when results are available and if doing well tomorrow, may make modifications to his steroid dosing.     Carola Rhine, PAC

## 2019-11-26 NOTE — Consult Note (Addendum)
Radiation Oncology         ([phone]) [phone] ________________________________  Name: [name]        MRN: [id number]  Date of Service: 11/26/19             DOB: [birth year]  EL:[id number], [name] Reichmann, MD        REFERRING PHYSICIAN: Dr. Benay Spice   DIAGNOSIS: The primary encounter diagnosis was Paresthesia. A diagnosis of Left sided numbness was also pertinent to this visit, brain metastases, renal cell carcinoma.   HISTORY OF PRESENT ILLNESS: [name] is a 86 y.o. male seen at the request of Dr. Benay Spice for a history of remote stage I NSCLC of the LUL, and a history of left renal cell carcinoma. The patient was diagnosed with Stage IA2, cT1bN0M0, NSCLC, SCC of the RUL in 2020 and received SBRT. He tolerated this well and was going to be followed by Dr. Tressie Stalker at [place].  Recent imaging in the spring 2021 revealed concerns for progressive change in his lungs.  He was also going to transition back to his care in [place] and would be under the care of Dr. Benay Spice.  A PET on 09/17/2019 revealed multifocal pulmonary disease throughout the chest and a destructive lesion in the right iliac crest.  He underwent CT-guided biopsy on 10/02/2019 and final pathology from the biopsy of the bone revealed metastatic carcinoma consistent with his prior renal cell carcinoma.  In the midst of all of this, the patient unfortunately had a fall and fractured his proximal femur on the right.  He underwent 6 weeks of intense rehabilitation, he also has end-stage renal disease and has been on hemodialysis for many years.  He mentioned during a dialysis session that he was starting to have some numbness on the left side of his body, this was felt to be related to neuropathy, it did not get worked up until he had been discharged from the facility and then presented on the date of admission to the emergency department with progressive numbness on the left side.  When he came in, a CT of the head without contrast on 11/21/2019  revealed bilateral cerebral edema and an MRI without contrast was performed revealing a high and anterior right parietal mass measuring 11 mm with extensive vasogenic edema, a left frontal mass measuring 7 mm with moderate vasogenic edema, a 4 mm diffuse hyperintense lesion, and a left frontal lesion not measured.  There was significant edema in the frontal region as well.  He has been started on steroids and is currently receiving dexamethasone 4 mg every 6 hours.  It appears in the review of his history as well that there was some concern for seizure-like activity and he was started on Keppra as well.  While the patient does not have systemic options for treatment, and will be set up with hospice, Dr. Malachy Mood feels that his prognosis is multiple months, and we have been contacted to consider options of radiotherapy in this patient.   PREVIOUS RADIATION THERAPY: Yes   08/15/18-08/22/18 SBRT Treatment: The RUL target was treated to 54 Gy in 3 fractions   PAST MEDICAL HISTORY:  Past Medical History:  Diagnosis Date  . Arthritis    ankle , hip, knees , low back   . BPH (benign prostatic hypertrophy)   . Cancer ([place])    lung  . Cancer ([place]) 2001   renal  . 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 /w prednisone x 17 yrs.  has been d/c for 3 yrs.   . Chronic kidney disease       . CIDP (chronic inflammatory demyelinating polyneuropathy) ([place])   . COPD (chronic obstructive pulmonary disease) ([place])    told that he has "a bit of emphysema"  . Dialysis patient ([place])   . GERD (gastroesophageal reflux disease)   . Hypertension    stress test done - many years ago  . Kidney calculus   . Pneumonia        PAST SURGICAL HISTORY: Past Surgical History:  Procedure Laterality Date  . ANKLE FUSION     R ankle  . AV FISTULA PLACEMENT  06/01/2011   Procedure: ARTERIOVENOUS (AV) FISTULA CREATION;  Surgeon: Angelia Mould, MD;  Location: [facility] OR;   Service: Vascular;  Laterality: Left;  Creation of Left Arteriovenous fistula  . AV FISTULA PLACEMENT Left 07/12/2012   Procedure: ARTERIOVENOUS (AV) FISTULA CREATION;  Surgeon: Conrad [place], MD;  Location: [place];  Service: Vascular;  Laterality: Left;  . AV FISTULA PLACEMENT Left 4.11.14  . BASCILIC VEIN TRANSPOSITION Left 08/28/2012   Procedure: BASCILIC VEIN TRANSPOSITION;  Surgeon: Conrad Humacao, MD;  Location: [place];  Service: Vascular;  Laterality: Left;  left 2nd stage basilic vein transposition  . CHOLECYSTECTOMY    . COLONOSCOPY  2005   Dr. Laural Golden, diverticulosis, hemorrhoids, 2 small polyps  . FISTULOGRAM N/A 08/16/2011   Procedure: FISTULOGRAM;  Surgeon: Angelia Mould, MD;  Location: [facility] CATH LAB;  Service: Cardiovascular;  Laterality: N/A;  . fistulogram left radial cephalic AV fistula  [phone]     Dr. Scot Dock  . HIP ARTHROPLASTY Right    "partial"  . JOINT REPLACEMENT     L knee- 2010- [facility], L hip partial replacement   . LUNG REMOVAL, PARTIAL  2003   partial, right  . NEPHRECTOMY  2001   left  . SHUNTOGRAM N/A 05/15/2012   Procedure: Earney Mallet;  Surgeon: Conrad [place], MD;  Location: [facility] C[facility] LAB;  Service: Cardiovascular;  Laterality: N/A;  . SHUNTOGRAM N/A 05/09/2014   Procedure: FISTULOGRAM;  Surgeon: Conrad [place], MD;  Location: [facility] CATH LAB;  Service: Cardiovascular;  Laterality: N/A;     FAMILY HISTORY:  Family History  Problem Relation Age of Onset  . Heart disease Mother   . Heart disease Father   . Heart attack Father   . Anesthesia problems Neg Hx   . Hypotension Neg Hx   . Malignant hyperthermia Neg Hx   . Pseudochol deficiency Neg Hx   . Colon cancer Neg Hx      SOCIAL HISTORY:  reports that he quit smoking about 42 years ago. His smoking use included cigarettes. He quit after 35.00 years of use. His smokeless tobacco use includes chew. He reports that he does not drink alcohol and does not use drugs.   ALLERGIES: Patient has no known  allergies.   MEDICATIONS:  Current Facility-Administered Medications  Medication Dose Route Frequency Provider Last Rate Last Admin  . 0.9 %  sodium chloride infusion  250 mL Intravenous PRN Manuella Ghazi, Pratik D, DO   Paused at 11/24/19 1336  . acetaminophen (TYLENOL) tablet 650 mg  650 mg Oral Q6H PRN Manuella Ghazi, Pratik D, DO       Or  . acetaminophen (TYLENOL) suppository 650 mg  650 mg Rectal Q6H PRN Manuella Ghazi, Pratik D, DO      . albuterol (PROVENTIL) (2.5 MG/3ML) 0.083% nebulizer solution 3 mL  3 mL Nebulization Q4H PRN Manuella Ghazi,  Pratik D, DO      . Chlorhexidine Gluconate Cloth 2 % PADS 6 each  6 each Topical Q0600 Donato Heinz, MD   6 each at 11/26/19 [phone]  . Chlorhexidine Gluconate Cloth 2 % PADS 6 each  6 each Topical Q0600 Claudia Desanctis, MD   6 each at 11/26/19 [phone]  . dexamethasone (DECADRON) tablet 4 mg  4 mg Oral Q6H Arrien, Jimmy Picket, MD   4 mg at 11/26/19 0601  . insulin aspart (novoLOG) injection 0-5 Units  0-5 Units Subcutaneous QHS Heath Lark D, DO   2 Units at 11/25/19 2130  . insulin aspart (novoLOG) injection 0-9 Units  0-9 Units Subcutaneous TID WC Shah, Pratik D, DO   2 Units at 11/25/19 1742  . ipratropium-albuterol (DUONEB) 0.5-2.5 (3) MG/3ML nebulizer solution 3 mL  3 mL Nebulization Q6H PRN Manuella Ghazi, Pratik D, DO      . levETIRAcetam (KEPPRA) tablet 500 mg  500 mg Oral BID Tawni Millers, MD   500 mg at 11/25/19 2126  . lidocaine-prilocaine (EMLA) cream 1 application  1 application Topical Q M,W,F-HD Manuella Ghazi, Pratik D, DO      . LORazepam (ATIVAN) injection 2 mg  2 mg Intravenous Q4H PRN Manuella Ghazi, Pratik D, DO      . metoprolol succinate (TOPROL-XL) 24 hr tablet 25 mg  25 mg Oral Daily Manuella Ghazi, Pratik D, DO   25 mg at 11/25/19 1036  . mometasone-formoterol (DULERA) 100-5 MCG/ACT inhaler 2 puff  2 puff Inhalation BID Manuella Ghazi, Pratik D, DO   2 puff at 11/25/19 2117  . ondansetron (ZOFRAN) tablet 4 mg  4 mg Oral Q6H PRN Manuella Ghazi, Pratik D, DO       Or  . ondansetron (ZOFRAN) injection 4  mg  4 mg Intravenous Q6H PRN Manuella Ghazi, Pratik D, DO      . oxyCODONE (Oxy IR/ROXICODONE) immediate release tablet 5 mg  5 mg Oral Q6H PRN Manuella Ghazi, Pratik D, DO      . pantoprazole (PROTONIX) EC tablet 40 mg  40 mg Oral Daily Manuella Ghazi, Pratik D, DO   40 mg at 11/25/19 1036  . sevelamer carbonate (RENVELA) tablet 800 mg  800 mg Oral TID WC Shah, Pratik D, DO   800 mg at 11/25/19 1741  . sodium chloride flush (NS) 0.9 % injection 3 mL  3 mL Intravenous Q12H Shah, Pratik D, DO   3 mL at 11/25/19 2131  . sodium chloride flush (NS) 0.9 % injection 3 mL  3 mL Intravenous PRN Manuella Ghazi, Pratik D, DO         REVIEW OF SYSTEMS: I was unable to converse with the patient as he is currently in hemodialysis but I spoke with his healthcare power of attorney [name], his niece, who feels that he is doing much better since coming into the hospital and since being on steroids.     PHYSICAL EXAM:  Wt Readings from Last 3 Encounters:  11/26/19 185 lb 13.6 oz (84.3 kg)  10/23/19 180 lb (81.6 kg)  10/06/19 176 lb 12.9 oz (80.2 kg)   Temp Readings from Last 3 Encounters:  11/26/19 98.5 F (36.9 C) (Oral)  10/16/19 97.7 F (36.5 C) (Temporal)  10/06/19 99.1 F (37.3 C) (Oral)   BP Readings from Last 3 Encounters:  11/26/19 131/67  10/16/19 140/70  10/06/19 (!) 143/72   Pulse Readings from Last 3 Encounters:  11/26/19 (!) 44  10/16/19 67  10/06/19 80   Pain Assessment Pain Score: 0-No  pain/10 Unable to assess given encounter type.   ECOG = 2  0 - Asymptomatic (Fully active, able to carry on all predisease activities without restriction)  1 - Symptomatic but completely ambulatory (Restricted in physically strenuous activity but ambulatory and able to carry out work of a light or sedentary nature. For example, light housework, office work)  2 - Symptomatic, <50% in bed during the day (Ambulatory and capable of all self care but unable to carry out any work activities. Up and about more than 50% of  waking hours)  3 - Symptomatic, >50% in bed, but not bedbound (Capable of only limited self-care, confined to bed or chair 50% or more of waking hours)  4 - Bedbound (Completely disabled. Cannot carry on any self-care. Totally confined to bed or chair)  5 - Death   Eustace Pen MM, Creech RH, Tormey DC, et al. [phone]). "Toxicity and response criteria of the [facility]". [place] Oncol. 5 (6): 649-55    LABORATORY DATA:  Lab Results  Component Value Date   WBC 9.8 11/26/2019   HGB 10.3 (L) 11/26/2019   HCT 31.6 (L) 11/26/2019   MCV 98.8 11/26/2019   PLT 191 11/26/2019   Lab Results  Component Value Date   NA 135 11/26/2019   K 3.8 11/26/2019   CL 99 11/26/2019   CO2 24 11/26/2019   Lab Results  Component Value Date   ALT 13 11/21/2019   AST 12 (L) 11/21/2019   ALKPHOS 150 (H) 11/21/2019   BILITOT 0.6 11/21/2019      RADIOGRAPHY: CT HEAD WO CONTRAST  Result Date: 11/21/2019 CLINICAL DATA:  Numbness in jerking of the left arm and leg since Friday. History of renal and lung cancer. EXAM: CT HEAD WITHOUT CONTRAST TECHNIQUE: Contiguous axial images were obtained from the base of the skull through the vertex without intravenous contrast. COMPARISON:  None. FINDINGS: Brain: Vasogenic edema appearance in the left anterior frontal and right frontal parietal areas that is moderately extensive. There is a superimposed 1 cm high-density nodule in the left frontal lobe at the level of edema. No reported history of clinical infection. No evidence of infarct, hematoma, hydrocephalus, or collection. Generalized brain atrophy. Vascular: No hyperdense vessel or unexpected calcification. Skull: Negative Sinuses/Orbits: Negative IMPRESSION: Bilateral cerebral cytotoxic edema, history and multifocality favoring metastatic disease to the brain. Electronically Signed   By: Monte Fantasia M.D.   On: 11/21/2019 04:34   MR BRAIN WO CONTRAST  Result Date: 11/21/2019 CLINICAL DATA:   Small cell lung cancer.  Abnormal head CT EXAM: MRI HEAD WITHOUT CONTRAST TECHNIQUE: Multiplanar, multiecho pulse sequences of the brain and surrounding structures were obtained without intravenous contrast. COMPARISON:  Head CT from earlier today FINDINGS: Brain: T2 isointense anterior left frontal mass measuring 7 mm, with moderate vasogenic edema. The lesion has a ring like diffusion hyperintense appearance. There is active metastatic disease by May 2021 PET CT. High and anterior right parietal mass measuring 11 mm with extensive vasogenic edema. T2 and diffusion hyperintense lesion in the superior right cerebellum measuring 4 mm. No acute hemorrhage, hydrocephalus, collection, or acute infarct. Small remote right cerebellar infarction. Chronic small vessel ischemia in the cerebral white matter. Brain atrophy with ventriculomegaly. Vascular: Normal flow voids Skull and upper cervical spine: No focal marrow lesion is seen. Sinuses/Orbits: Negative Other: Motion degraded brain MRI. Noncontrast study.  There is chart history of dialysis. IMPRESSION: Metastatic disease to the brain with left frontal and right parietal lesions measuring  up to 11 mm and associated with moderate to extensive vasogenic edema. Probable third lesion in the superior right cerebellum measuring 4 mm. Electronically Signed   By: Monte Fantasia M.D.   On: 11/21/2019 08:03   EEG adult  Result Date: 11/24/2019 Lora Havens, MD     11/24/2019  8:21 PM Patient Name: [name] MRN: [id number] Epilepsy Attending: Lora Havens Referring Physician/Provider: Dr Amie [place] Date: 11/24/2019 Duration: 25.52 mins Patient history: 86 year old male with a history of renal and lung cancer, presenting with seizure like activity. MRI brain reveals bilateral cerebral metastases. EEG to evaluate for seizure Level of alertness: Awake, asleep AEDs during EEG study: LEV Technical aspects: This EEG study was done with scalp electrodes positioned  according to the 10-20 International system of electrode placement. Electrical activity was acquired at a sampling rate of 500Hz  and reviewed with a high frequency filter of 70Hz  and a low frequency filter of 1Hz . EEG data were recorded continuously and digitally stored. Description: The posterior dominant rhythm consists of 9 Hz activity of moderate voltage (25-35 uV) seen predominantly in posterior head regions, symmetric and reactive to eye opening and eye closing. Sleep was characterized by vertex waves, sleep spindles (12 to 14 Hz), maximal frontocentral region. Hyperventilation and photic stimulation were not performed.   IMPRESSION: This study is within normal limits. No seizures or epileptiform discharges were seen throughout the recording. Brady   DG HIP UNILAT WITH PELVIS 2-3 VIEWS RIGHT  Result Date: 11/13/2019 DG HIP UNILAT W OR W/O PELVIS 2-3 VIEWS RIGHT Ap pelvis and ap lat right hip for fracture gr trochanter The fracture looks healed no displacement Prosthesis is stable       IMPRESSION/PLAN: 1. Recurrent Metastatic Renal Cell Carcinoma with disease in the bone and brain. Dr. Lisbeth Renshaw will review the patient's case tomorrow when he returns. While the patient is elderly and has comorbidities, up until his recent fracture, his quality of life has been good. His niece [name] is his HCPOA and is interested in making sure we maintain his quality of life in the treatment we recommend. Given his histology and prior performance status, and desires to keep quality of life at the forefront, I believe Dr. Lisbeth Renshaw would like to offer stereotactic radiosurgery [facility]) to these sites. I've also confirmed with Dr. Benay Spice that while hospice care will be recommended, he is not actively dying, and has prognosis of multiple months. We discussed the risks, benefits, short, and long term effects of radiotherapy, and the patient is interested in proceeding. I explained the persons involved  including neurosurgery, and Mont Dutton, RT the brain oncology navigator. We discussed the delivery and logistics of radiotherapy and I anticipate that Dr. Lisbeth Renshaw would offer a course of 1 fraction of SRS. We will need to coordinate 3T MRI, and simulation. The patient was at dialysis when I called so I will try to reach back out to him later today to make sure he is in agreement.  2. Steroid Dosing, possible seizure. The patient will continue Keppra and Dexamethasone. Dr. Mickeal Skinner will see him as an outpatient as well to help with management of the [place] and follow up after therapy. Hopefully the patient will be feeling better and perhaps we can dose down his steroids today.  3. ESRD on HD. The patient will continue his usual MWF dialysis schedule. I have reached out to nephrology to see if they are ok with [place] still using contrast for his  MRI and simulation imaging to plan his treatment.  4.  Stage IA2, cT1bN0M0 NSCLC, squamous cell carcinoma of the RUL and Remote history of Stage I NSCLC of the left upper lobe. The patient does not appear to have active disease but these histories will be considered as we move forward with treatment.  In a visit lasting 45 minutes, greater than 50% of the time was spent by phone and in floor time discussing the patient's condition, in preparation for the discussion, and coordinating the patient's care.     Carola Rhine, PAC

## 2019-11-26 NOTE — Progress Notes (Signed)
The chaplain was pastorally present for F/U spiritual care.  The Pt. shared with the chaplain he anticipates a MRI in the near future to help determine the next steps, but feels pretty good right now.  The Pt. Smiled and accepted a copy of today's News and Record from the chaplain. The chaplain is available for F/U spiritual care as needed.

## 2019-11-26 NOTE — Progress Notes (Signed)
PROGRESS NOTE    [name]  CZY:[id number] DOB: July 10, 1930 DOA: 11/21/2019 PCP: Sharilyn Sites, MD    Brief Narrative:  Patient admitted to the hospital with the working diagnosis of new metastatic brain lesions with vosegenic edema, in the setting of bronchoalveolar carcinoma. (transferred from [facility] to [facility] on 11/22/19)  86 year old male with past medical history for end-stage renal disease on hemodialysis, COPD, bronchoalveolar carcinoma, hypertension, renal cell carcinoma status post left nephrectomy, GERD, and recent right greater trochanteric fracture. Patient reported about 5 days of left sided weakness and paresthesias. Patient had progressive symptoms to the point where he was having difficulty ambulating. On the day of admission he was noted to have jerking movements, generalized,but self resolved after a few minutes. No loss of consciousness. On his initial physical examination blood pressure 143/65, heart rate 73, respiratory 24-26, oxygen saturation 98%. His lungs are clear to auscultation bilaterally, heart S1-S2, present rhythmic, soft abdomen, no lower extremity edema. He had decreased strength left upper extremity. Sodium 136, potassium 4.7, chloride 98, bicarb 24, glucose 123, BUN 48, creatinine 9.22, AST 12, ALT 13, white count 5.1, hemoglobin 9.3, hematocrit 39.2, platelets 151. SARS COVID-19 negative.  Head CT with bilateral cerebral cytotoxic edema. Brain MRI with metastatic disease of the brain with a left frontal and right parietal lesions measuring up to 11 mm associated with moderate to extensive vasogenic edema. Possible third lesion in the superior right cerebellum, 4 mm. EKG 73 bpm, left axis deviation, left anterior fascicular block, first-degree AV block, normal QTC, sinus rhythm, no ST segment or T wave changes.  Patient has been placed on Keppra for seizure control. No further seizures. Neurosurgery recommended radiation therapy, no indication for  surgical intervention.   Oncology has been consulted along with nephrology and palliative care.Plan to start radiation therapy, patient will need brain MRI 3T and simulation, before treatment.     Assessment & Plan:   Principal Problem:   Metastasis to brain [facility] Active Problems:   End stage renal disease (HCC)   COPD (chronic obstructive pulmonary disease) (HCC)   Hypertension   GERD (gastroesophageal reflux disease)   Periprosthetic fracture around internal prosthetic right hip joint ([place]) 10/04/2019   Seizure ([place])   Bronchoalveolar carcinoma ([place])   Renal cell carcinoma ([place])   Palliative care by specialist   Goals of care, counseling/discussion   DNR (do not resuscitate)   1. New onset brain metastatic lesions, complicated vasogenic brain edema and seizures.EEG with no active seizures.   Continue with oralKeppra and dexamethasone. Plan for Brain MRI T3 with simulation before starting radiation therapy.   2. ESRD on HD. hyperkalemia.Onsevelamer. Patient had HD today with no major complications.   3. HTN. Continue metoprolol for blood pressure control.   4. Bronchoalveolar carcinoma/ metastatic renal cell carcinoma. Patient follows with Dr. Benay Spice.  Old records personally reviewed, last visit 06/08. Diagnoses with metastatic renal cell carcinoma, bilateral lung nodules and hypermetabolic mediastinal node. Plan was to hold on chemotherapy and continue close observation with repeat CT in 3 to 4 months interval.  Pending final recommendations from radiation oncology.   4. Right greater trochanter hip fracture. Old records personally reviewed, orthopedic follow up on -7/06, healing lesion well.  Continue PT and OT at home, will need home health services.   5. COPD. Noclinical signs of acute exacerbation. On bronchodilator therapy and dulera.   6. Controlled T2DM Hgb A1c 5,9.On withinsulin sliding scale for glucose cover and monitoring. Patient is  tolerating po well.  Status is: Inpatient  Remains inpatient appropriate because:Inpatient level of care appropriate due to severity of illness   Dispo: The patient is from: Home              Anticipated d/c is to: Home              Anticipated d/c date is: 1 day              Patient currently is not medically stable to d/c.   DVT prophylaxis: Enoxaparin   Code Status:   dnr   Family Communication:  No family at the bedside       Consultants:   Neurology   Radiation Oncology   Oncology   Palliative Care   Subjective: Patient is feeling well, no nausea or vomiting, no headache, no recurrent seizures. Had HD today.   Objective: Vitals:   11/26/19 0930 11/26/19 1000 11/26/19 1030 11/26/19 1115  BP: 131/67 (!) 122/59 (!) 124/47 135/62  Pulse: (!) 44 (!) 54 (!) 53 (!) 55  Resp:      Temp:    (!) 97.2 F (36.2 C)  TempSrc:    Oral  SpO2:    100%  Weight:    80.6 kg  Height:        Intake/Output Summary (Last 24 hours) at 11/26/2019 1216 Last data filed at 11/26/2019 1041 Gross per 24 hour  Intake 320 ml  Output 2500 ml  Net -2180 ml   Filed Weights   11/24/19 1143 11/26/19 0700 11/26/19 1115  Weight: 80.5 kg 84.3 kg 80.6 kg    Examination:   General: Not in pain or dyspnea, Neurology: Awake and alert, non focal  E ENT: no pallor, no icterus, oral mucosa moist Cardiovascular: No JVD. S1-S2 present, rhythmic, no gallops, rubs, or murmurs. No lower extremity edema. Pulmonary: positive breath sounds bilaterally, adequate air movement, no wheezing, rhonchi or rales. Gastrointestinal. Abdomen soft and non tender Skin. No rashes Musculoskeletal: no joint deformities     Data Reviewed: I have personally reviewed following labs and imaging studies  CBC: Recent Labs  Lab 11/21/19 0343 11/21/19 0348 11/22/19 0205 11/26/19 0814  WBC 5.1  --  6.2 9.8  NEUTROABS 3.7  --   --   --   HGB 9.3* 9.5* 9.2* 10.3*  HCT 29.2* 28.0* 29.0* 31.6*  MCV 101.0*  --   101.0* 98.8  PLT 151  --  153 476   Basic Metabolic Panel: Recent Labs  Lab 11/21/19 0343 11/21/19 0343 11/21/19 0348 11/22/19 0205 11/23/19 0951 11/24/19 0628 11/26/19 0820  NA 136   < > 137 132* 136 136 135  K 4.7   < > 4.6 5.8* 4.2 4.8 3.8  CL 98   < > 97* 98 98 97* 99  CO2 24  --   --  22 26 24 24   GLUCOSE 123*   < > 121* 232* 271* 140* 132*  BUN 48*   < > 42* 63* 38* 61* 48*  CREATININE 9.22*   < > 9.90* 10.86* 6.52* 8.29* 5.44*  CALCIUM 7.5*  --   --  7.4* 7.0* 7.5* 7.9*  MG  --   --   --  2.0  --   --   --   PHOS  --   --   --   --   --   --  3.1   < > = values in this interval not displayed.   GFR: Estimated Creatinine Clearance: 9.5 mL/min (  A) (by C-G formula based on SCr of 5.44 mg/dL (H)). Liver Function Tests: Recent Labs  Lab 11/21/19 0343 11/26/19 0820  AST 12*  --   ALT 13  --   ALKPHOS 150*  --   BILITOT 0.6  --   PROT 6.4*  --   ALBUMIN 3.1* 2.6*   No results for input(s): LIPASE, AMYLASE in the last 168 hours. No results for input(s): AMMONIA in the last 168 hours. Coagulation Profile: Recent Labs  Lab 11/21/19 0343  INR 1.0   Cardiac Enzymes: No results for input(s): CKTOTAL, CKMB, CKMBINDEX, TROPONINI in the last 168 hours. BNP (last 3 results) No results for input(s): PROBNP in the last 8760 hours. HbA1C: No results for input(s): HGBA1C in the last 72 hours. CBG: Recent Labs  Lab 11/25/19 1639 11/25/19 2111 11/26/19 0611 11/26/19 0618 11/26/19 1142  GLUCAP 174* 207* 142* 157* 143*   Lipid Profile: No results for input(s): CHOL, HDL, LDLCALC, TRIG, CHOLHDL, LDLDIRECT in the last 72 hours. Thyroid Function Tests: No results for input(s): TSH, T4TOTAL, FREET4, T3FREE, THYROIDAB in the last 72 hours. Anemia Panel: Recent Labs    11/24/19 0809  FERRITIN 1,109*      Radiology Studies: I have reviewed all of the imaging during this hospital visit personally     Scheduled Meds: . Chlorhexidine Gluconate Cloth  6 each  Topical Q0600  . Chlorhexidine Gluconate Cloth  6 each Topical Q0600  . dexamethasone  4 mg Oral Q6H  . insulin aspart  0-5 Units Subcutaneous QHS  . insulin aspart  0-9 Units Subcutaneous TID WC  . levETIRAcetam  500 mg Oral BID  . lidocaine-prilocaine  1 application Topical Q M,W,F-HD  . metoprolol succinate  25 mg Oral Daily  . mometasone-formoterol  2 puff Inhalation BID  . pantoprazole  40 mg Oral Daily  . sevelamer carbonate  800 mg Oral TID WC  . sodium chloride flush  3 mL Intravenous Q12H   Continuous Infusions: . sodium chloride Stopped (11/24/19 1336)     LOS: 5 days        Katrin [name] Gerome Apley, MD

## 2019-11-26 NOTE — Progress Notes (Signed)
Pt on dialysis, needs contrasted MRI to be done tomorrow 7/20 and dialysis to follow on Wednesday 7/21. Delay between contrast and dialysis is ok per Dr. Candiss Norse in Nephrology. Also Dr. Posey Pronto in Radiology.

## 2019-11-26 NOTE — Progress Notes (Addendum)
IP PROGRESS NOTE  Subjective:   Had dialysis this am and tolerated well. Denies headaches. No recurrent seizures. Radiation Oncology consult performed this am.   Objective: Vital signs in last 24 hours: Blood pressure 131/67, pulse (!) 44, temperature 98.5 F (36.9 C), temperature source Oral, resp. rate 17, height 5\' 6"  (1.676 m), weight 84.3 kg, SpO2 99 %.  Intake/Output from previous day: 07/18 0701 - 07/19 0700 In: 58 [P.O.:560] Out: -   Physical Exam:  HEENT: No thrush Lungs: Clear bilaterally Cardiac: Regular rate and rhythm Abdomen: Nontender, no mass, no hepatosplenomegaly Extremities: No leg edema Neurologic: Alert and oriented.  The motor exam appears intact in the upper and lower extremities bilaterally.  I did not test the gait.  Lab Results: Recent Labs    11/26/19 0814  WBC 9.8  HGB 10.3*  HCT 31.6*  PLT 191    BMET Recent Labs    11/24/19 0628 11/26/19 0820  NA 136 135  K 4.8 3.8  CL 97* 99  CO2 24 24  GLUCOSE 140* 132*  BUN 61* 48*  CREATININE 8.29* 5.44*  CALCIUM 7.5* 7.9*     Medications: I have reviewed the patient's current medications.   Assessment/plan:  1. Metastatic renal cell carcinoma              Right nephrectomy April 2001, T2N0 lesion ? CT biopsy of a right iliac lesion on 10/02/2019-metastatic carcinoma, cytokeratin 7, racemase, and PAX8 positive ? CT chest 08/28/2019-evolving post radiation changes in the right upper lobe, enlarging bilateral lung nodules ? PET scan 09/18/2019-hypermetabolic right paratracheal node, post radiation changes in the right upper lobe with associated hypermetabolism, mildly hypermetabolic lung nodules, intensely hypermetabolic lytic lesion at the right iliac crest, focal hypermetabolism of the right colon corresponding to an area of diverticular disease ? MRI brain 11/21/2019-at least 3 brain metastases, vasogenic edema 2. Stage I left upper lung cancer 2003, status post a left upper  lobectomy 3. Squamous cell carcinoma of the right upper lobe, clinical stage IA2, status post SBRT in 3 fractions April 2020 4. 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 COPD 6. Hypertension 7. End-stage renal disease maintained on hemodialysis since 2015 8. Fall with a fracture of the lateral cortex of the proximal right femur 10/04/2019-nonoperative 9. Admission 11/21/2019 with numbness/tingling in the left upper and lower extremity, inability to ambulate  MRI brain 11/21/2019 confirmed multiple brain metastases, started on Decadron and Keppra  Mr. Boyajian appears stable.  He presented with left-sided neurologic symptoms, likely partial seizures.  The brain MRI is consistent with metastases.  He has a history of lung cancer.  I suspect the brain metastases are related to renal cell carcinoma.  Mr. Nofziger has multiple comorbid conditions including end-stage renal failure on hemodialysis.  He is not a candidate for systemic therapy. He was seen by radiation oncology who is planning SRS in the near future.   Previously discussed with niece, [name], by telephone.  She understands the poor prognosis.  She agrees to hospice care following the completion of radiation.  He may require nursing facility placement.  Recommendations: 1.  Continue Decadron and Keppra, taper Decadron as recommended by radiation oncology 2.  SRS and 3T MRI per radiation oncology 3.  Okay to discharge from an oncology standpoint once otherwise medically stable. 4.  [place] [facility] consult for care following discharge from the hospital   LOS: 5 days   Mikey Bussing,  NP   11/26/2019, 10:49 AM I saw Mr. Schaller in hemodialysis early this morning.  He was alert and oriented.  He reports no recurrent seizures.  He has "clumsiness "of the left hand.  The motor exam appeared intact.  He will be seen by radiation oncology today to consider palliative  brain radiation. I recommend hospice care at discharge.

## 2019-11-27 ENCOUNTER — Inpatient Hospital Stay (HOSPITAL_COMMUNITY): Payer: Medicare Other

## 2019-11-27 LAB — GLUCOSE, CAPILLARY
Glucose-Capillary: 159 mg/dL — ABNORMAL HIGH (ref 70–99)
Glucose-Capillary: 247 mg/dL — ABNORMAL HIGH (ref 70–99)
Glucose-Capillary: 264 mg/dL — ABNORMAL HIGH (ref 70–99)
Glucose-Capillary: 212 mg/dL — ABNORMAL HIGH (ref 70–99)

## 2019-11-27 LAB — HEPATITIS B SURFACE ANTIBODY, QUANTITATIVE: Hep B S AB Quant (Post): <3.1 m[IU]/mL — ABNORMAL LOW (ref >9.9)

## 2019-11-27 MED ORDER — LEVETIRACETAM 500 MG PO TABS: 500.0000 mg | ORAL_TABLET | Freq: Two times a day (BID) | ORAL | 0 refills | Status: AC

## 2019-11-27 MED ORDER — DEXAMETHASONE 4 MG PO TABS: 4.0000 mg | ORAL_TABLET | Freq: Three times a day (TID) | ORAL | 0 refills | Status: DC

## 2019-11-27 MED ORDER — GADOBUTROL 1 MMOL/ML IV SOLN
8.0000 mL | Freq: Once | INTRAVENOUS | Status: AC | PRN
  Administered 2019-11-27: 8 mL via INTRAVENOUS

## 2019-11-27 MED ORDER — DEXAMETHASONE 4 MG PO TABS
4.0000 mg | ORAL_TABLET | Freq: Three times a day (TID) | ORAL | Status: DC
  Administered 2019-11-27 – 2019-11-28 (×3): 4 mg via ORAL
  Filled 2019-11-27 (×3): qty 1

## 2019-11-27 NOTE — Discharge Summary (Signed)
Physician Discharge Summary  [name] TML:[id number] DOB: [birth year] DOA: 11/21/2019  PCP: Sharilyn Sites, MD  Admit date: 11/21/2019 Discharge date: 11/27/2019  Admitted From: Home  Disposition: Home   Recommendations for Outpatient Follow-up and new medication changes:  1. Follow up with Dr. Hilma Favors in 7 days.  2. Follow with Radiation Oncology on Thursday  3. Continue oral dexamethasone for brain edema.  4. Continue with Keppra for seizure prophylaxis.  Home Health: yes   Equipment/Devices: no    Discharge Condition: stable  CODE STATUS:   Diet recommendation: renal prudent   Brief/Interim Summary: Patient was admitted to the hospital with the working diagnosis of new metastatic brain lesions with vosegenic edema, in the setting of bronchoalveolar carcinoma. (transferred from [facility] to [facility] on 11/22/19)  86 year old male with past medical history for end-stage renal disease on hemodialysis, COPD, bronchoalveolar carcinoma, hypertension, renal cell carcinoma status post left nephrectomy, GERD, and recent right greater trochanteric fracture. Patient reported about 5 days of left sided weakness and paresthesias. Patient had progressive symptoms to the point where he was having difficulty ambulating. On the day of admission he was noted to have jerking movements, generalized,but self resolved after a few minutes. No loss of consciousness. On his initial physical examination blood pressure 143/65, heart rate 73, respiratory rate 24-26, oxygen saturation 98%. His lungs were clear to auscultation bilaterally, heart S1-S2, present rhythmic, soft abdomen, no lower extremity edema. He had decreased strength left upper extremity. Sodium 136, potassium 4.7, chloride 98, bicarb 24, glucose 123, BUN 48, creatinine 9.22, AST 12, ALT 13, white count 5.1, hemoglobin 9.3, hematocrit 39.2, platelets 151. SARS COVID-19 negative.  Head CT with bilateral cerebral cytotoxic edema. Brain MRI with  metastatic disease of the brain with a left frontal and right parietal lesions measuring up to 11 mm associated with moderate to extensive vasogenic edema. Possible third lesion in the superior right cerebellum, 4 mm. EKG 73 bpm, left axis deviation, left anterior fascicular block, first-degree AV block, normal QTC, sinus rhythm, no ST segment or T wave changes.  Patient was placed on Keppra for seizure control. No further seizures. Neurosurgery recommended radiation therapy, no indication for surgical intervention.   Oncology has been consulted along with nephrology and palliative care.Plan to start radiation therapy.   1.  New onset brain metastatic lesions (left frontal lobe, right parietal lobe, possibly third lesion in the superior right cerebellum), complicated with vasogenic brain edema and seizures.  Patient was transferred [facility], further work-up with EEG showed no active seizures.  Patient was treated with Keppra and dexamethasone with good response.  Patient was seen by oncology, neurology, neurosurgery, and palliative care.  Plan is to receive radiation therapy as an outpatient after obtaining brain MRI today.  He will continue taking dexamethasone and Keppra.  Patient will have home health services.  2.  End-stage renal disease on hemodialysis, hyperkalemia.  Patient underwent hemodialysis during his hospitalization with good response.  His serum potassium improved down to 3.8, serum bicarb 24.  Patient will have his next hemodialysis as an outpatient tomorrow July 21.  3.  Hypertension.  Continue blood pressure control with metoprolol.  4.  Bronchoalveolar carcinoma/metastatic renal cell carcinoma.  Patient follows up with Dr. Benay Spice from oncology, his last outpatient visit was in June 8th, at that point he was diagnosed with metastatic renal cell carcinoma, bilateral lung nodules and hypermetabolic mediastinal nodes.  The plan at that point was to hold on chemotherapy  and continue close  observation with repeat CT in 3 to 4 months interval.  Patient continue follow-up with oncology as an outpatient.  5.  Right greater trochanter hip fracture.  Patient had last orthopedic follow-up July 6, his fracture was healing well. Patient will continue home health services with home PT/OT.  6.  COPD.  No signs of acute exacerbation, continue bronchodilator therapy.  7.  Controlled type II that is mellitus, hemoglobin A1c 5.9.  Patient received insulin sliding scale while hospitalized.  His glucose remained well controlled.    Discharge Diagnoses:  Principal Problem:   Metastasis to brain [facility]) Active Problems:   End stage renal disease (HCC)   COPD (chronic obstructive pulmonary disease) (HCC)   Hypertension   GERD (gastroesophageal reflux disease)   Periprosthetic fracture around internal prosthetic right hip joint ([place]) 10/04/2019   Seizure ([place])   Bronchoalveolar carcinoma ([place])   Renal cell carcinoma ([place])   Palliative care by specialist   Goals of care, counseling/discussion   DNR (do not resuscitate)    Discharge Instructions   Allergies as of 11/27/2019   No Known Allergies     Medication List    STOP taking these medications   oxyCODONE 5 MG immediate release tablet Commonly known as: Oxy IR/ROXICODONE     TAKE these medications   acetaminophen 325 MG tablet Commonly known as: TYLENOL Take 2 tablets (650 mg total) by mouth every 6 (six) hours as needed for mild pain or moderate pain.   cinacalcet 60 MG tablet Commonly known as: SENSIPAR Take 60 mg by mouth daily.   dexamethasone 4 MG tablet Commonly known as: DECADRON Take 1 tablet (4 mg total) by mouth every 8 (eight) hours.   Fluticasone-Salmeterol 100-50 MCG/DOSE Aepb Commonly known as: ADVAIR Inhale 1 puff into the lungs in the morning and at bedtime.   levETIRAcetam 500 MG tablet Commonly known as: KEPPRA Take 1 tablet (500 mg total) by mouth 2 (two) times daily.    lidocaine-prilocaine cream Commonly known as: EMLA Apply 1 application topically every Monday, Wednesday, and Friday with hemodialysis.   metoprolol succinate 25 MG 24 hr tablet Commonly known as: TOPROL-XL Take 25 mg by mouth daily.   omeprazole 20 MG capsule Commonly known as: PRILOSEC Take 20 mg by mouth daily.   Sennosides-Docusate Sodium 8.6-50 MG Caps Take 1 capsule by mouth in the morning and at bedtime.   sevelamer carbonate 800 MG tablet Commonly known as: RENVELA Take 800 mg by mouth 3 (three) times daily with meals.   Ventolin HFA 108 (90 Base) MCG/ACT inhaler Generic drug: albuterol Inhale 2 puffs into the lungs every 4 (four) hours as needed for wheezing or shortness of breath.       No Known Allergies  Consultations:  Neurology   Oncology  Neurosurgery   Palliative Care   Procedures/Studies: CT HEAD WO CONTRAST  Result Date: 11/21/2019 CLINICAL DATA:  Numbness in jerking of the left arm and leg since Friday. History of renal and lung cancer. EXAM: CT HEAD WITHOUT CONTRAST TECHNIQUE: Contiguous axial images were obtained from the base of the skull through the vertex without intravenous contrast. COMPARISON:  None. FINDINGS: Brain: Vasogenic edema appearance in the left anterior frontal and right frontal parietal areas that is moderately extensive. There is a superimposed 1 cm high-density nodule in the left frontal lobe at the level of edema. No reported history of clinical infection. No evidence of infarct, hematoma, hydrocephalus, or collection. Generalized brain atrophy. Vascular: No hyperdense vessel or unexpected calcification. Skull:  Negative Sinuses/Orbits: Negative IMPRESSION: Bilateral cerebral cytotoxic edema, history and multifocality favoring metastatic disease to the brain. Electronically Signed   By: Monte Fantasia M.D.   On: 11/21/2019 04:34   MR BRAIN WO CONTRAST  Result Date: 11/21/2019 CLINICAL DATA:  Small cell lung cancer.  Abnormal  head CT EXAM: MRI HEAD WITHOUT CONTRAST TECHNIQUE: Multiplanar, multiecho pulse sequences of the brain and surrounding structures were obtained without intravenous contrast. COMPARISON:  Head CT from earlier today FINDINGS: Brain: T2 isointense anterior left frontal mass measuring 7 mm, with moderate vasogenic edema. The lesion has a ring like diffusion hyperintense appearance. There is active metastatic disease by May 2021 PET CT. High and anterior right parietal mass measuring 11 mm with extensive vasogenic edema. T2 and diffusion hyperintense lesion in the superior right cerebellum measuring 4 mm. No acute hemorrhage, hydrocephalus, collection, or acute infarct. Small remote right cerebellar infarction. Chronic small vessel ischemia in the cerebral white matter. Brain atrophy with ventriculomegaly. Vascular: Normal flow voids Skull and upper cervical spine: No focal marrow lesion is seen. Sinuses/Orbits: Negative Other: Motion degraded brain MRI. Noncontrast study.  There is chart history of dialysis. IMPRESSION: Metastatic disease to the brain with left frontal and right parietal lesions measuring up to 11 mm and associated with moderate to extensive vasogenic edema. Probable third lesion in the superior right cerebellum measuring 4 mm. Electronically Signed   By: Monte Fantasia M.D.   On: 11/21/2019 08:03   EEG adult  Result Date: 11/24/2019 Lora Havens, MD     11/24/2019  8:21 PM Patient Name: [name] MRN: [id number] Epilepsy Attending: Lora Havens Referring Physician/Provider: Dr Amie [place] Date: 11/24/2019 Duration: 25.52 mins Patient history: 86 year old male with a history of renal and lung cancer, presenting with seizure like activity. MRI brain reveals bilateral cerebral metastases. EEG to evaluate for seizure Level of alertness: Awake, asleep AEDs during EEG study: LEV Technical aspects: This EEG study was done with scalp electrodes positioned according to the 10-20 International  system of electrode placement. Electrical activity was acquired at a sampling rate of 500Hz  and reviewed with a high frequency filter of 70Hz  and a low frequency filter of 1Hz . EEG data were recorded continuously and digitally stored. Description: The posterior dominant rhythm consists of 9 Hz activity of moderate voltage (25-35 uV) seen predominantly in posterior head regions, symmetric and reactive to eye opening and eye closing. Sleep was characterized by vertex waves, sleep spindles (12 to 14 Hz), maximal frontocentral region. Hyperventilation and photic stimulation were not performed.   IMPRESSION: This study is within normal limits. No seizures or epileptiform discharges were seen throughout the recording. [place]   DG HIP UNILAT WITH PELVIS 2-3 VIEWS RIGHT  Result Date: 11/13/2019 DG HIP UNILAT W OR W/O PELVIS 2-3 VIEWS RIGHT Ap pelvis and ap lat right hip for fracture gr trochanter The fracture looks healed no displacement Prosthesis is stable        Subjective: Patient is feeling well, no further seizures. Denies any headache, no nausea or vomiting.   Discharge Exam: Vitals:   11/27/19 0340 11/27/19 0804  BP: (!) 132/59 139/61  Pulse: 61 60  Resp: 18 16  Temp: 98.2 F (36.8 C) 97.8 F (36.6 C)  SpO2: 99% 99%   Vitals:   11/26/19 2051 11/26/19 2343 11/27/19 0340 11/27/19 0804  BP:  (!) 115/58 (!) 132/59 139/61  Pulse:  64 61 60  Resp:  17 18 16   Temp:  98.5 F (36.9 C) 98.2 F (36.8 C) 97.8 F (36.6 C)  TempSrc:  Oral Oral Oral  SpO2: 98% 98% 99% 99%  Weight:      Height:        General: Not in pain or dyspnea.  Neurology: Awake and alert, non focal  E ENT: no pallor, no icterus, oral mucosa moist Cardiovascular: No JVD. S1-S2 present, rhythmic, no gallops, rubs, or murmurs. No lower extremity edema. Pulmonary: positive breath sounds bilaterally, adequate air movement, no wheezing, rhonchi or rales. Gastrointestinal. Abdomen soft and non tender. Skin. No  rashes Musculoskeletal: no joint deformities   The results of significant diagnostics from this hospitalization (including imaging, microbiology, ancillary and laboratory) are listed below for reference.     Microbiology: Recent Results (from the past 240 hour(s))  SARS Coronavirus 2 by RT PCR (hospital order, performed in [facility] hospital lab) Nasopharyngeal Nasopharyngeal Swab     Status: None   Collection Time: 11/21/19  6:21 AM   Specimen: Nasopharyngeal Swab  Result Value Ref Range Status   SARS Coronavirus 2 NEGATIVE NEGATIVE Final    Comment: (NOTE) SARS-CoV-2 target nucleic acids are NOT DETECTED.  The SARS-CoV-2 RNA is generally detectable in upper and lower respiratory specimens during the acute phase of infection. The lowest concentration of SARS-CoV-2 viral copies this assay can detect is 250 copies / mL. A negative result does not preclude SARS-CoV-2 infection and should not be used as the sole basis for treatment or other patient management decisions.  A negative result may occur with improper specimen collection / handling, submission of specimen other than nasopharyngeal swab, presence of viral mutation(s) within the areas targeted by this assay, and inadequate number of viral copies (<250 copies / mL). A negative result must be combined with clinical observations, patient history, and epidemiological information.  Fact Sheet for Patients:   StrictlyIdeas.no  Fact Sheet for Healthcare Providers: BankingDealers.co.za  This test is not yet approved or  cleared by the [place] FDA and has been authorized for detection and/or diagnosis of SARS-CoV-2 by FDA under an Emergency Use Authorization (EUA).  This EUA will remain in effect (meaning this test can be used) for the duration of the COVID-19 declaration under Section 564(b)(1) of the Act, 21 U.S.C. section 360bbb-3(b)(1), unless the authorization is  terminated or revoked sooner.  Performed at [facility], [address], [place], [place] [address]      Labs: BNP (last 3 results) No results for input(s): BNP in the last 8760 hours. Basic Metabolic Panel: Recent Labs  Lab 11/21/19 0343 11/21/19 0343 11/21/19 0348 11/22/19 0205 11/23/19 0951 11/24/19 0628 11/26/19 0820  NA 136   < > 137 132* 136 136 135  K 4.7   < > 4.6 5.8* 4.2 4.8 3.8  CL 98   < > 97* 98 98 97* 99  CO2 24  --   --  22 26 24 24   GLUCOSE 123*   < > 121* 232* 271* 140* 132*  BUN 48*   < > 42* 63* 38* 61* 48*  CREATININE 9.22*   < > 9.90* 10.86* 6.52* 8.29* 5.44*  CALCIUM 7.5*  --   --  7.4* 7.0* 7.5* 7.9*  MG  --   --   --  2.0  --   --   --   PHOS  --   --   --   --   --   --  3.1   < > = values in  this interval not displayed.   Liver Function Tests: Recent Labs  Lab 11/21/19 0343 11/26/19 0820  AST 12*  --   ALT 13  --   ALKPHOS 150*  --   BILITOT 0.6  --   PROT 6.4*  --   ALBUMIN 3.1* 2.6*   No results for input(s): LIPASE, AMYLASE in the last 168 hours. No results for input(s): AMMONIA in the last 168 hours. CBC: Recent Labs  Lab 11/21/19 0343 11/21/19 0348 11/22/19 0205 11/26/19 0814  WBC 5.1  --  6.2 9.8  NEUTROABS 3.7  --   --   --   HGB 9.3* 9.5* 9.2* 10.3*  HCT 29.2* 28.0* 29.0* 31.6*  MCV 101.0*  --  101.0* 98.8  PLT 151  --  153 191   Cardiac Enzymes: No results for input(s): CKTOTAL, CKMB, CKMBINDEX, TROPONINI in the last 168 hours. BNP: Invalid input(s): POCBNP CBG: Recent Labs  Lab 11/26/19 1142 11/26/19 1741 11/26/19 2129 11/26/19 2155 11/27/19 0632  GLUCAP 143* 242* 228* 234* 159*   D-Dimer No results for input(s): DDIMER in the last 72 hours. Hgb A1c No results for input(s): HGBA1C in the last 72 hours. Lipid Profile No results for input(s): CHOL, HDL, LDLCALC, TRIG, CHOLHDL, LDLDIRECT in the last 72 hours. Thyroid function studies No results for input(s): TSH, T4TOTAL, T3FREE, THYROIDAB in the last 72  hours.  Invalid input(s): FREET3 Anemia work up No results for input(s): VITAMINB12, FOLATE, FERRITIN, TIBC, IRON, RETICCTPCT in the last 72 hours. Urinalysis    Component Value Date/Time   COLORURINE YELLOW 01/01/2009 1054   APPEARANCEUR CLEAR 01/01/2009 1054   LABSPEC 1.018 01/01/2009 1054   PHURINE 5.0 01/01/2009 1054   GLUCOSEU NEGATIVE 01/01/2009 1054   HGBUR NEGATIVE 01/01/2009 1054   BILIRUBINUR NEGATIVE 01/01/2009 1054   [place] 01/01/2009 1054   PROTEINUR 100 (A) 01/01/2009 1054   UROBILINOGEN 0.2 01/01/2009 1054   NITRITE NEGATIVE 01/01/2009 1054   LEUKOCYTESUR NEGATIVE 01/01/2009 1054   Sepsis Labs Invalid input(s): PROCALCITONIN,  WBC,  LACTICIDVEN Microbiology Recent Results (from the past 240 hour(s))  SARS Coronavirus 2 by RT PCR (hospital order, performed in [facility] hospital lab) Nasopharyngeal Nasopharyngeal Swab     Status: None   Collection Time: 11/21/19  6:21 AM   Specimen: Nasopharyngeal Swab  Result Value Ref Range Status   SARS Coronavirus 2 NEGATIVE NEGATIVE Final    Comment: (NOTE) SARS-CoV-2 target nucleic acids are NOT DETECTED.  The SARS-CoV-2 RNA is generally detectable in upper and lower respiratory specimens during the acute phase of infection. The lowest concentration of SARS-CoV-2 viral copies this assay can detect is 250 copies / mL. A negative result does not preclude SARS-CoV-2 infection and should not be used as the sole basis for treatment or other patient management decisions.  A negative result may occur with improper specimen collection / handling, submission of specimen other than nasopharyngeal swab, presence of viral mutation(s) within the areas targeted by this assay, and inadequate number of viral copies (<250 copies / mL). A negative result must be combined with clinical observations, patient history, and epidemiological information.  Fact Sheet for Patients:    StrictlyIdeas.no  Fact Sheet for Healthcare Providers: BankingDealers.co.za  This test is not yet approved or  cleared by the [place] FDA and has been authorized for detection and/or diagnosis of SARS-CoV-2 by FDA under an Emergency Use Authorization (EUA).  This EUA will remain in effect (meaning this test can be used) for the duration of  the COVID-19 declaration under Section 564(b)(1) of the Act, 21 U.S.C. section 360bbb-3(b)(1), unless the authorization is terminated or revoked sooner.  Performed at [facility], [address] [place], [place] [address]      Time coordinating discharge: 45 minutes  SIGNED:   Tawni Millers, MD  [facility] 11/27/2019, 11:22 AM

## 2019-11-27 NOTE — TOC Initial Note (Signed)
Transition of Care [facility]) - Initial/Assessment Note    Patient Details  Name: [name] MRN: [id number] Date of Birth: [birth year]  Transition of Care [facility]) CM/SW Contact:    Bethena Roys, RN Phone Number: 11/27/2019, 1:13 PM  Clinical Narrative:  High risk for readmission assessment completed. Prior to arrival patient was from home- he had just recently been at a SNF at [place]. The facility had set the patient up with [facility] home health. Case Manager called [facility] to confirm services and they are aware that patient may transition home after MRI. Per patient and family he will have support in the home from the niece. Patient has rolling walker and wheelchair in the home. No durable medical equipment needs at this time. Case Manager will continue to follow for additional transition of care needs.                   Expected Discharge Plan: [place] Barriers to Discharge: No Barriers Identified   Patient Goals and CMS Choice Patient states their goals for this hospitalization and ongoing recovery are:: to return home CMS Medicare.gov Compare Post Acute Care list provided to:: Patient Choice offered to / list presented to : Patient  Expected Discharge Plan and Services Expected Discharge Plan: [place] In-house Referral: NA Discharge Planning Services: CM Consult Post Acute Care Choice: [place] arrangements for the past 2 months: Single Family Home Expected Discharge Date: 11/27/19               DME Arranged: N/A DME Agency: NA       HH Arranged: RN, Disease Management, PT, OT, Nurse's Aide HH Agency: [facility] Date HH Agency Contacted: 11/27/19 Time HH Agency Contacted: 1311 Representative spoke with at [place]: Malachy Mood  Prior Living Arrangements/Services Living arrangements for the past 2 months: [place] with:: Self (has family support that will be in the  home.) Patient language and need for interpreter reviewed:: Yes Do you feel safe going back to the place where you live?: Yes      Need for Family Participation in Patient Care: Yes (Comment) Care giver support system in place?: Yes (comment) Current home services: DME (patient has a rolling walker and wheelchair in the home.) Criminal Activity/Legal Involvement Pertinent to Current Situation/Hospitalization: No - Comment as needed  Activities of Daily Living Home Assistive Devices/Equipment: Built-in shower seat, Raised toilet seat with rails, Wheelchair ADL Screening (condition at time of admission) Patient's cognitive ability adequate to safely complete daily activities?: Yes Is the patient deaf or have difficulty hearing?: No Does the patient have difficulty seeing, even when wearing glasses/contacts?: No Does the patient have difficulty concentrating, remembering, or making decisions?: No Patient able to express need for assistance with ADLs?: Yes Does the patient have difficulty dressing or bathing?: Yes Independently performs ADLs?: No Communication: Needs assistance Is this a change from baseline?: Change from baseline, expected to last >3 days Dressing (OT): Needs assistance Is this a change from baseline?: Change from baseline, expected to last >3 days Grooming: Needs assistance Is this a change from baseline?: Change from baseline, expected to last >3 days Feeding: Independent Bathing: Needs assistance Is this a change from baseline?: Change from baseline, expected to last >3 days Toileting: Needs assistance Is this a change from baseline?: Change from baseline, expected to last >3days In/Out Bed: Needs assistance Is this a change from baseline?: Change from baseline, expected to last >  3 days Walks in Home: Needs assistance Is this a change from baseline?: Change from baseline, expected to last >3 days Does the patient have difficulty walking or climbing stairs?:  Yes Weakness of Legs: Left Weakness of Arms/Hands: Left  Permission Sought/Granted Permission sought to share information with : Family Supports, Customer service manager, Case Optician, dispensing granted to share information with : Yes, Verbal Permission Granted     Permission granted to share info w AGENCY: [facility]        Emotional Assessment Appearance:: Appears stated age Attitude/Demeanor/Rapport: Engaged Affect (typically observed): Appropriate Orientation: : Oriented to Situation, Oriented to  Time, Oriented to Place, Oriented to Self Alcohol / Substance Use: Not Applicable Psych Involvement: No (comment)  Admission diagnosis:  Paresthesia [R20.2] Seizure ([place]) [R56.9] Metastasis to brain (HCC) [C79.31] Left sided numbness [R20.0] Patient Active Problem List   Diagnosis Date Noted  . 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 Renal cell carcinoma ([place]) 11/23/2019  . Palliative care by specialist   . Goals of care, counseling/discussion   . DNR (do not resuscitate)   . Metastasis to brain ([place]) 11/21/2019  . Seizure ([place]) 11/21/2019  . Renal cancer ([place]) 10/04/2019  . Periprosthetic fracture around internal prosthetic right hip joint ([place]) 10/04/2019 10/04/2019  . Community acquired pneumonia 05/30/2017  . Influenza A 05/30/2017  . BPH (benign prostatic hypertrophy) 05/30/2017  . COPD (chronic obstructive pulmonary disease) ([place]) 05/30/2017  . Hypertension 05/30/2017  . GERD (gastroesophageal reflux disease) 05/30/2017  . Pulmonary nodules/lesions, multiple 09/14/2016  . Bowel habit changes 09/11/2015  . Malignant neoplasm of right upper lobe of lung ([place]) 10/27/2012  . Visit for wound check 10/09/2012  . Swollen arm 09/15/2012  . Encounter for adequacy testing for hemodialysis ([place]) 09/01/2012  . Aftercare following surgery of the circulatory system, [place] 08/18/2012  . Other complications due to renal dialysis device, implant, and graft 04/19/2012  .  End stage renal disease ([place]) 07/14/2011  . Pre-operative cardiovascular examination 04/07/2011   PCP:  Sharilyn Sites, MD Pharmacy:   [place], [place] - [address] [place] [address] Phone: [phone] Fax: [place], [place] - [address] [place] [address] Phone: [phone] Fax: [phone]   Readmission Risk Interventions Readmission Risk Prevention Plan 11/27/2019  Transportation Screening Complete  HRI or Home Care Consult Complete  Social Work Consult for [place] Planning/Counseling Complete  Palliative Care Screening Not Applicable  Medication Review Press photographer) Complete  Some recent data might be hidden

## 2019-11-27 NOTE — Progress Notes (Signed)
Renal Navigator faxed Discharge Summary and Renal Note to patient's OP HD clinic/[facility] to provide continuity of care.  Alphonzo Cruise, [place] Renal Navigator [phone]

## 2019-11-27 NOTE — Progress Notes (Signed)
Admit: 11/21/2019 LOS: 6  70M ESRD MWF [facility] with Seizures and new brain mets, hx/o RCC   Subjective:  Tolerated HD treatment yesterday (see below). Due for MRI with 2nd gen contrast today. Discharge post MRI. Denies chest pain, SOB, issues with avf.  Objective  07/19 0701 - 07/20 0700 In: -  Out: 2500   Filed Weights   11/24/19 1143 11/26/19 0700 11/26/19 1115  Weight: 80.5 kg 84.3 kg 80.6 kg    Scheduled Meds: . Chlorhexidine Gluconate Cloth  6 each Topical Q0600  . Chlorhexidine Gluconate Cloth  6 each Topical Q0600  . dexamethasone  4 mg Oral Q8H  . insulin aspart  0-5 Units Subcutaneous QHS  . insulin aspart  0-9 Units Subcutaneous TID WC  . levETIRAcetam  500 mg Oral BID  . lidocaine-prilocaine  1 application Topical Q M,W,F-HD  . metoprolol succinate  25 mg Oral Daily  . mometasone-formoterol  2 puff Inhalation BID  . pantoprazole  40 mg Oral Daily  . sevelamer carbonate  800 mg Oral TID WC  . sodium chloride flush  3 mL Intravenous Q12H   Continuous Infusions: . sodium chloride Stopped (11/24/19 1336)   PRN Meds:.sodium chloride, acetaminophen **OR** acetaminophen, albuterol, ipratropium-albuterol, LORazepam, ondansetron **OR** ondansetron (ZOFRAN) IV, oxyCODONE, sodium chloride flush  Current Labs: reviewed  Recent Labs  Lab 11/23/19 0951 11/24/19 0628 11/26/19 0820  NA 136 136 135  K 4.2 4.8 3.8  CL 98 97* 99  CO2 26 24 24   GLUCOSE 271* 140* 132*  BUN 38* 61* 48*  CREATININE 6.52* 8.29* 5.44*  CALCIUM 7.0* 7.5* 7.9*  PHOS  --   --  3.1   Recent Labs  Lab 11/21/19 0343 11/21/19 0343 11/21/19 0348 11/22/19 0205 11/26/19 0814  WBC 5.1  --   --  6.2 9.8  NEUTROABS 3.7  --   --   --   --   HGB 9.3*   < > 9.5* 9.2* 10.3*  HCT 29.2*   < > 28.0* 29.0* 31.6*  MCV 101.0*  --   --  101.0* 98.8  PLT 151  --   --  153 191   < > = values in this interval not displayed.      Physical Exam:  Blood pressure 139/61, pulse 60, temperature 97.8  F (36.6 C), temperature source Oral, resp. rate 16, height 5\' 6"  (1.676 m), weight 80.6 kg, SpO2 99 %. NAD, conversant, comfortable RRR  CTAB LUE AVF +B/T, bandaged No sig [name] Neuro: speech clear and coherent, moves all extremities spontaneously, hard of hearing  A 1. ESRD MWF [facility] LUE AVF 2. Metastatic RCC, new brain metastases, per neuro/oncology; possibly for palliative XRT 3. Seizures, neuro; Keppra 4. CKD-BMD, cont binders 5. Anemia, hold ESA, Hb 9s 6. Hyperkalemia, resolved  P . Tolerated HD yesterday with net UF 2.5L, 3.5hr treatment . Will run on dialysis again on Wed (if still here), okay from nephrology perspective for discharge after MRI . Medication Issues; o Preferred narcotic agents for pain control are hydromorphone, fentanyl, and methadone. Morphine should not be used.  o Baclofen should be avoided o Avoid oral sodium phosphate and magnesium citrate based laxatives / bowel preps     Gean Quint, MD [place]

## 2019-11-27 NOTE — Progress Notes (Signed)
Physical Therapy Treatment Patient Details Name: [name] MRN: [id number] DOB: 04/24/1931 Today's Date: 11/27/2019    History of Present Illness 86 y.o. male with a PMHx of bronchoalveolar carcinoma, renal carcinoma s/p left nephrectomy, ESRD on HD MWF, CIDP, COPD, HTN, recent right greater trochanteric fracture, who [facility] to [facility] for further assessment of worsening weakness as well as left-sided upper and lower extremity numbness and tingling that began Friday of last week. He states that on Friday, he also had "3 or 4 seizures" that involved jerking of his left side. He was discharged home from his SNF, where he was residing while recovering from the right hip fracture. This admission beginning 7/14, pt believed to have had small seizures secondary to brain mets (L frontal, R parietal, R cerebellar) due to metastatic renal cell carcinoma. Other areas of mets include R iliac, RUL.    PT Comments    Pt progressing towards physical therapy goals. Was able to perform transfers with up to modified independence, and ambulation with occasional min guard for safety. Pt dyspneic during gait training however it appears he is improving overall with general functional mobility. Pt and family anticipate d/c home today.   Follow Up Recommendations  Home health PT;Supervision/Assistance - 24 hour     Equipment Recommendations  None recommended by PT    Recommendations for Other Services       Precautions / Restrictions Precautions Precautions: Fall Precaution Comments: hx of seizures Restrictions Weight Bearing Restrictions: No    Mobility  Bed Mobility Overal bed mobility: Modified Independent Bed Mobility: Sit to Supine           General bed mobility comments: HOB flat to simulate home environment.   Transfers Overall transfer level: Needs assistance Equipment used: Rolling walker (2 wheeled);None Transfers: Sit to/from Stand Sit to Stand: Supervision          General transfer comment: No assist required. Pt was cued for hand placement on seated surface for safety.   Ambulation/Gait Ambulation/Gait assistance: Supervision;Min guard Gait Distance (Feet): 100 Feet (50' x2) Assistive device: Rolling walker (2 wheeled) Gait Pattern/deviations: Step-to pattern;Decreased step length - right;Decreased weight shift to right;Antalgic;Trunk flexed Gait velocity: Decreased Gait velocity interpretation: <1.31 ft/sec, indicative of household ambulator General Gait Details: Min guard progressing to supervision. Pt dyspneic almost immediately and was cued for pursed-lip breathing. Seated rest break after first walk, able to ambulate again after ~5 minute rest break.    Stairs Stairs:  (Deferred - has ramp)           Wheelchair Mobility    Modified Rankin (Stroke Patients Only)       Balance Overall balance assessment: Needs assistance Sitting-balance support: No upper extremity supported;Feet supported Sitting balance-Leahy Scale: Good     Standing balance support: During functional activity;Single extremity supported Standing balance-Leahy Scale: Poor                              Cognition Arousal/Alertness: Awake/alert Behavior During Therapy: WFL for tasks assessed/performed Overall Cognitive Status: Within Functional Limits for tasks assessed                                        Exercises      General Comments        Pertinent Vitals/Pain Pain Assessment: No/denies pain  Home Living                      Prior Function            PT Goals (current goals can now be found in the care plan section) Acute Rehab PT Goals Patient Stated Goal: Home today PT Goal Formulation: With patient Time For Goal Achievement: 12/07/19 Potential to Achieve Goals: Good Progress towards PT goals: Progressing toward goals    Frequency    Min 3X/week      PT Plan Current plan remains  appropriate    Co-evaluation              AM-PAC PT "6 Clicks" Mobility   Outcome Measure  Help needed turning from your back to your side while in a flat bed without using bedrails?: A Little Help needed moving from lying on your back to sitting on the side of a flat bed without using bedrails?: A Little Help needed moving to and from a bed to a chair (including a wheelchair)?: A Little Help needed standing up from a chair using your arms (e.g., wheelchair or bedside chair)?: A Little Help needed to walk in hospital room?: A Little Help needed climbing 3-5 steps with a railing? : A Little 6 Click Score: 18    End of Session Equipment Utilized During Treatment: Gait belt Activity Tolerance: Patient tolerated treatment well;Patient limited by fatigue Patient left: in chair;with call bell/phone within reach;with chair alarm set;with family/visitor present Nurse Communication: Mobility status PT Visit Diagnosis: Other abnormalities of gait and mobility (R26.89);Muscle weakness (generalized) (M62.81);Difficulty in walking, not elsewhere classified (R26.2)     Time: 1410-1430 PT Time Calculation (min) (ACUTE ONLY): 20 min  Charges:  $Gait Training: 8-22 mins                     Rolinda Roan, PT, DPT Acute Rehabilitation Services Pager: [phone] Office: [phone]    Thelma Comp 11/27/2019, 3:09 PM

## 2019-11-27 NOTE — Progress Notes (Signed)
Occupational Therapy Treatment Patient Details Name: [name] MRN: [id number] DOB: Jul 19, 1930 Today's Date: 11/27/2019    History of present illness 86 y.o. male with a PMHx of bronchoalveolar carcinoma, renal carcinoma s/p left nephrectomy, ESRD on HD MWF, CIDP, COPD, HTN, recent right greater trochanteric fracture, who [facility] to [facility] for further assessment of worsening weakness as well as left-sided upper and lower extremity numbness and tingling that began Friday of last week. He states that on Friday, he also had "3 or 4 seizures" that involved jerking of his left side. He was discharged home from his SNF, where he was residing while recovering from the right hip fracture. This admission beginning 7/14, pt believed to have had small seizures secondary to brain mets (L frontal, R parietal, R cerebellar) due to metastatic renal cell carcinoma. Other areas of mets include R iliac, RUL.   OT comments  Pt making steady progress towards OT goals this session. Pt continues to present with decreased activity tolerance, decreased functional endurance and dyspnea with exertion impacting pts ability to complete BADLs. Overall, pt requires MIN A for functional mobility with RW as pt presents with impaired balance and functional mobility. Pt confirms 24/7 assist at home. Pt would continue to benefit from skilled occupational therapy while admitted and after d/c to address the below listed limitations in order to improve overall functional mobility and facilitate independence with BADL participation. DC plan remains appropriate, will follow acutely per POC.     Follow Up Recommendations  Home health OT;SNF    Equipment Recommendations  3 in 1 bedside commode;Other (comment) (unsure if he has a BSC)    Recommendations for Other Services      Precautions / Restrictions Precautions Precautions: Fall Precaution Comments: hx of seizures Restrictions Weight Bearing Restrictions: No        Mobility Bed Mobility Overal bed mobility: Needs Assistance Bed Mobility: Supine to Sit     Supine to sit: Supervision;HOB elevated     General bed mobility comments: supervision for safety, pt with use fo bed rails and extra time,  Transfers Overall transfer level: Needs assistance Equipment used: Rolling walker (2 wheeled);None Transfers: Sit to/from Stand Sit to Stand: Min guard         General transfer comment: min guard for safety. pt able to sit<>stand from EOB and toilet seat. pt able to use grab bars and minguard to power up from low toilet seat. pt reports 3n1 over toilet at home    Balance Overall balance assessment: Needs assistance   Sitting balance-Leahy Scale: Good     Standing balance support: During functional activity;Single extremity supported Standing balance-Leahy Scale: Poor Standing balance comment: at least one UE supported during standing grooming tasks at sink                           ADL either performed or assessed with clinical judgement   ADL Overall ADL's : Needs assistance/impaired     Grooming: Wash/dry hands;Standing;Min guard Grooming Details (indicate cue type and reason): min guard for balance at sink with RW                 Toilet Transfer: Minimal assistance;RW;Grab bars;Ambulation Toilet Transfer Details (indicate cue type and reason): pt able to ambulate to BR from EOB with RW and MIN A for balance and RW mgmt. Toileting- Water quality scientist and Hygiene: Min guard;Sit to/from stand Toileting - Water quality scientist Details (indicate cue  type and reason): min guard for posterior pericare   Tub/Shower Transfer Details (indicate cue type and reason): pt reports tun shower at home; education on using TTB with pt preferring to do "bird bath" at sink Functional mobility during ADLs: Minimal assistance;Rolling walker General ADL Comments: pt presents with decreased activity tolerance, decreased endurance  and dyspnea with exertion     Vision       Perception     Praxis      Cognition Arousal/Alertness: Awake/alert Behavior During Therapy: WFL for tasks assessed/performed Overall Cognitive Status: Within Functional Limits for tasks assessed                                          Exercises     Shoulder Instructions       General Comments pt with family member present during session ( unclear of relation). pt reports 3n1 at home and confirms 24/7 assist at home. pt reports he never gets up in the middle of the night to urinate but encouraged BSC at bedside to decrease risk of falls. Pt reports no shower seat for tub but states he would prefer wash ups at sink. education provided on using TTB if pt changes his mind. pt on RA throghout session with pt noticeably SOB after ambulating from EOB>BR>recliner however unable to obtain accurate O2 reading    Pertinent Vitals/ Pain       Pain Assessment: No/denies pain  Home Living                                          Prior Functioning/Environment              Frequency  Min 2X/week        Progress Toward Goals  OT Goals(current goals can now be found in the care plan section)  Progress towards OT goals: Progressing toward goals  Acute Rehab OT Goals Patient Stated Goal: To be strong enough to go home OT Goal Formulation: With patient Time For Goal Achievement: 12/07/19 Potential to Achieve Goals: Good  Plan Discharge plan remains appropriate;Frequency remains appropriate    Co-evaluation                 AM-PAC OT "6 Clicks" Daily Activity     Outcome Measure   Help from another person eating meals?: None Help from another person taking care of personal grooming?: A Little Help from another person toileting, which includes using toliet, bedpan, or urinal?: A Little Help from another person bathing (including washing, rinsing, drying)?: A Little Help from another  person to put on and taking off regular upper body clothing?: A Little Help from another person to put on and taking off regular lower body clothing?: A Little 6 Click Score: 19    End of Session Equipment Utilized During Treatment: Rolling walker  OT Visit Diagnosis: Unsteadiness on feet (R26.81);Other abnormalities of gait and mobility (R26.89);Repeated falls (R29.6);Muscle weakness (generalized) (M62.81)   Activity Tolerance Patient tolerated treatment well   Patient Left in chair;with call bell/phone within reach;with family/visitor present   Nurse Communication Mobility status        Time: 1062-6948 OT Time Calculation (min): 23 min  Charges: OT General Charges $OT Visit: 1 Visit OT Treatments $Self Care/Home Management : 23-37 mins  Lanier Clam., COTA/L Acute Rehabilitation Services [phone] [place] 11/27/2019, 8:59 AM

## 2019-11-28 ENCOUNTER — Other Ambulatory Visit: Payer: Self-pay | Admitting: Radiation Therapy

## 2019-11-28 DIAGNOSIS — N2581 Secondary hyperparathyroidism of renal origin: Secondary | ICD-10-CM | POA: Diagnosis not present

## 2019-11-28 DIAGNOSIS — N186 End stage renal disease: Secondary | ICD-10-CM | POA: Diagnosis not present

## 2019-11-28 DIAGNOSIS — D509 Iron deficiency anemia, unspecified: Secondary | ICD-10-CM | POA: Diagnosis not present

## 2019-11-28 DIAGNOSIS — D631 Anemia in chronic kidney disease: Secondary | ICD-10-CM | POA: Diagnosis not present

## 2019-11-28 DIAGNOSIS — Z992 Dependence on renal dialysis: Secondary | ICD-10-CM | POA: Diagnosis not present

## 2019-11-28 LAB — GLUCOSE, CAPILLARY: Glucose-Capillary: 156 mg/dL — ABNORMAL HIGH (ref 70–99)

## 2019-11-28 NOTE — Progress Notes (Signed)
Admit: 11/21/2019 LOS: 7  39M ESRD MWF [facility] with Seizures and new brain mets, hx/o RCC   Subjective:  Received MRI late last night therefore not discharged.  Offers no new complaints.  Okay for discharge today however also plan for dialysis today (see below).  Objective  07/20 0701 - 07/21 0700 In: 462 [P.O.:462] Out: -   Filed Weights   11/24/19 1143 11/26/19 0700 11/26/19 1115  Weight: 80.5 kg 84.3 kg 80.6 kg    Scheduled Meds: . Chlorhexidine Gluconate Cloth  6 each Topical Q0600  . Chlorhexidine Gluconate Cloth  6 each Topical Q0600  . dexamethasone  4 mg Oral Q8H  . insulin aspart  0-5 Units Subcutaneous QHS  . insulin aspart  0-9 Units Subcutaneous TID WC  . levETIRAcetam  500 mg Oral BID  . lidocaine-prilocaine  1 application Topical Q M,W,F-HD  . metoprolol succinate  25 mg Oral Daily  . mometasone-formoterol  2 puff Inhalation BID  . pantoprazole  40 mg Oral Daily  . sevelamer carbonate  800 mg Oral TID WC  . sodium chloride flush  3 mL Intravenous Q12H   Continuous Infusions: . sodium chloride Stopped (11/24/19 1336)   PRN Meds:.sodium chloride, acetaminophen **OR** acetaminophen, albuterol, ipratropium-albuterol, LORazepam, ondansetron **OR** ondansetron (ZOFRAN) IV, oxyCODONE, sodium chloride flush  Current Labs: reviewed  Recent Labs  Lab 11/23/19 0951 11/24/19 0628 11/26/19 0820  NA 136 136 135  K 4.2 4.8 3.8  CL 98 97* 99  CO2 26 24 24   GLUCOSE 271* 140* 132*  BUN 38* 61* 48*  CREATININE 6.52* 8.29* 5.44*  CALCIUM 7.0* 7.5* 7.9*  PHOS  --   --  3.1   Recent Labs  Lab 11/21/19 0343 11/21/19 0343 11/21/19 0348 11/22/19 0205 11/26/19 0814  WBC 5.1  --   --  6.2 9.8  NEUTROABS 3.7  --   --   --   --   HGB 9.3*   < > 9.5* 9.2* 10.3*  HCT 29.2*   < > 28.0* 29.0* 31.6*  MCV 101.0*  --   --  101.0* 98.8  PLT 151  --   --  153 191   < > = values in this interval not displayed.      Physical Exam:  Blood pressure (!) 173/65,  pulse 63, temperature 98.2 F (36.8 C), temperature source Oral, resp. rate 20, height 5\' 6"  (1.676 m), weight 80.6 kg, SpO2 93 %. NAD, conversant, comfortable, laying in bed CV: s1s2, RRR  Resp: CTAB, normal wob, unlabored Ext: No sig edema bl le's, LUE AVF +B/T, bandaged Neuro: speech clear and coherent, moves all extremities spontaneously, hard of hearing  A 1. ESRD MWF [facility] LUE AVF 2. Metastatic RCC, new brain metastases, per neuro/oncology; possibly for palliative XRT 3. Seizures, neuro; Keppra 4. CKD-BMD, cont binders 5. Anemia, hold ESA, Hb 9s 6. Hyperkalemia, resolved  P . Open for dialysis today however we will try to get him to his outpatient dialysis clinic by 11 AM discussed with our renal navigator.  If that is not feasible will just run him on dialysis here prior to discharge.  Otherwise stable from a nephrology standpoint for discharge . Medication Issues; o Preferred narcotic agents for pain control are hydromorphone, fentanyl, and methadone. Morphine should not be used.  o Baclofen should be avoided o Avoid oral sodium phosphate and magnesium citrate based laxatives / bowel preps     Gean Quint, MD [place]

## 2019-11-28 NOTE — Progress Notes (Signed)
Patient's niece [name] educated on discharge instructions, belongings returned to patient. [name] providing transportation

## 2019-11-28 NOTE — Progress Notes (Signed)
Renal Navigator appreciates call from CSW/L. Tetlin who states patient is still here-did not discharge yesterday as planned. She has messaged MD to see about plan for today. Navigator spoke with Nephrologist who has cleared patient for OP HD today if arrangements can be made. Navigator spoke with OP HD clinic/[facility], who states they can accommodate him if he can be there by 11:00am. Navigator informed CSW and will follow for any needs/assistance. If patient cannot get to his clinic by 11:00am, he will need HD here, but we cannot guarantee that we can prioritize him as there are sick patients in need of HD who may need treatment before a discharging patient.  Alphonzo Cruise, [place] Renal Navigator [phone]

## 2019-11-28 NOTE — Care Management Important Message (Signed)
Important Message  Patient Details  Name: [name] MRN: [id number] Date of Birth: [birth year]   Medicare Important Message Given:  Yes     Orbie Pyo 11/28/2019, 12:26 PM

## 2019-11-28 NOTE — Progress Notes (Signed)
Renal Navigator received word from CSW that patient has been cleared by Primary. RN has been attempting to call family. Navigator was able to get in touch with patient's niece/[name] Delorse Lek, who states she was planning for patient to have HD here at the hospital today and then take him home. Navigator informed her that while we are able to do that, we are not able to give a time of when HD will occur since there are patients who are in greater need who need to have treatment first. She was completely understanding. Navigator explained that his clinic in [place] is able to accommodate him, but we are not sure if family would be able to get him there by 11:00am. [name] states she works at the hospital and can come get patient now to take him to his outpatient clinic. She states she would much rather do this than wait in the hospital for inpatient HD. Navigator thanked her and told her that patient's discharge papers will be ready when she arrives. RN was sitting with me as I spoke and agreed. Navigator greatly Transport planner of CSW and Therapist, sports.  Patient will not have inpatient HD prior to discharge and will be taken straight to his OP HD clinic by his niece and go home from there.   Alphonzo Cruise, [place] Renal Navigator [phone]

## 2019-11-29 ENCOUNTER — Ambulatory Visit
Admission: RE | Admit: 2019-11-29 | Discharge: 2019-11-29 | Disposition: A | Discharge status: Home / Self Care | Payer: Medicare Other | Source: Ambulatory Visit | Attending: Radiation Oncology | Admitting: Radiation Oncology

## 2019-11-29 ENCOUNTER — Other Ambulatory Visit: Payer: Self-pay

## 2019-11-29 ENCOUNTER — Encounter: Payer: Self-pay | Admitting: *Deleted

## 2019-11-29 ENCOUNTER — Telehealth: Payer: Self-pay | Admitting: Nurse Practitioner

## 2019-11-29 ENCOUNTER — Ambulatory Visit
Admit: 2019-11-29 | Discharge: 2019-11-29 | Disposition: A | Discharge status: Home / Self Care | Payer: Medicare Other | Attending: Radiation Oncology | Admitting: Radiation Oncology

## 2019-11-29 VITALS — BP 119/48 | HR 61 | Temp 98.7°F | Resp 20

## 2019-11-29 DIAGNOSIS — M19041 Primary osteoarthritis, right hand: Secondary | ICD-10-CM | POA: Diagnosis not present

## 2019-11-29 DIAGNOSIS — N186 End stage renal disease: Secondary | ICD-10-CM | POA: Diagnosis not present

## 2019-11-29 DIAGNOSIS — C7931 Secondary malignant neoplasm of brain: Secondary | ICD-10-CM | POA: Diagnosis not present

## 2019-11-29 DIAGNOSIS — J449 Chronic obstructive pulmonary disease, unspecified: Secondary | ICD-10-CM | POA: Diagnosis not present

## 2019-11-29 DIAGNOSIS — C3412 Malignant neoplasm of upper lobe, left bronchus or lung: Secondary | ICD-10-CM | POA: Diagnosis not present

## 2019-11-29 DIAGNOSIS — E1122 Type 2 diabetes mellitus with diabetic chronic kidney disease: Secondary | ICD-10-CM | POA: Diagnosis not present

## 2019-11-29 DIAGNOSIS — Z9181 History of falling: Secondary | ICD-10-CM | POA: Diagnosis not present

## 2019-11-29 DIAGNOSIS — G936 Cerebral edema: Secondary | ICD-10-CM | POA: Diagnosis not present

## 2019-11-29 DIAGNOSIS — Z87891 Personal history of nicotine dependence: Secondary | ICD-10-CM | POA: Diagnosis not present

## 2019-11-29 DIAGNOSIS — Z66 Do not resuscitate: Secondary | ICD-10-CM | POA: Diagnosis not present

## 2019-11-29 DIAGNOSIS — C7901 Secondary malignant neoplasm of right kidney and renal pelvis: Secondary | ICD-10-CM | POA: Diagnosis not present

## 2019-11-29 DIAGNOSIS — Z96659 Presence of unspecified artificial knee joint: Secondary | ICD-10-CM | POA: Diagnosis not present

## 2019-11-29 DIAGNOSIS — M9701XD Periprosthetic fracture around internal prosthetic right hip joint, subsequent encounter: Secondary | ICD-10-CM | POA: Diagnosis not present

## 2019-11-29 DIAGNOSIS — C649 Malignant neoplasm of unspecified kidney, except renal pelvis: Secondary | ICD-10-CM | POA: Diagnosis not present

## 2019-11-29 DIAGNOSIS — C3411 Malignant neoplasm of upper lobe, right bronchus or lung: Secondary | ICD-10-CM | POA: Diagnosis not present

## 2019-11-29 DIAGNOSIS — I120 Hypertensive chronic kidney disease with stage 5 chronic kidney disease or end stage renal disease: Secondary | ICD-10-CM | POA: Diagnosis not present

## 2019-11-29 DIAGNOSIS — K219 Gastro-esophageal reflux disease without esophagitis: Secondary | ICD-10-CM | POA: Diagnosis not present

## 2019-11-29 DIAGNOSIS — Z905 Acquired absence of kidney: Secondary | ICD-10-CM | POA: Diagnosis not present

## 2019-11-29 DIAGNOSIS — Z992 Dependence on renal dialysis: Secondary | ICD-10-CM | POA: Diagnosis not present

## 2019-11-29 NOTE — Progress Notes (Signed)
Has armband been applied?  Yes  Does patient have an allergy to IV contrast dye?: No   Has patient ever received premedication for IV contrast dye?: n/a  Does patient take metformin?: No  If patient does take metformin when was the last dose: n/a  Date of lab work: 11/24/2019 BUN: 61  CR: 8.29 Egfr: 5  IV site: Right AC  Has IV site been added to flowsheet?  Yes

## 2019-11-29 NOTE — Progress Notes (Signed)
Per Dr. Benay Spice : Needs OV week of 8/16 with BS or NP. Scheduling message sent.

## 2019-11-29 NOTE — Telephone Encounter (Signed)
Scheduled appointment per 7/22 provider message. I spoke with [name], patient's niece who handles all of the patient's appointments. She is aware of appointment date and time.

## 2019-11-30 DIAGNOSIS — D509 Iron deficiency anemia, unspecified: Secondary | ICD-10-CM | POA: Diagnosis not present

## 2019-11-30 DIAGNOSIS — N2581 Secondary hyperparathyroidism of renal origin: Secondary | ICD-10-CM | POA: Diagnosis not present

## 2019-11-30 DIAGNOSIS — Z992 Dependence on renal dialysis: Secondary | ICD-10-CM | POA: Diagnosis not present

## 2019-11-30 DIAGNOSIS — N186 End stage renal disease: Secondary | ICD-10-CM | POA: Diagnosis not present

## 2019-11-30 DIAGNOSIS — D631 Anemia in chronic kidney disease: Secondary | ICD-10-CM | POA: Diagnosis not present

## 2019-11-30 NOTE — Progress Notes (Signed)
I met with the patient and his niece [name]. He was discharged from the hospital yesterday and did go on to dialysis (HD) at his usual site in [place]. We reviewed his MRI and a copy of the scan was provided to them. Dr. Lisbeth Renshaw has reviewed this and the patient remains a good candidate for stereotactic radiosurgery [facility]) to the 4 lesions. He is feeling pretty well and feels that his strength is improving. He has 5/5 strength in bilateral upper and lower extremities. He will continue his current steroids of dexamethasone 4 mg TID, but on Sunday 12/02/19 will start taking 33m BID until he completes treatment. He is aware to notify [place] any rebound symptoms such as headaches or weakness/numbness as he presented with. We will taper the steroids further with instructions on the day of his treatment.    ACarola Rhine PAC

## 2019-12-03 DIAGNOSIS — N2581 Secondary hyperparathyroidism of renal origin: Secondary | ICD-10-CM | POA: Diagnosis not present

## 2019-12-03 DIAGNOSIS — D509 Iron deficiency anemia, unspecified: Secondary | ICD-10-CM | POA: Diagnosis not present

## 2019-12-03 DIAGNOSIS — Z992 Dependence on renal dialysis: Secondary | ICD-10-CM | POA: Diagnosis not present

## 2019-12-03 DIAGNOSIS — D631 Anemia in chronic kidney disease: Secondary | ICD-10-CM | POA: Diagnosis not present

## 2019-12-03 DIAGNOSIS — N186 End stage renal disease: Secondary | ICD-10-CM | POA: Diagnosis not present

## 2019-12-04 ENCOUNTER — Inpatient Hospital Stay: Payer: Medicare Other | Attending: Oncology

## 2019-12-04 DIAGNOSIS — G936 Cerebral edema: Secondary | ICD-10-CM | POA: Diagnosis not present

## 2019-12-04 DIAGNOSIS — C649 Malignant neoplasm of unspecified kidney, except renal pelvis: Secondary | ICD-10-CM | POA: Diagnosis not present

## 2019-12-04 DIAGNOSIS — C7931 Secondary malignant neoplasm of brain: Secondary | ICD-10-CM | POA: Diagnosis not present

## 2019-12-04 DIAGNOSIS — C3411 Malignant neoplasm of upper lobe, right bronchus or lung: Secondary | ICD-10-CM | POA: Diagnosis not present

## 2019-12-04 DIAGNOSIS — M9701XD Periprosthetic fracture around internal prosthetic right hip joint, subsequent encounter: Secondary | ICD-10-CM | POA: Diagnosis not present

## 2019-12-04 DIAGNOSIS — M19041 Primary osteoarthritis, right hand: Secondary | ICD-10-CM | POA: Diagnosis not present

## 2019-12-05 DIAGNOSIS — C3411 Malignant neoplasm of upper lobe, right bronchus or lung: Secondary | ICD-10-CM | POA: Diagnosis not present

## 2019-12-05 DIAGNOSIS — C7931 Secondary malignant neoplasm of brain: Secondary | ICD-10-CM | POA: Diagnosis not present

## 2019-12-05 DIAGNOSIS — C7901 Secondary malignant neoplasm of right kidney and renal pelvis: Secondary | ICD-10-CM | POA: Diagnosis not present

## 2019-12-05 DIAGNOSIS — N186 End stage renal disease: Secondary | ICD-10-CM | POA: Diagnosis not present

## 2019-12-05 DIAGNOSIS — Z992 Dependence on renal dialysis: Secondary | ICD-10-CM | POA: Diagnosis not present

## 2019-12-05 DIAGNOSIS — C3412 Malignant neoplasm of upper lobe, left bronchus or lung: Secondary | ICD-10-CM | POA: Diagnosis not present

## 2019-12-05 DIAGNOSIS — D509 Iron deficiency anemia, unspecified: Secondary | ICD-10-CM | POA: Diagnosis not present

## 2019-12-05 DIAGNOSIS — D631 Anemia in chronic kidney disease: Secondary | ICD-10-CM | POA: Diagnosis not present

## 2019-12-05 DIAGNOSIS — N2581 Secondary hyperparathyroidism of renal origin: Secondary | ICD-10-CM | POA: Diagnosis not present

## 2019-12-06 DIAGNOSIS — C3490 Malignant neoplasm of unspecified part of unspecified bronchus or lung: Secondary | ICD-10-CM | POA: Diagnosis not present

## 2019-12-06 DIAGNOSIS — Z681 Body mass index (BMI) 19 or less, adult: Secondary | ICD-10-CM | POA: Diagnosis not present

## 2019-12-06 DIAGNOSIS — J449 Chronic obstructive pulmonary disease, unspecified: Secondary | ICD-10-CM | POA: Diagnosis not present

## 2019-12-06 DIAGNOSIS — Z992 Dependence on renal dialysis: Secondary | ICD-10-CM | POA: Diagnosis not present

## 2019-12-06 DIAGNOSIS — Z1389 Encounter for screening for other disorder: Secondary | ICD-10-CM | POA: Diagnosis not present

## 2019-12-07 DIAGNOSIS — N186 End stage renal disease: Secondary | ICD-10-CM | POA: Diagnosis not present

## 2019-12-07 DIAGNOSIS — N2581 Secondary hyperparathyroidism of renal origin: Secondary | ICD-10-CM | POA: Diagnosis not present

## 2019-12-07 DIAGNOSIS — D631 Anemia in chronic kidney disease: Secondary | ICD-10-CM | POA: Diagnosis not present

## 2019-12-07 DIAGNOSIS — Z992 Dependence on renal dialysis: Secondary | ICD-10-CM | POA: Diagnosis not present

## 2019-12-07 DIAGNOSIS — D509 Iron deficiency anemia, unspecified: Secondary | ICD-10-CM | POA: Diagnosis not present

## 2019-12-08 DIAGNOSIS — Z992 Dependence on renal dialysis: Secondary | ICD-10-CM | POA: Diagnosis not present

## 2019-12-08 DIAGNOSIS — N186 End stage renal disease: Secondary | ICD-10-CM | POA: Diagnosis not present

## 2019-12-09 DIAGNOSIS — N2581 Secondary hyperparathyroidism of renal origin: Secondary | ICD-10-CM | POA: Diagnosis not present

## 2019-12-09 DIAGNOSIS — N186 End stage renal disease: Secondary | ICD-10-CM | POA: Diagnosis not present

## 2019-12-09 DIAGNOSIS — Z992 Dependence on renal dialysis: Secondary | ICD-10-CM | POA: Diagnosis not present

## 2019-12-09 DIAGNOSIS — D509 Iron deficiency anemia, unspecified: Secondary | ICD-10-CM | POA: Diagnosis not present

## 2019-12-09 DIAGNOSIS — D631 Anemia in chronic kidney disease: Secondary | ICD-10-CM | POA: Diagnosis not present

## 2019-12-10 ENCOUNTER — Other Ambulatory Visit: Payer: Self-pay | Admitting: Radiation Oncology

## 2019-12-10 ENCOUNTER — Telehealth: Payer: Self-pay | Admitting: *Deleted

## 2019-12-10 DIAGNOSIS — D631 Anemia in chronic kidney disease: Secondary | ICD-10-CM | POA: Diagnosis not present

## 2019-12-10 DIAGNOSIS — D509 Iron deficiency anemia, unspecified: Secondary | ICD-10-CM | POA: Diagnosis not present

## 2019-12-10 DIAGNOSIS — N186 End stage renal disease: Secondary | ICD-10-CM | POA: Diagnosis not present

## 2019-12-10 DIAGNOSIS — Z992 Dependence on renal dialysis: Secondary | ICD-10-CM | POA: Diagnosis not present

## 2019-12-10 DIAGNOSIS — N2581 Secondary hyperparathyroidism of renal origin: Secondary | ICD-10-CM | POA: Diagnosis not present

## 2019-12-10 NOTE — Telephone Encounter (Signed)
Spoke with the patients niece to let her know I received her voicemail and I have discussed her concerns with the PA.  It is advised for the patient to increase his decadron dose to three times a day and continue to take his keppra twice daily and we will see the patient tomorrow.  She verbalized understanding.  Will continue to follow as necessary.  Gloriajean Dell. Leonie Green, BSN

## 2019-12-11 ENCOUNTER — Other Ambulatory Visit: Payer: Self-pay

## 2019-12-11 ENCOUNTER — Encounter: Payer: Self-pay | Admitting: Radiation Oncology

## 2019-12-11 ENCOUNTER — Ambulatory Visit
Admission: RE | Admit: 2019-12-11 | Discharge: 2019-12-11 | Disposition: A | Discharge status: Home / Self Care | Payer: Medicare Other | Source: Ambulatory Visit | Attending: Radiation Oncology | Admitting: Radiation Oncology

## 2019-12-11 ENCOUNTER — Inpatient Hospital Stay: Payer: Medicare Other | Attending: Oncology | Admitting: Internal Medicine

## 2019-12-11 DIAGNOSIS — C3411 Malignant neoplasm of upper lobe, right bronchus or lung: Secondary | ICD-10-CM

## 2019-12-11 DIAGNOSIS — Z992 Dependence on renal dialysis: Secondary | ICD-10-CM | POA: Diagnosis not present

## 2019-12-11 DIAGNOSIS — D509 Iron deficiency anemia, unspecified: Secondary | ICD-10-CM | POA: Diagnosis not present

## 2019-12-11 DIAGNOSIS — C7931 Secondary malignant neoplasm of brain: Secondary | ICD-10-CM | POA: Diagnosis not present

## 2019-12-11 DIAGNOSIS — D631 Anemia in chronic kidney disease: Secondary | ICD-10-CM | POA: Diagnosis not present

## 2019-12-11 DIAGNOSIS — C3412 Malignant neoplasm of upper lobe, left bronchus or lung: Secondary | ICD-10-CM | POA: Diagnosis not present

## 2019-12-11 DIAGNOSIS — N186 End stage renal disease: Secondary | ICD-10-CM

## 2019-12-11 DIAGNOSIS — C7901 Secondary malignant neoplasm of right kidney and renal pelvis: Secondary | ICD-10-CM | POA: Insufficient documentation

## 2019-12-11 DIAGNOSIS — N2581 Secondary hyperparathyroidism of renal origin: Secondary | ICD-10-CM | POA: Diagnosis not present

## 2019-12-11 NOTE — Progress Notes (Signed)
Mr. Self rested with [place] for 30 minutes following his SRS treatment.  Patient denies headache, dizziness, nausea, diplopia or ringing in the ears. Denies fatigue. Patient without complaints. Understands to avoid strenuous activity for the next 24 hours and call [phone] with needs.   BP (!) 162/58   Pulse 63   Temp 98.8 F (37.1 C)   Resp 20   SpO2 99%    [name] M. Leonie Green, BSN

## 2019-12-12 DIAGNOSIS — N2581 Secondary hyperparathyroidism of renal origin: Secondary | ICD-10-CM | POA: Diagnosis not present

## 2019-12-12 DIAGNOSIS — D631 Anemia in chronic kidney disease: Secondary | ICD-10-CM | POA: Diagnosis not present

## 2019-12-12 DIAGNOSIS — D509 Iron deficiency anemia, unspecified: Secondary | ICD-10-CM | POA: Diagnosis not present

## 2019-12-12 DIAGNOSIS — N186 End stage renal disease: Secondary | ICD-10-CM | POA: Diagnosis not present

## 2019-12-12 DIAGNOSIS — Z992 Dependence on renal dialysis: Secondary | ICD-10-CM | POA: Diagnosis not present

## 2019-12-14 DIAGNOSIS — N186 End stage renal disease: Secondary | ICD-10-CM | POA: Diagnosis not present

## 2019-12-14 DIAGNOSIS — N2581 Secondary hyperparathyroidism of renal origin: Secondary | ICD-10-CM | POA: Diagnosis not present

## 2019-12-14 DIAGNOSIS — D631 Anemia in chronic kidney disease: Secondary | ICD-10-CM | POA: Diagnosis not present

## 2019-12-14 DIAGNOSIS — D509 Iron deficiency anemia, unspecified: Secondary | ICD-10-CM | POA: Diagnosis not present

## 2019-12-14 DIAGNOSIS — Z992 Dependence on renal dialysis: Secondary | ICD-10-CM | POA: Diagnosis not present

## 2019-12-14 NOTE — Progress Notes (Signed)
[name] was seen in follow-up in the palliative oncology clinic after he was seen inpatient by our team on 7/16. At that time ACP was done and goals of care were discussed. He is currently receiving radiation for brain metastasis. Overall doing fairly well today, fatigued from treatment. Some tremor in his left hand and difficulty with his coordination. He had a fall out of the bed at home but had no injuries. He has in home caregivers who provide assistance with his ADLs and help with safety issues. His niece [name] is his HCPOA and provides excellent support and care coordination. We discussed safety at home and ways to help with home care - ie. Technology, monitors and bedrail.  1. DNR signed today, did not have a copy after hospitalization. 2. [facility] consultation 3. Recently increased decadron and keppra, may be reasonable to have diastat or SL diazapam at home in case of seizure- will defer to neuro team.  Lane Hacker, DO Palliative Medicine   Time: 25 minutes Greater than 50%  of this time was spent counseling and coordinating care related to the above assessment and plan.

## 2019-12-15 DIAGNOSIS — M9701XD Periprosthetic fracture around internal prosthetic right hip joint, subsequent encounter: Secondary | ICD-10-CM | POA: Diagnosis not present

## 2019-12-15 DIAGNOSIS — C3411 Malignant neoplasm of upper lobe, right bronchus or lung: Secondary | ICD-10-CM | POA: Diagnosis not present

## 2019-12-15 DIAGNOSIS — G936 Cerebral edema: Secondary | ICD-10-CM | POA: Diagnosis not present

## 2019-12-15 DIAGNOSIS — C7931 Secondary malignant neoplasm of brain: Secondary | ICD-10-CM | POA: Diagnosis not present

## 2019-12-15 DIAGNOSIS — M19041 Primary osteoarthritis, right hand: Secondary | ICD-10-CM | POA: Diagnosis not present

## 2019-12-15 DIAGNOSIS — C649 Malignant neoplasm of unspecified kidney, except renal pelvis: Secondary | ICD-10-CM | POA: Diagnosis not present

## 2019-12-17 DIAGNOSIS — N2581 Secondary hyperparathyroidism of renal origin: Secondary | ICD-10-CM | POA: Diagnosis not present

## 2019-12-17 DIAGNOSIS — Z992 Dependence on renal dialysis: Secondary | ICD-10-CM | POA: Diagnosis not present

## 2019-12-17 DIAGNOSIS — N186 End stage renal disease: Secondary | ICD-10-CM | POA: Diagnosis not present

## 2019-12-17 DIAGNOSIS — D509 Iron deficiency anemia, unspecified: Secondary | ICD-10-CM | POA: Diagnosis not present

## 2019-12-17 DIAGNOSIS — D631 Anemia in chronic kidney disease: Secondary | ICD-10-CM | POA: Diagnosis not present

## 2019-12-18 ENCOUNTER — Telehealth: Payer: Self-pay

## 2019-12-18 DIAGNOSIS — M9701XD Periprosthetic fracture around internal prosthetic right hip joint, subsequent encounter: Secondary | ICD-10-CM | POA: Diagnosis not present

## 2019-12-18 DIAGNOSIS — C649 Malignant neoplasm of unspecified kidney, except renal pelvis: Secondary | ICD-10-CM | POA: Diagnosis not present

## 2019-12-18 DIAGNOSIS — C3411 Malignant neoplasm of upper lobe, right bronchus or lung: Secondary | ICD-10-CM | POA: Diagnosis not present

## 2019-12-18 DIAGNOSIS — M19041 Primary osteoarthritis, right hand: Secondary | ICD-10-CM | POA: Diagnosis not present

## 2019-12-18 DIAGNOSIS — G936 Cerebral edema: Secondary | ICD-10-CM | POA: Diagnosis not present

## 2019-12-18 DIAGNOSIS — C7931 Secondary malignant neoplasm of brain: Secondary | ICD-10-CM | POA: Diagnosis not present

## 2019-12-18 NOTE — Telephone Encounter (Signed)
Telephone call to schedule palliative care visit.  Patients niece [name] requests visit be made on 12/24/19 at 9:00 AM as she will be able to attend.  Visit scheduled.

## 2019-12-19 DIAGNOSIS — D509 Iron deficiency anemia, unspecified: Secondary | ICD-10-CM | POA: Diagnosis not present

## 2019-12-19 DIAGNOSIS — Z992 Dependence on renal dialysis: Secondary | ICD-10-CM | POA: Diagnosis not present

## 2019-12-19 DIAGNOSIS — D631 Anemia in chronic kidney disease: Secondary | ICD-10-CM | POA: Diagnosis not present

## 2019-12-19 DIAGNOSIS — N2581 Secondary hyperparathyroidism of renal origin: Secondary | ICD-10-CM | POA: Diagnosis not present

## 2019-12-19 DIAGNOSIS — N186 End stage renal disease: Secondary | ICD-10-CM | POA: Diagnosis not present

## 2019-12-20 DIAGNOSIS — G936 Cerebral edema: Secondary | ICD-10-CM | POA: Diagnosis not present

## 2019-12-20 DIAGNOSIS — M19041 Primary osteoarthritis, right hand: Secondary | ICD-10-CM | POA: Diagnosis not present

## 2019-12-20 DIAGNOSIS — M9701XD Periprosthetic fracture around internal prosthetic right hip joint, subsequent encounter: Secondary | ICD-10-CM | POA: Diagnosis not present

## 2019-12-20 DIAGNOSIS — C649 Malignant neoplasm of unspecified kidney, except renal pelvis: Secondary | ICD-10-CM | POA: Diagnosis not present

## 2019-12-20 DIAGNOSIS — W19XXXA Unspecified fall, initial encounter: Secondary | ICD-10-CM | POA: Diagnosis not present

## 2019-12-20 DIAGNOSIS — C7931 Secondary malignant neoplasm of brain: Secondary | ICD-10-CM | POA: Diagnosis not present

## 2019-12-20 DIAGNOSIS — R69 Illness, unspecified: Secondary | ICD-10-CM | POA: Diagnosis not present

## 2019-12-20 DIAGNOSIS — C3411 Malignant neoplasm of upper lobe, right bronchus or lung: Secondary | ICD-10-CM | POA: Diagnosis not present

## 2019-12-20 DIAGNOSIS — R5381 Other malaise: Secondary | ICD-10-CM | POA: Diagnosis not present

## 2019-12-21 ENCOUNTER — Telehealth: Payer: Self-pay

## 2019-12-21 DIAGNOSIS — Z992 Dependence on renal dialysis: Secondary | ICD-10-CM | POA: Diagnosis not present

## 2019-12-21 DIAGNOSIS — N2581 Secondary hyperparathyroidism of renal origin: Secondary | ICD-10-CM | POA: Diagnosis not present

## 2019-12-21 DIAGNOSIS — D631 Anemia in chronic kidney disease: Secondary | ICD-10-CM | POA: Diagnosis not present

## 2019-12-21 DIAGNOSIS — D509 Iron deficiency anemia, unspecified: Secondary | ICD-10-CM | POA: Diagnosis not present

## 2019-12-21 DIAGNOSIS — N186 End stage renal disease: Secondary | ICD-10-CM | POA: Diagnosis not present

## 2019-12-21 NOTE — Telephone Encounter (Signed)
I received a VM from Patient's POA [name] regarding an appt due to patient having arm swelling and weeping from his hand. I advised [name] that patient is in the process of being scheduled and that our surgery scheduler will be in contact to go over details and appt date. She requested to know when she should expect to receive a call back. I advised per Herma Ard, RN it will be sometime next week. POA voiced her understanding.

## 2019-12-21 NOTE — Telephone Encounter (Signed)
Received referral from [facility] - swollen extremities of access arm, possible subclavian stenosis, fistulagram requested.

## 2019-12-21 NOTE — Telephone Encounter (Signed)
Telephone call from patients niece [name].  [name] requests to reschedule visit for Monday 12/24/19 at 9:30 AM as patient has dialysis.  [name] in agreement with palliative care home visit 12/25/19 at 1:30 PM.

## 2019-12-24 ENCOUNTER — Emergency Department (HOSPITAL_COMMUNITY): Payer: Medicare Other

## 2019-12-24 ENCOUNTER — Inpatient Hospital Stay (HOSPITAL_COMMUNITY)
Admission: EM | Admit: 2019-12-24 | Discharge: 2019-12-26 | DRG: 054 | Disposition: A | Discharge status: Hospice | Payer: Medicare Other | Attending: Internal Medicine | Admitting: Internal Medicine

## 2019-12-24 ENCOUNTER — Other Ambulatory Visit: Payer: Self-pay

## 2019-12-24 ENCOUNTER — Telehealth: Payer: Self-pay

## 2019-12-24 DIAGNOSIS — Z66 Do not resuscitate: Secondary | ICD-10-CM | POA: Diagnosis present

## 2019-12-24 DIAGNOSIS — Z20822 Contact with and (suspected) exposure to covid-19: Secondary | ICD-10-CM | POA: Diagnosis present

## 2019-12-24 DIAGNOSIS — H9190 Unspecified hearing loss, unspecified ear: Secondary | ICD-10-CM | POA: Diagnosis present

## 2019-12-24 DIAGNOSIS — Z902 Acquired absence of lung [part of]: Secondary | ICD-10-CM

## 2019-12-24 DIAGNOSIS — Z720 Tobacco use: Secondary | ICD-10-CM | POA: Diagnosis not present

## 2019-12-24 DIAGNOSIS — Z905 Acquired absence of kidney: Secondary | ICD-10-CM

## 2019-12-24 DIAGNOSIS — I639 Cerebral infarction, unspecified: Secondary | ICD-10-CM

## 2019-12-24 DIAGNOSIS — D638 Anemia in other chronic diseases classified elsewhere: Secondary | ICD-10-CM | POA: Diagnosis present

## 2019-12-24 DIAGNOSIS — Z87442 Personal history of urinary calculi: Secondary | ICD-10-CM

## 2019-12-24 DIAGNOSIS — I4892 Unspecified atrial flutter: Secondary | ICD-10-CM | POA: Diagnosis not present

## 2019-12-24 DIAGNOSIS — E860 Dehydration: Secondary | ICD-10-CM | POA: Diagnosis present

## 2019-12-24 DIAGNOSIS — Z8249 Family history of ischemic heart disease and other diseases of the circulatory system: Secondary | ICD-10-CM

## 2019-12-24 DIAGNOSIS — G936 Cerebral edema: Secondary | ICD-10-CM | POA: Diagnosis present

## 2019-12-24 DIAGNOSIS — Z992 Dependence on renal dialysis: Secondary | ICD-10-CM

## 2019-12-24 DIAGNOSIS — Z993 Dependence on wheelchair: Secondary | ICD-10-CM

## 2019-12-24 DIAGNOSIS — I480 Paroxysmal atrial fibrillation: Secondary | ICD-10-CM | POA: Diagnosis not present

## 2019-12-24 DIAGNOSIS — D539 Nutritional anemia, unspecified: Secondary | ICD-10-CM | POA: Diagnosis present

## 2019-12-24 DIAGNOSIS — R531 Weakness: Secondary | ICD-10-CM | POA: Diagnosis not present

## 2019-12-24 DIAGNOSIS — J90 Pleural effusion, not elsewhere classified: Secondary | ICD-10-CM | POA: Diagnosis not present

## 2019-12-24 DIAGNOSIS — N400 Enlarged prostate without lower urinary tract symptoms: Secondary | ICD-10-CM | POA: Diagnosis present

## 2019-12-24 DIAGNOSIS — I4891 Unspecified atrial fibrillation: Secondary | ICD-10-CM

## 2019-12-24 DIAGNOSIS — K219 Gastro-esophageal reflux disease without esophagitis: Secondary | ICD-10-CM | POA: Diagnosis present

## 2019-12-24 DIAGNOSIS — Z79899 Other long term (current) drug therapy: Secondary | ICD-10-CM | POA: Diagnosis not present

## 2019-12-24 DIAGNOSIS — I82409 Acute embolism and thrombosis of unspecified deep veins of unspecified lower extremity: Secondary | ICD-10-CM

## 2019-12-24 DIAGNOSIS — D631 Anemia in chronic kidney disease: Secondary | ICD-10-CM | POA: Diagnosis not present

## 2019-12-24 DIAGNOSIS — N186 End stage renal disease: Secondary | ICD-10-CM | POA: Diagnosis not present

## 2019-12-24 DIAGNOSIS — I6389 Other cerebral infarction: Secondary | ICD-10-CM | POA: Diagnosis not present

## 2019-12-24 DIAGNOSIS — Z515 Encounter for palliative care: Secondary | ICD-10-CM | POA: Diagnosis not present

## 2019-12-24 DIAGNOSIS — R627 Adult failure to thrive: Secondary | ICD-10-CM | POA: Diagnosis present

## 2019-12-24 DIAGNOSIS — C3490 Malignant neoplasm of unspecified part of unspecified bronchus or lung: Secondary | ICD-10-CM | POA: Diagnosis present

## 2019-12-24 DIAGNOSIS — G6181 Chronic inflammatory demyelinating polyneuritis: Secondary | ICD-10-CM | POA: Diagnosis present

## 2019-12-24 DIAGNOSIS — Z96652 Presence of left artificial knee joint: Secondary | ICD-10-CM | POA: Diagnosis present

## 2019-12-24 DIAGNOSIS — J320 Chronic maxillary sinusitis: Secondary | ICD-10-CM | POA: Diagnosis not present

## 2019-12-24 DIAGNOSIS — I120 Hypertensive chronic kidney disease with stage 5 chronic kidney disease or end stage renal disease: Secondary | ICD-10-CM | POA: Diagnosis present

## 2019-12-24 DIAGNOSIS — R21 Rash and other nonspecific skin eruption: Secondary | ICD-10-CM | POA: Diagnosis not present

## 2019-12-24 DIAGNOSIS — R600 Localized edema: Secondary | ICD-10-CM | POA: Diagnosis not present

## 2019-12-24 DIAGNOSIS — J449 Chronic obstructive pulmonary disease, unspecified: Secondary | ICD-10-CM | POA: Diagnosis not present

## 2019-12-24 DIAGNOSIS — Z7189 Other specified counseling: Secondary | ICD-10-CM

## 2019-12-24 DIAGNOSIS — C7931 Secondary malignant neoplasm of brain: Principal | ICD-10-CM | POA: Diagnosis present

## 2019-12-24 DIAGNOSIS — C649 Malignant neoplasm of unspecified kidney, except renal pelvis: Secondary | ICD-10-CM | POA: Diagnosis present

## 2019-12-24 DIAGNOSIS — J3489 Other specified disorders of nose and nasal sinuses: Secondary | ICD-10-CM | POA: Diagnosis not present

## 2019-12-24 DIAGNOSIS — M7989 Other specified soft tissue disorders: Secondary | ICD-10-CM | POA: Diagnosis present

## 2019-12-24 DIAGNOSIS — R5383 Other fatigue: Secondary | ICD-10-CM | POA: Diagnosis not present

## 2019-12-24 DIAGNOSIS — I6523 Occlusion and stenosis of bilateral carotid arteries: Secondary | ICD-10-CM | POA: Diagnosis not present

## 2019-12-24 DIAGNOSIS — R54 Age-related physical debility: Secondary | ICD-10-CM | POA: Diagnosis present

## 2019-12-24 DIAGNOSIS — N12 Tubulo-interstitial nephritis, not specified as acute or chronic: Secondary | ICD-10-CM | POA: Diagnosis present

## 2019-12-24 DIAGNOSIS — D509 Iron deficiency anemia, unspecified: Secondary | ICD-10-CM | POA: Diagnosis not present

## 2019-12-24 DIAGNOSIS — Z981 Arthrodesis status: Secondary | ICD-10-CM

## 2019-12-24 DIAGNOSIS — Z96641 Presence of right artificial hip joint: Secondary | ICD-10-CM | POA: Diagnosis present

## 2019-12-24 DIAGNOSIS — I959 Hypotension, unspecified: Secondary | ICD-10-CM | POA: Diagnosis not present

## 2019-12-24 DIAGNOSIS — D7589 Other specified diseases of blood and blood-forming organs: Secondary | ICD-10-CM | POA: Diagnosis present

## 2019-12-24 DIAGNOSIS — N2581 Secondary hyperparathyroidism of renal origin: Secondary | ICD-10-CM | POA: Diagnosis not present

## 2019-12-24 DIAGNOSIS — I618 Other nontraumatic intracerebral hemorrhage: Secondary | ICD-10-CM | POA: Diagnosis not present

## 2019-12-24 LAB — COMPREHENSIVE METABOLIC PANEL
ALT: 19 U/L (ref 0–44)
AST: 13 U/L — ABNORMAL LOW (ref 15–41)
Albumin: 3 g/dL — ABNORMAL LOW (ref 3.5–5.0)
Alkaline Phosphatase: 76 U/L (ref 38–126)
Anion gap: 13 (ref 5–15)
BUN: 59 mg/dL — ABNORMAL HIGH (ref 8–23)
CO2: 24 mmol/L (ref 22–32)
Calcium: 7.7 mg/dL — ABNORMAL LOW (ref 8.9–10.3)
Chloride: 98 mmol/L (ref 98–111)
Creatinine, Ser: 5.51 mg/dL — ABNORMAL HIGH (ref 0.61–1.24)
GFR calc Af Amer: 10 mL/min — ABNORMAL LOW (ref >60)
GFR calc non Af Amer: 9 mL/min — ABNORMAL LOW (ref >60)
Glucose, Bld: 120 mg/dL — ABNORMAL HIGH (ref 70–99)
Potassium: 4.9 mmol/L (ref 3.5–5.1)
Sodium: 135 mmol/L (ref 135–145)
Total Bilirubin: 0.8 mg/dL (ref 0.3–1.2)
Total Protein: 5.9 g/dL — ABNORMAL LOW (ref 6.5–8.1)

## 2019-12-24 LAB — URINALYSIS, ROUTINE W REFLEX MICROSCOPIC
Bilirubin Urine: NEGATIVE
Glucose, UA: 50 mg/dL — AB
Ketones, ur: NEGATIVE mg/dL
Nitrite: NEGATIVE
Protein, ur: 100 mg/dL — AB
RBC / HPF: >50 RBC/hpf — ABNORMAL HIGH (ref 0–5)
Specific Gravity, Urine: 1.013 (ref 1.005–1.030)
WBC, UA: >50 WBC/hpf — ABNORMAL HIGH (ref 0–5)
pH: 6 (ref 5.0–8.0)

## 2019-12-24 LAB — CBC WITH DIFFERENTIAL/PLATELET
Abs Immature Granulocytes: 0.19 10*3/uL — ABNORMAL HIGH (ref 0.00–0.07)
Basophils Absolute: 0 10*3/uL (ref 0.0–0.1)
Basophils Relative: 0 %
Eosinophils Absolute: 0 10*3/uL (ref 0.0–0.5)
Eosinophils Relative: 0 %
HCT: 35 % — ABNORMAL LOW (ref 39.0–52.0)
Hemoglobin: 11.3 g/dL — ABNORMAL LOW (ref 13.0–17.0)
Immature Granulocytes: 4 %
Lymphocytes Relative: 4 %
Lymphs Abs: 0.2 10*3/uL — ABNORMAL LOW (ref 0.7–4.0)
MCH: 32.9 pg (ref 26.0–34.0)
MCHC: 32.3 g/dL (ref 30.0–36.0)
MCV: 102 fL — ABNORMAL HIGH (ref 80.0–100.0)
Monocytes Absolute: 0.6 10*3/uL (ref 0.1–1.0)
Monocytes Relative: 11 %
Neutro Abs: 4.5 10*3/uL (ref 1.7–7.7)
Neutrophils Relative %: 81 %
Platelets: 84 10*3/uL — ABNORMAL LOW (ref 150–400)
RBC: 3.43 MIL/uL — ABNORMAL LOW (ref 4.22–5.81)
RDW: 15.7 % — ABNORMAL HIGH (ref 11.5–15.5)
WBC: 5.5 10*3/uL (ref 4.0–10.5)
nRBC: 0 % (ref 0.0–0.2)

## 2019-12-24 LAB — LIPASE, BLOOD: Lipase: 69 U/L — ABNORMAL HIGH (ref 11–51)

## 2019-12-24 LAB — PROTIME-INR
INR: 1 (ref 0.8–1.2)
Prothrombin Time: 13.2 seconds (ref 11.4–15.2)

## 2019-12-24 LAB — APTT: aPTT: 28 seconds (ref 24–36)

## 2019-12-24 LAB — CBG MONITORING, ED: Glucose-Capillary: 120 mg/dL — ABNORMAL HIGH (ref 70–99)

## 2019-12-24 MED ORDER — ACETAMINOPHEN 650 MG RE SUPP: 650.0000 mg | Freq: Four times a day (QID) | RECTAL | Status: DC | PRN

## 2019-12-24 MED ORDER — SODIUM CHLORIDE 0.9 % IV SOLN
1.0000 g | Freq: Once | INTRAVENOUS | Status: AC
  Administered 2019-12-24: 1 g via INTRAVENOUS
  Filled 2019-12-24: qty 10

## 2019-12-24 MED ORDER — ACETAMINOPHEN 325 MG PO TABS: 650.0000 mg | ORAL_TABLET | Freq: Four times a day (QID) | ORAL | Status: DC | PRN

## 2019-12-24 NOTE — Telephone Encounter (Signed)
Spoke with niece [name] regarding patient. She states that he had pain down his left side to his buttock. Patient took and oxycodone at 1am and it relieved his pain. Patient took another before dialysis. Getting into car patient was unable to lift his left leg on his own. States he was dead weight. Patient felt the same way he feels when he has a seizures, he has been taking his Keppra. Per Dr Benay Spice sent message to Rad Onc about patient's symptoms and they should reach out to patient's niece. Patient has an appointment with Lattie Haw NP tomorrow. Went to document on patient and patient is in [facility] ED.

## 2019-12-24 NOTE — ED Triage Notes (Signed)
Has history of brain cancer and had a treatment 2 weeks ago, according to daughter he has been getting weaker on the left side. Patient states he went to dialysis  Today and oxygen was low but he refused treatment

## 2019-12-24 NOTE — ED Provider Notes (Signed)
[facility] EMERGENCY DEPARTMENT Provider Note   CSN: [id number] Arrival date & time: 12/24/19  1149     History Chief Complaint  Patient presents with  . Fatigue    [name] is a 86 y.o. male with a history as outlined below, most pertinent for history of lung cancer with metastasis to the brain, hypertension who is a dialysis, completed his dialysis session this morning presenting for evaluation of generalized fatigue and weakness.  Per patient and his niece [name] who is his primary caregiver he has had increasing generalized weakness with difficulty with ambulation.  [name] describes he had great difficulty getting into the car this morning in order to get transported for dialysis.  He normally uses a walker, also has a wheelchair for mobility.  This morning he required assistance getting from his wheelchair to the car.  He is followed by Dr. Benay Spice and Dr. Hilma Favors with palliative oncology and is receiving radiation for his brain metastasis.  He is also receiving Decadron to help reduce brain edema and was placed on Keppra for seizure prophylaxis with a recent hospitalization.  He has had no fevers or chills, denies increased shortness of breath beyond his baseline.  He is scheduled to see Dr. Benay Spice tomorrow morning and also has an appointment with the palliative care team tomorrow afternoon in his home.  The history is provided by the patient and a caregiver.       Past Medical History:  Diagnosis Date  . Arthritis    ankle , hip, knees , low back   . BPH (benign prostatic hypertrophy)   . Cancer ([place])    lung  . Cancer ([place]) 2001   renal  . 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 /w prednisone x 17 yrs. has been d/c for 3 yrs.   . Chronic kidney disease       . CIDP (chronic inflammatory demyelinating polyneuropathy) ([place])   . COPD (chronic obstructive pulmonary disease) ([place])    told that he has "a bit of  emphysema"  . Dialysis patient ([place])   . GERD (gastroesophageal reflux disease)   . Hypertension    stress test done - many years ago  . Kidney calculus   . Pneumonia     Patient Active Problem List   Diagnosis Date Noted  . 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 Renal cell carcinoma ([place]) 11/23/2019  . Palliative care by specialist   . Goals of care, counseling/discussion   . DNR (do not resuscitate)   . Metastasis to brain ([place]) 11/21/2019  . Seizure ([place]) 11/21/2019  . Renal cancer ([place]) 10/04/2019  . Periprosthetic fracture around internal prosthetic right hip joint ([place]) 10/04/2019 10/04/2019  . Community acquired pneumonia 05/30/2017  . Influenza A 05/30/2017  . BPH (benign prostatic hypertrophy) 05/30/2017  . COPD (chronic obstructive pulmonary disease) ([place]) 05/30/2017  . Hypertension 05/30/2017  . GERD (gastroesophageal reflux disease) 05/30/2017  . Pulmonary nodules/lesions, multiple 09/14/2016  . Bowel habit changes 09/11/2015  . Malignant neoplasm of right upper lobe of lung ([place]) 10/27/2012  . Visit for wound check 10/09/2012  . Swollen arm 09/15/2012  . Encounter for adequacy testing for hemodialysis ([place]) 09/01/2012  . Aftercare following surgery of the circulatory system, [place] 08/18/2012  . Other complications due to renal dialysis device, implant, and graft 04/19/2012  . End stage renal disease ([place]) 07/14/2011  . Pre-operative cardiovascular examination 04/07/2011    Past Surgical History:  Procedure Laterality Date  .  ANKLE FUSION     R ankle  . AV FISTULA PLACEMENT  06/01/2011   Procedure: ARTERIOVENOUS (AV) FISTULA CREATION;  Surgeon: Angelia Mould, MD;  Location: [facility] OR;  Service: Vascular;  Laterality: Left;  Creation of Left Arteriovenous fistula  . AV FISTULA PLACEMENT Left 07/12/2012   Procedure: ARTERIOVENOUS (AV) FISTULA CREATION;  Surgeon: Conrad Metzger, MD;  Location: [place];  Service: Vascular;  Laterality: Left;  . AV FISTULA  PLACEMENT Left 4.11.14  . BASCILIC VEIN TRANSPOSITION Left 08/28/2012   Procedure: BASCILIC VEIN TRANSPOSITION;  Surgeon: Conrad Victorville, MD;  Location: [place];  Service: Vascular;  Laterality: Left;  left 2nd stage basilic vein transposition  . CHOLECYSTECTOMY    . COLONOSCOPY  2005   Dr. Laural Golden, diverticulosis, hemorrhoids, 2 small polyps  . FISTULOGRAM N/A 08/16/2011   Procedure: FISTULOGRAM;  Surgeon: Angelia Mould, MD;  Location[facility] CATH LAB;  Service: Cardiovascular;  Laterality: N/A;  . fistulogram left radial cephalic AV fistula  [id number]     Dr. Scot Dock  . HIP ARTHROPLASTY Right    "partial"  . JOINT REPLACEMENT     L knee- 2010- [facility], L hip partial replacement   . LUNG REMOVAL, PARTIAL  2003   partial, right  . NEPHRECTOMY  2001   left  . SHUNTOGRAM N/A 05/15/2012   Procedure: Earney Mallet;  Surgeon: Conrad [place] Hope, MD;  Location: [facility] CATH LAB;  Service: Cardiovascular;  Laterality: N/A;  . SHUNTOGRAM N/A 05/09/2014   Procedure: FISTULOGRAM;  Surgeon: Conrad Reedley, MD;  Location: [facility] CATH LAB;  Service: Cardiovascular;  Laterality: N/A;       Family History  Problem Relation Age of Onset  . Heart disease Mother   . Heart disease Father   . Heart attack Father   . Anesthesia problems Neg Hx   . Hypotension Neg Hx   . Malignant hyperthermia Neg Hx   . Pseudochol deficiency Neg Hx   . Colon cancer Neg Hx     Social History   Tobacco Use  . Smoking status: Former Smoker    Years: 35.00    Types: Cigarettes    Quit date: 02/04/1977    Years since quitting: 42.9  . Smokeless tobacco: Current User    Types: Chew  . Tobacco comment: Quit in 1978  Vaping Use  . Vaping Use: Never used  Substance Use Topics  . Alcohol use: No    Alcohol/week: 0.0 standard drinks  . Drug use: No    Home Medications Prior to Admission medications   Medication Sig Start Date End Date Taking? Authorizing Provider  acetaminophen (TYLENOL) 325 MG tablet Take 2 tablets (650 mg total) by  mouth every 6 (six) hours as needed for mild pain or moderate pain. 10/05/19   Kathie Dike, MD  cinacalcet (SENSIPAR) 60 MG tablet Take 60 mg by mouth daily. Patient not taking: Reported on 11/29/2019    [provider]  dexamethasone (DECADRON) 4 MG tablet Take 1 tablet (4 mg total) by mouth every 8 (eight) hours. 11/27/19 12/27/19  Arrien, Jimmy Picket, MD  Fluticasone-Salmeterol (ADVAIR) 100-50 MCG/DOSE AEPB Inhale 1 puff into the lungs in the morning and at bedtime.  09/04/19   [provider]  levETIRAcetam (KEPPRA) 500 MG tablet Take 1 tablet (500 mg total) by mouth 2 (two) times daily. 11/27/19 12/27/19  Arrien, Jimmy Picket, MD  lidocaine-prilocaine (EMLA) cream Apply 1 application topically every Monday, Wednesday, and Friday with hemodialysis.  12/17/14   [provider]  metoprolol succinate (TOPROL-XL) 25 MG 24 hr tablet Take 25 mg by mouth daily.    [provider]  omeprazole (PRILOSEC) 20 MG capsule Take 20 mg by mouth daily.    [provider]  Sennosides-Docusate Sodium 8.6-50 MG CAPS Take 1 capsule by mouth in the morning and at bedtime.    [provider]  sevelamer carbonate (RENVELA) 800 MG tablet Take 800 mg by mouth 3 (three) times daily with meals.     [provider]  VENTOLIN HFA 108 (90 Base) MCG/ACT inhaler Inhale 2 puffs into the lungs every 4 (four) hours as needed for wheezing or shortness of breath.  09/04/19   [provider]    Allergies    Patient has no known allergies.  Review of Systems   Review of Systems  Constitutional: Positive for fatigue. Negative for fever.  HENT: Negative.  Negative for congestion.   Eyes: Negative.   Respiratory: Negative for chest tightness and shortness of breath.   Cardiovascular: Negative for chest pain.  Gastrointestinal: Negative for abdominal pain and nausea.  Genitourinary: Negative.   Musculoskeletal: Negative for arthralgias, joint swelling and  neck pain.  Skin: Negative.  Negative for rash and wound.  Neurological: Positive for weakness. Negative for dizziness, speech difficulty, light-headedness, numbness and headaches.  Psychiatric/Behavioral: Negative.     Physical Exam Updated Vital Signs BP 119/75   Pulse 86   Temp 98.4 F (36.9 C) (Oral)   Resp 20   Ht 5\' 6"  (1.676 m)   Wt 81.6 kg   SpO2 98%   BMI 29.05 kg/m   Physical Exam Vitals and nursing note reviewed.  Constitutional:      General: He is not in acute distress.    Appearance: He is well-developed.  HENT:     Head: Normocephalic and atraumatic.     Mouth/Throat:     Mouth: Mucous membranes are moist.  Eyes:     Conjunctiva/sclera: Conjunctivae normal.  Cardiovascular:     Rate and Rhythm: Normal rate and regular rhythm.     Heart sounds: Normal heart sounds.  Pulmonary:     Effort: Pulmonary effort is normal.     Breath sounds: Normal breath sounds. No wheezing.  Abdominal:     General: Bowel sounds are normal.     Palpations: Abdomen is soft.     Tenderness: There is no abdominal tenderness.  Musculoskeletal:        General: Normal range of motion.     Cervical back: Normal range of motion.  Skin:    General: Skin is warm and dry.  Neurological:     Mental Status: He is alert and oriented to person, place, and time.     Motor: Weakness present.     Comments: Generalized weakness.  3/5 lower extremities, 4/5 upper, equal grip strength.     ED Results / Procedures / Treatments   Labs (all labs ordered are listed, but only abnormal results are displayed) Labs Reviewed  CBC WITH DIFFERENTIAL/PLATELET - Abnormal; Notable for the following components:      Result Value   RBC 3.43 (*)    Hemoglobin 11.3 (*)    HCT 35.0 (*)    MCV 102.0 (*)    RDW 15.7 (*)    Platelets 84 (*)    Lymphs Abs 0.2 (*)    Abs Immature Granulocytes 0.19 (*)    All other components within normal limits  COMPREHENSIVE METABOLIC PANEL - Abnormal; Notable  for the  following components:   Glucose, Bld 120 (*)    BUN 59 (*)    Creatinine, Ser 5.51 (*)    Calcium 7.7 (*)    Total Protein 5.9 (*)    Albumin 3.0 (*)    AST 13 (*)    GFR calc non Af Amer 9 (*)    GFR calc Af Amer 10 (*)    All other components within normal limits  LIPASE, BLOOD - Abnormal; Notable for the following components:   Lipase 69 (*)    All other components within normal limits  CBG MONITORING, ED - Abnormal; Notable for the following components:   Glucose-Capillary 120 (*)    All other components within normal limits  SARS CORONAVIRUS 2 (TAT 6-24 HRS)  URINALYSIS, ROUTINE W REFLEX MICROSCOPIC    EKG None  Radiology DG Chest 2 View  Result Date: 12/24/2019 CLINICAL DATA:  86 year old male with weakness and fatigue. EXAM: CHEST - 2 VIEW COMPARISON:  Chest radiograph dated 05/30/2017. FINDINGS: There is a small left pleural effusion and left lung base patchy opacity which may represent atelectasis but concerning for pneumonia. Clinical correlation recommended. The right lung is clear. No pneumothorax. Stable cardiac silhouette. Atherosclerotic calcification of the aorta. No acute osseous pathology. Degenerative changes of the spine. IMPRESSION: Small left pleural effusion and left lung base patchy opacity concerning for pneumonia. Electronically Signed   By: Anner Crete M.D.   On: 12/24/2019 18:53   CT Head Wo Contrast  Result Date: 12/24/2019 CLINICAL DATA:  Has history of brain cancer and had a treatment 2 weeks ago, according to daughter he has been getting weaker on the left side, he had pain down his left side to his buttock. Patient took an oxycodone at 1am and it relieved his pain. Patient took another before dialysis. Getting into car patient was unable to lift his left leg on his own. EXAM: CT HEAD WITHOUT CONTRAST TECHNIQUE: Contiguous axial images were obtained from the base of the skull through the vertex without intravenous contrast. COMPARISON:  11/21/2019,  head CT and brain MRI. Brain MRI, 11/27/2019. FINDINGS: Brain: Low-attenuation reflecting vasogenic edema extends throughout the right parietal lobe involving the adjacent posterior right frontal lobe and involves a smaller area of the left frontal lobe. This is unchanged compared to the prior head CT. There is slight, 1-2 mm, midline shift of the posterosuperior falx cerebri, also stable. The metastatic lesions which were noted in the right parietal lobe, left frontal lobe and superior right cerebellum are not defined on CT. There are no new areas of vasogenic edema to suggest new metastatic lesions. There is no hydrocephalus. There is no evidence of acute/recent infarction. There are no extra-axial masses or abnormal fluid collections. There is no intracranial hemorrhage. Additional white matter hypoattenuation is noted bilaterally consistent with chronic microvascular ischemic change. Vascular: No hyperdense vessel or unexpected calcification. Skull: Normal. Negative for fracture or focal lesion. Sinuses/Orbits: Globes and orbits are unremarkable. The visualized sinuses show mild maxillary sinus mucosal thickening but are otherwise clear. Other: None. IMPRESSION: 1. No interval change in the head CT appearance from the study dated 11/21/2019. The right parietal and left frontal lobe metastatic lesions are not defined, but the significant vasogenic edema is well depicted and appears unchanged in extent. There is minimal stable mass effect from the right parietal lesion slightly deviating the superior falx cerebri to the left. Electronically Signed   By: Lajean Manes M.D.   On: 12/24/2019 15:36  Procedures Procedures (including critical care time)  Medications Ordered in ED Medications - No data to display  ED Course  I have reviewed the triage vital signs and the nursing notes.  Pertinent labs & imaging results that were available during my care of the patient were reviewed by me and considered in  my medical decision making (see chart for details).    MDM Rules/Calculators/A&P                          Patient with generalized worsening weakness pending urinalysis.  EKG was also ordered.  His head CT does not show any advancing metastatic lesions.  Chest x-ray is negative for pneumonia, his BUN and creatinine are both elevated, but stable in comparison to prior measurements.  He does have hypocalcemia at 7.7, his potassium is normal range.  Hemoglobin is 11.3, also a stable finding.  He does have thrombocytopenia at 84,000 which is new.  Lipase is also slightly bumped at 69.  He has no abdominal pain also no nausea or vomiting.  Discussed with Dr. Kathrynn Humble who assumes care pending urinalysis and reevaluation.  Of note, patient has an appointment with Dr. Elsworth Soho oncologist tomorrow morning in a palliative care appointment in his home tomorrow afternoon. Final Clinical Impression(s) / ED Diagnoses Final diagnoses:  None    Rx / DC Orders ED Discharge Orders    None       Landis Martins 12/24/19 [place], Ankit, MD 12/25/19 0021

## 2019-12-24 NOTE — H&P (Signed)
History and Physical  [name] JSE:[id number] DOB: [birth year] DOA: 12/24/2019  Referring physician: Varney Biles, MD PCP: Sharilyn Sites, MD  Outpatient Specialists: Cardiology Harl Bowie, Roderic Palau, MD) Patient coming from: Home  Chief Complaint: Generalized weakness  HPI: [name] is a 86 y.o. 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  After dialysis, patient was noted to be very weak and was asked to go to the ED for further evaluation and management. Patient also complained of left flank pain that started few days ago Patient follows with Dr. Benay Spice and Dr. Armandina Gemma with palliative oncology and is receiving radiation for his brain metastasis.  Patient was also being treated with Decadron for reduction of brain edema and was placed on Keppra for seizure prophylaxis with recent socialization. He has an appointment with Dr.  Benay Spice tomorrow morning and also has an appointment with the palliative care team tomorrow afternoon in his home, niece will call them in the morning possibly to reschedule.  ED Course: In the emergency department, he was tachycardic and tachypneic.  Work-up in the ED showed macrocytic anemia, hypoalbuminemia, BUN/creatinine 59/5.51, lipase 69.  Urinalysis showed large leukocytes, large hemoglobin,> 50 RBC and WBC.  Review of Systems: Constitutional: Positive for fatigue.  Negative for chills and fever.  HENT: Negative  for ear pain and sore throat.   Eyes: Negative for pain and visual disturbance.  Respiratory: Negative for cough, chest tightness and shortness of breath.   Cardiovascular: Negative for chest pain and palpitations.  Gastrointestinal: Negative for abdominal pain and vomiting.  Endocrine: Negative for polyphagia and polyuria.  Genitourinary: Positive for decreased urine volume (on HD) Musculoskeletal: Positive for left flank pain.  Negative for arthralgias and back pain.  Skin: Negative for color change and rash.  Allergic/Immunologic: Negative for immunocompromised state.  Neurological: Positive for weakness.  Negative for tremors, syncope, speech difficulty, Hematological: Does not bruise/bleed easily.  All other systems reviewed and are negative  Past Medical History:  Diagnosis Date  . Arthritis    ankle , hip, knees , low back   . BPH (benign prostatic hypertrophy)   . Cancer ([place])    lung  . Cancer ([place]) 2001   renal  . Chronic inflammatory demyelinating polyneuropathy ([place]) 04/07/11   ,diagnosed 1992, tx. /w prednisone x 17 yrs. has been d/c for 3 yrs.   . Chronic kidney disease       . CIDP (chronic inflammatory demyelinating polyneuropathy) ([place])   . COPD (chronic obstructive pulmonary disease) ([place])    told that he has "a bit of emphysema"  . Dialysis patient ([place])   . GERD (gastroesophageal reflux disease)   . Hypertension    stress test done - many years ago  . Kidney calculus   . Pneumonia    Past Surgical History:  Procedure Laterality Date  . ANKLE FUSION     R ankle  . AV FISTULA PLACEMENT  06/01/2011   Procedure: ARTERIOVENOUS (AV) FISTULA CREATION;  Surgeon: Angelia Mould, MD;  Location: [facility] OR;  Service: Vascular;  Laterality: Left;  Creation of Left Arteriovenous fistula  . AV FISTULA PLACEMENT Left 07/12/2012   Procedure: ARTERIOVENOUS (AV) FISTULA CREATION;  Surgeon: Conrad [place], MD;  Location: [facility];  Service: Vascular;  Laterality: Left;  . AV  FISTULA PLACEMENT Left 4.11.14  . BASCILIC VEIN TRANSPOSITION Left 08/28/2012   Procedure: BASCILIC VEIN TRANSPOSITION;  Surgeon: Conrad [place], MD;  Location: [place];  Service: Vascular;  Laterality: Left;  left 2nd stage basilic vein transposition  . CHOLECYSTECTOMY    . COLONOSCOPY  2005   Dr. Laural Golden, diverticulosis, hemorrhoids, 2 small polyps  . FISTULOGRAM N/A 08/16/2011   Procedure: FISTULOGRAM;  Surgeon: Angelia Mould, MD;  Location: [facility] CATH LAB;  Service: Cardiovascular;  Laterality: N/A;  . fistulogram left radial cephalic AV fistula  [id number]     Dr. Scot Dock  . HIP ARTHROPLASTY Right    "partial"  . JOINT REPLACEMENT     L knee- 2010- [facility], L hip partial replacement   . LUNG REMOVAL, PARTIAL  2003   partial, right  . NEPHRECTOMY  2001   left  . SHUNTOGRAM N/A 05/15/2012   Procedure: Earney Mallet;  Surgeon: Conrad [place], MD;  Location: [facility] C[facility] LAB;  Service: Cardiovascular;  Laterality: N/A;  . SHUNTOGRAM N/A 05/09/2014   Procedure: FISTULOGRAM;  Surgeon: Conrad , MD;  Location: [facility] CATH LAB;  Service: Cardiovascular;  Laterality: N/A;    Social History:  reports that he quit smoking about 42 years ago. His smoking use included cigarettes. He quit after 35.00 years of use. His smokeless tobacco use includes chew. He reports that he does not drink alcohol and does not use drugs.   No Known Allergies  Family History  Problem Relation Age of Onset  . Heart disease Mother   . Heart disease Father   . Heart attack Father   . Anesthesia problems Neg Hx   . Hypotension Neg Hx   . Malignant hyperthermia Neg Hx   . Pseudochol deficiency Neg Hx   . Colon cancer Neg Hx      Prior to Admission medications   Medication Sig Start Date End Date Taking? Authorizing Provider  acetaminophen (TYLENOL) 325 MG tablet Take 2 tablets (650 mg total) by mouth every 6 (six) hours as needed for mild pain or moderate pain. 10/05/19  Yes Kathie Dike, MD  dexamethasone (DECADRON) 4 MG  tablet Take 1 tablet (4 mg total) by mouth every 8 (eight) hours. 11/27/19 12/27/19 Yes Arrien, Jimmy Picket, MD  Fluticasone-Salmeterol (ADVAIR) 100-50 MCG/DOSE AEPB Inhale 1 puff into the lungs in the morning and at bedtime.  09/04/19  Yes [provider]  levETIRAcetam (KEPPRA) 500 MG tablet Take 1 tablet (500 mg total) by mouth 2 (two) times daily. 11/27/19 12/27/19 Yes Arrien, Jimmy Picket, MD  lidocaine-prilocaine (EMLA) cream Apply 1 application topically every Monday, Wednesday, and Friday with hemodialysis.  12/17/14  Yes [provider]  metoprolol succinate (TOPROL-XL) 25 MG 24 hr tablet Take 25 mg by mouth daily.   Yes [provider]  omeprazole (PRILOSEC) 20 MG capsule Take 20 mg by mouth daily.   Yes [provider]  oxycodone (OXY-IR) 5 MG capsule Take 5 mg by mouth every 4 (four) hours as needed for pain.   Yes [provider]  Sennosides-Docusate Sodium 8.6-50 MG CAPS Take 1 capsule by mouth in the morning and at bedtime.   Yes [provider]  VENTOLIN HFA 108 (90  Base) MCG/ACT inhaler Inhale 2 puffs into the lungs every 4 (four) hours as needed for wheezing or shortness of breath.  09/04/19  Yes [provider]  cinacalcet (SENSIPAR) 60 MG tablet Take 60 mg by mouth daily. Patient not taking: Reported on 11/29/2019    [provider]    Physical Exam: BP (!) 111/59   Pulse (!) 103   Temp 98.4 F (36.9 C) (Oral)   Resp (!) 22   Ht 5\' 6"  (1.676 m)   Wt 81.6 kg   SpO2 94%   BMI 29.05 kg/m   . General: 86 y.o. year-old male well developed well nourished in no acute distress.  Alert and oriented x3. [name] Kitchen HEENT: NCAT, EOMI . Neck: Supple, trachea midline . Cardiovascular: Regular rate and rhythm with no rubs or gallops.  No thyromegaly or JVD noted.  No lower extremity edema. 2/4 pulses in all 4 extremities. [name] Kitchen Respiratory: Clear to auscultation with no wheezes or rales. Good inspiratory effort. . Abdomen:  Soft nontender nondistended with normal bowel sounds x4 quadrants. . Muskuloskeletal: No cyanosis, clubbing or edema noted bilaterally . Neuro: CN II-XII intact, strength, sensation, reflexes . Skin: No ulcerative lesions noted or rashes . Psychiatry: Judgement and insight appear normal. Mood is appropriate for condition and setting          Labs on Admission:  Basic Metabolic Panel: Recent Labs  Lab 12/24/19 1516  NA 135  K 4.9  CL 98  CO2 24  GLUCOSE 120*  BUN 59*  CREATININE 5.51*  CALCIUM 7.7*   Liver Function Tests: Recent Labs  Lab 12/24/19 1516  AST 13*  ALT 19  ALKPHOS 76  BILITOT 0.8  PROT 5.9*  ALBUMIN 3.0*   Recent Labs  Lab 12/24/19 1516  LIPASE 69*   No results for input(s): AMMONIA in the last 168 hours. CBC: Recent Labs  Lab 12/24/19 1516  WBC 5.5  NEUTROABS 4.5  HGB 11.3*  HCT 35.0*  MCV 102.0*  PLT 84*   Cardiac Enzymes: No results for input(s): CKTOTAL, CKMB, CKMBINDEX, TROPONINI in the last 168 hours.  BNP (last 3 results) No results for input(s): BNP in the last 8760 hours.  ProBNP (last 3 results) No results for input(s): PROBNP in the last 8760 hours.  CBG: Recent Labs  Lab 12/24/19 1515  GLUCAP 120*    Radiological Exams on Admission: DG Chest 2 View  Result Date: 12/24/2019 CLINICAL DATA:  86 year old male with weakness and fatigue. EXAM: CHEST - 2 VIEW COMPARISON:  Chest radiograph dated 05/30/2017. FINDINGS: There is a small left pleural effusion and left lung base patchy opacity which may represent atelectasis but concerning for pneumonia. Clinical correlation recommended. The right lung is clear. No pneumothorax. Stable cardiac silhouette. Atherosclerotic calcification of the aorta. No acute osseous pathology. Degenerative changes of the spine. IMPRESSION: Small left pleural effusion and left lung base patchy opacity concerning for pneumonia. Electronically Signed   By: Anner Crete M.D.   On: 12/24/2019 18:53   CT  Head Wo Contrast  Result Date: 12/24/2019 CLINICAL DATA:  Has history of brain cancer and had a treatment 2 weeks ago, according to daughter he has been getting weaker on the left side, he had pain down his left side to his buttock. Patient took an oxycodone at 1am and it relieved his pain. Patient took another before dialysis. Getting into car patient was unable to lift his left leg on his own. EXAM: CT HEAD WITHOUT CONTRAST TECHNIQUE: Contiguous axial  images were obtained from the base of the skull through the vertex without intravenous contrast. COMPARISON:  11/21/2019, head CT and brain MRI. Brain MRI, 11/27/2019. FINDINGS: Brain: Low-attenuation reflecting vasogenic edema extends throughout the right parietal lobe involving the adjacent posterior right frontal lobe and involves a smaller area of the left frontal lobe. This is unchanged compared to the prior head CT. There is slight, 1-2 mm, midline shift of the posterosuperior falx cerebri, also stable. The metastatic lesions which were noted in the right parietal lobe, left frontal lobe and superior right cerebellum are not defined on CT. There are no new areas of vasogenic edema to suggest new metastatic lesions. There is no hydrocephalus. There is no evidence of acute/recent infarction. There are no extra-axial masses or abnormal fluid collections. There is no intracranial hemorrhage. Additional white matter hypoattenuation is noted bilaterally consistent with chronic microvascular ischemic change. Vascular: No hyperdense vessel or unexpected calcification. Skull: Normal. Negative for fracture or focal lesion. Sinuses/Orbits: Globes and orbits are unremarkable. The visualized sinuses show mild maxillary sinus mucosal thickening but are otherwise clear. Other: None. IMPRESSION: 1. No interval change in the head CT appearance from the study dated 11/21/2019. The right parietal and left frontal lobe metastatic lesions are not defined, but the significant  vasogenic edema is well depicted and appears unchanged in extent. There is minimal stable mass effect from the right parietal lesion slightly deviating the superior falx cerebri to the left. Electronically Signed   By: Lajean Manes M.D.   On: 12/24/2019 15:36    EKG: I independently viewed the EKG done and my findings are as followed: A. fib with RVR  Assessment/Plan Present on Admission: . GERD (gastroesophageal reflux disease) . End stage renal disease ([place]) . Swollen arm . Metastasis to brain ([place]) . COPD (chronic obstructive pulmonary disease) ([facility])  Active Problems:   End stage renal disease ([facility])   Swollen arm   COPD (chronic obstructive pulmonary disease) ([facility])   GERD (gastroesophageal reflux disease)   Metastasis to brain ([facility])   Generalized weakness   AF (paroxysmal atrial fibrillation) ([facility])   Generalized weakness with recent left-sided weakness R/O stroke  Patient was noted to have paroxysmal A. fib and was started that new left-sided weakness could have been related to CVA CT of head without contrast showed  No interval change in the head CT appearance from the study dated 11/21/2019. The right parietal and left frontal lobe metastatic lesions are not defined, but the significant vasogenic edema is well depicted and appears unchanged in extent. There is minimal stable mass effect from the right parietal lesion slightly deviating the superior falx cerebri to the left. Patient will be admitted to telemetry unit  Bilateral carotid ultrasound in the morning Echocardiogram in the morning MRI of brain without contrast in the morning Continue fall precautions and neuro checks Recent hemoglobin A1c done about 1 month ago was 5.9 Continue PT/OT eval and treat Neurology will be consulted and we shall await further recommendation   Paroxysmal A. Fib CHA2DS2- VASc score   is = 2   Which is  equal to = 2.2   Continue home Toprol  Left sided swollen arm shortness started with  formal Left upper extremity ultrasound will be done in the morning Patient awaiting to schedule with vascular surgeon regarding graft of left arm  ESRD on HD (MWF) Nephrology will be consulted for maintenance dialysis  Questionable pyelonephritis Patient was empirically started on IV antibiotics due to left flank pain  He has no fever or any other irritative bladder symptoms  Metastatic brain lesions with vasogenic edema Likely source of neurological sequelae Continue Keppra and Decadron Patient following with outpatient oncology  ESRD on HD MWF Nephrology will be consulted for maintenance hemodialysis  Last hemodialysis 8/16  Hypertension-stable Continue metoprolol  Anemia of chronic disease related to above-stable Continue to follow repeat labs  Renal cell carcinoma and bronchoalveolar carcinoma with metastatic disease Patient follows with Dr. Learta Codding  GERD Continue Protonix  History of COPD No active bronchospasms currently noted Duo nebs as needed Continue on Dulera  DVT prophylaxis: SCDs  Code Status: DNR  Family Communication: Niece at bedside (all questions answered to satisfaction)  Disposition Plan:  Patient is from:                        home Anticipated DC to:                   SNF or family members home Anticipated DC date:               2-3 days Anticipated DC barriers:          Unable to be discharged at this time due to generalized weakness and new onset left-sided weakness.   Consults called: Neurology  Admission status: Inpatient    Bernadette Hoit MD [facility]  If 7PM-7AM, please contact night-coverage [email or web address] Password [facility]  12/25/2019, 5:16 AM

## 2019-12-25 ENCOUNTER — Inpatient Hospital Stay (HOSPITAL_COMMUNITY): Payer: Medicare Other

## 2019-12-25 ENCOUNTER — Telehealth: Payer: Self-pay

## 2019-12-25 ENCOUNTER — Other Ambulatory Visit: Payer: Medicare Other

## 2019-12-25 ENCOUNTER — Other Ambulatory Visit: Payer: Self-pay | Admitting: Radiation Therapy

## 2019-12-25 ENCOUNTER — Other Ambulatory Visit: Payer: Self-pay

## 2019-12-25 ENCOUNTER — Inpatient Hospital Stay: Payer: Medicare Other | Admitting: Nurse Practitioner

## 2019-12-25 ENCOUNTER — Encounter (HOSPITAL_COMMUNITY): Payer: Self-pay | Admitting: Internal Medicine

## 2019-12-25 DIAGNOSIS — I480 Paroxysmal atrial fibrillation: Secondary | ICD-10-CM | POA: Diagnosis not present

## 2019-12-25 DIAGNOSIS — Z7189 Other specified counseling: Secondary | ICD-10-CM | POA: Insufficient documentation

## 2019-12-25 DIAGNOSIS — Z515 Encounter for palliative care: Secondary | ICD-10-CM

## 2019-12-25 LAB — PROTIME-INR
INR: 1 (ref 0.8–1.2)
Prothrombin Time: 12.4 seconds (ref 11.4–15.2)

## 2019-12-25 LAB — COMPREHENSIVE METABOLIC PANEL
ALT: 17 U/L (ref 0–44)
AST: 9 U/L — ABNORMAL LOW (ref 15–41)
Albumin: 2.8 g/dL — ABNORMAL LOW (ref 3.5–5.0)
Alkaline Phosphatase: 70 U/L (ref 38–126)
Anion gap: 13 (ref 5–15)
BUN: 69 mg/dL — ABNORMAL HIGH (ref 8–23)
CO2: 24 mmol/L (ref 22–32)
Calcium: 7.9 mg/dL — ABNORMAL LOW (ref 8.9–10.3)
Chloride: 96 mmol/L — ABNORMAL LOW (ref 98–111)
Creatinine, Ser: 6.58 mg/dL — ABNORMAL HIGH (ref 0.61–1.24)
GFR calc Af Amer: 8 mL/min — ABNORMAL LOW (ref >60)
GFR calc non Af Amer: 7 mL/min — ABNORMAL LOW (ref >60)
Glucose, Bld: 115 mg/dL — ABNORMAL HIGH (ref 70–99)
Potassium: 4.8 mmol/L (ref 3.5–5.1)
Sodium: 133 mmol/L — ABNORMAL LOW (ref 135–145)
Total Bilirubin: 0.8 mg/dL (ref 0.3–1.2)
Total Protein: 5.5 g/dL — ABNORMAL LOW (ref 6.5–8.1)

## 2019-12-25 LAB — MAGNESIUM: Magnesium: 2.1 mg/dL (ref 1.7–2.4)

## 2019-12-25 LAB — CBC
HCT: 34.3 % — ABNORMAL LOW (ref 39.0–52.0)
Hemoglobin: 10.7 g/dL — ABNORMAL LOW (ref 13.0–17.0)
MCH: 32.7 pg (ref 26.0–34.0)
MCHC: 31.2 g/dL (ref 30.0–36.0)
MCV: 104.9 fL — ABNORMAL HIGH (ref 80.0–100.0)
Platelets: 53 10*3/uL — ABNORMAL LOW (ref 150–400)
RBC: 3.27 MIL/uL — ABNORMAL LOW (ref 4.22–5.81)
RDW: 15.9 % — ABNORMAL HIGH (ref 11.5–15.5)
WBC: 5.1 10*3/uL (ref 4.0–10.5)
nRBC: 0 % (ref 0.0–0.2)

## 2019-12-25 LAB — APTT: aPTT: 26 seconds (ref 24–36)

## 2019-12-25 LAB — ECHOCARDIOGRAM COMPLETE
Area-P 1/2: 4.18 cm2
Height: 66 in
S' Lateral: 1.84 cm
Weight: 2880 oz

## 2019-12-25 LAB — SARS CORONAVIRUS 2 (TAT 6-24 HRS): SARS Coronavirus 2: NEGATIVE

## 2019-12-25 LAB — PHOSPHORUS: Phosphorus: 5.8 mg/dL — ABNORMAL HIGH (ref 2.5–4.6)

## 2019-12-25 MED ORDER — SENNOSIDES-DOCUSATE SODIUM 8.6-50 MG PO TABS
2.0000 | ORAL_TABLET | Freq: Every evening | ORAL | Status: DC | PRN
  Filled 2019-12-25: qty 2

## 2019-12-25 MED ORDER — PANTOPRAZOLE SODIUM 40 MG PO TBEC
40.0000 mg | DELAYED_RELEASE_TABLET | Freq: Every day | ORAL | Status: DC
  Administered 2019-12-25 – 2019-12-26 (×2): 40 mg via ORAL
  Filled 2019-12-25 (×2): qty 1

## 2019-12-25 MED ORDER — OXYCODONE HCL 5 MG PO TABS: 5.0000 mg | ORAL_TABLET | ORAL | Status: DC | PRN

## 2019-12-25 MED ORDER — METOPROLOL SUCCINATE ER 25 MG PO TB24
25.0000 mg | ORAL_TABLET | Freq: Every day | ORAL | Status: DC
  Administered 2019-12-25 – 2019-12-26 (×2): 25 mg via ORAL
  Filled 2019-12-25 (×2): qty 1

## 2019-12-25 MED ORDER — POLYETHYLENE GLYCOL 3350 17 G PO PACK: 17.0000 g | PACK | Freq: Every day | ORAL | Status: DC | PRN

## 2019-12-25 MED ORDER — LEVETIRACETAM 500 MG PO TABS
500.0000 mg | ORAL_TABLET | Freq: Two times a day (BID) | ORAL | Status: DC
  Administered 2019-12-25 – 2019-12-26 (×3): 500 mg via ORAL
  Filled 2019-12-25 (×3): qty 1

## 2019-12-25 MED ORDER — MOMETASONE FURO-FORMOTEROL FUM 100-5 MCG/ACT IN AERO
2.0000 | INHALATION_SPRAY | Freq: Two times a day (BID) | RESPIRATORY_TRACT | Status: DC
  Administered 2019-12-25 – 2019-12-26 (×2): 2 via RESPIRATORY_TRACT
  Filled 2019-12-25: qty 8.8

## 2019-12-25 MED ORDER — SODIUM CHLORIDE 0.9 % IV SOLN
1.0000 g | INTRAVENOUS | Status: DC
  Administered 2019-12-25: 1 g via INTRAVENOUS
  Filled 2019-12-25: qty 10

## 2019-12-25 MED ORDER — ALBUTEROL SULFATE HFA 108 (90 BASE) MCG/ACT IN AERS: 2.0000 | INHALATION_SPRAY | RESPIRATORY_TRACT | Status: DC | PRN

## 2019-12-25 MED ORDER — DEXAMETHASONE 4 MG PO TABS
4.0000 mg | ORAL_TABLET | Freq: Three times a day (TID) | ORAL | Status: DC
  Administered 2019-12-25 – 2019-12-26 (×3): 4 mg via ORAL
  Filled 2019-12-25 (×3): qty 1

## 2019-12-25 NOTE — Consult Note (Signed)
Consultation Note Date: 12/25/2019   Patient Name: [name]  DOB: May 18, 1930  MRN: [id number]  Age / Sex: 86 y.o., male  PCP: Sharilyn Sites, MD Referring Physician: Damita Lack, MD  Reason for Consultation: Establishing goals of care and Psychosocial/spiritual support  HPI/Patient Profile: 86 y.o. 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 fib.   Clinical Assessment and Goals of Care: [name], [name], is sitting up in the stretcher in the ED.  He appears acutely/chronically ill and frail.  He will briefly make but not keep eye contact.  He is hard of hearing, but alert and oriented, able to make his basic needs known.  Present today at this time as his healthcare surrogate, his niece, [name].  We talked about the treatment plan in detail including, but not limited to medications, imaging, consultations with other physicians and previous palliative conversations.  At this point [name] tells me that he will continue with hemodialysis if his graft can be made functionable.  He states that if the graft cannot be made to work, he would not want another placed, and would transition to hospice care.  At this point ultrasound has been completed but not interpreted.  [name] and I talked about what hospice care looks like, the options.  She shares that they were to have [facility] outpatient palliative to start, but he has been hospitalized.   [name] is a Firefighter in the [place] system.  She is known to palliative provider from a previous experience with her father and father.  CODE STATUS verified with niece.  Conference with attending, bedside nursing staff, transition of care team, HD RN related to patient condition, needs, goals of care.   HCPOA   HCPOA -niece, [name] shares that she is both medical and durable healthcare power of attorney    SUMMARY OF RECOMMENDATIONS   Continue to treat the treatable but no CPR or intubation. Continue with dialysis if graft can be made functionable If graft unable to be used, patient would want transition to hospice care.  Code Status/Advance Care Planning:  DNR  Symptom Management:   Per hospitalist, no additional needs at this time.  Palliative Prophylaxis:   Oral Care and Turn Reposition  Additional Recommendations (Limitations, Scope, Preferences):  Treat the treatable but no CPR or intubation  Psycho-social/Spiritual:   Desire for further Chaplaincy support:no  Additional Recommendations: Caregiving  Support/Resources and Education on Hospice  Prognosis:   Unable to determine, based on outcomes.  3 months or less would not be surprising based on poor functional status, frailty, chronic illness burden, metastatic cancer burden.  If patient and family elect no further  dialysis than 2 weeks or less would be expected, he would quickly qualify for at home or residential hospice care.  Discharge Planning: To be determined, based on outcomes.  Would qualify for residential hospice if no further hemodialysis is warranted.      Primary Diagnoses: Present on Admission: . GERD (gastroesophageal reflux disease) . End stage renal disease ([place]) . Swollen arm . Metastasis to brain ([place]) . COPD (chronic obstructive pulmonary disease) ([place])   I have reviewed the medical record, interviewed the patient and family, and examined the patient. The following  aspects are pertinent.  Past Medical History:  Diagnosis Date  . Arthritis    ankle , hip, knees , low back   . BPH (benign prostatic hypertrophy)   . Cancer ([place])    lung  . Cancer ([place]) 2001   renal  . Chronic inflammatory demyelinating polyneuropathy ([place]) 04/07/11   ,diagnosed 1992, tx. /w prednisone x 17 yrs. has been d/c for 3 yrs.   . Chronic kidney disease       . CIDP (chronic inflammatory demyelinating polyneuropathy) ([place])   . COPD (chronic obstructive pulmonary disease) ([place])    told that he has "a bit of emphysema"  . Dialysis patient ([place])   . GERD (gastroesophageal reflux disease)   . Hypertension    stress test done - many years ago  . Kidney calculus   . Pneumonia    Social History   Socioeconomic History  . Marital status: Married    Spouse name: Not on file  . Number of children: Not on file  . Years of education: Not on file  . Highest education level: Not on file  Occupational History  . Not on file  Tobacco Use  . Smoking status: Former Smoker    Years: 35.00    Types: Cigarettes    Quit date: 02/04/1977    Years since quitting: 42.9  . Smokeless tobacco: Current User    Types: Chew  . Tobacco comment: Quit in 1978  Vaping Use  . Vaping Use: Never used  Substance and Sexual Activity  . Alcohol use: No    Alcohol/week: 0.0 standard drinks  . Drug use: No  . Sexual activity: Not on file  Other Topics Concern  . Not on file  Social History Narrative  . Not on file   Social Determinants of Health   Financial Resource Strain:   . Difficulty of Paying Living Expenses:   Food Insecurity:   . Worried About Charity fundraiser in the Last Year:   . Arboriculturist in the Last Year:   Transportation Needs:   . Film/video editor (Medical):   [name] Kitchen Lack of Transportation (Non-Medical):   Physical Activity:   . Days of Exercise per Week:   . Minutes of Exercise per Session:   Stress:   . Feeling of Stress :   Social Connections:   .  Frequency of Communication with Friends and Family:   . Frequency of Social Gatherings with Friends and Family:   . Attends Religious Services:   . Active Member of Clubs or Organizations:   . Attends Archivist Meetings:   [name] Kitchen Marital Status:    Family History  Problem Relation Age of Onset  . Heart disease Mother   . Heart disease Father   . Heart attack Father   . Anesthesia problems Neg Hx   . Hypotension Neg Hx   . Malignant hyperthermia Neg Hx   .  Pseudochol deficiency Neg Hx   . Colon cancer Neg Hx    Scheduled Meds: . dexamethasone  4 mg Oral Q8H  . levETIRAcetam  500 mg Oral BID  . metoprolol succinate  25 mg Oral Daily  . mometasone-formoterol  2 puff Inhalation BID  . pantoprazole  40 mg Oral Daily   Continuous Infusions: PRN Meds:.acetaminophen **OR** acetaminophen, albuterol, oxyCODONE, polyethylene glycol, senna-docusate Medications Prior to Admission:  Prior to Admission medications   Medication Sig Start Date End Date Taking? Authorizing Provider  acetaminophen (TYLENOL) 325 MG tablet Take 2 tablets (650 mg total) by mouth every 6 (six) hours as needed for mild pain or moderate pain. 10/05/19  Yes Kathie Dike, MD  dexamethasone (DECADRON) 4 MG tablet Take 1 tablet (4 mg total) by mouth every 8 (eight) hours. 11/27/19 12/27/19 Yes Arrien, Jimmy Picket, MD  Fluticasone-Salmeterol (ADVAIR) 100-50 MCG/DOSE AEPB Inhale 1 puff into the lungs in the morning and at bedtime.  09/04/19  Yes [provider]  levETIRAcetam (KEPPRA) 500 MG tablet Take 1 tablet (500 mg total) by mouth 2 (two) times daily. 11/27/19 12/27/19 Yes Arrien, Jimmy Picket, MD  lidocaine-prilocaine (EMLA) cream Apply 1 application topically every Monday, Wednesday, and Friday with hemodialysis.  12/17/14  Yes [provider]  metoprolol succinate (TOPROL-XL) 25 MG 24 hr tablet Take 25 mg by mouth daily.   Yes [provider]  omeprazole (PRILOSEC) 20 MG capsule Take  20 mg by mouth daily.   Yes [provider]  oxycodone (OXY-IR) 5 MG capsule Take 5 mg by mouth every 4 (four) hours as needed for pain.   Yes [provider]  Sennosides-Docusate Sodium 8.6-50 MG CAPS Take 1 capsule by mouth in the morning and at bedtime.   Yes [provider]  VENTOLIN HFA 108 (90 Base) MCG/ACT inhaler Inhale 2 puffs into the lungs every 4 (four) hours as needed for wheezing or shortness of breath.  09/04/19  Yes [provider]  cinacalcet (SENSIPAR) 60 MG tablet Take 60 mg by mouth daily. Patient not taking: Reported on 11/29/2019    [provider]   No Known Allergies Review of Systems  Unable to perform ROS: Age    Physical Exam Vitals and nursing note reviewed.  Constitutional:      General: He is not in acute distress.    Appearance: He is obese. He is ill-appearing.  HENT:     Head: Normocephalic and atraumatic.     Mouth/Throat:     Mouth: Mucous membranes are moist.  Cardiovascular:     Rate and Rhythm: Normal rate.  Abdominal:     Comments: Soft rounded abdomen  Skin:    General: Skin is warm and dry.  Neurological:     Mental Status: He is alert.     Comments: Oriented to person, place, situation  Psychiatric:        Mood and Affect: Mood normal.        Behavior: Behavior normal.     Comments: Calm and cooperative     Vital Signs: BP 128/64   Pulse (!) 101   Temp 98.4 F (36.9 C) (Oral)   Resp 16   Ht 5\' 6"  (1.676 m)   Wt 81.6 kg   SpO2 98%   BMI 29.05 kg/m          SpO2: SpO2: 98 % O2 Device:SpO2: 98 % O2 Flow Rate: .   IO: Intake/output summary:   Intake/Output Summary (Last 24 hours) at  12/25/2019 1343 Last data filed at 12/24/2019 2209 Gross per 24 hour  Intake 100 ml  Output --  Net 100 ml    LBM:   Baseline Weight: Weight: 81.6 kg Most recent weight: Weight: 81.6 kg     Palliative Assessment/Data:   Flowsheet Rows     Most Recent Value  Intake Tab  Referral  Department Hospitalist  Unit at Time of Referral Cardiac/Telemetry Unit  Palliative Care Primary Diagnosis Nephrology  Date Notified 12/25/19  Palliative Care Type New Palliative care  Reason for referral Counsel Regarding Hospice  Date of Admission 12/24/19  Date first seen by Palliative Care 12/25/19  # of days Palliative referral response time 0 Day(s)  # of days IP prior to Palliative referral 1  Clinical Assessment  Palliative Performance Scale Score 30%  Pain Max last 24 hours Not able to report  Pain Min Last 24 hours Not able to report  Dyspnea Max Last 24 Hours Not able to report  Dyspnea Min Last 24 hours Not able to report  Psychosocial & Spiritual Assessment  Palliative Care Outcomes      Time In: 1410 Time Out: 1500 Time Total: 50 minutes  Greater than 50%  of this time was spent counseling and coordinating care related to the above assessment and plan.  Signed by: Drue Novel, NP   Please contact Palliative Medicine Team phone at [phone] for questions and concerns.  For individual provider: See Shea Evans

## 2019-12-25 NOTE — Telephone Encounter (Signed)
Telephone call from patient's niece [name].  Patient was scheduled to meet with RN this afternoon.  [name] reports patient was admitted to [facility] yesterday.  [name] to let RN know when patient returns home.  Palliative care Admin Team informed.

## 2019-12-25 NOTE — Therapy (Signed)
PT Cancellation Note  Patient Details Name: [name] MRN: [id number] DOB: January 01, 1931   Cancelled Treatment:    Reason Eval/Treat Not Completed: Patient not medically ready;Other (comment) (palliative care/hospice ) Per chart review, patient is transitioning to hospice care. PT signing off at this time. Please re-consult if needed.    1:07 PM, 12/25/19 Mearl Latin PT, DPT Physical Therapist at [facility]

## 2019-12-25 NOTE — Progress Notes (Signed)
[facility] ([facility]) Community Based Palliative Care       This patient is enrolled in our palliative care services in the community.  [facility] will continue to follow for any discharge planning needs and to coordinate continuation of palliative care.   If you have questions or need assistance, please call [phone] or contact the hospital Liaison listed on AMION.     Thank you for the opportunity to participate in this patient's care.     Chrislyn King, BSN, RN [facility] Hospital Liaison   [phone]   

## 2019-12-25 NOTE — ED Notes (Signed)
Report called to [place], Therapist, sports on [phone]

## 2019-12-25 NOTE — Progress Notes (Signed)
*  PRELIMINARY RESULTS* Echocardiogram 2D Echocardiogram has been performed.  Leavy Cella 12/25/2019, 9:12 AM

## 2019-12-25 NOTE — Telephone Encounter (Signed)
Spoke with Nira Conn at [facility]. Advised attempted to schedule pt on yesterday and today, but have noted pt has still been at [facility] since 8/16. Will attempt to reach pt post discharge. Heather verbalized understanding.

## 2019-12-25 NOTE — TOC Initial Note (Signed)
Transition of Care [facility]) - Initial/Assessment Note   Patient Details  Name: [name] MRN: [id number] Date of Birth: August 20, 1930  Transition of Care [facility]) CM/SW Contact:    Sherie Don, LCSW Phone Number: 12/25/2019, 3:02 PM  Clinical Narrative: Patient is an 86 year old male who was admitted for generalized weakness. Re-admission checklist completed due to high re-admission score. CSW spoke with patient's niece, [name]. Per niece, she is the patient's durable POA, not his legal guardian. Niece reported the patient is active with [facility] OP palliative, but they have not been out to the home yet as he was admitted yesterday. Niece reported the patient wants to wait until he gets the results of his MRI and other tests before he decides if he wants to stop dialysis and other treatments. TOC to follow.  Expected Discharge Plan: Home/Self Care Barriers to Discharge: Continued Medical Work up  Patient Goals and CMS Choice Patient states their goals for this hospitalization and ongoing recovery are:: Discharge home with OP palliative CMS Medicare.gov Compare Post Acute Care list provided to:: Patient Represenative (must comment)  Expected Discharge Plan and Services Expected Discharge Plan: Home/Self Care In-house Referral: Clinical Social Work, Hospice / Palliative Care Discharge Planning Services: NA Living arrangements for the past 2 months: Single Family Home  Prior Living Arrangements/Services Living arrangements for the past 2 months: Single Family Home Patient language and need for interpreter reviewed:: Yes Do you feel safe going back to the place where you live?: Yes      Need for Family Participation in Patient Care: Yes (Comment) (Patient has difficulty hearing) Care giver support system in place?: Yes (comment) Current home services: DME (Rolling walker, wheelchair) Criminal Activity/Legal Involvement Pertinent to Current Situation/Hospitalization: No - Comment  as needed  Permission Sought/Granted Permission sought to share information with : Family Supports Share Information with NAME: [name] (niece) Permission granted to share info w AGENCY: [facility] for OP palliative care  Emotional Assessment Appearance:: Appears stated age Orientation: : Oriented to Self, Oriented to Place, Oriented to  Time, Oriented to Situation Alcohol / Substance Use: Tobacco Use Psych Involvement: No (comment)  Admission diagnosis:  Generalized weakness [R53.1] Patient Active Problem List   Diagnosis Date Noted  . AF (paroxysmal atrial fibrillation) ([place]) 12/25/2019  . Generalized weakness 12/24/2019  . Bronchoalveolar carcinoma ([place]) 11/23/2019  . Renal cell carcinoma ([place]) 11/23/2019  . Palliative care by specialist   . Goals of care, counseling/discussion   . DNR (do not resuscitate)   . Metastasis to brain ([place]) 11/21/2019  . Seizure ([place]) 11/21/2019  . Renal cancer ([place]) 10/04/2019  . Periprosthetic fracture around internal prosthetic right hip joint ([place]) 10/04/2019 10/04/2019  . Community acquired pneumonia 05/30/2017  . Influenza A 05/30/2017  . BPH (benign prostatic hypertrophy) 05/30/2017  . COPD (chronic obstructive pulmonary disease) ([place]) 05/30/2017  . Hypertension 05/30/2017  . GERD (gastroesophageal reflux disease) 05/30/2017  . Pulmonary nodules/lesions, multiple 09/14/2016  . Bowel habit changes 09/11/2015  . Malignant neoplasm of right upper lobe of lung ([place]) 10/27/2012  . Visit for wound check 10/09/2012  . Swollen arm 09/15/2012  . Encounter for adequacy testing for hemodialysis ([place]) 09/01/2012  . Aftercare following surgery of the circulatory system, [place] 08/18/2012  . Other complications due to renal dialysis device, implant, and graft 04/19/2012  . End stage renal disease ([place]) 07/14/2011  . Pre-operative cardiovascular examination 04/07/2011   PCP:  Sharilyn Sites, MD Pharmacy:   [facility], [place] -  [address] [place] [address] Phone: [phone] Fax: [place], [address] [place] [address] Phone: [phone] Fax: [phone]  Readmission Risk Interventions Readmission Risk Prevention Plan 12/25/2019 11/27/2019  Transportation Screening Complete Complete  PCP or Specialist Appt within 3-5 Days Not Complete -  Not Complete comments Awaiting results for MRI and tests to determine what the patient wants to do regarding his care -  [place] or Home Care Consult Complete Complete  Social Work Consult for [place] Planning/Counseling Complete Complete  Palliative Care Screening Complete Not Applicable  Medication Review Press photographer) - Complete  Some recent data might be hidden

## 2019-12-25 NOTE — Consult Note (Signed)
[name] Admit Date: 12/24/2019 12/25/2019 Rexene Agent Requesting Physician:  Reesa Chew MD  Reason for Consult:  ESRD Comanagement HPI:  86 year old male ESRD on HD MWF via left arm AV fistula, with [facility] in [place] admitted overnight after presenting with progressive weakness, more so on the left than the right.  This is in the setting of metastatic lung cancer with recent findings of metastases to the brain and currently undergoing palliative radiation.  Other comorbidities include COPD, history of renal cell carcinoma with nephrectomy, hypertension, paroxysmal atrial fibrillation.  He became increasingly weak after dialysis yesterday.  Work-up thus far has included a CT scan without acute finding.  MRI is pending.  Neurology has been consulted.  Vital signs are fairly stable, blood pressure is normal.  He is on room air.  Labs are notable for potassium of 4.8, bicarbonate of 24.  Hemoglobin is 10.7.  At the time of my evaluation the patient is awake, alert, conversant.  No other family was available.  We discussed his progressive metastatic disease and the difficulties and complications that are occurring including frequent medical visits, ED visits, hospitalizations.  He is following with palliative care.  He clearly expressed to me he is ready to transition over to hospice, he is ready to discontinue dialysis.   ROS Balance of 12 systems is negative w/ exceptions as above  PMH  Past Medical History:  Diagnosis Date  . Arthritis    ankle , hip, knees , low back   . BPH (benign prostatic hypertrophy)   . Cancer ([place])    lung  . Cancer ([place]) 2001   renal  . 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 /w prednisone x 17 yrs. has been d/c for 3 yrs.   . Chronic kidney disease       . CIDP (chronic inflammatory demyelinating polyneuropathy) ([place])   . COPD (chronic obstructive pulmonary disease) ([place])    told that he has "a bit of  emphysema"  . Dialysis patient ([place])   . GERD (gastroesophageal reflux disease)   . Hypertension    stress test done - many years ago  . Kidney calculus   . Pneumonia    PSH  Past Surgical History:  Procedure Laterality Date  . ANKLE FUSION     R ankle  . AV FISTULA PLACEMENT  06/01/2011   Procedure: ARTERIOVENOUS (AV) FISTULA CREATION;  Surgeon: Angelia Mould, MD;  Location: [facility] OR;  Service: Vascular;  Laterality: Left;  Creation of Left Arteriovenous fistula  . AV FISTULA PLACEMENT Left 07/12/2012   Procedure: ARTERIOVENOUS (AV) FISTULA CREATION;  Surgeon: Conrad , MD;  Location: [facility];  Service: Vascular;  Laterality: Left;  . AV FISTULA PLACEMENT Left 4.11.14  . BASCILIC VEIN TRANSPOSITION Left 08/28/2012   Procedure: BASCILIC VEIN TRANSPOSITION;  Surgeon: Conrad , MD;  Location: [facility];  Service: Vascular;  Laterality: Left;  left 2nd stage basilic vein transposition  . CHOLECYSTECTOMY    . COLONOSCOPY  2005   Dr. Laural Golden, diverticulosis, hemorrhoids, 2 small polyps  . FISTULOGRAM N/A 08/16/2011   Procedure: FISTULOGRAM;  Surgeon: Angelia Mould, MD;  Location[facility] CATH LAB;  Service: Cardiovascular;  Laterality: N/A;  . fistulogram left radial cephalic AV fistula  [id number]     Dr. Scot Dock  . HIP ARTHROPLASTY Right    "partial"  . JOINT REPLACEMENT     L knee- 2010- [facility], L hip partial replacement   . LUNG REMOVAL,  PARTIAL  2003   partial, right  . NEPHRECTOMY  2001   left  . SHUNTOGRAM N/A 05/15/2012   Procedure: Earney Mallet;  Surgeon: Conrad [place], MD;  Location: [facility] CATH LAB;  Service: Cardiovascular;  Laterality: N/A;  . SHUNTOGRAM N/A 05/09/2014   Procedure: FISTULOGRAM;  Surgeon: Conrad [place], MD;  Location: [facility] CATH LAB;  Service: Cardiovascular;  Laterality: N/A;   FH  Family History  Problem Relation Age of Onset  . Heart disease Mother   . Heart disease Father   . Heart attack Father   . Anesthesia problems Neg Hx   . Hypotension Neg Hx   .  Malignant hyperthermia Neg Hx   . Pseudochol deficiency Neg Hx   . Colon cancer Neg Hx    SH  reports that he quit smoking about 42 years ago. His smoking use included cigarettes. He quit after 35.00 years of use. His smokeless tobacco use includes chew. He reports that he does not drink alcohol and does not use drugs. Allergies No Known Allergies Home medications Prior to Admission medications   Medication Sig Start Date End Date Taking? Authorizing Provider  acetaminophen (TYLENOL) 325 MG tablet Take 2 tablets (650 mg total) by mouth every 6 (six) hours as needed for mild pain or moderate pain. 10/05/19  Yes Kathie Dike, MD  dexamethasone (DECADRON) 4 MG tablet Take 1 tablet (4 mg total) by mouth every 8 (eight) hours. 11/27/19 12/27/19 Yes Arrien, Jimmy Picket, MD  Fluticasone-Salmeterol (ADVAIR) 100-50 MCG/DOSE AEPB Inhale 1 puff into the lungs in the morning and at bedtime.  09/04/19  Yes [provider]  levETIRAcetam (KEPPRA) 500 MG tablet Take 1 tablet (500 mg total) by mouth 2 (two) times daily. 11/27/19 12/27/19 Yes Arrien, Jimmy Picket, MD  lidocaine-prilocaine (EMLA) cream Apply 1 application topically every Monday, Wednesday, and Friday with hemodialysis.  12/17/14  Yes [provider]  metoprolol succinate (TOPROL-XL) 25 MG 24 hr tablet Take 25 mg by mouth daily.   Yes [provider]  omeprazole (PRILOSEC) 20 MG capsule Take 20 mg by mouth daily.   Yes [provider]  oxycodone (OXY-IR) 5 MG capsule Take 5 mg by mouth every 4 (four) hours as needed for pain.   Yes [provider]  Sennosides-Docusate Sodium 8.6-50 MG CAPS Take 1 capsule by mouth in the morning and at bedtime.   Yes [provider]  VENTOLIN HFA 108 (90 Base) MCG/ACT inhaler Inhale 2 puffs into the lungs every 4 (four) hours as needed for wheezing or shortness of breath.  09/04/19  Yes [provider]  cinacalcet (SENSIPAR) 60 MG tablet Take 60 mg by  mouth daily. Patient not taking: Reported on 11/29/2019    [provider]    Current Medications Scheduled Meds: . dexamethasone  4 mg Oral Q8H  . levETIRAcetam  500 mg Oral BID  . metoprolol succinate  25 mg Oral Daily  . mometasone-formoterol  2 puff Inhalation BID  . pantoprazole  40 mg Oral Daily   Continuous Infusions: PRN Meds:.acetaminophen **OR** acetaminophen, albuterol, oxyCODONE, polyethylene glycol, senna-docusate  CBC Recent Labs  Lab 12/24/19 1516 12/25/19 0549  WBC 5.5 5.1  NEUTROABS 4.5  --   HGB 11.3* 10.7*  HCT 35.0* 34.3*  MCV 102.0* 104.9*  PLT 84* 53*   Basic Metabolic Panel Recent Labs  Lab 12/24/19 1516 12/25/19 0549  NA 135 133*  K 4.9 4.8  CL 98 96*  CO2 24 24  GLUCOSE 120* 115*  BUN 59* 69*  CREATININE 5.51* 6.58*  CALCIUM 7.7* 7.9*  PHOS  --  5.8*    Physical Exam  Blood pressure 138/64, pulse (!) 103, temperature 98.4 F (36.9 C), temperature source Oral, resp. rate 16, height 5\' 6"  (1.676 m), weight 81.6 kg, SpO2 94 %. GEN: Chronically ill-appearing, NAD, conversant, awake and alert ENT: NCAT, fair dentition EYES: EOMI CV: Regular, no rub PULM: Coarse breath sounds bilaterally, normal work of breathing ABD: Mildly obese, no tenderness, soft SKIN: No rashes or lesions, left arm AV fistula with bruit and thrill EXT: No significant edema   Assessment 50M ESRD with stage IV lung cancer including recent diagnosis of brain metastases presenting with progressive failure to thrive and weakness, especially of the left side.  1. ESRD, MWF, [facility], left arm AV fistula 2. Stage IV metastatic lung cancer, on palliative radiation for recent diagnosis of brain metastases 3. Failure to thrive, related to #1 and #2; patient clearly states is ready to transition to hospice and discontinue dialysis 4. COPD 5. Hypertension 6. Paroxysmal atrial fibrillation  Plan 1. Discussed with hospitalist, tentative plan will be for  transition to hospice after further discussion with family 2. Will follow up again tomorrow, as of right now no further dialysis will be planned   Rexene Agent  12/25/2019, 9:54 AM

## 2019-12-25 NOTE — Progress Notes (Signed)
PROGRESS NOTE    CLAYVON PARLETT  YNW:[id number] DOB: December 03, 1930 DOA: 12/24/2019 PCP: Sharilyn Sites, MD   Brief Narrative:  86 year old with history of ESRD on hemodialysis Monday Wednesday Friday, COPD, bronchoalveolar carcinoma with metastatic disease, renal cell carcinoma status post left nephrectomy, HTN, right greater trochanteric fracture, GERD came to the ED with his niece for generalized weakness left greater than right.  On outpatient getting palliative chemo/radiation for brain metastases, on Decadron and Keppra.  In the ED noted to be clinically dehydrated, tachycardic, tachypneic.   Assessment & Plan:   Active Problems:   End stage renal disease (HCC)   Swollen arm   COPD (chronic obstructive pulmonary disease) (HCC)   GERD (gastroesophageal reflux disease)   Metastasis to brain (HCC)   Generalized weakness   AF (paroxysmal atrial fibrillation) (HCC)  Generalized weakness left greater than right -CT of the head is negative for any acute changes.  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 Benay Spice.  On Keppra and Decadron given metastatic disease to the brain  GERD -PPI  Essential hypertension -Metoprolol  Anemia of chronic disease, macrocytosis -Hemoglobin remained stable.  No evidence of bleeding  History of COPD -As needed bronchodilators  Goals of care conversation-palliative care consulted.  I had extensive discussion with the patient's healthcare power of  attorney and the patient himself.  Maintain and continue routine care but remains DNR/DNI     DVT prophylaxis: SCDs Code Status: DNR Family Communication: Patient's niece, healthcare power of attorney at bedside  Status is: Inpatient  Remains inpatient appropriate because:Inpatient level of care appropriate due to severity of illness   Dispo: The patient is from: Home              Anticipated d/c is to: Home              Anticipated d/c date is: 1 day              Patient currently is not medically stable to d/c.  Ongoing evaluation for left-sided weakness       Body mass index is 29.05 kg/m.        Subjective: Patient seen and examined at bedside.  Healthcare power of attorney, niece is also at bedside.  They're very clear at this time they would like to continue routine care, maintain DNR/DNI.  Also would like to remain on dialysis until his fistula has failed to work as he seems to be doing okay overall.  He is aware that his cancer is not curable.  Review of Systems Otherwise negative except as per HPI, including: General: Denies fever, chills, night sweats or unintended weight loss. Resp: Denies cough, wheezing, shortness of breath. Cardiac: Denies chest pain, palpitations, orthopnea, paroxysmal nocturnal dyspnea. GI: Denies abdominal pain, nausea, vomiting, diarrhea or constipation GU: Denies dysuria, frequency, hesitancy or incontinence MS: Denies muscle aches, joint pain or swelling Neuro: Denies headache, neurologic deficits (focal weakness, numbness, tingling), abnormal gait Psych: Denies anxiety, depression, SI/HI/AVH Skin: Denies new rashes or lesions ID: Denies sick contacts, exotic exposures, travel  Examination: Constitutional: Not in acute distress Respiratory: Clear to auscultation bilaterally Cardiovascular:  Normal sinus rhythm, no rubs Abdomen: Nontender nondistended good bowel sounds Musculoskeletal: No edema noted Skin: Left upper extremity  fistula noted, no evidence of bleeding but mild swelling Neurologic: CN 2-12 grossly intact.  And nonfocal Psychiatric: Alert to name and place  Objective: Vitals:   12/25/19 0500 12/25/19 0600 12/25/19 0806 12/25/19 0808  BP: (!) 111/59 124/67 138/64   Pulse: (!) 103     Resp: (!) 22   16  Temp:      TempSrc:      SpO2: 94%     Weight:      Height:        Intake/Output Summary (Last 24 hours) at 12/25/2019 0859 Last data filed at 12/24/2019 2209 Gross per 24 hour  Intake 100 ml  Output --  Net 100 ml   Filed Weights   12/24/19 1156  Weight: 81.6 kg     Data Reviewed:   CBC: Recent Labs  Lab 12/24/19 1516 12/25/19 0549  WBC 5.5 5.1  NEUTROABS 4.5  --   HGB 11.3* 10.7*  HCT 35.0* 34.3*  MCV 102.0* 104.9*  PLT 84* 53*   Basic Metabolic Panel: Recent Labs  Lab 12/24/19 1516 12/25/19 0549  NA 135 133*  K 4.9 4.8  CL 98 96*  CO2 24 24  GLUCOSE 120* 115*  BUN 59* 69*  CREATININE 5.51* 6.58*  CALCIUM 7.7* 7.9*  MG  --  2.1  PHOS  --  5.8*   GFR: Estimated Creatinine Clearance: 7.9 mL/min (A) (by C-G formula based on SCr of 6.58 mg/dL (H)). Liver Function Tests: Recent Labs  Lab 12/24/19 1516 12/25/19 0549  AST 13* 9*  ALT 19 17  ALKPHOS 76 70  BILITOT 0.8 0.8  PROT 5.9* 5.5*  ALBUMIN 3.0* 2.8*   Recent Labs  Lab 12/24/19 1516  LIPASE 69*   No results for input(s): AMMONIA in the last 168 hours. Coagulation Profile: Recent Labs  Lab 12/24/19 2132 12/25/19 0549  INR 1.0 1.0   Cardiac Enzymes: No results for input(s): CKTOTAL, CKMB, CKMBINDEX, TROPONINI in the last 168 hours. BNP (last 3 results) No results for input(s): PROBNP in the last 8760 hours. HbA1C: No results for input(s): HGBA1C in the last 72 hours. CBG: Recent Labs  Lab 12/24/19 1515  GLUCAP 120*   Lipid Profile: No results for input(s): CHOL, HDL, LDLCALC, TRIG, CHOLHDL, LDLDIRECT in the last 72 hours. Thyroid Function Tests: No results for input(s): TSH, T4TOTAL,  FREET4, T3FREE, THYROIDAB in the last 72 hours. Anemia Panel: No results for input(s): VITAMINB12, FOLATE, FERRITIN, TIBC, IRON, RETICCTPCT in the last 72 hours. Sepsis Labs: No results for input(s): PROCALCITON, LATICACIDVEN in the last 168 hours.  No results found for this or any previous visit (from the past 240 hour(s)).       Radiology Studies: DG Chest 2 View  Result Date: 12/24/2019 CLINICAL DATA:  86 year old male with weakness and fatigue. EXAM: CHEST - 2 VIEW COMPARISON:  Chest radiograph dated 05/30/2017. FINDINGS: There is a small left pleural effusion and left lung base patchy opacity which may represent atelectasis but concerning for pneumonia. Clinical correlation recommended. The right lung is clear. No pneumothorax. Stable cardiac silhouette. Atherosclerotic calcification of the aorta. No acute osseous pathology. Degenerative changes of the spine. IMPRESSION: Small left pleural effusion and left lung base patchy opacity concerning for pneumonia. Electronically Signed   By: Anner Crete M.D.   On: 12/24/2019 18:53   CT Head Wo Contrast  Result Date: 12/24/2019 CLINICAL  DATA:  Has history of brain cancer and had a treatment 2 weeks ago, according to daughter he has been getting weaker on the left side, he had pain down his left side to his buttock. Patient took an oxycodone at 1am and it relieved his pain. Patient took another before dialysis. Getting into car patient was unable to lift his left leg on his own. EXAM: CT HEAD WITHOUT CONTRAST TECHNIQUE: Contiguous axial images were obtained from the base of the skull through the vertex without intravenous contrast. COMPARISON:  11/21/2019, head CT and brain MRI. Brain MRI, 11/27/2019. FINDINGS: Brain: Low-attenuation reflecting vasogenic edema extends throughout the right parietal lobe involving the adjacent posterior right frontal lobe and involves a smaller area of the left frontal lobe. This is unchanged compared to the prior  head CT. There is slight, 1-2 mm, midline shift of the posterosuperior falx cerebri, also stable. The metastatic lesions which were noted in the right parietal lobe, left frontal lobe and superior right cerebellum are not defined on CT. There are no new areas of vasogenic edema to suggest new metastatic lesions. There is no hydrocephalus. There is no evidence of acute/recent infarction. There are no extra-axial masses or abnormal fluid collections. There is no intracranial hemorrhage. Additional white matter hypoattenuation is noted bilaterally consistent with chronic microvascular ischemic change. Vascular: No hyperdense vessel or unexpected calcification. Skull: Normal. Negative for fracture or focal lesion. Sinuses/Orbits: Globes and orbits are unremarkable. The visualized sinuses show mild maxillary sinus mucosal thickening but are otherwise clear. Other: None. IMPRESSION: 1. No interval change in the head CT appearance from the study dated 11/21/2019. The right parietal and left frontal lobe metastatic lesions are not defined, but the significant vasogenic edema is well depicted and appears unchanged in extent. There is minimal stable mass effect from the right parietal lesion slightly deviating the superior falx cerebri to the left. Electronically Signed   By: Lajean Manes M.D.   On: 12/24/2019 15:36        Scheduled Meds: . dexamethasone  4 mg Oral Q8H  . levETIRAcetam  500 mg Oral BID  . metoprolol succinate  25 mg Oral Daily  . mometasone-formoterol  2 puff Inhalation BID  . pantoprazole  40 mg Oral Daily   Continuous Infusions:   LOS: 1 day   Time spent= 35 mins    Jazen Spraggins Arsenio Loader, MD [facility]  If 7PM-7AM, please contact night-coverage  12/25/2019, 8:59 AM

## 2019-12-26 ENCOUNTER — Inpatient Hospital Stay (HOSPITAL_COMMUNITY)
Admission: RE | Admit: 2019-12-26 | Discharge: 2019-12-30 | DRG: 180 | Disposition: A | Discharge status: Hospice | Source: Hospice | Attending: Internal Medicine | Admitting: Internal Medicine

## 2019-12-26 DIAGNOSIS — Z85528 Personal history of other malignant neoplasm of kidney: Secondary | ICD-10-CM

## 2019-12-26 DIAGNOSIS — R404 Transient alteration of awareness: Secondary | ICD-10-CM | POA: Diagnosis not present

## 2019-12-26 DIAGNOSIS — D631 Anemia in chronic kidney disease: Secondary | ICD-10-CM | POA: Diagnosis present

## 2019-12-26 DIAGNOSIS — R531 Weakness: Secondary | ICD-10-CM | POA: Diagnosis not present

## 2019-12-26 DIAGNOSIS — Z66 Do not resuscitate: Secondary | ICD-10-CM | POA: Diagnosis present

## 2019-12-26 DIAGNOSIS — N186 End stage renal disease: Secondary | ICD-10-CM | POA: Diagnosis present

## 2019-12-26 DIAGNOSIS — Z515 Encounter for palliative care: Secondary | ICD-10-CM | POA: Diagnosis not present

## 2019-12-26 DIAGNOSIS — Z20822 Contact with and (suspected) exposure to covid-19: Secondary | ICD-10-CM | POA: Diagnosis present

## 2019-12-26 DIAGNOSIS — Z7189 Other specified counseling: Secondary | ICD-10-CM | POA: Diagnosis not present

## 2019-12-26 DIAGNOSIS — Z8249 Family history of ischemic heart disease and other diseases of the circulatory system: Secondary | ICD-10-CM

## 2019-12-26 DIAGNOSIS — C3490 Malignant neoplasm of unspecified part of unspecified bronchus or lung: Secondary | ICD-10-CM | POA: Diagnosis not present

## 2019-12-26 DIAGNOSIS — Z981 Arthrodesis status: Secondary | ICD-10-CM

## 2019-12-26 DIAGNOSIS — M1990 Unspecified osteoarthritis, unspecified site: Secondary | ICD-10-CM | POA: Diagnosis not present

## 2019-12-26 DIAGNOSIS — R4182 Altered mental status, unspecified: Secondary | ICD-10-CM | POA: Diagnosis not present

## 2019-12-26 DIAGNOSIS — Z85841 Personal history of malignant neoplasm of brain: Secondary | ICD-10-CM

## 2019-12-26 DIAGNOSIS — Z905 Acquired absence of kidney: Secondary | ICD-10-CM | POA: Diagnosis not present

## 2019-12-26 DIAGNOSIS — I480 Paroxysmal atrial fibrillation: Secondary | ICD-10-CM | POA: Diagnosis not present

## 2019-12-26 DIAGNOSIS — C7931 Secondary malignant neoplasm of brain: Secondary | ICD-10-CM | POA: Diagnosis present

## 2019-12-26 DIAGNOSIS — J449 Chronic obstructive pulmonary disease, unspecified: Secondary | ICD-10-CM | POA: Diagnosis present

## 2019-12-26 DIAGNOSIS — I4891 Unspecified atrial fibrillation: Secondary | ICD-10-CM | POA: Diagnosis not present

## 2019-12-26 DIAGNOSIS — Z992 Dependence on renal dialysis: Secondary | ICD-10-CM | POA: Diagnosis not present

## 2019-12-26 DIAGNOSIS — Z87442 Personal history of urinary calculi: Secondary | ICD-10-CM | POA: Diagnosis not present

## 2019-12-26 DIAGNOSIS — Z7401 Bed confinement status: Secondary | ICD-10-CM | POA: Diagnosis not present

## 2019-12-26 DIAGNOSIS — N400 Enlarged prostate without lower urinary tract symptoms: Secondary | ICD-10-CM | POA: Diagnosis not present

## 2019-12-26 DIAGNOSIS — I120 Hypertensive chronic kidney disease with stage 5 chronic kidney disease or end stage renal disease: Secondary | ICD-10-CM | POA: Diagnosis not present

## 2019-12-26 DIAGNOSIS — R0602 Shortness of breath: Secondary | ICD-10-CM | POA: Diagnosis not present

## 2019-12-26 DIAGNOSIS — Z8673 Personal history of transient ischemic attack (TIA), and cerebral infarction without residual deficits: Secondary | ICD-10-CM

## 2019-12-26 DIAGNOSIS — I10 Essential (primary) hypertension: Secondary | ICD-10-CM | POA: Diagnosis not present

## 2019-12-26 DIAGNOSIS — N12 Tubulo-interstitial nephritis, not specified as acute or chronic: Secondary | ICD-10-CM | POA: Diagnosis present

## 2019-12-26 DIAGNOSIS — D7589 Other specified diseases of blood and blood-forming organs: Secondary | ICD-10-CM | POA: Diagnosis present

## 2019-12-26 DIAGNOSIS — K219 Gastro-esophageal reflux disease without esophagitis: Secondary | ICD-10-CM | POA: Diagnosis not present

## 2019-12-26 LAB — BASIC METABOLIC PANEL
Anion gap: 13 (ref 5–15)
BUN: 94 mg/dL — ABNORMAL HIGH (ref 8–23)
CO2: 21 mmol/L — ABNORMAL LOW (ref 22–32)
Calcium: 8.1 mg/dL — ABNORMAL LOW (ref 8.9–10.3)
Chloride: 96 mmol/L — ABNORMAL LOW (ref 98–111)
Creatinine, Ser: 8.19 mg/dL — ABNORMAL HIGH (ref 0.61–1.24)
GFR calc Af Amer: 6 mL/min — ABNORMAL LOW (ref >60)
GFR calc non Af Amer: 5 mL/min — ABNORMAL LOW (ref >60)
Glucose, Bld: 180 mg/dL — ABNORMAL HIGH (ref 70–99)
Potassium: 5.8 mmol/L — ABNORMAL HIGH (ref 3.5–5.1)
Sodium: 130 mmol/L — ABNORMAL LOW (ref 135–145)

## 2019-12-26 LAB — CBC
HCT: 29.1 % — ABNORMAL LOW (ref 39.0–52.0)
Hemoglobin: 9.3 g/dL — ABNORMAL LOW (ref 13.0–17.0)
MCH: 32.4 pg (ref 26.0–34.0)
MCHC: 32 g/dL (ref 30.0–36.0)
MCV: 101.4 fL — ABNORMAL HIGH (ref 80.0–100.0)
Platelets: 68 10*3/uL — ABNORMAL LOW (ref 150–400)
RBC: 2.87 MIL/uL — ABNORMAL LOW (ref 4.22–5.81)
RDW: 14.9 % (ref 11.5–15.5)
WBC: 4.4 10*3/uL (ref 4.0–10.5)
nRBC: 0 % (ref 0.0–0.2)

## 2019-12-26 LAB — MAGNESIUM: Magnesium: 2.2 mg/dL (ref 1.7–2.4)

## 2019-12-26 MED ORDER — ALBUTEROL SULFATE (2.5 MG/3ML) 0.083% IN NEBU: 3.0000 mL | INHALATION_SOLUTION | RESPIRATORY_TRACT | Status: DC | PRN

## 2019-12-26 MED ORDER — SODIUM CHLORIDE 0.9 % IV SOLN: 100.0000 mL | INTRAVENOUS | Status: DC | PRN

## 2019-12-26 MED ORDER — BIOTENE DRY MOUTH MT LIQD: 15.0000 mL | OROMUCOSAL | Status: DC | PRN

## 2019-12-26 MED ORDER — GLYCOPYRROLATE 0.2 MG/ML IJ SOLN: 0.2000 mg | INTRAMUSCULAR | Status: DC | PRN

## 2019-12-26 MED ORDER — HALOPERIDOL LACTATE 2 MG/ML PO CONC: 0.5000 mg | ORAL | Status: DC | PRN

## 2019-12-26 MED ORDER — ARFORMOTEROL TARTRATE 15 MCG/2ML IN NEBU
15.0000 ug | INHALATION_SOLUTION | Freq: Two times a day (BID) | RESPIRATORY_TRACT | Status: DC
  Administered 2019-12-27 – 2019-12-30 (×6): 15 ug via RESPIRATORY_TRACT
  Filled 2019-12-26 (×8): qty 2

## 2019-12-26 MED ORDER — CHLORHEXIDINE GLUCONATE CLOTH 2 % EX PADS
6.0000 | MEDICATED_PAD | Freq: Every day | CUTANEOUS | Status: DC
  Administered 2019-12-26: 6 via TOPICAL

## 2019-12-26 MED ORDER — OXYCODONE HCL 5 MG PO CAPS: 5.0000 mg | ORAL_CAPSULE | ORAL | 0 refills | Status: AC | PRN

## 2019-12-26 MED ORDER — ALPRAZOLAM 0.5 MG PO TABS: 0.5000 mg | ORAL_TABLET | Freq: Three times a day (TID) | ORAL | 0 refills | Status: AC | PRN

## 2019-12-26 MED ORDER — ACETAMINOPHEN 650 MG RE SUPP: 650.0000 mg | Freq: Four times a day (QID) | RECTAL | Status: DC | PRN

## 2019-12-26 MED ORDER — SENNOSIDES-DOCUSATE SODIUM 8.6-50 MG PO TABS
1.0000 | ORAL_TABLET | Freq: Two times a day (BID) | ORAL | Status: DC
  Administered 2019-12-27 – 2019-12-29 (×7): 1 via ORAL
  Filled 2019-12-26 (×8): qty 1

## 2019-12-26 MED ORDER — PENTAFLUOROPROP-TETRAFLUOROETH EX AERO: 1.0000 "application " | INHALATION_SPRAY | CUTANEOUS | Status: DC | PRN

## 2019-12-26 MED ORDER — CEPHALEXIN 500 MG PO CAPS
500.0000 mg | ORAL_CAPSULE | Freq: Two times a day (BID) | ORAL | Status: DC
  Administered 2019-12-27 – 2019-12-29 (×7): 500 mg via ORAL
  Filled 2019-12-26 (×8): qty 1

## 2019-12-26 MED ORDER — LIDOCAINE-PRILOCAINE 2.5-2.5 % EX CREA: 1.0000 "application " | TOPICAL_CREAM | CUTANEOUS | Status: DC

## 2019-12-26 MED ORDER — CEPHALEXIN 500 MG PO CAPS: 500.0000 mg | ORAL_CAPSULE | Freq: Four times a day (QID) | ORAL | 0 refills | Status: AC

## 2019-12-26 MED ORDER — BUDESONIDE 0.25 MG/2ML IN SUSP
0.2500 mg | Freq: Two times a day (BID) | RESPIRATORY_TRACT | Status: DC
  Administered 2019-12-27 – 2019-12-30 (×6): 0.25 mg via RESPIRATORY_TRACT
  Filled 2019-12-26 (×7): qty 2

## 2019-12-26 MED ORDER — ACETAMINOPHEN 325 MG PO TABS
650.0000 mg | ORAL_TABLET | Freq: Four times a day (QID) | ORAL | Status: DC | PRN
  Administered 2019-12-27 – 2019-12-29 (×4): 650 mg via ORAL
  Filled 2019-12-26 (×3): qty 2

## 2019-12-26 MED ORDER — LIDOCAINE-PRILOCAINE 2.5-2.5 % EX CREA: 1.0000 "application " | TOPICAL_CREAM | CUTANEOUS | Status: DC | PRN

## 2019-12-26 MED ORDER — ONDANSETRON HCL 4 MG/2ML IJ SOLN: 4.0000 mg | Freq: Four times a day (QID) | INTRAMUSCULAR | Status: DC | PRN

## 2019-12-26 MED ORDER — LIDOCAINE HCL (PF) 1 % IJ SOLN: 5.0000 mL | INTRAMUSCULAR | Status: DC | PRN

## 2019-12-26 MED ORDER — HALOPERIDOL 0.5 MG PO TABS: 0.5000 mg | ORAL_TABLET | ORAL | Status: DC | PRN

## 2019-12-26 MED ORDER — HALOPERIDOL LACTATE 5 MG/ML IJ SOLN: 0.5000 mg | INTRAMUSCULAR | Status: DC | PRN

## 2019-12-26 MED ORDER — POLYVINYL ALCOHOL 1.4 % OP SOLN: 1.0000 [drp] | Freq: Four times a day (QID) | OPHTHALMIC | Status: DC | PRN

## 2019-12-26 MED ORDER — OXYCODONE HCL 5 MG PO TABS
5.0000 mg | ORAL_TABLET | ORAL | Status: DC | PRN
  Administered 2019-12-27 – 2019-12-28 (×3): 5 mg via ORAL
  Filled 2019-12-26 (×3): qty 1

## 2019-12-26 MED ORDER — LEVETIRACETAM 500 MG PO TABS
500.0000 mg | ORAL_TABLET | Freq: Two times a day (BID) | ORAL | Status: DC
  Administered 2019-12-27 – 2019-12-29 (×7): 500 mg via ORAL
  Filled 2019-12-26 (×8): qty 1

## 2019-12-26 MED ORDER — PANTOPRAZOLE SODIUM 40 MG PO TBEC
40.0000 mg | DELAYED_RELEASE_TABLET | Freq: Every day | ORAL | Status: DC
  Administered 2019-12-27 – 2019-12-29 (×3): 40 mg via ORAL
  Filled 2019-12-26 (×4): qty 1

## 2019-12-26 MED ORDER — MOMETASONE FURO-FORMOTEROL FUM 100-5 MCG/ACT IN AERO
2.0000 | INHALATION_SPRAY | Freq: Two times a day (BID) | RESPIRATORY_TRACT | Status: DC
Start: 2019-12-26 — End: 2019-12-26

## 2019-12-26 MED ORDER — ALPRAZOLAM 0.5 MG PO TABS: 0.5000 mg | ORAL_TABLET | Freq: Three times a day (TID) | ORAL | Status: DC | PRN

## 2019-12-26 MED ORDER — METOPROLOL SUCCINATE ER 25 MG PO TB24
25.0000 mg | ORAL_TABLET | Freq: Every day | ORAL | Status: DC
  Administered 2019-12-27 – 2019-12-29 (×3): 25 mg via ORAL
  Filled 2019-12-26 (×4): qty 1

## 2019-12-26 MED ORDER — GLYCOPYRROLATE 1 MG PO TABS: 1.0000 mg | ORAL_TABLET | ORAL | Status: DC | PRN

## 2019-12-26 MED ORDER — ONDANSETRON 4 MG PO TBDP: 4.0000 mg | ORAL_TABLET | Freq: Four times a day (QID) | ORAL | Status: DC | PRN

## 2019-12-26 MED ORDER — ACETAMINOPHEN 325 MG PO TABS
650.0000 mg | ORAL_TABLET | Freq: Four times a day (QID) | ORAL | Status: DC | PRN
  Filled 2019-12-26: qty 2

## 2019-12-26 NOTE — Progress Notes (Signed)
Palliative:  [name] is resting quietly in bed having HD at this time.  He is alert and oriented, able to make his need known. His nieces [name] and [name] are at bedside.    We talk about nephrology visit earlier today. [name] tells me that nephrologist shares they feel there is no benefit to continuing HD as this is not changing things for him and he is expected to continue to worsen.  I asked [name] how he feels about this, and he tells me that he is okay with stopping dialysis.  His niece is at bedside are in agreement.  [name] tells me that he has been on dialysis for 6 years now.  We talk in detail about hospice care, what is and is not provided or covered at home and also at residential hospice.  We talked about provider choice in detail.  [name] had been scheduled to see [facility] hospice services, but after discussion he and family would like residential hospice in [place] so that his friends and family may visit.  All questions answered, no concerns at this time.  Conference with attending, HD RN, TOC team related to patient condition, need, [name], residential hospice referral.     [place] (DNR) form completed.   Plan:    Patient and family are agreeable to stopping HD and transitioning to comfort care, residential hospice with [facility].   Prognosis:  2 weeks or less when [name] stops HS, last HD session today.   48 minutes  [name] Axe, NP Palliative Medicine Team  Team Phone [phone] Greater than 50% of this time was spent counseling and coordinating care related to the above assessment and plan.

## 2019-12-26 NOTE — H&P (Signed)
History and Physical    [name] JFH:[id number] DOB: 1930/05/12 DOA: 12/26/2019  PCP: Sharilyn Sites, MD  Patient coming from: home  I have personally briefly reviewed patient's old medical records in [place]  Chief Complaint: GIP  HPI: [name] is a 86 y.o. male with medical history significant of end-stage renal disease on dialysis, metastatic lung cancer with mets to brain, COPD who is admitted to the hospital with generalized weakness, left greater than right.  He has been getting outpatient palliative chemo/radiation for brain metastases and was on Decadron and Keppra.  Repeat MRI of the brain showed progression of vasogenic edema on the right posterior hemisphere secondary to metastatic disease.  After discussing with family, decision was made to transition to hospice/comfort care.  Patient is currently awaiting a bed at residential hospice.  He is GIP status.  Further details please refer to discharge summary done by Dr. Reesa Chew on 8/18.   Past Medical History:  Diagnosis Date  . Arthritis    ankle , hip, knees , low back   . BPH (benign prostatic hypertrophy)   . Cancer ([place])    lung  . Cancer ([place]) 2001   renal  . Chronic inflammatory demyelinating polyneuropathy ([place]) 04/07/11   ,diagnosed 1992, tx. /w prednisone x 17 yrs. has been d/c for 3 yrs.   . Chronic kidney disease       . CIDP (chronic inflammatory demyelinating polyneuropathy) ([place])   . COPD (chronic obstructive pulmonary disease) ([place])    told that he has "a bit of emphysema"  . Dialysis patient ([place])   . GERD (gastroesophageal reflux disease)   . Hypertension    stress test done - many years ago  . Kidney calculus   . Pneumonia     Past Surgical History:  Procedure Laterality Date  . ANKLE FUSION     R ankle  . AV FISTULA PLACEMENT  06/01/2011   Procedure: ARTERIOVENOUS (AV) FISTULA CREATION;  Surgeon: Angelia Mould, MD;  Location: [facility] OR;  Service: Vascular;  Laterality: Left;   Creation of Left Arteriovenous fistula  . AV FISTULA PLACEMENT Left 07/12/2012   Procedure: ARTERIOVENOUS (AV) FISTULA CREATION;  Surgeon: Conrad [place], MD;  Location: [place];  Service: Vascular;  Laterality: Left;  . AV FISTULA PLACEMENT Left 4.11.14  . BASCILIC VEIN TRANSPOSITION Left 08/28/2012   Procedure: BASCILIC VEIN TRANSPOSITION;  Surgeon: Conrad Vernon, MD;  Location: [place];  Service: Vascular;  Laterality: Left;  left 2nd stage basilic vein transposition  . CHOLECYSTECTOMY    . COLONOSCOPY  2005   Dr. Laural Golden, diverticulosis, hemorrhoids, 2 small polyps  . FISTULOGRAM N/A 08/16/2011   Procedure: FISTULOGRAM;  Surgeon: Angelia Mould, MD;  Location[facility] CATH LAB;  Service: Cardiovascular;  Laterality: N/A;  . fistulogram left radial cephalic AV fistula  [phone]     Dr. Scot Dock  . HIP ARTHROPLASTY Right    "partial"  . JOINT REPLACEMENT     L knee- 2010- [facility], L hip partial replacement   . LUNG REMOVAL, PARTIAL  2003   partial, right  . NEPHRECTOMY  2001   left  . SHUNTOGRAM N/A 05/15/2012   Procedure: Earney Mallet;  Surgeon: Conrad Lancaster, MD;  Location: [facility] CATH LAB;  Service: Cardiovascular;  Laterality: N/A;  . SHUNTOGRAM N/A 05/09/2014   Procedure: FISTULOGRAM;  Surgeon: Conrad [place]ello, MD;  Location: [facility] CATH LAB;  Service: Cardiovascular;  Laterality: N/A;    Social History:  reports  that he quit smoking about 42 years ago. His smoking use included cigarettes. He quit after 35.00 years of use. His smokeless tobacco use includes chew. He reports that he does not drink alcohol and does not use drugs.  No Known Allergies  Family History  Problem Relation Age of Onset  . Heart disease Mother   . Heart disease Father   . Heart attack Father   . Anesthesia problems Neg Hx   . Hypotension Neg Hx   . Malignant hyperthermia Neg Hx   . Pseudochol deficiency Neg Hx   . Colon cancer Neg Hx      Prior to Admission medications   Medication Sig Start Date End Date Taking?  Authorizing Provider  acetaminophen (TYLENOL) 325 MG tablet Take 2 tablets (650 mg total) by mouth every 6 (six) hours as needed for mild pain or moderate pain. 10/05/19   Kathie Dike, MD  ALPRAZolam Duanne Moron) 0.5 MG tablet Take 1 tablet (0.5 mg total) by mouth 3 (three) times daily as needed for sleep or anxiety. 12/26/19   Amin, Jeanella Flattery, MD  cephALEXin (KEFLEX) 500 MG capsule Take 1 capsule (500 mg total) by mouth 4 (four) times daily for 5 days. 12/26/19 Jan 25, 2020  Amin, Jeanella Flattery, MD  Fluticasone-Salmeterol (ADVAIR) 100-50 MCG/DOSE AEPB Inhale 1 puff into the lungs in the morning and at bedtime.  09/04/19   [provider]  levETIRAcetam (KEPPRA) 500 MG tablet Take 1 tablet (500 mg total) by mouth 2 (two) times daily. 11/27/19 12/27/19  Arrien, Jimmy Picket, MD  lidocaine-prilocaine (EMLA) cream Apply 1 application topically every Monday, Wednesday, and Friday with hemodialysis.  12/17/14   [provider]  metoprolol succinate (TOPROL-XL) 25 MG 24 hr tablet Take 25 mg by mouth daily.    [provider]  omeprazole (PRILOSEC) 20 MG capsule Take 20 mg by mouth daily.    [provider]  oxycodone (OXY-IR) 5 MG capsule Take 1 capsule (5 mg total) by mouth every 4 (four) hours as needed for pain. 12/26/19   Amin, Jeanella Flattery, MD  Sennosides-Docusate Sodium 8.6-50 MG CAPS Take 1 capsule by mouth in the morning and at bedtime.    [provider]  VENTOLIN HFA 108 (90 Base) MCG/ACT inhaler Inhale 2 puffs into the lungs every 4 (four) hours as needed for wheezing or shortness of breath.  09/04/19   [provider]    Physical Exam: There were no vitals filed for this visit.  Constitutional: NAD, calm, comfortable Eyes: PERRL, lids and conjunctivae normal ENMT: Mucous membranes are moist. Posterior pharynx clear of any exudate or lesions.Normal dentition.  Neck: normal, supple, no masses, no thyromegaly Respiratory: clear to auscultation  bilaterally, no wheezing, no crackles. Normal respiratory effort. No accessory muscle use.     Labs on Admission: I have personally reviewed following labs and imaging studies  CBC: Recent Labs  Lab 12/24/19 1516 12/25/19 0549 12/26/19 0612  WBC 5.5 5.1 4.4  NEUTROABS 4.5  --   --   HGB 11.3* 10.7* 9.3*  HCT 35.0* 34.3* 29.1*  MCV 102.0* 104.9* 101.4*  PLT 84* 53* 68*   Basic Metabolic Panel: Recent Labs  Lab 12/24/19 1516 12/25/19 0549 12/26/19 0612  NA 135 133* 130*  K 4.9 4.8 5.8*  CL 98 96* 96*  CO2 24 24 21*  GLUCOSE 120* 115* 180*  BUN 59* 69* 94*  CREATININE 5.51* 6.58* 8.19*  CALCIUM 7.7* 7.9* 8.1*  MG  --  2.1 2.2  PHOS  --  5.8*  --    GFR: Estimated Creatinine Clearance: 6.5 mL/min (A) (by C-G formula based on SCr of 8.19 mg/dL (H)). Liver Function Tests: Recent Labs  Lab 12/24/19 1516 12/25/19 0549  AST 13* 9*  ALT 19 17  ALKPHOS 76 70  BILITOT 0.8 0.8  PROT 5.9* 5.5*  ALBUMIN 3.0* 2.8*   Recent Labs  Lab 12/24/19 1516  LIPASE 69*   No results for input(s): AMMONIA in the last 168 hours. Coagulation Profile: Recent Labs  Lab 12/24/19 2132 12/25/19 0549  INR 1.0 1.0   Cardiac Enzymes: No results for input(s): CKTOTAL, CKMB, CKMBINDEX, TROPONINI in the last 168 hours. BNP (last 3 results) No results for input(s): PROBNP in the last 8760 hours. HbA1C: No results for input(s): HGBA1C in the last 72 hours. CBG: Recent Labs  Lab 12/24/19 1515  GLUCAP 120*   Lipid Profile: No results for input(s): CHOL, HDL, LDLCALC, TRIG, CHOLHDL, LDLDIRECT in the last 72 hours. Thyroid Function Tests: No results for input(s): TSH, T4TOTAL, FREET4, T3FREE, THYROIDAB in the last 72 hours. Anemia Panel: No results for input(s): VITAMINB12, FOLATE, FERRITIN, TIBC, IRON, RETICCTPCT in the last 72 hours. Urine analysis:    Component Value Date/Time   COLORURINE YELLOW 12/24/2019 1536   APPEARANCEUR HAZY (A) 12/24/2019 1536   LABSPEC 1.013  12/24/2019 1536   PHURINE 6.0 12/24/2019 1536   GLUCOSEU 50 (A) 12/24/2019 1536   HGBUR LARGE (A) 12/24/2019 1536   BILIRUBINUR NEGATIVE 12/24/2019 1536   KETONESUR NEGATIVE 12/24/2019 1536   PROTEINUR 100 (A) 12/24/2019 1536   UROBILINOGEN 0.2 01/01/2009 1054   NITRITE NEGATIVE 12/24/2019 1536   LEUKOCYTESUR LARGE (A) 12/24/2019 1536    Radiological Exams on Admission: MR BRAIN WO CONTRAST  Result Date: 12/25/2019 CLINICAL DATA:  86 year old male with lung cancer metastatic to the brain. Reportedly undergoing radiation treatment - although no definite evidence of SRS treatment in the EMR [facility] was recommended by radiation oncology last month). Neurologic deficit.  Dialysis patient. EXAM: MRI HEAD WITHOUT CONTRAST TECHNIQUE: Multiplanar, multiecho pulse sequences of the brain and surrounding structures were obtained without intravenous contrast. COMPARISON:  SRS treatment planning brain MRI 11/27/2019. FINDINGS: Brain: No restricted diffusion suggestive of acute infarction. Metastatic disease related vasogenic edema in the anterior left frontal lobe has not significantly changed since 11/27/2019. However, edema has mildly increased in the posterior right hemisphere related to a parietal lobe metastasis (series 12, image 25 today with the primary mass faintly evident on series 10, image 22). Small right cerebellar mass remain stable without significant edema. No intracranial mass effect. No midline shift and basilar cisterns remain normal. Stable ventricle size and configuration. Occasional chronic micro hemorrhages again noted. No acute intracranial hemorrhage identified. Negative pituitary and cervicomedullary junction. Additional nonspecific cerebral white matter and deep gray nuclei T2 and FLAIR heterogeneity is stable. 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 Vascular: Major intracranial vascular flow voids are stable. Skull and upper cervical spine: Stable and negative for age  visible cervical spine. Visualized bone marrow signal is within normal limits. Sinuses/Orbits: Stable, negative. Other: Mastoids remain well pneumatized. Scalp and face appear negative. IMPRESSION: 1. Mildly progressed vasogenic edema in the posterior right hemisphere related to the dominant brain metastasis demonstrated on 11/27/2019. 2. Stable edema in the anterior left frontal lobe at the site of two smaller metastases. No new areas of cerebral edema. 3. No evidence of acute infarct. Electronically Signed   By: Genevie Ann M.D.   On: 12/25/2019 15:53  US Carotid Bilateral  Result Date: 12/25/2019 CLINICAL DATA:  Hypertension, stroke symptoms EXAM: BILATERAL CAROTID DUPLEX ULTRASOUND TECHNIQUE: Pearline Cables scale imaging, color Doppler and duplex ultrasound were performed of bilateral carotid and vertebral arteries in the neck. COMPARISON:  None. FINDINGS: Criteria: Quantification of carotid stenosis is based on velocity parameters that correlate the residual internal carotid diameter with NASCET-based stenosis levels, using the diameter of the distal internal carotid lumen as the denominator for stenosis measurement. The following velocity measurements were obtained: RIGHT ICA: 54/11 cm/sec CCA: 97/6 cm/sec SYSTOLIC ICA/CCA RATIO:  0.7 ECA: 55 cm/sec LEFT ICA: 51/12 cm/sec CCA: 73/41 cm/sec SYSTOLIC ICA/CCA RATIO:  0.6 ECA: 62 cm/sec RIGHT CAROTID ARTERY: Minor echogenic shadowing plaque formation. No hemodynamically significant right ICA stenosis, velocity elevation, or turbulent flow. Degree of narrowing less than 50%. RIGHT VERTEBRAL ARTERY:  Normal antegrade flow LEFT CAROTID ARTERY: Similar scattered minor echogenic plaque formation. No hemodynamically significant left ICA stenosis, velocity elevation, or turbulent flow. LEFT VERTEBRAL ARTERY:  Normal antegrade flow IMPRESSION: Minor carotid atherosclerosis. No hemodynamically significant ICA stenosis. Degree of narrowing less than 50% bilaterally by ultrasound  criteria. Patent antegrade vertebral flow bilaterally Electronically Signed   By: Jerilynn Mages.  Shick M.D.   On: 12/25/2019 16:08   US Venous Img Upper Uni Left (DVT)  Result Date: 12/25/2019 CLINICAL DATA:  Left upper extremity edema. History of end-stage renal disease with left upper arm fistula. EXAM: LEFT UPPER EXTREMITY VENOUS DOPPLER ULTRASOUND TECHNIQUE: Gray-scale sonography with graded compression, as well as color Doppler and duplex ultrasound were performed to evaluate the upper extremity deep venous system from the level of the subclavian vein and including the jugular, axillary, basilic, radial, ulnar and upper cephalic vein. Spectral Doppler was utilized to evaluate flow at rest and with distal augmentation maneuvers. COMPARISON:  None. FINDINGS: Contralateral Subclavian Vein: Respiratory phasicity is normal and symmetric with the symptomatic side. No evidence of thrombus. Normal compressibility. Internal Jugular Vein: No evidence of thrombus. Normal compressibility, respiratory phasicity and response to augmentation. Subclavian Vein: No evidence of thrombus. Normal compressibility, respiratory phasicity and response to augmentation. Axillary Vein: No evidence of thrombus. Normal compressibility, respiratory phasicity and response to augmentation. Cephalic Vein: No evidence of thrombus. Normal compressibility, respiratory phasicity and response to augmentation. Basilic Vein: No evidence of thrombus. Normal compressibility, respiratory phasicity and response to augmentation. Brachial Veins: No evidence of thrombus. Normal compressibility, respiratory phasicity and response to augmentation. Radial Veins: No evidence of thrombus. Normal compressibility, respiratory phasicity and response to augmentation. Ulnar Veins: No evidence of thrombus. Normal compressibility, respiratory phasicity and response to augmentation. Venous Reflux:  None visualized. Other Findings: No evidence of superficial thrombophlebitis.  Image portion of a left upper arm AV fistula appears patent. Formal evaluation of the entire fistula was not performed. IMPRESSION: No evidence of DVT within the left upper extremity. Electronically Signed   By: Aletta Edouard M.D.   On: 12/25/2019 12:46   ECHOCARDIOGRAM COMPLETE  Result Date: 12/25/2019    ECHOCARDIOGRAM REPORT   Patient Name:   JAWANN URBANI Detwiler Date of Exam: 12/25/2019 Medical Rec #:  [id number]      Height:       66.0 in Accession #:    [id number]     Weight:       180.0 lb Date of Birth:  1930-07-07       BSA:          1.912 m Patient Age:    89 years       BP:  138/64 mmHg Patient Gender: M              HR:           102 bpm. Exam Location:  [facility] Procedure: 2D Echo Indications:    Stroke 434.91 / I163.9  History:        Patient has prior history of Echocardiogram examinations, most                 recent 01/05/2018. COPD, Arrythmias:Atrial Fibrillation; Risk                 Factors:Hypertension. ESRD, GERD, Bronchoalveolar carcinoma ,                 Metastasis to brain.  Sonographer:    Leavy Cella RDCS (AE) Referring Phys: [id number] OLADAPO ADEFESO IMPRESSIONS  1. Left ventricular ejection fraction, by estimation, is 60 to 65%. The left ventricle has normal function. The left ventricle has no regional wall motion abnormalities. There is moderate left ventricular hypertrophy. Left ventricular diastolic parameters are indeterminate.  2. Right ventricular systolic function is normal. The right ventricular size is normal. There is mildly elevated pulmonary artery systolic pressure. The estimated right ventricular systolic pressure is 45.4 mmHg.  3. Left atrial size was mild to moderately dilated.  4. Right atrial size was upper normal.  5. The mitral valve is grossly normal. Trivial mitral valve regurgitation.  6. The aortic valve is tricuspid. Aortic valve regurgitation is trivial. Mild aortic valve sclerosis is present, with no evidence of aortic valve stenosis.  7. The inferior  vena cava is normal in size with greater than 50% respiratory variability, suggesting right atrial pressure of 3 mmHg. FINDINGS  Left Ventricle: Left ventricular ejection fraction, by estimation, is 60 to 65%. The left ventricle has normal function. The left ventricle has no regional wall motion abnormalities. The left ventricular internal cavity size was normal in size. There is  moderate left ventricular hypertrophy. Left ventricular diastolic parameters are indeterminate. Right Ventricle: The right ventricular size is normal. No increase in right ventricular wall thickness. Right ventricular systolic function is normal. There is mildly elevated pulmonary artery systolic pressure. The tricuspid regurgitant velocity is 3.00  m/s, and with an assumed right atrial pressure of 3 mmHg, the estimated right ventricular systolic pressure is 09.8 mmHg. Left Atrium: Left atrial size was mild to moderately dilated. Right Atrium: Right atrial size was upper normal. Pericardium: There is no evidence of pericardial effusion. Presence of pericardial fat pad. Mitral Valve: The mitral valve is grossly normal. Mild mitral annular calcification. Trivial mitral valve regurgitation. Tricuspid Valve: The tricuspid valve is grossly normal. Tricuspid valve regurgitation is trivial. Aortic Valve: The aortic valve is tricuspid. Aortic valve regurgitation is trivial. Mild aortic valve sclerosis is present, with no evidence of aortic valve stenosis. Mild aortic valve annular calcification. There is mild calcification of the aortic valve. Pulmonic Valve: The pulmonic valve was grossly normal. Pulmonic valve regurgitation is trivial. Aorta: The aortic root is normal in size and structure. Venous: The inferior vena cava is normal in size with greater than 50% respiratory variability, suggesting right atrial pressure of 3 mmHg. IAS/Shunts: No atrial level shunt detected by color flow Doppler.  LEFT VENTRICLE PLAX 2D LVIDd:         3.37 cm   Diastology LVIDs:         1.84 cm  LV e' lateral:   14.60 cm/s LV PW:  1.56 cm  LV E/e' lateral: 8.2 LV IVS:        1.75 cm  LV e' medial:    9.14 cm/s LVOT diam:     2.00 cm  LV E/e' medial:  13.1 LVOT Area:     3.14 cm  RIGHT VENTRICLE RV S prime:     25.00 cm/s TAPSE (M-mode): 2.4 cm LEFT ATRIUM             Index       RIGHT ATRIUM           Index LA diam:        2.80 cm 1.46 cm/m  RA Area:     21.80 cm LA Vol (A2C):   97.7 ml 51.09 ml/m RA Volume:   63.30 ml  33.10 ml/m LA Vol (A4C):   73.4 ml 38.38 ml/m LA Biplane Vol: 87.9 ml 45.97 ml/m   AORTA Ao Root diam: 3.60 cm MITRAL VALVE                TRICUSPID VALVE MV Area (PHT): 4.18 cm     TR Peak grad:   36.0 mmHg MV Decel Time: 181 msec     TR Vmax:        300.00 cm/s MV E velocity: 119.67 cm/s                             SHUNTS                             Systemic Diam: 2.00 cm Rozann Lesches MD Electronically signed by Rozann Lesches MD Signature Date/Time: 12/25/2019/11:37:35 AM    Final     Assessment/Plan Active Problems:   Metastatic non-small cell lung cancer [facility])    Patient with metastatic lung cancer to brain mets with no vasogenic edema, end-stage renal disease on hemodialysis.  He is currently on comfort measures with residential hospice.  He is awaiting a bed at the residential hospice facility.  Currently GIP status.  Continue supportive care.  For further details of his hospital course, please refer to discharge summary done by Dr. Reesa Chew on 8/18.   Kathie Dike MD [facility]   If 7PM-7AM, please contact night-coverage [email or web address]   12/26/2019, 8:42 PM

## 2019-12-26 NOTE — Progress Notes (Signed)
OT Cancellation Note  Patient Details Name: [name] MRN: [id number] DOB: April 25, 1931   Cancelled Treatment:    Reason Eval/Treat Not Completed: Patient at procedure or test/ unavailable. MD in room in discussion with pt and family member. Per chart review pt and family are considering hospice dependent upon MD recommendations today. Will continue to follow and will evaluate if pt is appropriate.    Guadelupe Sabin, OTR/L  [phone] 12/26/2019, 8:21 AM

## 2019-12-26 NOTE — TOC Transition Note (Signed)
Transition of Care [facility]) - CM/SW Discharge Note  Patient Details  Name: [name] MRN: [id number] Date of Birth: [birth year]  [facility] CM/SW Contact:  Sherie Don, LCSW Phone Number: 12/26/2019, 2:34 PM  Clinical Narrative: After reviewing results and meeting with the nephrologist and palliative, the family is agreeable to referral for residential hospice. CSW spoke with patient's niece, [name], to discuss hospice options. Niece requested [facility] and to transfer the patient to the [facility]. CSW explained that if a bed is not currently available, the patient will be made GIP and stay at the hospital under hospice until a bed becomes available. CSW made referral to Cassandra with [facility], who confirmed there are no beds currently available. CSW received call from RN, [place], with [facility]. Per Eustaquio Maize, she will speak with the patient's family and notify CSW when the admission is complete so the chart can be flipped. CSW called [facility] and spoke with Magda Paganini to inform her the patient will be transitioning to residential hospice. Beth called CSW and patient can be made GIP. TOC signing off.  Final next level of care: [place] Barriers to Discharge: Barriers Resolved  Patient Goals and CMS Choice Patient states their goals for this hospitalization and ongoing recovery are:: Discharge to hospice house once a bed becomes available CMS Medicare.gov Compare Post Acute Care list provided to:: Patient Represenative (must comment) [name] Delorse Lek (niece)) Choice offered to / list presented to : [facility] [place] POA / Guardian  Discharge Plan and Services In-house Referral: Clinical Social Work, Hospice / Palliative Care Discharge Planning Services: NA         DME Arranged: N/A DME Agency: NA [place] Date [place]: 12/26/19 Time [place]: 2244 Representative spoke with at [place]:  [place]  Readmission Risk Interventions Readmission Risk Prevention Plan 12/25/2019 11/27/2019  Transportation Screening Complete Complete  PCP or Specialist Appt within 3-5 Days Not Complete -  Not Complete comments Awaiting results for MRI and tests to determine what the patient wants to do regarding his care -  [place] or [place] Complete Complete  Social Work Consult for [place] Planning/Counseling Complete Complete  Palliative Care Screening Complete Not Applicable  Medication Review Press photographer) - Complete  Some recent data might be hidden

## 2019-12-26 NOTE — Progress Notes (Signed)
Subjective:  HOH-  Spoke with [name] pts niece with patient listening  Objective Vital signs in last 24 hours: Vitals:   12/25/19 2039 12/25/19 2135 12/26/19 0508 12/26/19 0719  BP:  116/67 132/66   Pulse: 85 76 71   Resp: 18 16 16    Temp:  98.8 F (37.1 C) 97.7 F (36.5 C)   TempSrc:   Oral   SpO2: 96% 99% 99% 99%  Weight:      Height:       Weight change:   Intake/Output Summary (Last 24 hours) at 12/26/2019 0759 Last data filed at 12/26/2019 0500 Gross per 24 hour  Intake 100 ml  Output --  Net 100 ml    Assessment/ Plan: Pt is a 86 y.o. yo male with ESRD and metastatic cancer who was admitted on 12/24/2019 with weakness and pain  Assessment/Plan: 1. Metastatic cancer-  Recent  initiation of palliative radiation with decline and failure to thrive 2. ESRD -has been on dialysis for 6 years.  [facility] Monday Wednesday Friday via fistula.  Patient and family having very difficult time with the burden of the decision to stop dialysis.  I specifically spoke with [name] today and told her that my concern is that he is not stable/it is not practical to continue with outpatient dialysis given his clinical status and likelihood for clinical decline- therefore I am really making the decision for that.  I believe she understood what I was saying.  I do not have a major problem with performing dialysis in-house today but have made my opinion known that I really do not think outpatient dialysis is practical at this point.  So, it would be my recommendation to not continue with OP dialysis.  Also give him time to focus on quality of life.  I expressed that this is happening whether or not they stop dialysis.  [name] expressed interest in meeting with inpatient or outpatient hospice today so that they can have discussions and make decisions for the future 3. Anemia-not a significant issue at this time  5. HTN/volume-blood pressure stable  [name]    Labs: Basic Metabolic  Panel: Recent Labs  Lab 12/24/19 1516 12/25/19 0549 12/26/19 0612  NA 135 133* 130*  K 4.9 4.8 5.8*  CL 98 96* 96*  CO2 24 24 21*  GLUCOSE 120* 115* 180*  BUN 59* 69* 94*  CREATININE 5.51* 6.58* 8.19*  CALCIUM 7.7* 7.9* 8.1*  PHOS  --  5.8*  --    Liver Function Tests: Recent Labs  Lab 12/24/19 1516 12/25/19 0549  AST 13* 9*  ALT 19 17  ALKPHOS 76 70  BILITOT 0.8 0.8  PROT 5.9* 5.5*  ALBUMIN 3.0* 2.8*   Recent Labs  Lab 12/24/19 1516  LIPASE 69*   No results for input(s): AMMONIA in the last 168 hours. CBC: Recent Labs  Lab 12/24/19 1516 12/25/19 0549 12/26/19 0612  WBC 5.5 5.1 4.4  NEUTROABS 4.5  --   --   HGB 11.3* 10.7* 9.3*  HCT 35.0* 34.3* 29.1*  MCV 102.0* 104.9* 101.4*  PLT 84* 53* 68*   Cardiac Enzymes: No results for input(s): CKTOTAL, CKMB, CKMBINDEX, TROPONINI in the last 168 hours. CBG: Recent Labs  Lab 12/24/19 1515  GLUCAP 120*    Iron Studies: No results for input(s): IRON, TIBC, TRANSFERRIN, FERRITIN in the last 72 hours. Studies/Results: DG Chest 2 View  Result Date: 12/24/2019 CLINICAL DATA:  86 year old male with weakness and fatigue. EXAM: CHEST -  2 VIEW COMPARISON:  Chest radiograph dated 05/30/2017. FINDINGS: There is a small left pleural effusion and left lung base patchy opacity which may represent atelectasis but concerning for pneumonia. Clinical correlation recommended. The right lung is clear. No pneumothorax. Stable cardiac silhouette. Atherosclerotic calcification of the aorta. No acute osseous pathology. Degenerative changes of the spine. IMPRESSION: Small left pleural effusion and left lung base patchy opacity concerning for pneumonia. Electronically Signed   By: Anner Crete M.D.   On: 12/24/2019 18:53   CT Head Wo Contrast  Result Date: 12/24/2019 CLINICAL DATA:  Has history of brain cancer and had a treatment 2 weeks ago, according to daughter he has been getting weaker on the left side, he had pain down his left  side to his buttock. Patient took an oxycodone at 1am and it relieved his pain. Patient took another before dialysis. Getting into car patient was unable to lift his left leg on his own. EXAM: CT HEAD WITHOUT CONTRAST TECHNIQUE: Contiguous axial images were obtained from the base of the skull through the vertex without intravenous contrast. COMPARISON:  11/21/2019, head CT and brain MRI. Brain MRI, 11/27/2019. FINDINGS: Brain: Low-attenuation reflecting vasogenic edema extends throughout the right parietal lobe involving the adjacent posterior right frontal lobe and involves a smaller area of the left frontal lobe. This is unchanged compared to the prior head CT. There is slight, 1-2 mm, midline shift of the posterosuperior falx cerebri, also stable. The metastatic lesions which were noted in the right parietal lobe, left frontal lobe and superior right cerebellum are not defined on CT. There are no new areas of vasogenic edema to suggest new metastatic lesions. There is no hydrocephalus. There is no evidence of acute/recent infarction. There are no extra-axial masses or abnormal fluid collections. There is no intracranial hemorrhage. Additional white matter hypoattenuation is noted bilaterally consistent with chronic microvascular ischemic change. Vascular: No hyperdense vessel or unexpected calcification. Skull: Normal. Negative for fracture or focal lesion. Sinuses/Orbits: Globes and orbits are unremarkable. The visualized sinuses show mild maxillary sinus mucosal thickening but are otherwise clear. Other: None. IMPRESSION: 1. No interval change in the head CT appearance from the study dated 11/21/2019. The right parietal and left frontal lobe metastatic lesions are not defined, but the significant vasogenic edema is well depicted and appears unchanged in extent. There is minimal stable mass effect from the right parietal lesion slightly deviating the superior falx cerebri to the left. Electronically Signed   By:  Lajean Manes M.D.   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 Reportedly undergoing radiation treatment - although no definite evidence of SRS treatment in the EMR [facility] was recommended by radiation oncology last month). Neurologic deficit.  Dialysis patient. EXAM: MRI HEAD WITHOUT CONTRAST TECHNIQUE: Multiplanar, multiecho pulse sequences of the brain and surrounding structures were obtained without intravenous contrast. COMPARISON:  SRS treatment planning brain MRI 11/27/2019. FINDINGS: Brain: No restricted diffusion suggestive of acute infarction. Metastatic disease related vasogenic edema in the anterior left frontal lobe has not significantly changed since 11/27/2019. However, edema has mildly increased in the posterior right hemisphere related to a parietal lobe metastasis (series 12, image 25 today with the primary mass faintly evident on series 10, image 22). Small right cerebellar mass remain stable without significant edema. No intracranial mass effect. No midline shift and basilar cisterns remain normal. Stable ventricle size and configuration. Occasional chronic micro  hemorrhages again noted. No acute intracranial hemorrhage identified. Negative pituitary and cervicomedullary junction. Additional nonspecific cerebral white matter and deep gray nuclei T2 and FLAIR heterogeneity is stable. 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 Vascular: Major intracranial vascular flow voids are stable. Skull and upper cervical spine: Stable and negative for age visible cervical spine. Visualized bone marrow signal is within normal limits. Sinuses/Orbits: Stable, negative. Other: Mastoids remain well pneumatized. Scalp and face appear negative. IMPRESSION: 1. Mildly progressed vasogenic edema in the posterior right hemisphere related to the dominant brain metastasis demonstrated on 11/27/2019. 2.  Stable edema in the anterior left frontal lobe at the site of two smaller metastases. No new areas of cerebral edema. 3. No evidence of acute infarct. Electronically Signed   By: Genevie Ann M.D.   On: 12/25/2019 15:53   US Carotid Bilateral  Result Date: 12/25/2019 CLINICAL DATA:  Hypertension, stroke symptoms EXAM: BILATERAL CAROTID DUPLEX ULTRASOUND TECHNIQUE: Pearline Cables scale imaging, color Doppler and duplex ultrasound were performed of bilateral carotid and vertebral arteries in the neck. COMPARISON:  None. FINDINGS: Criteria: Quantification of carotid stenosis is based on velocity parameters that correlate the residual internal carotid diameter with NASCET-based stenosis levels, using the diameter of the distal internal carotid lumen as the denominator for stenosis measurement. The following velocity measurements were obtained: RIGHT ICA: 54/11 cm/sec CCA: 02/5 cm/sec SYSTOLIC ICA/CCA RATIO:  0.7 ECA: 55 cm/sec LEFT ICA: 51/12 cm/sec CCA: 42/70 cm/sec SYSTOLIC ICA/CCA RATIO:  0.6 ECA: 62 cm/sec RIGHT CAROTID ARTERY: Minor echogenic shadowing plaque formation. No hemodynamically significant right ICA stenosis, velocity elevation, or turbulent flow. Degree of narrowing less than 50%. RIGHT VERTEBRAL ARTERY:  Normal antegrade flow LEFT CAROTID ARTERY: Similar scattered minor echogenic plaque formation. No hemodynamically significant left ICA stenosis, velocity elevation, or turbulent flow. LEFT VERTEBRAL ARTERY:  Normal antegrade flow IMPRESSION: Minor carotid atherosclerosis. No hemodynamically significant ICA stenosis. Degree of narrowing less than 50% bilaterally by ultrasound criteria. Patent antegrade vertebral flow bilaterally Electronically Signed   By: Jerilynn Mages.  Shick M.D.   On: 12/25/2019 16:08   US Venous Img Upper Uni Left (DVT)  Result Date: 12/25/2019 CLINICAL DATA:  Left upper extremity edema. History of end-stage renal disease with left upper arm fistula. EXAM: LEFT UPPER EXTREMITY VENOUS DOPPLER  ULTRASOUND TECHNIQUE: Gray-scale sonography with graded compression, as well as color Doppler and duplex ultrasound were performed to evaluate the upper extremity deep venous system from the level of the subclavian vein and including the jugular, axillary, basilic, radial, ulnar and upper cephalic vein. Spectral Doppler was utilized to evaluate flow at rest and with distal augmentation maneuvers. COMPARISON:  None. FINDINGS: Contralateral Subclavian Vein: Respiratory phasicity is normal and symmetric with the symptomatic side. No evidence of thrombus. Normal compressibility. Internal Jugular Vein: No evidence of thrombus. Normal compressibility, respiratory phasicity and response to augmentation. Subclavian Vein: No evidence of thrombus. Normal compressibility, respiratory phasicity and response to augmentation. Axillary Vein: No evidence of thrombus. Normal compressibility, respiratory phasicity and response to augmentation. Cephalic Vein: No evidence of thrombus. Normal compressibility, respiratory phasicity and response to augmentation. Basilic Vein: No evidence of thrombus. Normal compressibility, respiratory phasicity and response to augmentation. Brachial Veins: No evidence of thrombus. Normal compressibility, respiratory phasicity and response to augmentation. Radial Veins: No evidence of thrombus. Normal compressibility, respiratory phasicity and response to augmentation. Ulnar Veins: No evidence of thrombus. Normal compressibility, respiratory phasicity and response to augmentation. Venous Reflux:  None visualized. Other Findings: No evidence of superficial thrombophlebitis. Image portion  of a left upper arm AV fistula appears patent. Formal evaluation of the entire fistula was not performed. IMPRESSION: No evidence of DVT within the left upper extremity. Electronically Signed   By: Aletta Edouard M.D.   On: 12/25/2019 12:46   ECHOCARDIOGRAM COMPLETE  Result Date: 12/25/2019    ECHOCARDIOGRAM REPORT    Patient Name:   [name] Date of Exam: 12/25/2019 Medical Rec #:  [id number]      Height:       66.0 in Accession #:    [id number]     Weight:       180.0 lb Date of Birth:  05/05/1931       BSA:          1.912 m Patient Age:    53 years       BP:           138/64 mmHg Patient Gender: M              HR:           102 bpm. Exam Location:  [facility] Procedure: 2D Echo Indications:    Stroke 434.91 / I163.9  History:        Patient has prior history of Echocardiogram examinations, most                 recent 01/05/2018. COPD, Arrythmias:Atrial Fibrillation; Risk                 Factors:Hypertension. ESRD, GERD, Bronchoalveolar carcinoma ,                 Metastasis to brain.  Sonographer:    Leavy Cella RDCS (AE) Referring Phys: [id number] OLADAPO ADEFESO IMPRESSIONS  1. Left ventricular ejection fraction, by estimation, is 60 to 65%. The left ventricle has normal function. The left ventricle has no regional wall motion abnormalities. There is moderate left ventricular hypertrophy. Left ventricular diastolic parameters are indeterminate.  2. Right ventricular systolic function is normal. The right ventricular size is normal. There is mildly elevated pulmonary artery systolic pressure. The estimated right ventricular systolic pressure is 99.3 mmHg.  3. Left atrial size was mild to moderately dilated.  4. Right atrial size was upper normal.  5. The mitral valve is grossly normal. Trivial mitral valve regurgitation.  6. The aortic valve is tricuspid. Aortic valve regurgitation is trivial. Mild aortic valve sclerosis is present, with no evidence of aortic valve stenosis.  7. The inferior vena cava is normal in size with greater than 50% respiratory variability, suggesting right atrial pressure of 3 mmHg. FINDINGS  Left Ventricle: Left ventricular ejection fraction, by estimation, is 60 to 65%. The left ventricle has normal function. The left ventricle has no regional wall motion abnormalities. The left ventricular  internal cavity size was normal in size. There is  moderate left ventricular hypertrophy. Left ventricular diastolic parameters are indeterminate. Right Ventricle: The right ventricular size is normal. No increase in right ventricular wall thickness. Right ventricular systolic function is normal. There is mildly elevated pulmonary artery systolic pressure. The tricuspid regurgitant velocity is 3.00  m/s, and with an assumed right atrial pressure of 3 mmHg, the estimated right ventricular systolic pressure is 71.6 mmHg. Left Atrium: Left atrial size was mild to moderately dilated. Right Atrium: Right atrial size was upper normal. Pericardium: There is no evidence of pericardial effusion. Presence of pericardial fat pad. Mitral Valve: The mitral valve is grossly normal. Mild mitral annular calcification. Trivial mitral valve regurgitation.  Tricuspid Valve: The tricuspid valve is grossly normal. Tricuspid valve regurgitation is trivial. Aortic Valve: The aortic valve is tricuspid. Aortic valve regurgitation is trivial. Mild aortic valve sclerosis is present, with no evidence of aortic valve stenosis. Mild aortic valve annular calcification. There is mild calcification of the aortic valve. Pulmonic Valve: The pulmonic valve was grossly normal. Pulmonic valve regurgitation is trivial. Aorta: The aortic root is normal in size and structure. Venous: The inferior vena cava is normal in size with greater than 50% respiratory variability, suggesting right atrial pressure of 3 mmHg. IAS/Shunts: No atrial level shunt detected by color flow Doppler.  LEFT VENTRICLE PLAX 2D LVIDd:         3.37 cm  Diastology LVIDs:         1.84 cm  LV e' lateral:   14.60 cm/s LV PW:         1.56 cm  LV E/e' lateral: 8.2 LV IVS:        1.75 cm  LV e' medial:    9.14 cm/s LVOT diam:     2.00 cm  LV E/e' medial:  13.1 LVOT Area:     3.14 cm  RIGHT VENTRICLE RV S prime:     25.00 cm/s TAPSE (M-mode): 2.4 cm LEFT ATRIUM             Index       RIGHT  ATRIUM           Index LA diam:        2.80 cm 1.46 cm/m  RA Area:     21.80 cm LA Vol (A2C):   97.7 ml 51.09 ml/m RA Volume:   63.30 ml  33.10 ml/m LA Vol (A4C):   73.4 ml 38.38 ml/m LA Biplane Vol: 87.9 ml 45.97 ml/m   AORTA Ao Root diam: 3.60 cm MITRAL VALVE                TRICUSPID VALVE MV Area (PHT): 4.18 cm     TR Peak grad:   36.0 mmHg MV Decel Time: 181 msec     TR Vmax:        300.00 cm/s MV E velocity: 119.67 cm/s                             SHUNTS                             Systemic Diam: 2.00 cm Rozann Lesches MD Electronically signed by Rozann Lesches MD Signature Date/Time: 12/25/2019/11:37:35 AM    Final    Medications: Infusions: . cefTRIAXone (ROCEPHIN)  IV 1 g (12/25/19 1814)    Scheduled Medications: . dexamethasone  4 mg Oral Q8H  . levETIRAcetam  500 mg Oral BID  . metoprolol succinate  25 mg Oral Daily  . mometasone-formoterol  2 puff Inhalation BID  . pantoprazole  40 mg Oral Daily    have reviewed scheduled and prn medications.  Physical Exam: General: Seems comfortable, eyes closed for most of our interaction-some confusion Heart: Regular rate and rhythm Lungs: Mostly clear Abdomen: Soft, nontender Extremities: Pitting edema Dialysis Access: Left upper arm AV fistula with good thrill and bruit   12/26/2019,7:59 AM  LOS: 2 days

## 2019-12-26 NOTE — Procedures (Signed)
     HEMODIALYSIS TREATMENT NOTE:  3.5 hour heparin-free HD completed via LUA AVF (16g/antegrade). Goal NOT met:  Unable to tolerate removal of 2L as ordered due to hypotension.  UF was interrupted for 2 hours total whenever SBP<100.  All blood was returned and hemostasis was achieved in 15 minutes.  Net UF 455cc.  No changes from pre-HD assessment.  Rockwell Alexandria, RN

## 2019-12-26 NOTE — Care Management Important Message (Signed)
Important Message  Patient Details  Name: [name] MRN: [id number] Date of Birth: [birth year]   Medicare Important Message Given:  Yes     Tommy Medal 12/26/2019, 4:14 PM

## 2019-12-26 NOTE — Discharge Summary (Signed)
Physician Discharge Summary  [name] IEP:[id number] DOB: [birth year] DOA: 12/24/2019  PCP: Sharilyn Sites, MD  Admit date: 12/24/2019 Discharge date: 12/26/2019  Admitted From: Home Disposition: Residential hospice  Recommendations for Outpatient Follow-up:  1. Transition patient to residential hospice   Discharge Condition: Stable CODE STATUS: DNR/DNI Diet recommendation: Comfort feeding  Brief/Interim Summary: 86 year old with history of ESRD on hemodialysis Monday Wednesday Friday, COPD, bronchoalveolar carcinoma with metastatic disease, renal cell carcinoma status post left nephrectomy, HTN, right greater trochanteric fracture, GERD came to the ED with his niece for generalized weakness left greater than right.  On outpatient getting palliative chemo/radiation for brain metastases, on Decadron and Keppra.  In the ED noted to be clinically dehydrated, tachycardic, tachypneic.  Patient was gently hydrated.  Due to his weakness repeat MRI of the brain was done which showed progression of vasogenic edema on the right posterior hemisphere secondary to metastatic disease.  There was also concerns of swelling around his left upper extremity fistula site therefore ultrasound was done which was negative for any acute pathology.  Given his advanced disease and also been on dialysis, goals of care were discussed with the patient and his healthcare power of attorney, niece.  Eventually patient was transition to hospice and dialysis was stopped.   Patient will be transition to residential hospice when bed is available.  Body mass index is 30.28 kg/m.         Discharge Diagnoses:  Active Problems:   End stage renal disease (HCC)   Swollen arm   COPD (chronic obstructive pulmonary disease) (HCC)   GERD (gastroesophageal reflux disease)   Metastasis to brain (HCC)   Generalized weakness   AF (paroxysmal atrial fibrillation) (HCC)     Subjective: Patient seen and examined at bedside  this afternoon, his older niece is also at bedside.  Patient states he feels slightly better compared to yesterday and does not have any complaints.  He understands that he will be transition to residential hospice whenever bed is available as he accepts that he does not want any further dialysis once discharged from the hospital.  Discharge Exam: Vitals:   12/26/19 1245 12/26/19 1300  BP: (!) 105/56 (!) 89/53  Pulse: 89 90  Resp:    Temp:    SpO2:     Vitals:   12/26/19 1215 12/26/19 1230 12/26/19 1245 12/26/19 1300  BP: 100/65 (!) 92/55 (!) 105/56 (!) 89/53  Pulse: 95 93 89 90  Resp:      Temp:      TempSrc:      SpO2:      Weight:      Height:        General: Pt is alert, awake, not in acute distress, elderly frail Cardiovascular: RRR, S1/S2 +, no rubs, no gallops Respiratory: CTA bilaterally, no wheezing, no rhonchi Abdominal: Soft, NT, ND, bowel sounds + Extremities: no edema, no cyanosis, left upper extremity fistula noted  Discharge Instructions  Discharge Instructions    Discharge patient   Complete by: As directed    Discharge disposition: 51-Hospice/Medical Facility   Discharge patient date: 12/26/2019     Allergies as of 12/26/2019   No Known Allergies     Medication List    STOP taking these medications   cinacalcet 60 MG tablet Commonly known as: SENSIPAR   dexamethasone 4 MG tablet Commonly known as: DECADRON     TAKE these medications   acetaminophen 325 MG tablet Commonly known as: TYLENOL Take 2 tablets (650  mg total) by mouth every 6 (six) hours as needed for mild pain or moderate pain.   ALPRAZolam 0.5 MG tablet Commonly known as: Xanax Take 1 tablet (0.5 mg total) by mouth 3 (three) times daily as needed for sleep or anxiety.   cephALEXin 500 MG capsule Commonly known as: KEFLEX Take 1 capsule (500 mg total) by mouth 4 (four) times daily for 5 days.   Fluticasone-Salmeterol 100-50 MCG/DOSE Aepb Commonly known as: ADVAIR Inhale 1  puff into the lungs in the morning and at bedtime.   levETIRAcetam 500 MG tablet Commonly known as: KEPPRA Take 1 tablet (500 mg total) by mouth 2 (two) times daily.   lidocaine-prilocaine cream Commonly known as: EMLA Apply 1 application topically every Monday, Wednesday, and Friday with hemodialysis.   metoprolol succinate 25 MG 24 hr tablet Commonly known as: TOPROL-XL Take 25 mg by mouth daily.   omeprazole 20 MG capsule Commonly known as: PRILOSEC Take 20 mg by mouth daily.   oxycodone 5 MG capsule Commonly known as: OXY-IR Take 1 capsule (5 mg total) by mouth every 4 (four) hours as needed for pain.   Sennosides-Docusate Sodium 8.6-50 MG Caps Take 1 capsule by mouth in the morning and at bedtime.   Ventolin HFA 108 (90 Base) MCG/ACT inhaler Generic drug: albuterol Inhale 2 puffs into the lungs every 4 (four) hours as needed for wheezing or shortness of breath.       Follow-up Information    Sharilyn Sites, MD. Schedule an appointment as soon as possible for a visit in 1 week(s).   Specialty: Family Medicine Contact information: [place] [address] [phone]        Arnoldo Lenis, MD .   Specialty: Cardiology Contact information: [address] [place] [address] [phone]              No Known Allergies  You were cared for by a hospitalist during your hospital stay. If you have any questions about your discharge medications or the care you received while you were in the hospital after you are discharged, you can call the unit and asked to speak with the hospitalist on call if the hospitalist that took care of you is not available. Once you are discharged, your primary care physician will handle any further medical issues. Please note that no refills for any discharge medications will be authorized once you are discharged, as it is imperative that you return to your primary care physician (or establish a relationship  with a primary care physician if you do not have one) for your aftercare needs so that they can reassess your need for medications and monitor your lab values.   Procedures/Studies: DG Chest 2 View  Result Date: 12/24/2019 CLINICAL DATA:  86 year old male with weakness and fatigue. EXAM: CHEST - 2 VIEW COMPARISON:  Chest radiograph dated 05/30/2017. FINDINGS: There is a small left pleural effusion and left lung base patchy opacity which may represent atelectasis but concerning for pneumonia. Clinical correlation recommended. The right lung is clear. No pneumothorax. Stable cardiac silhouette. Atherosclerotic calcification of the aorta. No acute osseous pathology. Degenerative changes of the spine. IMPRESSION: Small left pleural effusion and left lung base patchy opacity concerning for pneumonia. Electronically Signed   By: Anner Crete M.D.   On: 12/24/2019 18:53   CT Head Wo Contrast  Result Date: 12/24/2019 CLINICAL DATA:  Has history of brain cancer and had a treatment 2 weeks ago, according to daughter he has  been getting weaker on the left side, he had pain down his left side to his buttock. Patient took an oxycodone at 1am and it relieved his pain. Patient took another before dialysis. Getting into car patient was unable to lift his left leg on his own. EXAM: CT HEAD WITHOUT CONTRAST TECHNIQUE: Contiguous axial images were obtained from the base of the skull through the vertex without intravenous contrast. COMPARISON:  11/21/2019, head CT and brain MRI. Brain MRI, 11/27/2019. FINDINGS: Brain: Low-attenuation reflecting vasogenic edema extends throughout the right parietal lobe involving the adjacent posterior right frontal lobe and involves a smaller area of the left frontal lobe. This is unchanged compared to the prior head CT. There is slight, 1-2 mm, midline shift of the posterosuperior falx cerebri, also stable. The metastatic lesions which were noted in the right parietal lobe, left frontal  lobe and superior right cerebellum are not defined on CT. There are no new areas of vasogenic edema to suggest new metastatic lesions. There is no hydrocephalus. There is no evidence of acute/recent infarction. There are no extra-axial masses or abnormal fluid collections. There is no intracranial hemorrhage. Additional white matter hypoattenuation is noted bilaterally consistent with chronic microvascular ischemic change. Vascular: No hyperdense vessel or unexpected calcification. Skull: Normal. Negative for fracture or focal lesion. Sinuses/Orbits: Globes and orbits are unremarkable. The visualized sinuses show mild maxillary sinus mucosal thickening but are otherwise clear. Other: None. IMPRESSION: 1. No interval change in the head CT appearance from the study dated 11/21/2019. The right parietal and left frontal lobe metastatic lesions are not defined, but the significant vasogenic edema is well depicted and appears unchanged in extent. There is minimal stable mass effect from the right parietal lesion slightly deviating the superior falx cerebri to the left. Electronically Signed   By: Lajean Manes M.D.   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 Reportedly undergoing radiation treatment - although no definite evidence of SRS treatment in the EMR [facility] was recommended by radiation oncology last month). Neurologic deficit.  Dialysis patient. EXAM: MRI HEAD WITHOUT CONTRAST TECHNIQUE: Multiplanar, multiecho pulse sequences of the brain and surrounding structures were obtained without intravenous contrast. COMPARISON:  SRS treatment planning brain MRI 11/27/2019. FINDINGS: Brain: No restricted diffusion suggestive of acute infarction. Metastatic disease related vasogenic edema in the anterior left frontal lobe has not significantly changed since 11/27/2019. However, edema has mildly increased in the posterior  right hemisphere related to a parietal lobe metastasis (series 12, image 25 today with the primary mass faintly evident on series 10, image 22). Small right cerebellar mass remain stable without significant edema. No intracranial mass effect. No midline shift and basilar cisterns remain normal. Stable ventricle size and configuration. Occasional chronic micro hemorrhages again noted. No acute intracranial hemorrhage identified. Negative pituitary and cervicomedullary junction. Additional nonspecific cerebral white matter and deep gray nuclei T2 and FLAIR heterogeneity is stable. 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 Vascular: Major intracranial vascular flow voids are stable. Skull and upper cervical spine: Stable and negative for age visible cervical spine. Visualized bone marrow signal is within normal limits. Sinuses/Orbits: Stable, negative. Other: Mastoids remain well pneumatized. Scalp and face appear negative. IMPRESSION: 1. Mildly progressed vasogenic edema in the posterior right hemisphere related to the dominant brain metastasis demonstrated on 11/27/2019. 2. Stable edema in the anterior left frontal lobe at the site of two smaller metastases.  No new areas of cerebral edema. 3. No evidence of acute infarct. Electronically Signed   By: Genevie Ann M.D.   On: 12/25/2019 15:53   MR BRAIN W WO CONTRAST  Result Date: 11/27/2019 CLINICAL DATA:  Lung cancer with metastatic disease to brain. SRS treatment planning. EXAM: MRI HEAD WITHOUT AND WITH CONTRAST TECHNIQUE: Multiplanar, multiecho pulse sequences of the brain and surrounding structures were obtained without and with intravenous contrast. CONTRAST:  74mL GADAVIST GADOBUTROL 1 MMOL/ML IV SOLN COMPARISON:  MRI head without contrast 11/21/2019 FINDINGS: Brain: Four enhancing metastatic deposits in the brain. Image quality degraded by motion. 12 mm enhancing mass left frontal lobe with mild to moderate surrounding edema 4 mm enhancing lesion  left frontal lobe with mild edema 14 mm enhancing lesion right posterior parietal lobe with moderate surrounding edema 3 mm enhancing lesion in the right superior cerebellum without significant edema. Moderate atrophy.  Negative for acute infarct.  No midline shift. Multiple foci of chronic microhemorrhage in the brain including the right cerebellum, right posterior temporal lobe, right occipital lobe and frontal parietal white matter. Vascular: Normal arterial flow voids Skull and upper cervical spine: No focal skeletal lesions. Sinuses/Orbits: Mild mucosal edema right maxillary sinus. Negative orbit Other: None IMPRESSION: 1. Four metastatic deposits in the brain including 2 in the left frontal lobe, 1 in the right parietal lobe and 1 in the right cerebellum. Vasogenic edema in the left frontal lobe and right parietal lobe. No midline shift. 2. Atrophy. Multiple foci of chronic microhemorrhage. Correlate with history of hypertension. Electronically Signed   By: Franchot Gallo M.D.   On: 11/27/2019 19:14   US Carotid Bilateral  Result Date: 12/25/2019 CLINICAL DATA:  Hypertension, stroke symptoms EXAM: BILATERAL CAROTID DUPLEX ULTRASOUND TECHNIQUE: Pearline Cables scale imaging, color Doppler and duplex ultrasound were performed of bilateral carotid and vertebral arteries in the neck. COMPARISON:  None. FINDINGS: Criteria: Quantification of carotid stenosis is based on velocity parameters that correlate the residual internal carotid diameter with NASCET-based stenosis levels, using the diameter of the distal internal carotid lumen as the denominator for stenosis measurement. The following velocity measurements were obtained: RIGHT ICA: 54/11 cm/sec CCA: 25/4 cm/sec SYSTOLIC ICA/CCA RATIO:  0.7 ECA: 55 cm/sec LEFT ICA: 51/12 cm/sec CCA: 27/06 cm/sec SYSTOLIC ICA/CCA RATIO:  0.6 ECA: 62 cm/sec RIGHT CAROTID ARTERY: Minor echogenic shadowing plaque formation. No hemodynamically significant right ICA stenosis, velocity  elevation, or turbulent flow. Degree of narrowing less than 50%. RIGHT VERTEBRAL ARTERY:  Normal antegrade flow LEFT CAROTID ARTERY: Similar scattered minor echogenic plaque formation. No hemodynamically significant left ICA stenosis, velocity elevation, or turbulent flow. LEFT VERTEBRAL ARTERY:  Normal antegrade flow IMPRESSION: Minor carotid atherosclerosis. No hemodynamically significant ICA stenosis. Degree of narrowing less than 50% bilaterally by ultrasound criteria. Patent antegrade vertebral flow bilaterally Electronically Signed   By: Jerilynn Mages.  Shick M.D.   On: 12/25/2019 16:08   US Venous Img Upper Uni Left (DVT)  Result Date: 12/25/2019 CLINICAL DATA:  Left upper extremity edema. History of end-stage renal disease with left upper arm fistula. EXAM: LEFT UPPER EXTREMITY VENOUS DOPPLER ULTRASOUND TECHNIQUE: Gray-scale sonography with graded compression, as well as color Doppler and duplex ultrasound were performed to evaluate the upper extremity deep venous system from the level of the subclavian vein and including the jugular, axillary, basilic, radial, ulnar and upper cephalic vein. Spectral Doppler was utilized to evaluate flow at rest and with distal augmentation maneuvers. COMPARISON:  None. FINDINGS: Contralateral Subclavian Vein: Respiratory phasicity is normal and  symmetric with the symptomatic side. No evidence of thrombus. Normal compressibility. Internal Jugular Vein: No evidence of thrombus. Normal compressibility, respiratory phasicity and response to augmentation. Subclavian Vein: No evidence of thrombus. Normal compressibility, respiratory phasicity and response to augmentation. Axillary Vein: No evidence of thrombus. Normal compressibility, respiratory phasicity and response to augmentation. Cephalic Vein: No evidence of thrombus. Normal compressibility, respiratory phasicity and response to augmentation. Basilic Vein: No evidence of thrombus. Normal compressibility, respiratory phasicity and  response to augmentation. Brachial Veins: No evidence of thrombus. Normal compressibility, respiratory phasicity and response to augmentation. Radial Veins: No evidence of thrombus. Normal compressibility, respiratory phasicity and response to augmentation. Ulnar Veins: No evidence of thrombus. Normal compressibility, respiratory phasicity and response to augmentation. Venous Reflux:  None visualized. Other Findings: No evidence of superficial thrombophlebitis. Image portion of a left upper arm AV fistula appears patent. Formal evaluation of the entire fistula was not performed. IMPRESSION: No evidence of DVT within the left upper extremity. Electronically Signed   By: Aletta Edouard M.D.   On: 12/25/2019 12:46   ECHOCARDIOGRAM COMPLETE  Result Date: 12/25/2019    ECHOCARDIOGRAM REPORT   Patient Name:   [name] Seefeldt Date of Exam: 12/25/2019 Medical Rec #:  [id number]      Height:       66.0 in Accession #:    [id number]     Weight:       180.0 lb Date of Birth:  28-Mar-1931       BSA:          1.912 m Patient Age:    50 years       BP:           138/64 mmHg Patient Gender: M              HR:           102 bpm. Exam Location:  [facility] Procedure: 2D Echo Indications:    Stroke 434.91 / I163.9  History:        Patient has prior history of Echocardiogram examinations, most                 recent 01/05/2018. COPD, Arrythmias:Atrial Fibrillation; Risk                 Factors:Hypertension. ESRD, GERD, Bronchoalveolar carcinoma ,                 Metastasis to brain.  Sonographer:    Leavy Cella RDCS (AE) Referring Phys: [id number] OLADAPO ADEFESO IMPRESSIONS  1. Left ventricular ejection fraction, by estimation, is 60 to 65%. The left ventricle has normal function. The left ventricle has no regional wall motion abnormalities. There is moderate left ventricular hypertrophy. Left ventricular diastolic parameters are indeterminate.  2. Right ventricular systolic function is normal. The right ventricular size is normal.  There is mildly elevated pulmonary artery systolic pressure. The estimated right ventricular systolic pressure is 17.6 mmHg.  3. Left atrial size was mild to moderately dilated.  4. Right atrial size was upper normal.  5. The mitral valve is grossly normal. Trivial mitral valve regurgitation.  6. The aortic valve is tricuspid. Aortic valve regurgitation is trivial. Mild aortic valve sclerosis is present, with no evidence of aortic valve stenosis.  7. The inferior vena cava is normal in size with greater than 50% respiratory variability, suggesting right atrial pressure of 3 mmHg. FINDINGS  Left Ventricle: Left ventricular ejection fraction, by estimation, is 60 to 65%. The left ventricle  has normal function. The left ventricle has no regional wall motion abnormalities. The left ventricular internal cavity size was normal in size. There is  moderate left ventricular hypertrophy. Left ventricular diastolic parameters are indeterminate. Right Ventricle: The right ventricular size is normal. No increase in right ventricular wall thickness. Right ventricular systolic function is normal. There is mildly elevated pulmonary artery systolic pressure. The tricuspid regurgitant velocity is 3.00  m/s, and with an assumed right atrial pressure of 3 mmHg, the estimated right ventricular systolic pressure is 68.3 mmHg. Left Atrium: Left atrial size was mild to moderately dilated. Right Atrium: Right atrial size was upper normal. Pericardium: There is no evidence of pericardial effusion. Presence of pericardial fat pad. Mitral Valve: The mitral valve is grossly normal. Mild mitral annular calcification. Trivial mitral valve regurgitation. Tricuspid Valve: The tricuspid valve is grossly normal. Tricuspid valve regurgitation is trivial. Aortic Valve: The aortic valve is tricuspid. Aortic valve regurgitation is trivial. Mild aortic valve sclerosis is present, with no evidence of aortic valve stenosis. Mild aortic valve annular  calcification. There is mild calcification of the aortic valve. Pulmonic Valve: The pulmonic valve was grossly normal. Pulmonic valve regurgitation is trivial. Aorta: The aortic root is normal in size and structure. Venous: The inferior vena cava is normal in size with greater than 50% respiratory variability, suggesting right atrial pressure of 3 mmHg. IAS/Shunts: No atrial level shunt detected by color flow Doppler.  LEFT VENTRICLE PLAX 2D LVIDd:         3.37 cm  Diastology LVIDs:         1.84 cm  LV e' lateral:   14.60 cm/s LV PW:         1.56 cm  LV E/e' lateral: 8.2 LV IVS:        1.75 cm  LV e' medial:    9.14 cm/s LVOT diam:     2.00 cm  LV E/e' medial:  13.1 LVOT Area:     3.14 cm  RIGHT VENTRICLE RV S prime:     25.00 cm/s TAPSE (M-mode): 2.4 cm LEFT ATRIUM             Index       RIGHT ATRIUM           Index LA diam:        2.80 cm 1.46 cm/m  RA Area:     21.80 cm LA Vol (A2C):   97.7 ml 51.09 ml/m RA Volume:   63.30 ml  33.10 ml/m LA Vol (A4C):   73.4 ml 38.38 ml/m LA Biplane Vol: 87.9 ml 45.97 ml/m   AORTA Ao Root diam: 3.60 cm MITRAL VALVE                TRICUSPID VALVE MV Area (PHT): 4.18 cm     TR Peak grad:   36.0 mmHg MV Decel Time: 181 msec     TR Vmax:        300.00 cm/s MV E velocity: 119.67 cm/s                             SHUNTS                             Systemic Diam: 2.00 cm Rozann Lesches MD Electronically signed by Rozann Lesches MD Signature Date/Time: 12/25/2019/11:37:35 AM    Final       The  results of significant diagnostics from this hospitalization (including imaging, microbiology, ancillary and laboratory) are listed below for reference.     Microbiology: Recent Results (from the past 240 hour(s))  SARS CORONAVIRUS 2 (TAT 6-24 HRS) Nasopharyngeal Nasopharyngeal Swab     Status: None   Collection Time: 12/24/19  7:08 PM   Specimen: Nasopharyngeal Swab  Result Value Ref Range Status   SARS Coronavirus 2 NEGATIVE NEGATIVE Final    Comment: (NOTE) SARS-CoV-2  target nucleic acids are NOT DETECTED.  The SARS-CoV-2 RNA is generally detectable in upper and lower respiratory specimens during the acute phase of infection. Negative results do not preclude SARS-CoV-2 infection, do not rule out co-infections with other pathogens, and should not be used as the sole basis for treatment or other patient management decisions. Negative results must be combined with clinical observations, patient history, and epidemiological information. The expected result is Negative.  Fact Sheet for Patients: SugarRoll.be  Fact Sheet for Healthcare Providers: https://www.woods-mathews.com/  This test is not yet approved or cleared by the [place] FDA and  has been authorized for detection and/or diagnosis of SARS-CoV-2 by FDA under an Emergency Use Authorization (EUA). This EUA will remain  in effect (meaning this test can be used) for the duration of the COVID-19 declaration under Se ction 564(b)(1) of the Act, 21 U.S.C. section 360bbb-3(b)(1), unless the authorization is terminated or revoked sooner.  Performed at [facility], [place] [address] [place], [place] [address]      Labs: BNP (last 3 results) No results for input(s): BNP in the last 8760 hours. Basic Metabolic Panel: Recent Labs  Lab 12/24/19 1516 12/25/19 0549 12/26/19 0612  NA 135 133* 130*  K 4.9 4.8 5.8*  CL 98 96* 96*  CO2 24 24 21*  GLUCOSE 120* 115* 180*  BUN 59* 69* 94*  CREATININE 5.51* 6.58* 8.19*  CALCIUM 7.7* 7.9* 8.1*  MG  --  2.1 2.2  PHOS  --  5.8*  --    Liver Function Tests: Recent Labs  Lab 12/24/19 1516 12/25/19 0549  AST 13* 9*  ALT 19 17  ALKPHOS 76 70  BILITOT 0.8 0.8  PROT 5.9* 5.5*  ALBUMIN 3.0* 2.8*   Recent Labs  Lab 12/24/19 1516  LIPASE 69*   No results for input(s): AMMONIA in the last 168 hours. CBC: Recent Labs  Lab 12/24/19 1516 12/25/19 0549 12/26/19 0612  WBC 5.5 5.1 4.4   NEUTROABS 4.5  --   --   HGB 11.3* 10.7* 9.3*  HCT 35.0* 34.3* 29.1*  MCV 102.0* 104.9* 101.4*  PLT 84* 53* 68*   Cardiac Enzymes: No results for input(s): CKTOTAL, CKMB, CKMBINDEX, TROPONINI in the last 168 hours. BNP: Invalid input(s): POCBNP CBG: Recent Labs  Lab 12/24/19 1515  GLUCAP 120*   D-Dimer No results for input(s): DDIMER in the last 72 hours. Hgb A1c No results for input(s): HGBA1C in the last 72 hours. Lipid Profile No results for input(s): CHOL, HDL, LDLCALC, TRIG, CHOLHDL, LDLDIRECT in the last 72 hours. Thyroid function studies No results for input(s): TSH, T4TOTAL, T3FREE, THYROIDAB in the last 72 hours.  Invalid input(s): FREET3 Anemia work up No results for input(s): VITAMINB12, FOLATE, FERRITIN, TIBC, IRON, RETICCTPCT in the last 72 hours. Urinalysis    Component Value Date/Time   COLORURINE YELLOW 12/24/2019 1536   APPEARANCEUR HAZY (A) 12/24/2019 1536   LABSPEC 1.013 12/24/2019 1536   PHURINE 6.0 12/24/2019 1536   GLUCOSEU 50 (A) 12/24/2019 1536  HGBUR LARGE (A) 12/24/2019 1536   BILIRUBINUR NEGATIVE 12/24/2019 1536   [place] 12/24/2019 1536   PROTEINUR 100 (A) 12/24/2019 1536   UROBILINOGEN 0.2 01/01/2009 1054   NITRITE NEGATIVE 12/24/2019 1536   LEUKOCYTESUR LARGE (A) 12/24/2019 1536   Sepsis Labs Invalid input(s): PROCALCITONIN,  WBC,  LACTICIDVEN Microbiology Recent Results (from the past 240 hour(s))  SARS CORONAVIRUS 2 (TAT 6-24 HRS) Nasopharyngeal Nasopharyngeal Swab     Status: None   Collection Time: 12/24/19  7:08 PM   Specimen: Nasopharyngeal Swab  Result Value Ref Range Status   SARS Coronavirus 2 NEGATIVE NEGATIVE Final    Comment: (NOTE) SARS-CoV-2 target nucleic acids are NOT DETECTED.  The SARS-CoV-2 RNA is generally detectable in upper and lower respiratory specimens during the acute phase of infection. Negative results do not preclude SARS-CoV-2 infection, do not rule out co-infections with other  pathogens, and should not be used as the sole basis for treatment or other patient management decisions. Negative results must be combined with clinical observations, patient history, and epidemiological information. The expected result is Negative.  Fact Sheet for Patients: SugarRoll.be  Fact Sheet for Healthcare Providers: https://www.woods-mathews.com/  This test is not yet approved or cleared by the [place] FDA and  has been authorized for detection and/or diagnosis of SARS-CoV-2 by FDA under an Emergency Use Authorization (EUA). This EUA will remain  in effect (meaning this test can be used) for the duration of the COVID-19 declaration under Se ction 564(b)(1) of the Act, 21 U.S.C. section 360bbb-3(b)(1), unless the authorization is terminated or revoked sooner.  Performed at [facility], [place] [address], [place], [place] [address]      Time coordinating discharge:  I have spent 35 minutes face to face with the patient and on the ward discussing the patients care, assessment, plan and disposition with other care givers. >50% of the time was devoted counseling the patient about the risks and benefits of treatment/Discharge disposition and coordinating care.   SIGNED:   Damita Lack, MD  [facility] 12/26/2019, 1:10 PM   If 7PM-7AM, please contact night-coverage

## 2019-12-27 NOTE — Progress Notes (Signed)
PROGRESS NOTE    [name]  SJG:[id number] DOB: 02-17-1931 DOA: 12/26/2019 PCP: Sharilyn Sites, MD   Brief Narrative:  86 year old with history of ESRD on hemodialysis Monday Wednesday Friday, COPD, bronchoalveolar carcinoma with metastatic disease, renal cell carcinoma status post left nephrectomy, HTN, right greater trochanteric fracture, GERD initially came to the ER with generalized weakness. After extensive conversation was transitioned to hospice, currently awaiting bed at hospice facility.   Assessment & Plan:   Active Problems:   Metastatic non-small cell lung cancer (HCC)  Generalized weakness left greater than right -Currently seems okay. Now patient is hospice/comfort care.  Paroxysmal atrial fibrillation -On Toprol  ESRD on hemodialysis MWF -Dialysis has been stopped by nephrology. 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 Benay Spice.  On Keppra and Decadron given metastatic disease to the brain  GERD -PPI  Essential hypertension -Metoprolol  Anemia of chronic disease, macrocytosis -Hemoglobin remained stable.  No evidence of bleeding  History of COPD -As needed bronchodilators  Currently patient is awaiting bed     DVT prophylaxis: SCDs Code Status: DNR Family Communication: Niece at bedside  Patient is medically stable to be transition to residential hospice awaiting bed   There is no height or weight on file to calculate BMI.        Subjective: Seen and examined at bedside, no complaints. He is in good spirits. Niece at bedside Review of Systems Otherwise negative except as per HPI, including: General: Denies fever, chills, night sweats or unintended weight loss. Resp: Denies cough, wheezing, shortness of breath. Cardiac:  Denies chest pain, palpitations, orthopnea, paroxysmal nocturnal dyspnea. GI: Denies abdominal pain, nausea, vomiting, diarrhea or constipation GU: Denies dysuria, frequency, hesitancy or incontinence MS: Denies muscle aches, joint pain or swelling Neuro: Denies headache, neurologic deficits (focal weakness, numbness, tingling), abnormal gait Psych: Denies anxiety, depression, SI/HI/AVH Skin: Denies new rashes or lesions ID: Denies sick contacts, exotic exposures, travel  Examination: Constitutional: Not in acute distress, is in good spirits Respiratory: Clear to auscultation bilaterally Cardiovascular: Normal sinus rhythm, no rubs Abdomen: Nontender nondistended good bowel sounds Musculoskeletal: No edema noted Skin: No rashes seen Neurologic: CN 2-12 grossly intact.  And nonfocal Psychiatric: Normal judgment and insight. Alert and oriented x 3. Normal mood.  Objective: Vitals:   12/27/19 0728 12/27/19 0933  BP:  (!) 102/55  Pulse:  92  SpO2: 98%    No intake or output data in the 24 hours ending 12/27/19 1203 There were no vitals filed for this visit.   Data Reviewed:   CBC: Recent Labs  Lab 12/24/19 1516 12/25/19 0549 12/26/19 0612  WBC 5.5 5.1 4.4  NEUTROABS 4.5  --   --   HGB 11.3* 10.7* 9.3*  HCT 35.0* 34.3* 29.1*  MCV 102.0* 104.9* 101.4*  PLT 84* 53* 68*   Basic Metabolic Panel: Recent Labs  Lab 12/24/19 1516 12/25/19 0549 12/26/19 0612  NA 135 133* 130*  K 4.9 4.8 5.8*  CL 98 96* 96*  CO2 24 24 21*  GLUCOSE 120* 115* 180*  BUN 59* 69* 94*  CREATININE 5.51* 6.58* 8.19*  CALCIUM 7.7* 7.9* 8.1*  MG  --  2.1 2.2  PHOS  --  5.8*  --    GFR: Estimated Creatinine Clearance: 6.5 mL/min (A) (by C-G formula based on SCr of 8.19 mg/dL (H)). Liver Function Tests:  Recent Labs  Lab 12/24/19 1516 12/25/19 0549  AST 13* 9*  ALT 19 17  ALKPHOS 76 70  BILITOT 0.8 0.8  PROT 5.9* 5.5*  ALBUMIN 3.0* 2.8*   Recent Labs  Lab 12/24/19 1516  LIPASE 69*    No results for input(s): AMMONIA in the last 168 hours. Coagulation Profile: Recent Labs  Lab 12/24/19 2132 12/25/19 0549  INR 1.0 1.0   Cardiac Enzymes: No results for input(s): CKTOTAL, CKMB, CKMBINDEX, TROPONINI in the last 168 hours. BNP (last 3 results) No results for input(s): PROBNP in the last 8760 hours. HbA1C: No results for input(s): HGBA1C in the last 72 hours. CBG: Recent Labs  Lab 12/24/19 1515  GLUCAP 120*   Lipid Profile: No results for input(s): CHOL, HDL, LDLCALC, TRIG, CHOLHDL, LDLDIRECT in the last 72 hours. Thyroid Function Tests: No results for input(s): TSH, T4TOTAL, FREET4, T3FREE, THYROIDAB in the last 72 hours. Anemia Panel: No results for input(s): VITAMINB12, FOLATE, FERRITIN, TIBC, IRON, RETICCTPCT in the last 72 hours. Sepsis Labs: No results for input(s): PROCALCITON, LATICACIDVEN in the last 168 hours.  Recent Results (from the past 240 hour(s))  SARS CORONAVIRUS 2 (TAT 6-24 HRS) Nasopharyngeal Nasopharyngeal Swab     Status: None   Collection Time: 12/24/19  7:08 PM   Specimen: Nasopharyngeal Swab  Result Value Ref Range Status   SARS Coronavirus 2 NEGATIVE NEGATIVE Final    Comment: (NOTE) SARS-CoV-2 target nucleic acids are NOT DETECTED.  The SARS-CoV-2 RNA is generally detectable in upper and lower respiratory specimens during the acute phase of infection. Negative results do not preclude SARS-CoV-2 infection, do not rule out co-infections with other pathogens, and should not be used as the sole basis for treatment or other patient management decisions. Negative results must be combined with clinical observations, patient history, and epidemiological information. The expected result is Negative.  Fact Sheet for Patients: SugarRoll.be  Fact Sheet for Healthcare Providers: https://www.woods-mathews.com/  This test is not yet approved or cleared by the [place] FDA and  has been  authorized for detection and/or diagnosis of SARS-CoV-2 by FDA under an Emergency Use Authorization (EUA). This EUA will remain  in effect (meaning this test can be used) for the duration of the COVID-19 declaration under Se ction 564(b)(1) of the Act, 21 U.S.C. section 360bbb-3(b)(1), unless the authorization is terminated or revoked sooner.  Performed at [facility], [place] [address] [place], [place] [address]          Radiology Studies: MR BRAIN WO CONTRAST  Result Date: 12/25/2019 CLINICAL DATA:  86 year old male with lung cancer metastatic to the brain. Reportedly undergoing radiation treatment - although no definite evidence of SRS treatment in the EMR [facility] was recommended by radiation oncology last month). Neurologic deficit.  Dialysis patient. EXAM: MRI HEAD WITHOUT CONTRAST TECHNIQUE: Multiplanar, multiecho pulse sequences of the brain and surrounding structures were obtained without intravenous contrast. COMPARISON:  SRS treatment planning brain MRI 11/27/2019. FINDINGS: Brain: No restricted diffusion suggestive of acute infarction. Metastatic disease related vasogenic edema in the anterior left frontal lobe has not significantly changed since 11/27/2019. However, edema has mildly increased in the posterior right hemisphere related to a parietal lobe metastasis (series 12, image 25 today with the primary mass faintly evident on series 10, image 22). Small right cerebellar mass remain stable without significant edema. No intracranial mass effect. No midline shift and basilar cisterns remain normal. Stable ventricle size and configuration. Occasional chronic micro hemorrhages again noted. No acute intracranial  hemorrhage identified. Negative pituitary and cervicomedullary junction. Additional nonspecific cerebral white matter and deep gray nuclei T2 and FLAIR heterogeneity is stable. 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 Vascular: Major intracranial vascular flow  voids are stable. Skull and upper cervical spine: Stable and negative for age visible cervical spine. Visualized bone marrow signal is within normal limits. Sinuses/Orbits: Stable, negative. Other: Mastoids remain well pneumatized. Scalp and face appear negative. IMPRESSION: 1. Mildly progressed vasogenic edema in the posterior right hemisphere related to the dominant brain metastasis demonstrated on 11/27/2019. 2. Stable edema in the anterior left frontal lobe at the site of two smaller metastases. No new areas of cerebral edema. 3. No evidence of acute infarct. Electronically Signed   By: Genevie Ann M.D.   On: 12/25/2019 15:53   US Carotid Bilateral  Result Date: 12/25/2019 CLINICAL DATA:  Hypertension, stroke symptoms EXAM: BILATERAL CAROTID DUPLEX ULTRASOUND TECHNIQUE: Pearline Cables scale imaging, color Doppler and duplex ultrasound were performed of bilateral carotid and vertebral arteries in the neck. COMPARISON:  None. FINDINGS: Criteria: Quantification of carotid stenosis is based on velocity parameters that correlate the residual internal carotid diameter with NASCET-based stenosis levels, using the diameter of the distal internal carotid lumen as the denominator for stenosis measurement. The following velocity measurements were obtained: RIGHT ICA: 54/11 cm/sec CCA: 16/0 cm/sec SYSTOLIC ICA/CCA RATIO:  0.7 ECA: 55 cm/sec LEFT ICA: 51/12 cm/sec CCA: 10/93 cm/sec SYSTOLIC ICA/CCA RATIO:  0.6 ECA: 62 cm/sec RIGHT CAROTID ARTERY: Minor echogenic shadowing plaque formation. No hemodynamically significant right ICA stenosis, velocity elevation, or turbulent flow. Degree of narrowing less than 50%. RIGHT VERTEBRAL ARTERY:  Normal antegrade flow LEFT CAROTID ARTERY: Similar scattered minor echogenic plaque formation. No hemodynamically significant left ICA stenosis, velocity elevation, or turbulent flow. LEFT VERTEBRAL ARTERY:  Normal antegrade flow IMPRESSION: Minor carotid atherosclerosis. No hemodynamically significant  ICA stenosis. Degree of narrowing less than 50% bilaterally by ultrasound criteria. Patent antegrade vertebral flow bilaterally Electronically Signed   By: Jerilynn Mages.  Shick M.D.   On: 12/25/2019 16:08   US Venous Img Upper Uni Left (DVT)  Result Date: 12/25/2019 CLINICAL DATA:  Left upper extremity edema. History of end-stage renal disease with left upper arm fistula. EXAM: LEFT UPPER EXTREMITY VENOUS DOPPLER ULTRASOUND TECHNIQUE: Gray-scale sonography with graded compression, as well as color Doppler and duplex ultrasound were performed to evaluate the upper extremity deep venous system from the level of the subclavian vein and including the jugular, axillary, basilic, radial, ulnar and upper cephalic vein. Spectral Doppler was utilized to evaluate flow at rest and with distal augmentation maneuvers. COMPARISON:  None. FINDINGS: Contralateral Subclavian Vein: Respiratory phasicity is normal and symmetric with the symptomatic side. No evidence of thrombus. Normal compressibility. Internal Jugular Vein: No evidence of thrombus. Normal compressibility, respiratory phasicity and response to augmentation. Subclavian Vein: No evidence of thrombus. Normal compressibility, respiratory phasicity and response to augmentation. Axillary Vein: No evidence of thrombus. Normal compressibility, respiratory phasicity and response to augmentation. Cephalic Vein: No evidence of thrombus. Normal compressibility, respiratory phasicity and response to augmentation. Basilic Vein: No evidence of thrombus. Normal compressibility, respiratory phasicity and response to augmentation. Brachial Veins: No evidence of thrombus. Normal compressibility, respiratory phasicity and response to augmentation. Radial Veins: No evidence of thrombus. Normal compressibility, respiratory phasicity and response to augmentation. Ulnar Veins: No evidence of thrombus. Normal compressibility, respiratory phasicity and response to augmentation. Venous Reflux:  None  visualized. Other Findings: No evidence of superficial thrombophlebitis. Image portion of a left upper arm  AV fistula appears patent. Formal evaluation of the entire fistula was not performed. IMPRESSION: No evidence of DVT within the left upper extremity. Electronically Signed   By: Aletta Edouard M.D.   On: 12/25/2019 12:46        Scheduled Meds: . arformoterol  15 mcg Nebulization BID  . budesonide (PULMICORT) nebulizer solution  0.25 mg Nebulization BID  . cephALEXin  500 mg Oral Q12H  . levETIRAcetam  500 mg Oral BID  . [START ON 12/28/2019] lidocaine-prilocaine  1 application Topical Q M,W,F-HD  . metoprolol succinate  25 mg Oral Daily  . pantoprazole  40 mg Oral Daily  . senna-docusate  1 tablet Oral BID   Continuous Infusions:   LOS: 1 day   Time spent= 15 mins    Laird Runnion Arsenio Loader, MD [facility]  If 7PM-7AM, please contact night-coverage  12/27/2019, 12:03 PM

## 2019-12-27 NOTE — TOC Progression Note (Signed)
Transition of Care [facility]) - Progression Note    Patient Details  Name: [name] MRN: [id number] Date of Birth: 1930/06/25  Transition of Care [facility]) CM/SW Contact  Salome Arnt, [place] Phone Number: 12/27/2019, 9:14 AM  Clinical Narrative:  LCSW spoke with Cassandra at [facility]. No beds available at [facility] at this time. Cassandra will notify TOC when bed available for transfer.        Barriers to Discharge: Hospice Bed not available  Expected Discharge Plan and Services                                                 Social Determinants of Health (SDOH) Interventions    Readmission Risk Interventions Readmission Risk Prevention Plan 12/25/2019 11/27/2019  Transportation Screening Complete Complete  PCP or Specialist Appt within 3-5 Days Not Complete -  Not Complete comments Awaiting results for MRI and tests to determine what the patient wants to do regarding his care -  [place] or [place] Complete Complete  Social Work Consult for [place] Planning/Counseling Complete Complete  Palliative Care Screening Complete Not Applicable  Medication Review Press photographer) - Complete  Some recent data might be hidden

## 2019-12-28 MED ORDER — LORAZEPAM 2 MG/ML IJ SOLN
0.5000 mg | Freq: Four times a day (QID) | INTRAMUSCULAR | Status: DC | PRN
  Administered 2019-12-28: 0.5 mg via INTRAVENOUS
  Filled 2019-12-28: qty 1

## 2019-12-28 NOTE — Plan of Care (Signed)

## 2019-12-28 NOTE — Progress Notes (Signed)
Seen and examined at bedside, no complaints. Niece at bedside.  Vitals are stable.   Awaiting bed at Hospice.   Gerlean Ren MD [facility]

## 2019-12-28 NOTE — TOC Progression Note (Signed)
Transition of Care [facility]) - Progression Note   Patient Details  Name: [name] MRN: [id number] Date of Birth: 1930/11/10  Transition of Care [facility]) CM/SW [place], LCSW Phone Number: 12/28/2019, 11:37 AM  Clinical Narrative: CSW spoke with [place] at [facility]. Per Cassandra, there are no beds available at [facility] at this time. Cassandra to notify TOC when bed becomes. TOC to follow.  Barriers to Discharge: Hospice Bed not available  Expected Discharge Plan and Services Expected Discharge Date: 12/28/19                Readmission Risk Interventions Readmission Risk Prevention Plan 12/25/2019 11/27/2019  Transportation Screening Complete Complete  PCP or Specialist Appt within 3-5 Days Not Complete -  Not Complete comments Awaiting results for MRI and tests to determine what the patient wants to do regarding his care -  [place] or [place] Complete Complete  Social Work Consult for [place] Planning/Counseling Complete Complete  Palliative Care Screening Complete Not Applicable  Medication Review Press photographer) - Complete  Some recent data might be hidden

## 2019-12-29 NOTE — Progress Notes (Signed)
Feels ok, no complaints. Niece at bedside who is the POA.  Vitals stable.  Awaiting hospice bed.   Gerlean Ren MD [facility]

## 2019-12-30 LAB — SARS CORONAVIRUS 2 BY RT PCR (HOSPITAL ORDER, PERFORMED IN ~~LOC~~ HOSPITAL LAB): SARS Coronavirus 2: NEGATIVE

## 2019-12-30 MED ORDER — HYDROMORPHONE HCL 1 MG/ML IJ SOLN
0.5000 mg | INTRAMUSCULAR | Status: DC | PRN
  Administered 2019-12-30: 0.5 mg via INTRAVENOUS
  Filled 2019-12-30: qty 0.5

## 2019-12-30 NOTE — TOC Transition Note (Signed)
Transition of Care [facility]) - CM/SW Discharge Note   Patient Details  Name: [name] MRN: [id number] Date of Birth: Jan 27, 1931  Transition of Care [facility]) CM/SW Contact:  Natasha Bence, LCSW Phone Number: 12/30/2019, 12:26 PM   Clinical Narrative:    CSW contacted [facility] to verify that they were able to take GIP patient. Residential hospice agreeable to take patient. CSW contact patient's niece to notify her of patient's discharge. CSW has scheduled for EMS transport and completed med necessity form. Nurse to call report. TOC signing off.   Final next level of care: [place] Barriers to Discharge: Barriers Resolved   Patient Goals and CMS Choice Patient states their goals for this hospitalization and ongoing recovery are:: Residential Hospice CMS Medicare.gov Compare Post Acute Care list provided to:: Patient Choice offered to / list presented to : Patient, Adult Children  Discharge Placement                Patient to be transferred to facility by: [facility] Name of family member notified: [name] Patient and family notified of of transfer: 12/30/19  Discharge Plan and Services                                     Social Determinants of Health (SDOH) Interventions     Readmission Risk Interventions Readmission Risk Prevention Plan 12/25/2019 11/27/2019  Transportation Screening Complete Complete  PCP or Specialist Appt within 3-5 Days Not Complete -  Not Complete comments Awaiting results for MRI and tests to determine what the patient wants to do regarding his care -  [place] or [place] Complete Complete  Social Work Consult for [place] Planning/Counseling Complete Complete  Palliative Care Screening Complete Not Applicable  Medication Review Press photographer) - Complete  Some recent data might be hidden

## 2019-12-30 NOTE — Progress Notes (Signed)
Report called to Lamar of [facility] [employer] IV removed. Patients family members updated on transfer. Awaiting for EMS to transport patient.

## 2019-12-30 NOTE — Progress Notes (Signed)
Seen and examined at bedside, patient's family is at bedside as well. Sleepy this morning reporting of generalized pain. Vital signs remained stable except slight sinus bradycardia with heart rate in 50s We will discontinue metoprolol.  Add IV morphine as needed for severe pain along with p.o. oxycodone. Supportive care, maintain comfort medications.  Currently awaiting bed at hospice.  Discharge patient once hospice bed is available.  Discussed with RN  Gerlean Ren MD [facility]

## 2019-12-30 NOTE — Discharge Summary (Signed)
Physician Discharge Summary  ZACKARY MCKEONE UEA:[id number] DOB: 01/27/1931 DOA: 12/26/2019  PCP: Sharilyn Sites, MD  Admit date: 12/26/2019 Discharge date: 12/30/2019  Patient going to hospice  Recommendations for Outpatient Follow-up:  1. Patient going to hospice  Discharge Condition: Stable CODE STATUS: DNR Diet recommendation: Comfort  Brief/Interim Summary: 86 year old with history of ESRD on hemodialysis Monday Wednesday Friday, COPD, bronchoalveolar carcinoma with metastatic disease, renal cell carcinoma status post left nephrectomy, HTN, right greater trochanteric fracture, GERD came to the ED with his niece for generalized weakness left greater than right. On outpatient getting palliative chemo/radiation for brain metastases, on Decadron and Keppra. In the ED noted to be clinically dehydrated, tachycardic, tachypneic.  Patient was gently hydrated.  Due to his weakness repeat MRI of the brain was done which showed progression of vasogenic edema on the right posterior hemisphere secondary to metastatic disease.  There was also concerns of swelling around his left upper extremity fistula site therefore ultrasound was done which was negative for any acute pathology.  Given his advanced disease and also been on dialysis, goals of care were discussed with the patient and his healthcare power of attorney, niece.   Patient was transitioned to hospice. Metoprolol will stop due to bradycardia.  At this time advised comfort feeding.  Patient's other niece at bedside. Life expectancy typically less than 2 weeks of dialysis  There is no height or weight on file to calculate BMI.         Discharge Diagnoses:  Active Problems:   Metastatic non-small cell lung cancer ([place])   Subjective: Patient does not have new complaints this morning.  Discharge Exam: Vitals:   12/30/19 0725 12/30/19 1002  BP:  134/78  Pulse:  (!) 54  Resp:    Temp:    SpO2: 90% 92%   Vitals:   12/29/19 1917  12/30/19 0720 12/30/19 0725 12/30/19 1002  BP:    134/78  Pulse:    (!) 54  Resp:      Temp:      TempSrc:      SpO2: (!) 88% 90% 90% 92%    General: Pt is alert, awake, not in acute distress, elderly frail cachectic. Generally ill-appearing. Cardiovascular: RRR, S1/S2 +, no rubs, no gallops Respiratory: CTA bilaterally, no wheezing, no rhonchi Abdominal: Soft, NT, ND, bowel sounds + Extremities: no edema, no cyanosis  Discharge Instructions  Discharge Instructions    Discharge patient   Complete by: As directed    When bed available.   Discharge disposition: 51-Hospice/Medical Facility   Discharge patient date: 12/28/2019     Allergies as of 12/30/2019   No Known Allergies     Medication List    STOP taking these medications   metoprolol succinate 25 MG 24 hr tablet Commonly known as: TOPROL-XL     TAKE these medications   acetaminophen 325 MG tablet Commonly known as: TYLENOL Take 2 tablets (650 mg total) by mouth every 6 (six) hours as needed for mild pain or moderate pain.   ALPRAZolam 0.5 MG tablet Commonly known as: Xanax Take 1 tablet (0.5 mg total) by mouth 3 (three) times daily as needed for sleep or anxiety.   cephALEXin 500 MG capsule Commonly known as: KEFLEX Take 1 capsule (500 mg total) by mouth 4 (four) times daily for 5 days.   Fluticasone-Salmeterol 100-50 MCG/DOSE Aepb Commonly known as: ADVAIR Inhale 1 puff into the lungs in the morning and at bedtime.   levETIRAcetam 500 MG tablet Commonly known  as: KEPPRA Take 1 tablet (500 mg total) by mouth 2 (two) times daily.   lidocaine-prilocaine cream Commonly known as: EMLA Apply 1 application topically every Monday, Wednesday, and Friday with hemodialysis.   omeprazole 20 MG capsule Commonly known as: PRILOSEC Take 20 mg by mouth daily.   oxycodone 5 MG capsule Commonly known as: OXY-IR Take 1 capsule (5 mg total) by mouth every 4 (four) hours as needed for pain.   Sennosides-Docusate  Sodium 8.6-50 MG Caps Take 1 capsule by mouth in the morning and at bedtime.   Ventolin HFA 108 (90 Base) MCG/ACT inhaler Generic drug: albuterol Inhale 2 puffs into the lungs every 4 (four) hours as needed for wheezing or shortness of breath.       No Known Allergies  You were cared for by a hospitalist during your hospital stay. If you have any questions about your discharge medications or the care you received while you were in the hospital after you are discharged, you can call the unit and asked to speak with the hospitalist on call if the hospitalist that took care of you is not available. Once you are discharged, your primary care physician will handle any further medical issues. Please note that no refills for any discharge medications will be authorized once you are discharged, as it is imperative that you return to your primary care physician (or establish a relationship with a primary care physician if you do not have one) for your aftercare needs so that they can reassess your need for medications and monitor your lab values.   Procedures/Studies: DG Chest 2 View  Result Date: 12/24/2019 CLINICAL DATA:  86 year old male with weakness and fatigue. EXAM: CHEST - 2 VIEW COMPARISON:  Chest radiograph dated 05/30/2017. FINDINGS: There is a small left pleural effusion and left lung base patchy opacity which may represent atelectasis but concerning for pneumonia. Clinical correlation recommended. The right lung is clear. No pneumothorax. Stable cardiac silhouette. Atherosclerotic calcification of the aorta. No acute osseous pathology. Degenerative changes of the spine. IMPRESSION: Small left pleural effusion and left lung base patchy opacity concerning for pneumonia. Electronically Signed   By: Anner Crete M.D.   On: 12/24/2019 18:53   CT Head Wo Contrast  Result Date: 12/24/2019 CLINICAL DATA:  Has history of brain cancer and had a treatment 2 weeks ago, according to daughter he has  been getting weaker on the left side, he had pain down his left side to his buttock. Patient took an oxycodone at 1am and it relieved his pain. Patient took another before dialysis. Getting into car patient was unable to lift his left leg on his own. EXAM: CT HEAD WITHOUT CONTRAST TECHNIQUE: Contiguous axial images were obtained from the base of the skull through the vertex without intravenous contrast. COMPARISON:  11/21/2019, head CT and brain MRI. Brain MRI, 11/27/2019. FINDINGS: Brain: Low-attenuation reflecting vasogenic edema extends throughout the right parietal lobe involving the adjacent posterior right frontal lobe and involves a smaller area of the left frontal lobe. This is unchanged compared to the prior head CT. There is slight, 1-2 mm, midline shift of the posterosuperior falx cerebri, also stable. The metastatic lesions which were noted in the right parietal lobe, left frontal lobe and superior right cerebellum are not defined on CT. There are no new areas of vasogenic edema to suggest new metastatic lesions. There is no hydrocephalus. There is no evidence of acute/recent infarction. There are no extra-axial masses or abnormal fluid collections. There is no  intracranial hemorrhage. Additional white matter hypoattenuation is noted bilaterally consistent with chronic microvascular ischemic change. Vascular: No hyperdense vessel or unexpected calcification. Skull: Normal. Negative for fracture or focal lesion. Sinuses/Orbits: Globes and orbits are unremarkable. The visualized sinuses show mild maxillary sinus mucosal thickening but are otherwise clear. Other: None. IMPRESSION: 1. No interval change in the head CT appearance from the study dated 11/21/2019. The right parietal and left frontal lobe metastatic lesions are not defined, but the significant vasogenic edema is well depicted and appears unchanged in extent. There is minimal stable mass effect from the right parietal lesion slightly deviating  the superior falx cerebri to the left. Electronically Signed   By: Lajean Manes M.D.   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 Reportedly undergoing radiation treatment - although no definite evidence of SRS treatment in the EMR [facility] was recommended by radiation oncology last month). Neurologic deficit.  Dialysis patient. EXAM: MRI HEAD WITHOUT CONTRAST TECHNIQUE: Multiplanar, multiecho pulse sequences of the brain and surrounding structures were obtained without intravenous contrast. COMPARISON:  SRS treatment planning brain MRI 11/27/2019. FINDINGS: Brain: No restricted diffusion suggestive of acute infarction. Metastatic disease related vasogenic edema in the anterior left frontal lobe has not significantly changed since 11/27/2019. However, edema has mildly increased in the posterior right hemisphere related to a parietal lobe metastasis (series 12, image 25 today with the primary mass faintly evident on series 10, image 22). Small right cerebellar mass remain stable without significant edema. No intracranial mass effect. No midline shift and basilar cisterns remain normal. Stable ventricle size and configuration. Occasional chronic micro hemorrhages again noted. No acute intracranial hemorrhage identified. Negative pituitary and cervicomedullary junction. Additional nonspecific cerebral white matter and deep gray nuclei T2 and FLAIR heterogeneity is stable. 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 Vascular: Major intracranial vascular flow voids are stable. Skull and upper cervical spine: Stable and negative for age visible cervical spine. Visualized bone marrow signal is within normal limits. Sinuses/Orbits: Stable, negative. Other: Mastoids remain well pneumatized. Scalp and face appear negative. IMPRESSION: 1. Mildly progressed vasogenic edema in the posterior right hemisphere related  to the dominant brain metastasis demonstrated on 11/27/2019. 2. Stable edema in the anterior left frontal lobe at the site of two smaller metastases. No new areas of cerebral edema. 3. No evidence of acute infarct. Electronically Signed   By: Genevie Ann M.D.   On: 12/25/2019 15:53   US Carotid Bilateral  Result Date: 12/25/2019 CLINICAL DATA:  Hypertension, stroke symptoms EXAM: BILATERAL CAROTID DUPLEX ULTRASOUND TECHNIQUE: Pearline Cables scale imaging, color Doppler and duplex ultrasound were performed of bilateral carotid and vertebral arteries in the neck. COMPARISON:  None. FINDINGS: Criteria: Quantification of carotid stenosis is based on velocity parameters that correlate the residual internal carotid diameter with NASCET-based stenosis levels, using the diameter of the distal internal carotid lumen as the denominator for stenosis measurement. The following velocity measurements were obtained: RIGHT ICA: 54/11 cm/sec CCA: 54/0 cm/sec SYSTOLIC ICA/CCA RATIO:  0.7 ECA: 55 cm/sec LEFT ICA: 51/12 cm/sec CCA: 98/11 cm/sec SYSTOLIC ICA/CCA RATIO:  0.6 ECA: 62 cm/sec RIGHT CAROTID ARTERY: Minor echogenic shadowing plaque formation. No hemodynamically significant right ICA stenosis, velocity elevation, or turbulent flow. Degree of narrowing less than 50%. RIGHT VERTEBRAL ARTERY:  Normal antegrade flow LEFT CAROTID ARTERY: Similar scattered minor echogenic plaque formation. No hemodynamically significant left ICA stenosis, velocity elevation, or  turbulent flow. LEFT VERTEBRAL ARTERY:  Normal antegrade flow IMPRESSION: Minor carotid atherosclerosis. No hemodynamically significant ICA stenosis. Degree of narrowing less than 50% bilaterally by ultrasound criteria. Patent antegrade vertebral flow bilaterally Electronically Signed   By: Jerilynn Mages.  Shick M.D.   On: 12/25/2019 16:08   US Venous Img Upper Uni Left (DVT)  Result Date: 12/25/2019 CLINICAL DATA:  Left upper extremity edema. History of end-stage renal disease with left upper  arm fistula. EXAM: LEFT UPPER EXTREMITY VENOUS DOPPLER ULTRASOUND TECHNIQUE: Gray-scale sonography with graded compression, as well as color Doppler and duplex ultrasound were performed to evaluate the upper extremity deep venous system from the level of the subclavian vein and including the jugular, axillary, basilic, radial, ulnar and upper cephalic vein. Spectral Doppler was utilized to evaluate flow at rest and with distal augmentation maneuvers. COMPARISON:  None. FINDINGS: Contralateral Subclavian Vein: Respiratory phasicity is normal and symmetric with the symptomatic side. No evidence of thrombus. Normal compressibility. Internal Jugular Vein: No evidence of thrombus. Normal compressibility, respiratory phasicity and response to augmentation. Subclavian Vein: No evidence of thrombus. Normal compressibility, respiratory phasicity and response to augmentation. Axillary Vein: No evidence of thrombus. Normal compressibility, respiratory phasicity and response to augmentation. Cephalic Vein: No evidence of thrombus. Normal compressibility, respiratory phasicity and response to augmentation. Basilic Vein: No evidence of thrombus. Normal compressibility, respiratory phasicity and response to augmentation. Brachial Veins: No evidence of thrombus. Normal compressibility, respiratory phasicity and response to augmentation. Radial Veins: No evidence of thrombus. Normal compressibility, respiratory phasicity and response to augmentation. Ulnar Veins: No evidence of thrombus. Normal compressibility, respiratory phasicity and response to augmentation. Venous Reflux:  None visualized. Other Findings: No evidence of superficial thrombophlebitis. Image portion of a left upper arm AV fistula appears patent. Formal evaluation of the entire fistula was not performed. IMPRESSION: No evidence of DVT within the left upper extremity. Electronically Signed   By: Aletta Edouard M.D.   On: 12/25/2019 12:46   ECHOCARDIOGRAM  COMPLETE  Result Date: 12/25/2019    ECHOCARDIOGRAM REPORT   Patient Name:   DAMARKO STITELY Mccown Date of Exam: 12/25/2019 Medical Rec #:  [id number]      Height:       66.0 in Accession #:    [id number]     Weight:       180.0 lb Date of Birth:  January 07, 1931       BSA:          1.912 m Patient Age:    60 years       BP:           138/64 mmHg Patient Gender: M              HR:           102 bpm. Exam Location:  [facility] Procedure: 2D Echo Indications:    Stroke 434.91 / I163.9  History:        Patient has prior history of Echocardiogram examinations, most                 recent 01/05/2018. COPD, Arrythmias:Atrial Fibrillation; Risk                 Factors:Hypertension. ESRD, GERD, Bronchoalveolar carcinoma ,                 Metastasis to brain.  Sonographer:    Leavy Cella RDCS (AE) Referring Phys: [id number] OLADAPO ADEFESO IMPRESSIONS  1. Left ventricular ejection fraction, by estimation, is 60 to 65%.  The left ventricle has normal function. The left ventricle has no regional wall motion abnormalities. There is moderate left ventricular hypertrophy. Left ventricular diastolic parameters are indeterminate.  2. Right ventricular systolic function is normal. The right ventricular size is normal. There is mildly elevated pulmonary artery systolic pressure. The estimated right ventricular systolic pressure is 25.3 mmHg.  3. Left atrial size was mild to moderately dilated.  4. Right atrial size was upper normal.  5. The mitral valve is grossly normal. Trivial mitral valve regurgitation.  6. The aortic valve is tricuspid. Aortic valve regurgitation is trivial. Mild aortic valve sclerosis is present, with no evidence of aortic valve stenosis.  7. The inferior vena cava is normal in size with greater than 50% respiratory variability, suggesting right atrial pressure of 3 mmHg. FINDINGS  Left Ventricle: Left ventricular ejection fraction, by estimation, is 60 to 65%. The left ventricle has normal function. The left ventricle has  no regional wall motion abnormalities. The left ventricular internal cavity size was normal in size. There is  moderate left ventricular hypertrophy. Left ventricular diastolic parameters are indeterminate. Right Ventricle: The right ventricular size is normal. No increase in right ventricular wall thickness. Right ventricular systolic function is normal. There is mildly elevated pulmonary artery systolic pressure. The tricuspid regurgitant velocity is 3.00  m/s, and with an assumed right atrial pressure of 3 mmHg, the estimated right ventricular systolic pressure is 66.4 mmHg. Left Atrium: Left atrial size was mild to moderately dilated. Right Atrium: Right atrial size was upper normal. Pericardium: There is no evidence of pericardial effusion. Presence of pericardial fat pad. Mitral Valve: The mitral valve is grossly normal. Mild mitral annular calcification. Trivial mitral valve regurgitation. Tricuspid Valve: The tricuspid valve is grossly normal. Tricuspid valve regurgitation is trivial. Aortic Valve: The aortic valve is tricuspid. Aortic valve regurgitation is trivial. Mild aortic valve sclerosis is present, with no evidence of aortic valve stenosis. Mild aortic valve annular calcification. There is mild calcification of the aortic valve. Pulmonic Valve: The pulmonic valve was grossly normal. Pulmonic valve regurgitation is trivial. Aorta: The aortic root is normal in size and structure. Venous: The inferior vena cava is normal in size with greater than 50% respiratory variability, suggesting right atrial pressure of 3 mmHg. IAS/Shunts: No atrial level shunt detected by color flow Doppler.  LEFT VENTRICLE PLAX 2D LVIDd:         3.37 cm  Diastology LVIDs:         1.84 cm  LV e' lateral:   14.60 cm/s LV PW:         1.56 cm  LV E/e' lateral: 8.2 LV IVS:        1.75 cm  LV e' medial:    9.14 cm/s LVOT diam:     2.00 cm  LV E/e' medial:  13.1 LVOT Area:     3.14 cm  RIGHT VENTRICLE RV S prime:     25.00 cm/s TAPSE  (M-mode): 2.4 cm LEFT ATRIUM             Index       RIGHT ATRIUM           Index LA diam:        2.80 cm 1.46 cm/m  RA Area:     21.80 cm LA Vol (A2C):   97.7 ml 51.09 ml/m RA Volume:   63.30 ml  33.10 ml/m LA Vol (A4C):   73.4 ml 38.38 ml/m LA Biplane Vol: 87.9 ml 45.97  ml/m   AORTA Ao Root diam: 3.60 cm MITRAL VALVE                TRICUSPID VALVE MV Area (PHT): 4.18 cm     TR Peak grad:   36.0 mmHg MV Decel Time: 181 msec     TR Vmax:        300.00 cm/s MV E velocity: 119.67 cm/s                             SHUNTS                             Systemic Diam: 2.00 cm Rozann Lesches MD Electronically signed by Rozann Lesches MD Signature Date/Time: 12/25/2019/11:37:35 AM    Final       The results of significant diagnostics from this hospitalization (including imaging, microbiology, ancillary and laboratory) are listed below for reference.     Microbiology: Recent Results (from the past 240 hour(s))  SARS CORONAVIRUS 2 (TAT 6-24 HRS) Nasopharyngeal Nasopharyngeal Swab     Status: None   Collection Time: 12/24/19  7:08 PM   Specimen: Nasopharyngeal Swab  Result Value Ref Range Status   SARS Coronavirus 2 NEGATIVE NEGATIVE Final    Comment: (NOTE) SARS-CoV-2 target nucleic acids are NOT DETECTED.  The SARS-CoV-2 RNA is generally detectable in upper and lower respiratory specimens during the acute phase of infection. Negative results do not preclude SARS-CoV-2 infection, do not rule out co-infections with other pathogens, and should not be used as the sole basis for treatment or other patient management decisions. Negative results must be combined with clinical observations, patient history, and epidemiological information. The expected result is Negative.  Fact Sheet for Patients: SugarRoll.be  Fact Sheet for Healthcare Providers: https://www.woods-mathews.com/  This test is not yet approved or cleared by the [place] FDA and  has  been authorized for detection and/or diagnosis of SARS-CoV-2 by FDA under an Emergency Use Authorization (EUA). This EUA will remain  in effect (meaning this test can be used) for the duration of the COVID-19 declaration under Se ction 564(b)(1) of the Act, 21 U.S.C. section 360bbb-3(b)(1), unless the authorization is terminated or revoked sooner.  Performed at [facility], [place] [address] [place], [place] [address]      Labs: BNP (last 3 results) No results for input(s): BNP in the last 8760 hours. Basic Metabolic Panel: Recent Labs  Lab 12/24/19 1516 12/25/19 0549 12/26/19 0612  NA 135 133* 130*  K 4.9 4.8 5.8*  CL 98 96* 96*  CO2 24 24 21*  GLUCOSE 120* 115* 180*  BUN 59* 69* 94*  CREATININE 5.51* 6.58* 8.19*  CALCIUM 7.7* 7.9* 8.1*  MG  --  2.1 2.2  PHOS  --  5.8*  --    Liver Function Tests: Recent Labs  Lab 12/24/19 1516 12/25/19 0549  AST 13* 9*  ALT 19 17  ALKPHOS 76 70  BILITOT 0.8 0.8  PROT 5.9* 5.5*  ALBUMIN 3.0* 2.8*   Recent Labs  Lab 12/24/19 1516  LIPASE 69*   No results for input(s): AMMONIA in the last 168 hours. CBC: Recent Labs  Lab 12/24/19 1516 12/25/19 0549 12/26/19 0612  WBC 5.5 5.1 4.4  NEUTROABS 4.5  --   --   HGB 11.3* 10.7* 9.3*  HCT 35.0* 34.3* 29.1*  MCV 102.0* 104.9* 101.4*  PLT 84* 53*  68*   Cardiac Enzymes: No results for input(s): CKTOTAL, CKMB, CKMBINDEX, TROPONINI in the last 168 hours. BNP: Invalid input(s): POCBNP CBG: Recent Labs  Lab 12/24/19 1515  GLUCAP 120*   D-Dimer No results for input(s): DDIMER in the last 72 hours. Hgb A1c No results for input(s): HGBA1C in the last 72 hours. Lipid Profile No results for input(s): CHOL, HDL, LDLCALC, TRIG, CHOLHDL, LDLDIRECT in the last 72 hours. Thyroid function studies No results for input(s): TSH, T4TOTAL, T3FREE, THYROIDAB in the last 72 hours.  Invalid input(s): FREET3 Anemia work up No results for input(s): VITAMINB12, FOLATE, FERRITIN,  TIBC, IRON, RETICCTPCT in the last 72 hours. Urinalysis    Component Value Date/Time   COLORURINE YELLOW 12/24/2019 1536   APPEARANCEUR HAZY (A) 12/24/2019 1536   LABSPEC 1.013 12/24/2019 1536   PHURINE 6.0 12/24/2019 1536   GLUCOSEU 50 (A) 12/24/2019 1536   HGBUR LARGE (A) 12/24/2019 1536   BILIRUBINUR NEGATIVE 12/24/2019 1536   KETONESUR NEGATIVE 12/24/2019 1536   PROTEINUR 100 (A) 12/24/2019 1536   UROBILINOGEN 0.2 01/01/2009 1054   NITRITE NEGATIVE 12/24/2019 1536   LEUKOCYTESUR LARGE (A) 12/24/2019 1536   Sepsis Labs Invalid input(s): PROCALCITONIN,  WBC,  LACTICIDVEN Microbiology Recent Results (from the past 240 hour(s))  SARS CORONAVIRUS 2 (TAT 6-24 HRS) Nasopharyngeal Nasopharyngeal Swab     Status: None   Collection Time: 12/24/19  7:08 PM   Specimen: Nasopharyngeal Swab  Result Value Ref Range Status   SARS Coronavirus 2 NEGATIVE NEGATIVE Final    Comment: (NOTE) SARS-CoV-2 target nucleic acids are NOT DETECTED.  The SARS-CoV-2 RNA is generally detectable in upper and lower respiratory specimens during the acute phase of infection. Negative results do not preclude SARS-CoV-2 infection, do not rule out co-infections with other pathogens, and should not be used as the sole basis for treatment or other patient management decisions. Negative results must be combined with clinical observations, patient history, and epidemiological information. The expected result is Negative.  Fact Sheet for Patients: SugarRoll.be  Fact Sheet for Healthcare Providers: https://www.woods-mathews.com/  This test is not yet approved or cleared by the [place] FDA and  has been authorized for detection and/or diagnosis of SARS-CoV-2 by FDA under an Emergency Use Authorization (EUA). This EUA will remain  in effect (meaning this test can be used) for the duration of the COVID-19 declaration under Se ction 564(b)(1) of the Act, 21  U.S.C. section 360bbb-3(b)(1), unless the authorization is terminated or revoked sooner.  Performed at [facility], [place] [address], [place], [place] [address]      Time coordinating discharge:  I have spent 35 minutes face to face with the patient and on the ward discussing the patients care, assessment, plan and disposition with other care givers. >50% of the time was devoted counseling the patient about the risks and benefits of treatment/Discharge disposition and coordinating care.   SIGNED:   Damita Lack, MD  [facility] 12/30/2019, 11:42 AM   If 7PM-7AM, please contact night-coverage

## 2019-12-30 NOTE — Progress Notes (Signed)
  Radiation Oncology         ([phone] ________________________________  Name: [name] MRN: [id number]  Date: 12/11/2019  DOB: 10-20-1930  End of Treatment Note  Diagnosis:   Brain metastasis     Indication for treatment:  palliative       Radiation treatment dates:   12/11/19  Site/dose:    20 Gy/ 1 fraction ExacTrac, 6 vmat beams Max dose=131.2% PTV1 Rt Post Parietal 66mm PTV2 Lt Frontal 78mm PTV3 Lt Frontal 30mm PTV4 Rt sup Cerebellum 44mm  Narrative: The patient tolerated radiation treatment well.   There were no signs of acute toxicity after treatment.  Plan: The patient has completed radiation treatment. The patient will return to radiation oncology clinic for routine followup in one month. I advised the patient to call or return sooner if they have any questions or concerns related to their recovery or treatment. ________________________________  ------------------------------------------------  Jodelle Gross, MD, PhD

## 2019-12-31 ENCOUNTER — Inpatient Hospital Stay: Payer: Medicare Other

## 2020-01-01 ENCOUNTER — Telehealth: Payer: Self-pay

## 2020-01-01 NOTE — Telephone Encounter (Signed)
Attempted to reach pt to schedule for fistulogram. Spoke with pt's niece [name] who informed that pt passed on yesterday after deciding to stop dialysis. Condolences expressed to family.

## 2020-01-08 ENCOUNTER — Telehealth: Payer: Self-pay | Admitting: Oncology

## 2020-01-08 DIAGNOSIS — N186 End stage renal disease: Secondary | ICD-10-CM | POA: Diagnosis not present

## 2020-01-08 DIAGNOSIS — Z992 Dependence on renal dialysis: Secondary | ICD-10-CM | POA: Diagnosis not present

## 2020-01-09 DEATH — deceased

## 2021-02-23 IMAGING — CT CT HEAD W/O CM
3 series · 15 of 47 positions shown, 18 images · non-contrast
Comparison: 11/21/2019, head CT and brain MRI. Brain MRI,
11/27/2019.

CLINICAL DATA: Has history of brain cancer and had a treatment 2
weeks ago, according to daughter he has been getting weaker on the
left side, he had pain down his left side to his buttock. Patient
took an oxycodone at 1am and it relieved his pain. Patient took
another before dialysis. Getting into car patient was unable to lift
his left leg on his own.

EXAM:
CT HEAD WITHOUT CONTRAST
TECHNIQUE: Contiguous axial images were obtained from the base of the skull
through the vertex without intravenous contrast.

[Series 2: head w o · axial · 0.45mm/px · z∈[+11,+141]mm · 9 of 32 slices shown, 12 images]
[im 3/32  brain]
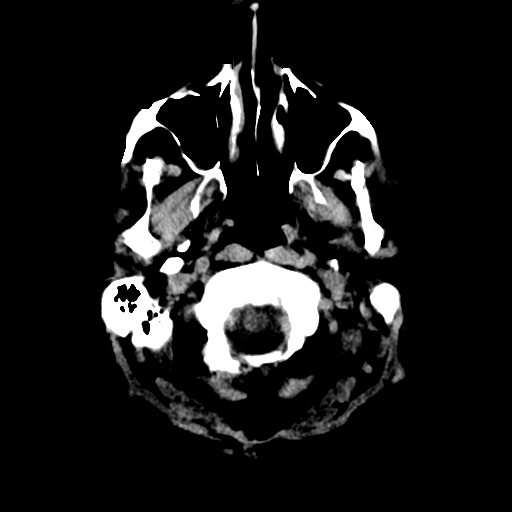
[im 3/32  bone]
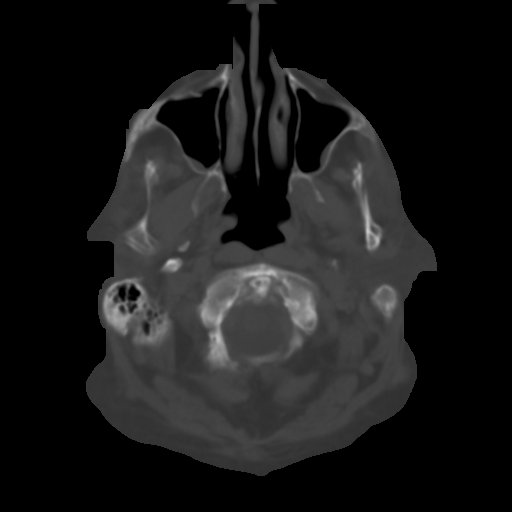
[im 6/32  brain]
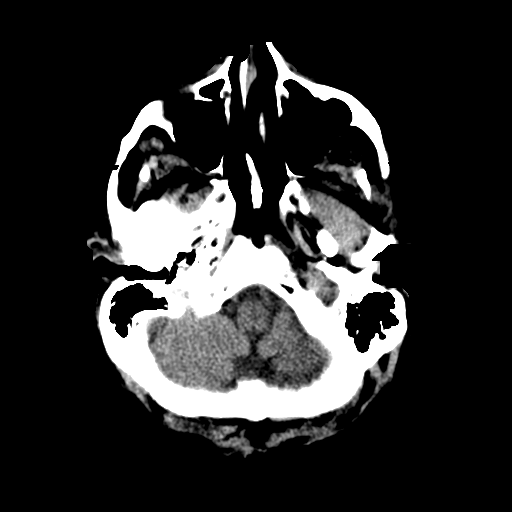
[im 9/32  brain]
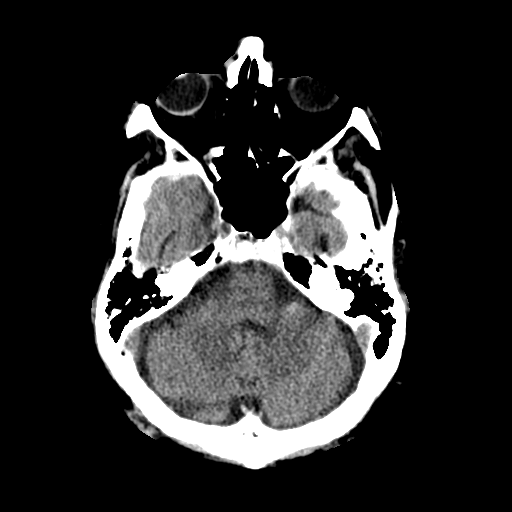
[im 12/32  brain]
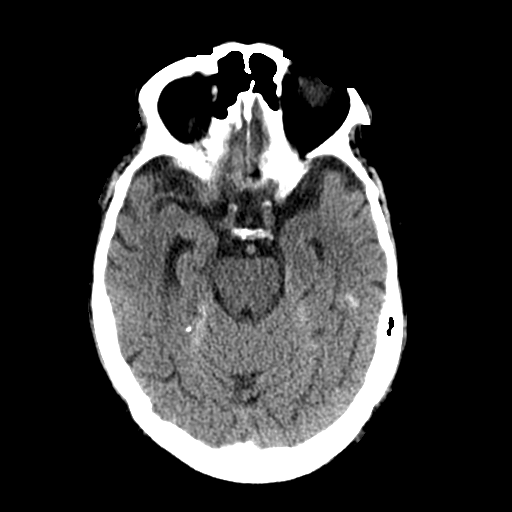
[im 17/32  brain]
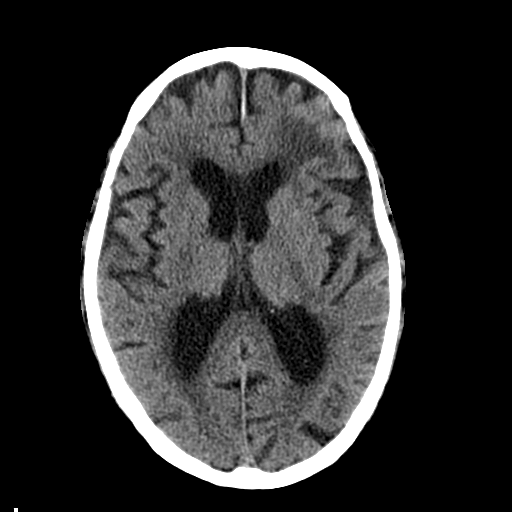
[im 17/32  bone]
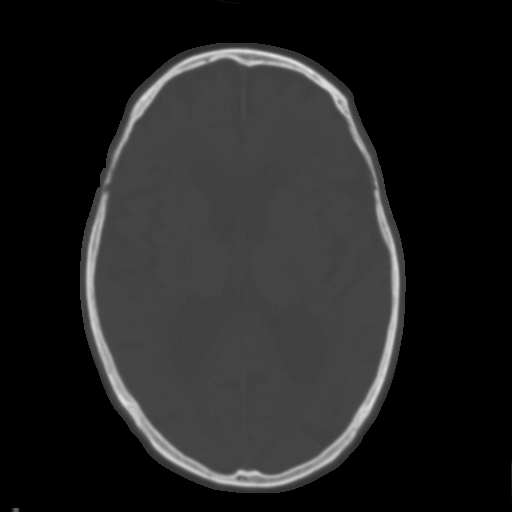
[im 20/32  brain]
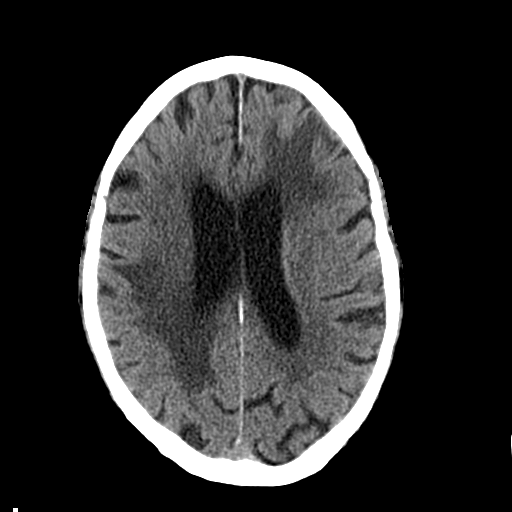
[im 23/32  brain]
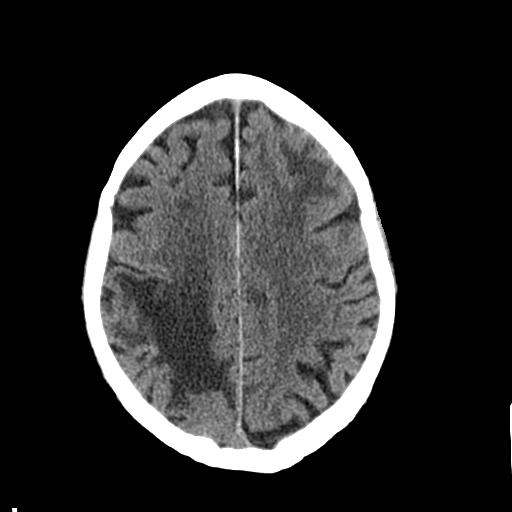
[im 26/32  brain]
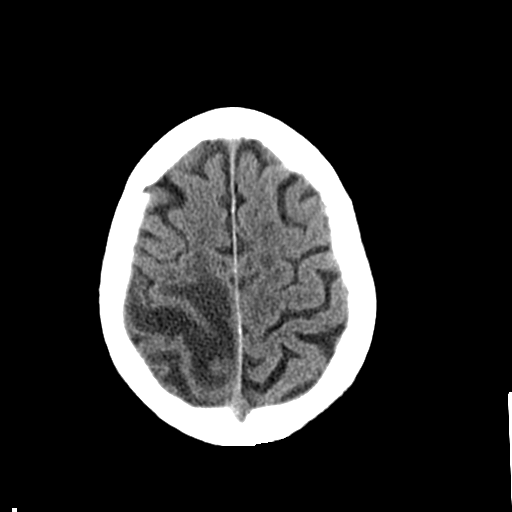
[im 29/32  brain]
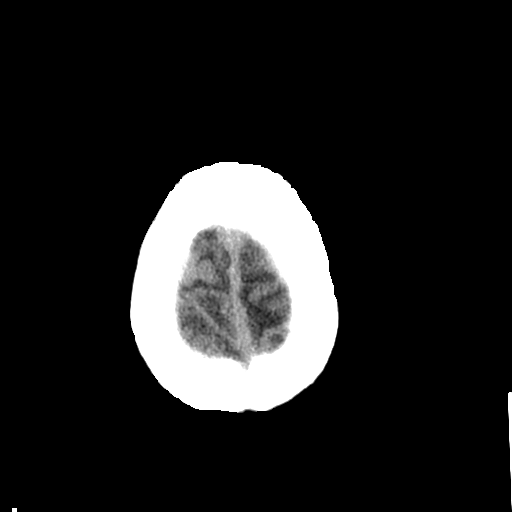
[im 29/32  bone]
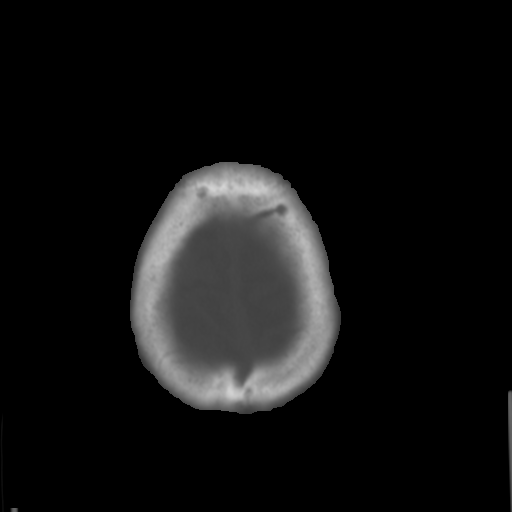

[Series 4: coronal soft · coronal · 0.35mm/px · 3 of 83 slices shown]
[im 28/83  brain]
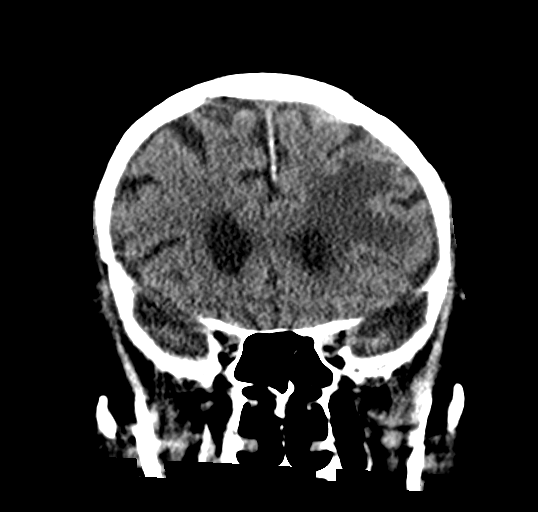
[im 37/83  brain]
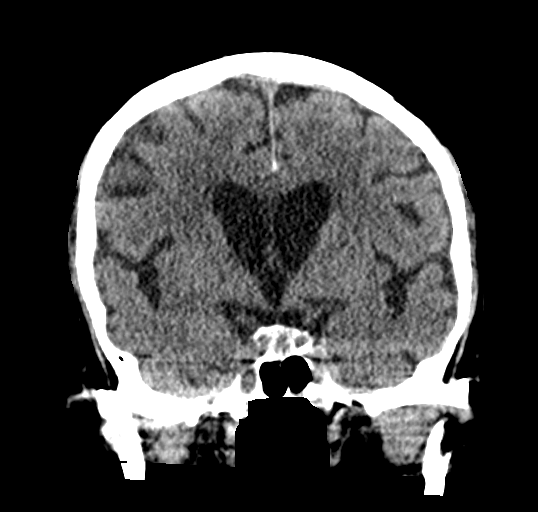
[im 46/83  brain]
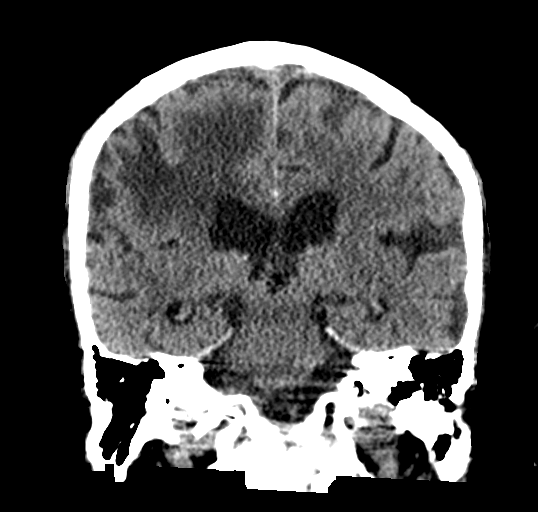

[Series 5: sagittal soft · sagittal · 0.34mm/px · 3 of 55 slices shown]
[im 19/55  brain]
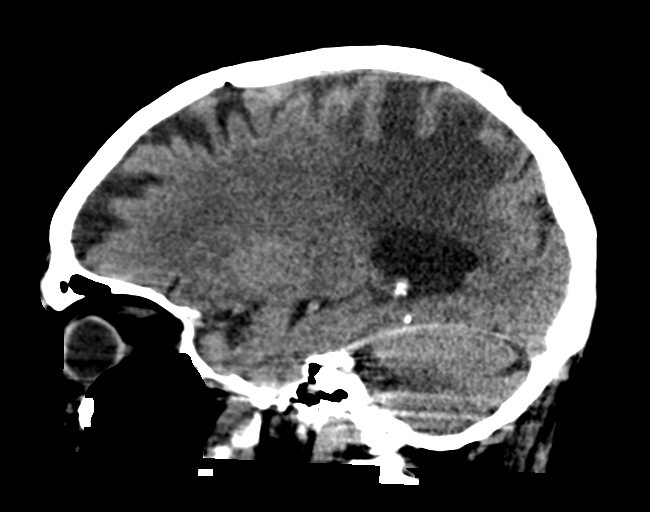
[im 28/55  brain]
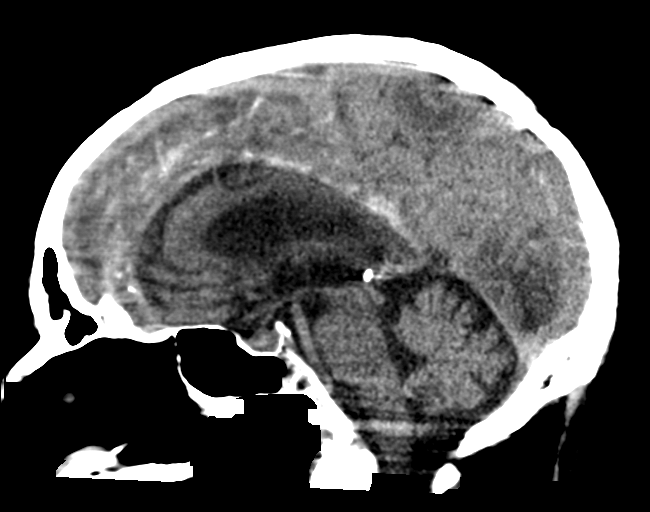
[im 37/55  brain]
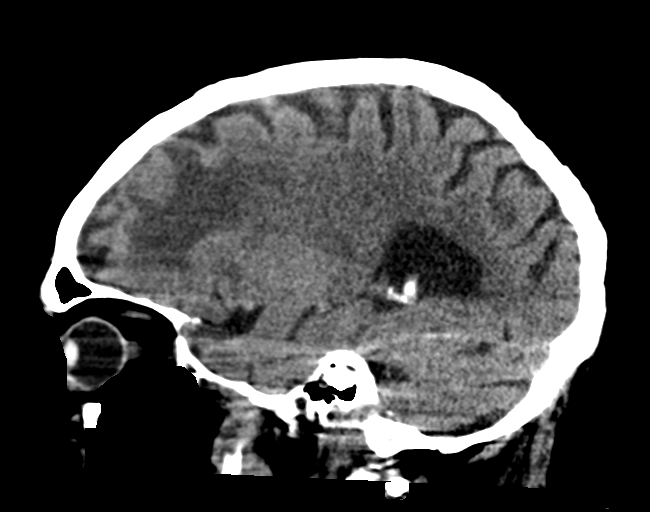

[15 of 47 positions shown; findings below may reference images not displayed]

FINDINGS: Brain: Low-attenuation reflecting vasogenic edema extends throughout
the right parietal lobe involving the adjacent posterior right
frontal lobe and involves a smaller area of the left frontal lobe.
This is unchanged compared to the prior head CT. There is slight,
1-2 mm, midline shift of the posterosuperior falx cerebri, also
stable. The metastatic lesions which were noted in the right
parietal lobe, left frontal lobe and superior right cerebellum are
not defined on CT. There are no new areas of vasogenic edema to
suggest new metastatic lesions.

There is no hydrocephalus. There is no evidence of acute/recent
infarction. There are no extra-axial masses or abnormal fluid
collections.

There is no intracranial hemorrhage.

Additional white matter hypoattenuation is noted bilaterally
consistent with chronic microvascular ischemic change.

Vascular: No hyperdense vessel or unexpected calcification.

Skull: Normal. Negative for fracture or focal lesion.

Sinuses/Orbits: Globes and orbits are unremarkable. The visualized
sinuses show mild maxillary sinus mucosal thickening but are
otherwise clear.

Other: None.
IMPRESSION: 1. No interval change in the head CT appearance from the study dated
11/21/2019. The right parietal and left frontal lobe metastatic
lesions are not defined, but the significant vasogenic edema is well
depicted and appears unchanged in extent. There is minimal stable
mass effect from the right parietal lesion slightly deviating the
superior falx cerebri to the left.
